# Patient Record
Sex: Female | Born: 1940 | Race: White | Hispanic: No | Smoking: Former smoker
Health system: Southern US, Community
[De-identification: ages and names within clinical notes are randomized; demographics above are authoritative.]

## PROBLEM LIST (undated history)

## (undated) DIAGNOSIS — M329 Systemic lupus erythematosus, unspecified: Secondary | ICD-10-CM

## (undated) DIAGNOSIS — J441 Chronic obstructive pulmonary disease with (acute) exacerbation: Secondary | ICD-10-CM

## (undated) DIAGNOSIS — E7800 Pure hypercholesterolemia, unspecified: Secondary | ICD-10-CM

## (undated) DIAGNOSIS — Z720 Tobacco use: Secondary | ICD-10-CM

## (undated) DIAGNOSIS — U071 COVID-19: Secondary | ICD-10-CM

## (undated) DIAGNOSIS — I10 Essential (primary) hypertension: Secondary | ICD-10-CM

## (undated) DIAGNOSIS — IMO0002 Reserved for concepts with insufficient information to code with codable children: Secondary | ICD-10-CM

## (undated) DIAGNOSIS — K5792 Diverticulitis of intestine, part unspecified, without perforation or abscess without bleeding: Secondary | ICD-10-CM

## (undated) DIAGNOSIS — M810 Age-related osteoporosis without current pathological fracture: Secondary | ICD-10-CM

## (undated) HISTORY — PX: OTHER SURGICAL HISTORY: SHX169

## (undated) HISTORY — DX: Chronic obstructive pulmonary disease with (acute) exacerbation: J44.1

## (undated) HISTORY — DX: Reserved for concepts with insufficient information to code with codable children: IMO0002

## (undated) HISTORY — DX: Essential (primary) hypertension: I10

## (undated) HISTORY — DX: Tobacco use: Z72.0

## (undated) HISTORY — DX: Age-related osteoporosis without current pathological fracture: M81.0

## (undated) HISTORY — DX: Diverticulitis of intestine, part unspecified, without perforation or abscess without bleeding: K57.92

## (undated) HISTORY — PX: CATARACT EXTRACTION: SUR2

## (undated) HISTORY — DX: Systemic lupus erythematosus, unspecified: M32.9

## (undated) HISTORY — DX: Pure hypercholesterolemia, unspecified: E78.00

---
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
## 1969-12-27 HISTORY — PX: ABDOMINAL HYSTERECTOMY: SHX81

## 1995-08-30 HISTORY — PX: CHOLECYSTECTOMY: SHX55

## 2001-07-27 ENCOUNTER — Emergency Department (HOSPITAL_COMMUNITY): Admission: EM | Admit: 2001-07-27 | Discharge: 2001-07-27 | Payer: Self-pay | Admitting: *Deleted

## 2011-02-08 ENCOUNTER — Ambulatory Visit: Payer: Self-pay | Admitting: Unknown Physician Specialty

## 2011-02-28 ENCOUNTER — Ambulatory Visit: Payer: Self-pay | Admitting: Unknown Physician Specialty

## 2011-03-08 ENCOUNTER — Ambulatory Visit: Payer: Self-pay | Admitting: Unknown Physician Specialty

## 2011-08-30 HISTORY — PX: LAPAROSCOPIC SIGMOID COLECTOMY: SHX5928

## 2012-02-17 ENCOUNTER — Inpatient Hospital Stay: Payer: Self-pay | Admitting: Surgery

## 2012-02-17 LAB — COMPREHENSIVE METABOLIC PANEL
Albumin: 3.3 g/dL — ABNORMAL LOW (ref 3.4–5.0)
Alkaline Phosphatase: 71 U/L (ref 50–136)
Anion Gap: 4 — ABNORMAL LOW (ref 7–16)
BUN: 11 mg/dL (ref 7–18)
Bilirubin,Total: 0.5 mg/dL (ref 0.2–1.0)
Calcium, Total: 8.7 mg/dL (ref 8.5–10.1)
Chloride: 99 mmol/L (ref 98–107)
Co2: 29 mmol/L (ref 21–32)
Creatinine: 0.78 mg/dL (ref 0.60–1.30)
EGFR (Non-African Amer.): >60
Glucose: 89 mg/dL (ref 65–99)
Potassium: 3.3 mmol/L — ABNORMAL LOW (ref 3.5–5.1)
SGPT (ALT): 17 U/L

## 2012-02-17 LAB — CBC
HGB: 13.7 g/dL (ref 12.0–16.0)
MCH: 32.3 pg (ref 26.0–34.0)
MCHC: 33.8 g/dL (ref 32.0–36.0)
Platelet: 343 10*3/uL (ref 150–440)
RBC: 4.24 10*6/uL (ref 3.80–5.20)
RDW: 13.1 % (ref 11.5–14.5)
WBC: 12 10*3/uL — ABNORMAL HIGH (ref 3.6–11.0)

## 2012-02-17 LAB — URINALYSIS, COMPLETE
Blood: NEGATIVE
Glucose,UR: NEGATIVE mg/dL (ref 0–75)
Ketone: NEGATIVE
Nitrite: POSITIVE
Ph: 5 (ref 4.5–8.0)
Specific Gravity: 1.028 (ref 1.003–1.030)
Squamous Epithelial: 4
WBC UR: 3 /HPF (ref 0–5)

## 2012-02-17 LAB — LIPASE, BLOOD: Lipase: 61 U/L — ABNORMAL LOW (ref 73–393)

## 2012-02-18 LAB — CBC WITH DIFFERENTIAL/PLATELET
Basophil #: 0.1 10*3/uL (ref 0.0–0.1)
HCT: 32.3 % — ABNORMAL LOW (ref 35.0–47.0)
HGB: 11.3 g/dL — ABNORMAL LOW (ref 12.0–16.0)
Lymphocyte %: 17.4 %
MCH: 33 pg (ref 26.0–34.0)
MCHC: 35 g/dL (ref 32.0–36.0)
MCV: 94 fL (ref 80–100)
Monocyte %: 9.2 %
Neutrophil %: 71.8 %
RDW: 13 % (ref 11.5–14.5)
WBC: 10.5 10*3/uL (ref 3.6–11.0)

## 2012-02-18 LAB — BASIC METABOLIC PANEL
Anion Gap: 7 (ref 7–16)
Co2: 24 mmol/L (ref 21–32)
Creatinine: 0.6 mg/dL (ref 0.60–1.30)
EGFR (Non-African Amer.): >60
Glucose: 72 mg/dL (ref 65–99)
Sodium: 131 mmol/L — ABNORMAL LOW (ref 136–145)

## 2012-02-18 LAB — PROTIME-INR
INR: 0.9
Prothrombin Time: 12.5 secs (ref 11.5–14.7)

## 2012-02-20 LAB — CBC WITH DIFFERENTIAL/PLATELET
Eosinophil #: 0.2 10*3/uL (ref 0.0–0.7)
HCT: 33.4 % — ABNORMAL LOW (ref 35.0–47.0)
HGB: 11.6 g/dL — ABNORMAL LOW (ref 12.0–16.0)
Lymphocyte #: 1.4 10*3/uL (ref 1.0–3.6)
Lymphocyte %: 17.1 %
MCH: 32.7 pg (ref 26.0–34.0)
Monocyte #: 0.9 x10 3/mm (ref 0.2–0.9)
Monocyte %: 11 %
Neutrophil %: 69.2 %
RBC: 3.55 10*6/uL — ABNORMAL LOW (ref 3.80–5.20)
WBC: 8.4 10*3/uL (ref 3.6–11.0)

## 2012-02-20 LAB — BASIC METABOLIC PANEL
Calcium, Total: 8.4 mg/dL — ABNORMAL LOW (ref 8.5–10.1)
Creatinine: 0.62 mg/dL (ref 0.60–1.30)
EGFR (African American): >60
EGFR (Non-African Amer.): >60
Glucose: 89 mg/dL (ref 65–99)
Potassium: 2.9 mmol/L — ABNORMAL LOW (ref 3.5–5.1)
Sodium: 134 mmol/L — ABNORMAL LOW (ref 136–145)

## 2012-02-22 LAB — BASIC METABOLIC PANEL
Anion Gap: 10 (ref 7–16)
Creatinine: 0.65 mg/dL (ref 0.60–1.30)
EGFR (African American): >60
EGFR (Non-African Amer.): >60
Osmolality: 268 (ref 275–301)
Sodium: 136 mmol/L (ref 136–145)

## 2012-02-22 LAB — CBC WITH DIFFERENTIAL/PLATELET
Basophil #: 0.1 10*3/uL (ref 0.0–0.1)
Basophil %: 0.7 %
Eosinophil #: 0.2 10*3/uL (ref 0.0–0.7)
Eosinophil %: 2.4 %
HGB: 11.4 g/dL — ABNORMAL LOW (ref 12.0–16.0)
Lymphocyte #: 1.4 10*3/uL (ref 1.0–3.6)
MCH: 32.2 pg (ref 26.0–34.0)
MCHC: 34.1 g/dL (ref 32.0–36.0)
MCV: 95 fL (ref 80–100)
Monocyte %: 11.6 %
Neutrophil %: 70.9 %
RBC: 3.52 10*6/uL — ABNORMAL LOW (ref 3.80–5.20)
RDW: 13.3 % (ref 11.5–14.5)

## 2012-03-08 ENCOUNTER — Ambulatory Visit: Payer: Self-pay | Admitting: Surgery

## 2012-04-03 ENCOUNTER — Ambulatory Visit: Payer: Self-pay | Admitting: Surgery

## 2012-04-03 LAB — CBC WITH DIFFERENTIAL/PLATELET
Basophil #: 0.1 10*3/uL (ref 0.0–0.1)
Basophil %: 1 %
Eosinophil %: 0.6 %
HCT: 41.3 % (ref 35.0–47.0)
HGB: 14.3 g/dL (ref 12.0–16.0)
Lymphocyte #: 2 10*3/uL (ref 1.0–3.6)
Lymphocyte %: 28.8 %
MCH: 33 pg (ref 26.0–34.0)
MCHC: 34.7 g/dL (ref 32.0–36.0)
MCV: 95 fL (ref 80–100)
Monocyte %: 11.8 %
Neutrophil #: 4 10*3/uL (ref 1.4–6.5)
Neutrophil %: 57.8 %
RBC: 4.35 10*6/uL (ref 3.80–5.20)
WBC: 7 10*3/uL (ref 3.6–11.0)

## 2012-04-03 LAB — BASIC METABOLIC PANEL
Calcium, Total: 9.7 mg/dL (ref 8.5–10.1)
Chloride: 98 mmol/L (ref 98–107)
Co2: 28 mmol/L (ref 21–32)
Creatinine: 0.59 mg/dL — ABNORMAL LOW (ref 0.60–1.30)
EGFR (Non-African Amer.): >60
Glucose: 81 mg/dL (ref 65–99)
Potassium: 3.3 mmol/L — ABNORMAL LOW (ref 3.5–5.1)
Sodium: 135 mmol/L — ABNORMAL LOW (ref 136–145)

## 2012-04-05 ENCOUNTER — Ambulatory Visit: Payer: Self-pay | Admitting: Surgery

## 2012-04-05 LAB — HEPATIC FUNCTION PANEL A (ARMC)
Albumin: 3.2 g/dL — ABNORMAL LOW (ref 3.4–5.0)
Alkaline Phosphatase: 54 U/L (ref 50–136)
Bilirubin, Direct: 0.1 mg/dL (ref 0.00–0.20)
Bilirubin,Total: 0.4 mg/dL (ref 0.2–1.0)
SGPT (ALT): 23 U/L (ref 12–78)
Total Protein: 8.3 g/dL — ABNORMAL HIGH (ref 6.4–8.2)

## 2012-04-17 ENCOUNTER — Inpatient Hospital Stay: Payer: Self-pay | Admitting: Surgery

## 2012-04-17 LAB — POTASSIUM: Potassium: 2.9 mmol/L — ABNORMAL LOW (ref 3.5–5.1)

## 2012-04-18 LAB — CBC WITH DIFFERENTIAL/PLATELET
Basophil #: 0 10*3/uL (ref 0.0–0.1)
Eosinophil #: 0 10*3/uL (ref 0.0–0.7)
Lymphocyte #: 1.3 10*3/uL (ref 1.0–3.6)
MCH: 32.5 pg (ref 26.0–34.0)
MCHC: 34.5 g/dL (ref 32.0–36.0)
MCV: 94 fL (ref 80–100)
Monocyte #: 1.4 x10 3/mm — ABNORMAL HIGH (ref 0.2–0.9)
Neutrophil #: 16 10*3/uL — ABNORMAL HIGH (ref 1.4–6.5)
Neutrophil %: 85.3 %
Platelet: 250 10*3/uL (ref 150–440)
RDW: 14.5 % (ref 11.5–14.5)
WBC: 18.8 10*3/uL — ABNORMAL HIGH (ref 3.6–11.0)

## 2012-04-18 LAB — BASIC METABOLIC PANEL
Calcium, Total: 7.5 mg/dL — ABNORMAL LOW (ref 8.5–10.1)
Chloride: 104 mmol/L (ref 98–107)
Creatinine: 0.61 mg/dL (ref 0.60–1.30)
EGFR (Non-African Amer.): >60
Glucose: 179 mg/dL — ABNORMAL HIGH (ref 65–99)
Potassium: 4.1 mmol/L (ref 3.5–5.1)
Sodium: 137 mmol/L (ref 136–145)

## 2012-04-19 LAB — CBC WITH DIFFERENTIAL/PLATELET
Basophil #: 0 10*3/uL (ref 0.0–0.1)
Basophil %: 0.1 %
Eosinophil %: 0 %
HGB: 10.2 g/dL — ABNORMAL LOW (ref 12.0–16.0)
Lymphocyte %: 2.9 %
MCH: 33.2 pg (ref 26.0–34.0)
Monocyte #: 0.7 x10 3/mm (ref 0.2–0.9)
Monocyte %: 3.7 %
Neutrophil %: 93.3 %
Platelet: 232 10*3/uL (ref 150–440)
RBC: 3.07 10*6/uL — ABNORMAL LOW (ref 3.80–5.20)
RDW: 14.4 % (ref 11.5–14.5)
WBC: 17.9 10*3/uL — ABNORMAL HIGH (ref 3.6–11.0)

## 2012-04-19 LAB — BASIC METABOLIC PANEL
Calcium, Total: 7.7 mg/dL — ABNORMAL LOW (ref 8.5–10.1)
Chloride: 104 mmol/L (ref 98–107)
Co2: 25 mmol/L (ref 21–32)
Creatinine: 0.65 mg/dL (ref 0.60–1.30)
EGFR (African American): >60
Glucose: 172 mg/dL — ABNORMAL HIGH (ref 65–99)
Potassium: 4.5 mmol/L (ref 3.5–5.1)
Sodium: 136 mmol/L (ref 136–145)

## 2012-04-19 LAB — CK-MB: CK-MB: 0.9 ng/mL (ref 0.5–3.6)

## 2012-04-19 LAB — MAGNESIUM: Magnesium: 1.4 mg/dL — ABNORMAL LOW

## 2012-04-19 LAB — PRO B NATRIURETIC PEPTIDE: B-Type Natriuretic Peptide: 3721 pg/mL — ABNORMAL HIGH (ref 0–125)

## 2012-04-20 LAB — BASIC METABOLIC PANEL
Anion Gap: 6 — ABNORMAL LOW (ref 7–16)
Co2: 25 mmol/L (ref 21–32)
EGFR (African American): >60
Potassium: 4.2 mmol/L (ref 3.5–5.1)
Sodium: 134 mmol/L — ABNORMAL LOW (ref 136–145)

## 2012-04-20 LAB — PATHOLOGY REPORT

## 2012-04-20 LAB — CBC WITH DIFFERENTIAL/PLATELET
Basophil #: 0.1 10*3/uL (ref 0.0–0.1)
Basophil %: 0.4 %
Eosinophil #: 0 10*3/uL (ref 0.0–0.7)
HCT: 28.9 % — ABNORMAL LOW (ref 35.0–47.0)
HGB: 9 g/dL — ABNORMAL LOW (ref 12.0–16.0)
Lymphocyte #: 0.6 10*3/uL — ABNORMAL LOW (ref 1.0–3.6)
MCH: 29.8 pg (ref 26.0–34.0)
MCHC: 31.2 g/dL — ABNORMAL LOW (ref 32.0–36.0)
Monocyte #: 0.5 x10 3/mm (ref 0.2–0.9)
Monocyte %: 3 %
Neutrophil #: 16.3 10*3/uL — ABNORMAL HIGH (ref 1.4–6.5)
Neutrophil %: 92.9 %
WBC: 17.5 10*3/uL — ABNORMAL HIGH (ref 3.6–11.0)

## 2012-04-20 LAB — TROPONIN I: Troponin-I: <0.02 ng/mL

## 2012-04-20 LAB — CK-MB: CK-MB: 0.6 ng/mL (ref 0.5–3.6)

## 2012-04-20 LAB — PRO B NATRIURETIC PEPTIDE: B-Type Natriuretic Peptide: 3870 pg/mL — ABNORMAL HIGH (ref 0–125)

## 2012-04-21 LAB — BASIC METABOLIC PANEL
Anion Gap: 8 (ref 7–16)
BUN: 11 mg/dL (ref 7–18)
Chloride: 99 mmol/L (ref 98–107)
Creatinine: 0.56 mg/dL — ABNORMAL LOW (ref 0.60–1.30)
EGFR (Non-African Amer.): >60

## 2012-04-21 LAB — PRO B NATRIURETIC PEPTIDE: B-Type Natriuretic Peptide: 3439 pg/mL — ABNORMAL HIGH (ref 0–125)

## 2012-04-21 LAB — CBC WITH DIFFERENTIAL/PLATELET
Basophil #: 0 10*3/uL (ref 0.0–0.1)
Basophil %: 0.1 %
Eosinophil #: 0 10*3/uL (ref 0.0–0.7)
Eosinophil %: 0 %
HCT: 28.5 % — ABNORMAL LOW (ref 35.0–47.0)
HGB: 9.7 g/dL — ABNORMAL LOW (ref 12.0–16.0)
Lymphocyte #: 0.6 10*3/uL — ABNORMAL LOW (ref 1.0–3.6)
MCH: 32.1 pg (ref 26.0–34.0)
MCHC: 34 g/dL (ref 32.0–36.0)
MCV: 94 fL (ref 80–100)
Monocyte %: 5.8 %
Neutrophil #: 11 10*3/uL — ABNORMAL HIGH (ref 1.4–6.5)
RBC: 3.02 10*6/uL — ABNORMAL LOW (ref 3.80–5.20)
RDW: 14.2 % (ref 11.5–14.5)

## 2012-04-23 LAB — CBC WITH DIFFERENTIAL/PLATELET
Basophil #: 0 10*3/uL (ref 0.0–0.1)
HCT: 30.2 % — ABNORMAL LOW (ref 35.0–47.0)
Lymphocyte %: 6.8 %
Monocyte #: 0.7 x10 3/mm (ref 0.2–0.9)
Monocyte %: 9.9 %
Neutrophil #: 5.8 10*3/uL (ref 1.4–6.5)
Neutrophil %: 83.2 %
Platelet: 327 10*3/uL (ref 150–440)
RDW: 14.6 % — ABNORMAL HIGH (ref 11.5–14.5)
WBC: 7 10*3/uL (ref 3.6–11.0)

## 2012-04-23 LAB — BASIC METABOLIC PANEL
BUN: 13 mg/dL (ref 7–18)
Calcium, Total: 8.3 mg/dL — ABNORMAL LOW (ref 8.5–10.1)
Chloride: 91 mmol/L — ABNORMAL LOW (ref 98–107)
Co2: 32 mmol/L (ref 21–32)
Creatinine: 0.54 mg/dL — ABNORMAL LOW (ref 0.60–1.30)
EGFR (Non-African Amer.): >60
Glucose: 117 mg/dL — ABNORMAL HIGH (ref 65–99)
Osmolality: 269 (ref 275–301)
Potassium: 2.8 mmol/L — ABNORMAL LOW (ref 3.5–5.1)

## 2012-06-04 LAB — EXPECTORATED SPUTUM ASSESSMENT W GRAM STAIN, RFLX TO RESP C

## 2014-02-11 DIAGNOSIS — M059 Rheumatoid arthritis with rheumatoid factor, unspecified: Secondary | ICD-10-CM | POA: Insufficient documentation

## 2014-02-11 DIAGNOSIS — M8580 Other specified disorders of bone density and structure, unspecified site: Secondary | ICD-10-CM | POA: Insufficient documentation

## 2014-12-16 NOTE — Discharge Summary (Signed)
PATIENT NAME:  Donna Mcneil, Donna Mcneil MR#:  [id number] DATE OF BIRTH:  1940/09/12  DATE OF ADMISSION:  04/17/2012 DATE OF DISCHARGE:  04/24/2012  DISCHARGE DIAGNOSES:  1. Sigmoid diverticulitis.  2. Postoperative respiratory failure, possible pneumonia.  3. Hypertension. 4. Hypercholesterolemia.  5. Osteoporosis.  6. Rheumatoid arthritis.  7. Tobacco abuse.  8. Probable chronic obstructive pulmonary disease.   PROCEDURE PERFORMED: Open sigmoid colectomy.   PAST SURGICAL HISTORY:  1. Cholecystectomy.  2. Appendectomy.  3. Tonsillectomy.  4. Adenoidectomy.  5. Total abdominal hysterectomy.  6. Bilateral salpingo-oophorectomy.   DRUG ALLERGIES: Lyrica, Ambien, codeine, and Restasis.   CONSULTANTS: Cherre Huger, MD - Internal Medicine.  DISCHARGE MEDICATIONS: 1. Lipitor 20 mg p.o. daily.  2. Aspirin 81 mg p.o. daily.  3. Methotrexate 2.5 mg 6 tablets orally weekly on Mondays.  4. Hydrochlorothiazide 50 mg p.o. daily. 5. Kenalog spray. 6. ProctoFoam rectal clean with applicator twice a day as needed.  7. Zantac 150 mg p.o. daily.  8. Reclast IV solution.  9. Fluticasone 50 mcg inhaler nasal spray, two sprays once a day as needed.  10. Potassium chloride 40 mEq p.o. twice a day x6 doses and potassium chloride 99 mg 1 tab p.o. daily.  11. Calcium plus vitamin D 1 tablet p.o. twice a day.  12. Folic acid 0.8 mg p.o. daily.  13. Multivitamins.  14. Citracal tablet orally daily.  15. Fish oil 1,000 mg three tabs p.o. daily.  16. Xanax 0.25 mg p.o. every 8 hours p.r.n. anxiety. 17. Diflucan 100 mg p.o. daily x4 doses.  18. Percocet 1 to 2 tabs p.o. every 4 hours p.r.n. pain.  19. Prednisone 50 mg.  Decrease by 10 mg every 2 days until stopped.  HOSPITAL COURSE: Donna Mcneil was brought in for routine sigmoid colectomy and on postoperative day one she had respiratory issues and therefore internal medicine was consulted. She has a component of chronic obstructive pulmonary disease  and she was treated with broad-spectrum antibiotics and steroids. She slowly improved. Throughout her hospital course her diet went from n.p.o. to liquids to tolerating full diet at discharge. She had her fluids tapered throughout her hospital course and she was transitioned from IV to p.o. pain medications. At the time of discharge, she is still on 3 liters of oxygen and PT had recommended a short-term rehab facility.  DISCHARGE INSTRUCTIONS: Donna Mcneil is to followup with Dr. Burt Knack in 5 to 7 days. She is to undergo PT and OT. She is to call or return if she has increased pain, nausea, vomiting, redness, or drainage from the incision.  She is to continue to undergo steroid taper from Prednisone 50 mg to decrease by 10 mg every two days until stopped.  ____________________________ Glena Norfolk Cletus Paris, MD cal:slb D: 04/24/2012 08:50:00 ET T: 04/24/2012 11:28:09 ET JOB#: [id number]  cc: Harrell Gave A. Dionne Rossa, MD, <Dictator> Floyde Parkins MD ELECTRONICALLY SIGNED 04/25/2012 8:22

## 2014-12-16 NOTE — Op Note (Signed)
PATIENT NAME:  Donna Mcneil, Donna Mcneil MR#:  [id number] DATE OF BIRTH:  04/13/1941  DATE OF PROCEDURE:  04/17/2012  PREOPERATIVE DIAGNOSIS: Diverticulitis.   POSTOPERATIVE DIAGNOSIS: Diverticulitis.  PROCEDURE: Low anterior resection with rectosigmoid resection and low anterior anastomosis.   SURGEON: Courtni Balash E. Burt Knack, MD   ASSISTANT: Adelene Idler, PAS   ANESTHESIA: General with endotracheal tube.   INDICATIONS: This is a patient with a history of perforated diverticulitis with abscess who has resolved her abscess with IV antibiotics as well as drainage. A follow-up CT scan shows near complete resolution. She is here for elective colon resection to prevent further abscesses. Preoperatively, we discussed the rationale for this operation, the options of observation, risks of bleeding, infection, recurrent diverticulitis, abscess, anastomotic leak, ostomy. This was all reviewed for her and her family in the preop holding area. They understood and agreed to proceed.   FINDINGS: Extensive scarified hardened rectosigmoid junction at the sacral promontory with signs of prior abscess and scar. One piece of free floating corn kernel was identified in this area. The ureters were identified early and kept in view at all times.   DESCRIPTION OF PROCEDURE: The patient was induced to general anesthesia, given IV antibiotics, VTE prophylaxis was in place. She was prepped and draped in a sterile fashion. A two-fingered rectal exam was performed, and once she was prepped and draped in a sterile fashion a midline incision was utilized to open and explore the abdominal cavity. Very few adhesions were noted in the small bowel or anterior abdominal wall. These were taken down sharply. The left colon was identified. The avascular line of Toldt was taken down with electrocautery. The ureter on the left side was identified and protected. On the right side, it was identified as well. Further dissection demonstrated a very  hard indurated and scarified area at the sacral promontory somewhat on the left side. This was taken down with sharp and electrocautery dissection as well as with blunt finger dissection to elevate the section of colon that was obviously involved. A small piece of corn kernel from prior abscess cavity was identified and retrieved.   It was determined that the extent of disease extended well down into the pelvis; therefore, a low anterior resection was planned. (The patient had been placed in well-padded stirrup positions preoperatively.)   A TA-60 was fired across the rectum low in the pelvis and then further dissection utilizing electrocautery or clips was performed. Hemostasis was with the use of clips or 2-0 silk ligatures and 2-0 silk stick ties to control a small amount of hemorrhage on the sacrum.   The proximal extent of disease was identified, and clamps were placed, and division of the colon was performed at the sigmoid juncture with the descending colon. The specimen was then sent off for examination.   EEA sizers were utilized to determine the proper size EEA in the proximal colon. A 33 EEA was chosen. An anvil was sewn into the proximal colon utilizing running 3-0 Prolene.   From below, a digital rectal exam was performed again, ensuring that the rectum had been identified and not the vagina. Placement of the EEA stapler was performed, placed to the anvil and fired in a standard fashion and released. The pelvis was then filled with saline, and an Asepto syringe was insufflated transanally with no sign of air leak. The irrigant was aspirated. There was no sign of bleeding.   The anastomosis was reinforced with figure-of-eight 3-0 silk sutures in four quadrants, and again  hemostasis was found to be adequate. At this point, the sponge, lap and needle counts were correct. Therefore, a piece of Seprafilm was placed over the omentum, and the wound was closed with running #1 PDS. Marcaine was  infiltrated in skin and subcutaneous tissues for a total of 30 mL, and then skin staples were placed after irrigating the subcutaneous tissues. Sterile dressing was placed. She tolerated the procedure well. Sponge, lap and needle counts were correct. She was taken to the recovery room in stable condition to be admitted for continued care.   ____________________________ Jerrol Banana. Burt Knack, MD rec:cbb D: 04/17/2012 15:13:37 ET T: 04/17/2012 16:01:01 ET JOB#: [id number] cc: Jerrol Banana. Burt Knack, MD, <Dictator> Florene Glen MD ELECTRONICALLY SIGNED 04/21/2012 2:01

## 2014-12-16 NOTE — Consult Note (Signed)
PATIENT NAME:  Donna Mcneil, Donna Mcneil MR#:  [id number] DATE OF BIRTH:  [birth year]  DATE OF CONSULTATION:  04/18/2012  REFERRING PHYSICIAN:  Dr. Pat Patrick  CONSULTING PHYSICIAN:  Cherre Huger, MD   PRIMARY CARE PHYSICIAN: Laurian Brim, MD   REASON FOR CONSULTATION: Hypoxia, postop respiratory insufficiency.   HISTORY OF PRESENT ILLNESS: The patient is a 74 year old female with past medical history of hyperlipidemia, hypertension, osteoporosis, rheumatoid, extensive history of smoking who underwent elective rectosigmoid resection with low anterior anastomosis for diverticulitis on 04/17/2012. The patient was apparently doing well but had to be placed on 1 liter off oxygen postoperatively. She also got some fluids today because of borderline low urine output. The patient initially had oxygen saturations of 95 to 98% on 2 liters and subsequently developed hypoxia with oxygen saturation of 83% on 1 liter of oxygen. The patient was given respiratory treatments, one dose of Lasix, and placed on high flow oxygen via nasal cannula. Medicine was consulted. The patient reports extensive history of smoking. She has smoked a half pack per day for more than 50 years. She felt that she was doing well. She denied any shortness of breath, pleuritic-type chest pain, or palpitations. She does report that it is hard for her to cough and take deep breaths due to abdominal pain. She reports that she is tolerating a clear liquid diet although she has not passed any gas but she is belching.   ALLERGIES: Ambien, codeine, Lyrica, and Restasis.   CURRENT MEDICATIONS:  1. Heparin 5000 subcutaneously q.8 hours.  2. Zofran 4 mg IV q.6 hours p.r.n. nausea/vomiting.  3. Protonix 40 mg IV daily. 4. Lasix 20 mg IV one time dose. 5. Morphine PCA. 6. DuoNebs q.4 hours while awake.    PAST MEDICAL HISTORY:  1. History of diverticulitis. 2. Hyperlipidemia. 3. Hypertension. 4. Osteoporosis. 5. Rheumatoid arthritis. 6. Smoking.    PAST SURGICAL HISTORY:  1. Hysterectomy.  2. Eye surgery. 3. Cholecystectomy.  4. Appendectomy.  5. Adenoidectomy/tonsillectomy. 6. Bilateral salpingo-oophorectomy.   FAMILY HISTORY: Mother was alcoholic. Father had heart disease.   SOCIAL HISTORY: She has been smoking half pack per day for more than 50 years. Denies any alcohol or drug abuse. She lives alone.   REVIEW OF SYSTEMS: CONSTITUTIONAL: Denies any fever, fatigue, or weakness. EYES: Denies any blurred or double vision. ENT: Denies any tinnitus or ear pain. RESPIRATORY: Denies any chest pain, wheezing. CARDIOVASCULAR: Denies any palpitations, syncope. GI: Reports abdominal pain. Denies any nausea or vomiting. GU: Denies any dysuria or hematuria. Has a Foley catheter in place. INTEGUMENTARY: Denies any acne, rash. MUSCULOSKELETAL: Denies any swelling, gout. Has history of rheumatoid arthritis. NEUROLOGIC: Denies any numbness, weakness. PSYCH: Reports anxiety. Denies depression.   PHYSICAL EXAMINATION:   VITAL SIGNS: Temperature 98.8, heart rate 106, respiratory rate 18, blood pressure 107/70, pulse oximetry 83% on 1 liter. She is 94% on 50% high flow nasal cannula at present.   GENERAL: The patient is a 74 year old Caucasian female laying comfortably in bed. She is not in acute respiratory distress.   HEAD: Atraumatic, normocephalic.   EYES: There is some pallor. No icterus or cyanosis. Pupils equal, round, and reactive to light and accommodation. Extraocular movements intact.    ENT: Dry mucous membranes. No oropharyngeal erythema or thrush.   NECK: Supple. No masses. No JVD. No thyromegaly. No lymphadenopathy.   CHEST WALL: No tenderness to palpation. Not using accessory muscles for respiration. No intercostal muscle retraction.   LUNGS: Bilaterally reduced breath sounds especially at the  bases. No wheezing or rhonchi.   CARDIOVASCULAR: S1, S2 regular. No murmur, rubs, or gallops.   ABDOMEN: Soft, mildly distended. It  is tender to palpation. Very hypoactive bowel sounds.   SKIN: No rashes or lesions.   PERIPHERIES: No pedal edema. 2+ pedal pulses.   MUSCULOSKELETAL: No cyanosis or clubbing.   NEUROLOGIC: Awake, alert, oriented x3. Nonfocal neurological exam. Cranial nerves grossly intact.   PSYCH: Normal mood and affect.   LABORATORY, DIAGNOSTIC, AND RADIOLOGICAL DATA: Chest x-ray showed left lower lobe atelectasis.   ABG showed pH 7.44, pCO2 33, pO2 54, FiO2 40%. White count 18.8, hemoglobin 10.7, hematocrit 31.1, platelet count 250, glucose 179, BUN 8, creatinine 0.61, sodium 137, potassium 4.1, chloride 102, CO2 25, calcium 7.5.   ASSESSMENT AND PLAN: This is a 74 year old female with history of hypertension, osteoporosis, rheumatoid arthritis, and smoking who underwent elective sigmoid resection on 04/17/2012 for diverticulitis. Medicine is consulted for postop respiratory insufficiency and hypoxia. 1. Acute hypoxic respiratory failure possibly due to underlying COPD given extensive history of smoking and atelectasis due to abdominal splinting. The patient is not taking deep breaths due to abdominal pain. She reports that she is trying to cough but that is also painful for her. There is low clinical suspicion for PE because the patient denies any shortness of breath or pleuritic-type chest pain. She is mildly tachycardic but this could be related to anxiety and also nicotine withdrawal since she was taking Xanax at home and is a long-term smoker. She does not appear to be fluid overloaded at present. Will check a BNP and echo. Will give one dose of IV Solu-Medrol. Continue nebulizer treatments. Will add budesonide nebulizers. The patient has been encouraged to use incentive spirometry. Will start her on p.r.n. Xanax. Place her on a nicotine patch. Will consider antibiotics if she becomes febrile or her white count goes up to prevent development of pneumonia.  2. Hyperlipidemia. The patient's statin is  currently on hold since she is only on a clear liquid diet.  3. History of hypertension. The patient is off any medications and it is well controlled at present.  4. Extensive history of smoking. The patient was counseled about cessation for more than three minutes. Will provide her with a nicotine patch.   Reviewed old medical records, discussed with Dr. Pat Patrick, discussed with the patient and her family the plan of care and management.   TIME SPENT: 75 minutes.   ____________________________ Cherre Huger, MD sp:drc D: 04/18/2012 18:55:14 ET T: 04/19/2012 09:36:53 ET JOB#: [id number]  cc: Cherre Huger, MD, <Dictator> Cherre Huger MD ELECTRONICALLY SIGNED 04/21/2012 14:45

## 2014-12-16 NOTE — H&P (Signed)
PATIENT NAME:  Donna Mcneil, Donna Mcneil MR#:  [id number] DATE OF BIRTH:  11-Nov-1940  DATE OF ADMISSION:  04/17/2012  CHIEF COMPLAINT: Diverticulitis.   HISTORY OF PRESENT ILLNESS: This is a patient with a diagnosed history of diverticulitis. She had a perforated diverticula back in June of 2013 with an abscess. That abscess was treated with IV antibiotics and she improved considerably and was sent home on oral antibiotics. She has been seen in the office subsequently and has been treated with oral antibiotics and a repeat CT scan shows near complete resolution of the abscess cavity. She is here for elective sigmoid colon resection.  She has had no recent fevers or chills. Has had some loose stools but overall is doing well and ready for surgery. She continues to smoke, however.   PAST MEDICAL HISTORY:  1. Hypertension.  2. Hypercholesterolemia.  3. Osteoporosis.  4. Rheumatoid arthritis. 5. Tobacco abuse.  6. Denies COPD but likely has some component of it.   PAST SURGICAL HISTORY:  1. Cholecystectomy.  2. Appendectomy.  3. Tonsillectomy.  4. Adenoidectomy.  5. Total abdominal hysterectomy. 6. Bilateral salpingo-oophorectomy.   ALLERGIES: Lyrica, Ambien, codeine, and Restasis.   FAMILY HISTORY: Noncontributory.   SOCIAL HISTORY: The patient smokes tobacco. Does not drink alcohol. Retired from [employer]: 10 system review has been performed and negative and is documented in the office chart and on previous H's as well.   PHYSICAL EXAMINATION:   GENERAL: Healthy, comfortable-appearing female patient.   HEENT: No scleral icterus.   NECK: No palpable neck nodes.   CHEST: Clear to auscultation.   CARDIAC: Regular rate and rhythm.   ABDOMEN: Soft, nondistended, nontender.   EXTREMITIES: Without edema. Calves are nontender.   NEUROLOGIC: Grossly intact.   INTEGUMENTARY: No jaundice.  LABORATORY, DIAGNOSTIC, AND RADIOLOGICAL DATA: Most recent lab reports from  August 8th demonstrate normal liver function tests, albumin 3.2, prealbumin 37, creatinine 0.59, potassium 3.3, white blood cell count 7.0, hemoglobin and hematocrit 14 and 41, and a platelet count of 337.   ASSESSMENT AND PLAN: This is a patient with documented perforated diverticulitis which has resolved on oral antibiotics following prolonged hospitalization with IV antibiotics. She is here for elective colon resection. She has rheumatoid arthritis and has not been able to take her immune modulators due to the risk of infection and for that reason she requires expedited colon resection.    The rationale for offering surgery has been discussed with her, the option of observation have been reviewed, the risks of bleeding, infection, recurrence of diverticula, recurrence of symptoms, possibility of colostomy, and the risks of anastomotic leak necessitating additional surgery or drainage were all reviewed with her. She understood and agreed to proceed.   ____________________________ Jerrol Banana Burt Knack, MD rec:drc D: 04/17/2012 08:48:27 ET T: 04/17/2012 09:35:33 ET JOB#: [id number]  cc: Jerrol Banana. Burt Knack, MD, <Dictator> Florene Glen MD ELECTRONICALLY SIGNED 04/17/2012 15:34

## 2014-12-16 NOTE — Discharge Summary (Signed)
PATIENT NAME:  Donna Mcneil, Donna Mcneil MR#:  [id number] DATE OF BIRTH:  19-Dec-1940  DATE OF ADMISSION:  04/17/2012 DATE OF DISCHARGE:  04/25/2012  ADDENDUM  Donna Mcneil did not get discharged yesterday due to the fact that she was still having some symptomatic difficulties breathing. She appears more comfortable and with less labored breathing this morning, is only on 1 liter of oxygen. She will be discharged to a skilled nursing facility when available. She is to follow up in approximately one week with Dr. Burt Knack if possible at one of our [facility] or [facility].   ____________________________ Glena Norfolk Donna Lacerte, MD cal:cms D: 04/25/2012 08:27:12 ET T: 04/25/2012 08:39:06 ET JOB#: [id number]  cc: Harrell Gave A. Cathyann Kilfoyle, MD, <Dictator> Floyde Parkins MD ELECTRONICALLY SIGNED 04/27/2012 19:56

## 2014-12-21 NOTE — Discharge Summary (Signed)
PATIENT NAME:  Donna Mcneil, Donna Mcneil MR#:  [id number] DATE OF BIRTH:  [birth year]  DATE OF ADMISSION:  02/17/2012 DATE OF DISCHARGE:  02/22/2012  DISCHARGE DIAGNOSES:  1. Hypertension. 2. Hypercholesterolemia. 3. Osteoporosis. 4. Rheumatoid arthritis. 5. Perforated diverticulitis with abscess.   PROCEDURES: None.   HISTORY OF PRESENT ILLNESS/HOSPITAL COURSE: This is a patient with approximately 2 to 3 weeks of abdominal pain which had gradually worsened and some low back pain. I was seeing the patient in the Emergency Room where work-up had shown a pelvic abscess. She was placed in the hospital on IV antibiotics and scheduled for a CT-guided drainage at which time the next day when the drainage was to be performed she was placed in the prone position and a CT scan was performed as a scout for intervention. Dr. Owens Shark had called me and I came down to the radiology and reviewed the films with him while the patient was in the prone position and it was noted that the abscess cavity had decreased in size drastically overnight suggesting transrectal spontaneous necessitation or drainage. Because of that finding the patient was continued in the hospital on IV antibiotics and her pain improved considerably, her white blood cell count normalized. She is currently tolerating a regular diet and wishes to be discharged. She will be sent home on p.o. antibiotics, Cipro and Flagyl, and oral analgesics to follow up in my office next week and consider repeating CT scan. She will require resection at some point but it would be optimal if we could treat this and decrease the amount of inflammation and infection and do a single-stage operation in this patient once she is better. She understands these plans. She also understands to return to my office or to the ED if she has worsening pain or fevers.   ____________________________ Jerrol Banana Burt Knack, MD rec:cms D: 02/22/2012 10:39:33 ET T: 02/23/2012 11:17:53  ET  JOB#: [id number] cc: Jerrol Banana. Burt Knack, MD, <Dictator>  Florene Glen MD ELECTRONICALLY SIGNED 02/23/2012 17:40

## 2014-12-21 NOTE — H&P (Signed)
PATIENT NAME:  Donna Mcneil, Donna Mcneil MR#:  [id number] DATE OF BIRTH:  [birth year]  DATE OF ADMISSION:  02/17/2012  CHIEF COMPLAINT: Lower abdominal pain.   HISTORY OF PRESENT ILLNESS: This is a patient with two weeks of abdominal pain, actually started 13 days ago. She has had no nausea or vomiting. Has had slight fevers but did not take her temperature so she is not sure. She's had an episode like this before, was told that she at first had diverticulitis but then was told she had some kind of enteritis. Her pain seemed to be worsening so she came to the Emergency Room. She has had a colonoscopy but it was 20 years ago.   I was called from the Emergency Room and spoke with Dr. Ulice Brilliant personally who had described the CT findings suggestive of a pelvic abscess consistent with diverticulitis.   PAST MEDICAL HISTORY:  1. Hypertension. 2. Hypercholesterolemia. 3. Osteoporosis. 4. Rheumatoid arthritis. 5. Denies chronic obstructive pulmonary disease or heart disease.   PAST SURGICAL HISTORY:  1. Cholecystectomy.  2. Appendectomy.  3. Tonsillectomy.  4. Adenoidectomy.  5. TAH, BSO.   ALLERGIES: Lyrica, Ambien, codeine, and Restasis.   FAMILY HISTORY: Noncontributory.   SOCIAL HISTORY: The patient smokes tobacco daily. Does not drink much alcohol. Is retired from [employer]. 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  REVIEW OF SYSTEMS: 10 system review has been performed and negative with the exception of that mentioned in the history of present illness.   MEDICATIONS: Multiple, see chart.   PHYSICAL EXAMINATION:   GENERAL: Healthy, comfortable appearing Caucasian female patient.   VITALS: Temperature 99.1, pulse 64, respirations 20, blood pressure 115/59. Pain scale 9.   HEENT: No scleral icterus.   NECK: No palpable neck nodes. The patient smells of smoke.   CARDIAC: Regular rate and rhythm.   CHEST: Clear to auscultation.   ABDOMEN: Slightly distended. Scars are noted  infraumbilical, right lower quadrant, laparoscopy scars. She is tender in both lower quadrants more on the left side. No peritoneal signs. No guarding or rebound or percussion tenderness.   EXTREMITIES: Without edema. Calves are nontender.   NEUROLOGIC: Grossly intact.   INTEGUMENTARY: No jaundice.   LABORATORY, DIAGNOSTIC, AND RADIOLOGICAL DATA: White blood cell count of 12, hemoglobin and hematocrit of 13.7 and 40, and a platelet count of 343, potassium 3.3, sodium 132. Urinalysis is clean with the exception of some positive nitrites.   CT scan is personally reviewed and reviewed with Dr. Register suggesting a pelvic abscess over 5 cm.   ASSESSMENT AND PLAN: This is a patient with a pelvic abscess likely secondary to perforated diverticulitis. She has been on antibiotics being treated for urinary tract infection for two weeks, fluoroquinolones have been utilized. My recommendations are for admission to the hospital, hydration, and IV antibiotics. I discussed and reviewed the films with Dr. Register who was in agreement that a transgluteal approach could be done quite easily in this rather large abscess for CT-guided drainage. He suggested placing this patient on the schedule and being it late in the afternoon will schedule it for in the morning and order coags. I have discussed this with the Radiology Tech and Specials as well and will proceed in the morning with CT-guided drainage. The patient was understanding of this and agreed to proceed. Family was present during this discussion.   ____________________________ Jerrol Banana Burt Knack, MD rec:drc D: 02/17/2012 16:36:11 ET T: 02/17/2012 17:04:03 ET JOB#: [id number]  cc: Jerrol Banana. Burt Knack, MD, <Dictator>  Delfino Lovett  Melchor Amour MD ELECTRONICALLY SIGNED 02/19/2012 9:59

## 2014-12-21 NOTE — H&P (Signed)
Subjective/Chief Complaint lower abd pain    History of Present Illness 2 weeks, on abx for uti no f/c, no nausea nml  BM prior episode of diverticulitis    Past History RA, tob abuse, HTN, hpyrcholest PSH TAH/BSO, appy T&A, GB    Past Medical Health Hypertension, Smoking   Past Med/Surgical Hx:  HTN:   Hypercholesterolemia:   Osteoporosis:   Rheumatoid Arthritis:   Cholecystectomy:   Appendectomy:   Tonsillectomy:   ALLERGIES:  Lyrica: Agitation  Ambien: Itching  Codeine: GI Distress  Restasis: Unknown  Family and Social History:   Family History Non-Contributory    Social History positive  tobacco, negative ETOH, retired, Psychologist, clinical    + Tobacco Current (within 1 year)    Place of Living Home   Review of Systems:   Fever/Chills No    Cough No    Abdominal Pain Yes    Diarrhea No    Constipation No    Nausea/Vomiting No    SOB/DOE No    Chest Pain No    Dysuria No    Tolerating Diet Yes   Physical Exam:   GEN no acute distress    HEENT pink conjunctivae    NECK supple    RESP normal resp effort  clear BS    CARD regular rate    ABD positive tenderness  soft  distended  no peritoneal signs    LYMPH negative neck    EXTR negative edema    SKIN normal to palpation    PSYCH alert, A+O to time, place, person, good insight   Lab Results: Hepatic:  21-Jun-13 11:42    Bilirubin, Total 0.5   Alkaline Phosphatase 71   SGPT (ALT) 17 (12-78 NOTE: NEW REFERENCE RANGE 07/22/2011)   SGOT (AST) 19   Total Protein, Serum 8.2   Albumin, Serum  3.3  Routine Chem:  21-Jun-13 11:42    Glucose, Serum 89   BUN 11   Creatinine (comp) 0.78   Sodium, Serum  132   Potassium, Serum  3.3   Chloride, Serum 99   CO2, Serum 29   Calcium (Total), Serum 8.7   Osmolality (calc) 263   eGFR (African American) >60   eGFR (Non-African American) >60 (eGFR values <19m/min/1.73 m2 may be an indication of chronic kidney disease  (CKD). Calculated eGFR is useful in patients with stable renal function. The eGFR calculation will not be reliable in acutely ill patients when serum creatinine is changing rapidly. It is not useful in  patients on dialysis. The eGFR calculation may not be applicable to patients at the low and high extremes of body sizes, pregnant women, and vegetarians.)   Anion Gap  4   Lipase  61 (Result(s) reported on 17 Feb 2012 at 12:50PM.)  Routine UA:  21-Jun-13 11:42    Color (UA) Red   Clarity (UA) Cloudy   Glucose (UA) Negative   Bilirubin (UA) Negative   Ketones (UA) Negative   Specific Gravity (UA) 1.028   Blood (UA) Negative   pH (UA) 5.0   Protein (UA) Negative   Nitrite (UA) Positive   Leukocyte Esterase (UA) Negative (Result(s) reported on 17 Feb 2012 at 12:21PM.)   RBC (UA) 2 /HPF   WBC (UA) 3 /HPF   Bacteria (UA) TRACE   Epithelial Cells (UA) 4 /HPF   Mucous (UA) PRESENT (Result(s) reported on 17 Feb 2012 at 12:21PM.)  Routine Hem:  21-Jun-13 11:42    WBC (CBC)  12.0  RBC (CBC) 4.24   Hemoglobin (CBC) 13.7   Hematocrit (CBC) 40.4   Platelet Count (CBC) 343 (Result(s) reported on 17 Feb 2012 at 12:43PM.)   MCV 95   MCH 32.3   MCHC 33.8   RDW 13.1   Radiology Results: CT:    21-Jun-13 14:11, CT Abdomen and Pelvis With Contrast   CT Abdomen and Pelvis With Contrast   REASON FOR EXAM:    (1) LLQ pain n/v; (2) LLQ pain n/v  COMMENTS:       PROCEDURE: CT  - CT ABDOMEN / PELVIS  W  - Feb 17 2012  2:11PM     RESULT: History: Pain.    Comparison Study: No recent.    Findings: Standard CT obtained 75 cc of Isovue-300. Liver normal. Prior   cholecystectomy. Common bile duct is distended to 1.9 cm. Debris within   the common bile duct cannot be excluded. There is mild intrahepatic   biliary distention. M.R.C.P. should be considered for further evaluation.   Mild duodenal distention noted. No focal pancreatic lesion identified.   Spleen is normal. Adrenals normal.  Kidneys normal. No hydronephrosis.     Aorta nondistended. Prior appendectomy. There is a 5.5 cm complex fluid   collection in the left lower pelvis a left lateral aspect of the sigmoid   colon. Adjacent bowel wall thickening noted. These changes suggest the   possibility of abscess secondary to a process such as diverticulitis.   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 The possibility of malignancy   cannot be excluded. Phleboliths are present. No bowel obstruction or free   air. Lung bases clear.    IMPRESSION:   1. 5.5 cm fluid collection the left lower pelvis associated with the a   sigmoid colon. Associated sigmoid colonic diverticulosis noted. These   findings are consistent with a diverticular abscess.  2. Prior cholecystectomy. The a common bile duct is prominently   distended. This may be from prior cholecystectomy. Small amount of debris   in the distal common bile duct cannot beexcluded. Evaluation with     M.R.C.P. suggested.    Report phoned to Dr. Ulice Brilliant at time of study.          Verified By: Osa Craver, M.D., MD     Assessment/Admission Diagnosis perf divertic, abscess discussed with Dr Register plan admit IV abx CT drainage in am, discussed with Dr Register will check coags plan rev'd with pt and family   Electronic Signatures: Florene Glen (MD)  (Signed 21-Jun-13 15:34)  Authored: CHIEF COMPLAINT and HISTORY, PAST MEDICAL/SURGIAL HISTORY, ALLERGIES, FAMILY AND SOCIAL HISTORY, REVIEW OF SYSTEMS, PHYSICAL EXAM, LABS, Radiology, ASSESSMENT AND PLAN   Last Updated: 21-Jun-13 15:34 by Florene Glen (MD)

## 2015-07-27 LAB — HM DEXA SCAN

## 2015-09-29 ENCOUNTER — Telehealth: Payer: Self-pay | Admitting: Family Medicine

## 2015-09-29 NOTE — Telephone Encounter (Signed)
Please advise.   Patient is not in [employer]

## 2015-09-29 NOTE — Telephone Encounter (Signed)
I don't remember the name, but, if she is a past patient, may schedule appointment.

## 2015-09-29 NOTE — Telephone Encounter (Signed)
Pt stated she was a pt of [facility] several years ago and wanted to re-establish. Pt was seeing Dr. Hardin Negus and that practice is now closed. Pt has Medicare and would like to come in next week if possible. Can we re-establish? Please advise. Thanks TNP

## 2015-09-30 NOTE — Telephone Encounter (Signed)
Pt coming in on Monday 2/6

## 2015-10-05 ENCOUNTER — Encounter: Payer: Self-pay | Admitting: Family Medicine

## 2015-10-05 ENCOUNTER — Other Ambulatory Visit: Payer: Self-pay

## 2015-10-05 ENCOUNTER — Ambulatory Visit (INDEPENDENT_AMBULATORY_CARE_PROVIDER_SITE_OTHER): Payer: Medicare Other | Admitting: Family Medicine

## 2015-10-05 VITALS — BP 174/102 | HR 84 | Temp 97.9°F | Resp 16 | Ht 63.5 in | Wt 156.0 lb

## 2015-10-05 DIAGNOSIS — K219 Gastro-esophageal reflux disease without esophagitis: Secondary | ICD-10-CM | POA: Diagnosis not present

## 2015-10-05 DIAGNOSIS — K279 Peptic ulcer, site unspecified, unspecified as acute or chronic, without hemorrhage or perforation: Secondary | ICD-10-CM | POA: Insufficient documentation

## 2015-10-05 DIAGNOSIS — M059 Rheumatoid arthritis with rheumatoid factor, unspecified: Secondary | ICD-10-CM

## 2015-10-05 DIAGNOSIS — Z7189 Other specified counseling: Secondary | ICD-10-CM | POA: Diagnosis not present

## 2015-10-05 DIAGNOSIS — M779 Enthesopathy, unspecified: Secondary | ICD-10-CM | POA: Insufficient documentation

## 2015-10-05 DIAGNOSIS — M810 Age-related osteoporosis without current pathological fracture: Secondary | ICD-10-CM | POA: Diagnosis not present

## 2015-10-05 DIAGNOSIS — H269 Unspecified cataract: Secondary | ICD-10-CM | POA: Insufficient documentation

## 2015-10-05 DIAGNOSIS — M1990 Unspecified osteoarthritis, unspecified site: Secondary | ICD-10-CM

## 2015-10-05 DIAGNOSIS — Z7689 Persons encountering health services in other specified circumstances: Secondary | ICD-10-CM

## 2015-10-05 DIAGNOSIS — I10 Essential (primary) hypertension: Secondary | ICD-10-CM

## 2015-10-05 DIAGNOSIS — E785 Hyperlipidemia, unspecified: Secondary | ICD-10-CM | POA: Diagnosis not present

## 2015-10-05 NOTE — Progress Notes (Signed)
Patient ID: ROBYNNE ROAT, female   DOB: February 18, 1941, 75 y.o.   MRN: [id number]       Patient: Donna Mcneil, Female    DOB: 1941/04/22, 75 y.o.   MRN: [id number] Visit Date: 10/05/2015  Today's Provider: Vernie Murders, PA   Chief Complaint  Patient presents with  . Establish Care   Subjective:   Donna Mcneil is a 75 y.o. female who presents today to be established in this practice.  She feels fairly well. She reports exercising sometimes walking. She reports she is sleeping well (average 7.5 hours with Alprazolam) .  -----------------------------------------------------------------   Review of Systems  Constitutional: Positive for appetite change.  HENT: Positive for congestion, drooling, nosebleeds, postnasal drip, sinus pressure, sneezing and voice change.   Eyes: Positive for itching.  Respiratory: Positive for cough, shortness of breath and wheezing.   Cardiovascular: Positive for leg swelling.  Gastrointestinal: Positive for abdominal distention.  Endocrine: Positive for cold intolerance, heat intolerance and polyphagia.  Genitourinary: Negative.   Musculoskeletal: Positive for myalgias, back pain, joint swelling, arthralgias, neck pain and neck stiffness.  Skin: Positive for rash.  Allergic/Immunologic: Positive for environmental allergies.  Neurological: Positive for dizziness, facial asymmetry, weakness, numbness and headaches.  Psychiatric/Behavioral: Positive for sleep disturbance and dysphoric mood. The patient is nervous/anxious.     Social History      She  reports that she has been smoking Cigarettes.  She has been smoking about 0.50 packs per day for the past 60 years. She has never used smokeless tobacco. She reports that she does not drink alcohol or use illicit drugs.       Social History   Social History  . Marital Status: Legally Separated    Spouse Name: N/A  . Number of Children: N/A  . Years of Education: N/A   Social History  Main Topics  . Smoking status: Current Every Day Smoker -- 0.50 packs/day    Types: Cigarettes  . Smokeless tobacco: Never Used  . Alcohol Use: No  . Drug Use: No  . Sexual Activity: Not Asked   Other Topics Concern  . None   Social History Narrative  . None    Patient Active Problem List   Diagnosis Date Noted  . Osteoarthritis 10/05/2015  . Hyperlipidemia 10/05/2015  . Hypertension 10/05/2015  . Osteoporosis 10/05/2015  . Acid reflux 10/05/2015  . Peptic ulcer 10/05/2015  . Cataract 10/05/2015  . Tendinitis 10/05/2015  . Osteopenia 02/11/2014  . 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 Tonsillectomy  1945  . Appendectomy  1957  . Abdominal hysterectomy  12/1969  . Cholecystectomy  1997  . Cataract extraction    . Spit seed removal    . Laparoscopic sigmoid colectomy  2013    secondary to diverticulitis    Family History        Family Status  Relation Status Death Age  . Mother Deceased   . Father Deceased   . Brother Deceased   . Son Alive   . Maternal Grandmother Deceased   . Maternal Grandfather Deceased   . Paternal Grandmother Deceased   . Paternal Grandfather Deceased   . Son Deceased         Her family history includes Alcohol abuse in her mother; Cancer in her son; Gout in her father; Heart disease in her father; Hypertension in her father; OCD in her son; Pneumonia in her paternal grandfather; Prostatitis in  her paternal grandfather; Ulcers in her father.    Allergies  Allergen Reactions  . Codeine Other (See Comments)    GI upset  . Cyclosporine Other (See Comments)    Burning eyes  . Other     Other reaction(s): OTHER  . Pregabalin Other (See Comments)    Personality changes-becomes angry  . Zolpidem     Other reaction(s): ITCHING    Previous Medications   ACETAMINOPHEN (TYLENOL) 500 MG TABLET    Take 1,000 mg by mouth every 6 (six) hours as needed.   ALBUTEROL  (PROVENTIL) (2.5 MG/3ML) 0.083% NEBULIZER SOLUTION       ALPRAZOLAM (XANAX) 0.5 MG TABLET       ASPIRIN 81 MG TABLET    Take 81 mg by mouth daily.   ATORVASTATIN (LIPITOR) 20 MG TABLET       CALCIUM CARBONATE-VITAMIN D (CALCIUM 600+D) 600-200 MG-UNIT TABS    Take by mouth.   DIPHENHYDRAMINE HCL (BENADRYL ALLERGY PO)    Take by mouth as needed.   ENBREL SURECLICK 50 MG/ML INJECTION       FOLIC ACID (FOLVITE) 673 MCG TABLET    Take 400 mcg by mouth daily.   GABAPENTIN (NEURONTIN) 100 MG CAPSULE       HYDROCHLOROTHIAZIDE (HYDRODIURIL) 50 MG TABLET       METHOTREXATE (RHEUMATREX) 2.5 MG TABLET       MULTIPLE VITAMINS-MINERALS (MULTIVITAMIN ADULT PO)    Take by mouth.   OMEGA 3 1000 MG CAPS    Take 3 capsules by mouth daily.   POTASSIUM 99 MG TABS    Take by mouth daily.   PROAIR HFA 108 (90 BASE) MCG/ACT INHALER       RANITIDINE (ZANTAC) 150 MG TABLET    Take 150 mg by mouth 2 (two) times daily.   VITAMIN D, CHOLECALCIFEROL, 1000 UNITS TABS    Take by mouth.    Patient Care Team: [name], PA as PCP - General (Family Medicine)     Objective:   Vitals: BP 174/102 mmHg  Pulse 84  Temp(Src) 97.9 F (36.6 C) (Oral)  Resp 16  Ht 5' 3.5" (1.613 m)  Wt 156 lb (70.761 kg)  BMI 27.20 kg/m2   Physical Exam  Constitutional: She is oriented to person, place, and time. She appears well-developed and well-nourished.  HENT:  Head: Normocephalic.  Right Ear: External ear normal.  Left Ear: External ear normal.  Nose: Nose normal.  Eyes: Conjunctivae and EOM are normal.  History of bilateral cataract surgery.  Neck: Neck supple.  Cardiovascular: Normal rate, regular rhythm and normal heart sounds.   Pulmonary/Chest: Effort normal and breath sounds normal.  Abdominal: Soft. Bowel sounds are normal.  Musculoskeletal: She exhibits tenderness.  Stiffness and aching in joints in hands, shoulders and feet. Crepitus in left knee to test ROM.  Lymphadenopathy:    She has no cervical  adenopathy.  Neurological: She is alert and oriented to person, place, and time. She has normal reflexes.  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    Depression Screen PHQ 2/9 Scores 10/05/2015  PHQ - 2 Score 4  PHQ- 9 Score 12    Assessment & Plan:   1. Encounter to establish care Previous patient of Dr. Hardin Negus who has retired. Other providers include vascular (Dr. Lucky Cowboy - last visit 04-14-15), eye (Dr. Eugene Gavia - last visit 07-28-14), ENT (Dr. Tami Ribas - last visit 03-04-15) and rheumatologist (Dr. Viona Gilmore.  [facility] - last visit 07-27-15). Given anticipatory quidance.  2. Osteoarthritis, unspecified osteoarthritis type, unspecified site Followed by rheumatologist (Dr. Lindon Romp).  3. Hyperlipidemia Tolerating Atorvastatin and trying to follow low fat diet. Will recheck labs and follow up pending reports. - COMPLETE METABOLIC PANEL WITH GFR; Future - Lipid panel; Future - TSH; Future  4. Essential hypertension Tolerating HCTZ 50 mg qd without muscle cramps. BP high today (states she is anxious in a new practice and has had a large component of "White Coat Syndrome" affecting BP). Limit sodium and caffeinated beverages. Recheck routine labs and follow pending reports. - CBC with Differential/Platelet; Future  5. Gastroesophageal reflux disease, esophagitis presence not specified Denies hematemesis, melena or significant GI upset with use of Ranitidine 150 mg BID.   6. Osteoporosis Followed by Dr. Lindon Romp (rheumatologist) and off bisphosphonates. Continues calcium and vitamin-D supplements.  7. 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 Diagnosed by Dr. Lindon Romp in 2002 with last follow up visit 07-27-15 (usually follow up q 3 months). Presently treated with Methotrexate 2.5 mg six tablets Mondays, Gabapentin 100 mg TID and  Embril one injection each Wednesdays. - Sedimentation rate; Future   Immunization History  Administered Date(s) Administered  . Influenza-Unspecified 07/17/2015  . Pneumococcal Polysaccharide-23 07/17/2015     Discussed health benefits of physical activity, and encouraged her to engage in regular exercise appropriate for her age and condition.    --------------------------------------------------------------------

## 2015-10-11 ENCOUNTER — Encounter: Payer: Self-pay | Admitting: Family Medicine

## 2015-10-16 DIAGNOSIS — I6523 Occlusion and stenosis of bilateral carotid arteries: Secondary | ICD-10-CM | POA: Diagnosis not present

## 2015-10-20 ENCOUNTER — Other Ambulatory Visit: Payer: Self-pay

## 2015-10-20 DIAGNOSIS — M059 Rheumatoid arthritis with rheumatoid factor, unspecified: Secondary | ICD-10-CM

## 2015-10-20 DIAGNOSIS — I10 Essential (primary) hypertension: Secondary | ICD-10-CM | POA: Diagnosis not present

## 2015-10-20 DIAGNOSIS — E785 Hyperlipidemia, unspecified: Secondary | ICD-10-CM

## 2015-10-21 LAB — COMPREHENSIVE METABOLIC PANEL
A/G RATIO: 1.9 (ref 1.1–2.5)
ALK PHOS: 54 IU/L (ref 39–117)
ALT: 23 IU/L (ref 0–32)
AST: 22 IU/L (ref 0–40)
Albumin: 4.9 g/dL — ABNORMAL HIGH (ref 3.5–4.8)
BILIRUBIN TOTAL: 0.3 mg/dL (ref 0.0–1.2)
BUN/Creatinine Ratio: 17 (ref 11–26)
BUN: 13 mg/dL (ref 8–27)
CHLORIDE: 95 mmol/L — AB (ref 96–106)
CO2: 20 mmol/L (ref 18–29)
Calcium: 10.3 mg/dL (ref 8.7–10.3)
Creatinine, Ser: 0.75 mg/dL (ref 0.57–1.00)
GFR calc Af Amer: 90 mL/min/{1.73_m2} (ref >59)
GFR, EST NON AFRICAN AMERICAN: 78 mL/min/{1.73_m2} (ref >59)
GLOBULIN, TOTAL: 2.6 g/dL (ref 1.5–4.5)
Glucose: 97 mg/dL (ref 65–99)
POTASSIUM: 4.1 mmol/L (ref 3.5–5.2)
SODIUM: 139 mmol/L (ref 134–144)
Total Protein: 7.5 g/dL (ref 6.0–8.5)

## 2015-10-21 LAB — CBC WITH DIFFERENTIAL/PLATELET
BASOS: 1 %
Basophils Absolute: 0.1 10*3/uL (ref 0.0–0.2)
EOS (ABSOLUTE): 0.2 10*3/uL (ref 0.0–0.4)
EOS: 2 %
HEMATOCRIT: 45.3 % (ref 34.0–46.6)
Hemoglobin: 16 g/dL — ABNORMAL HIGH (ref 11.1–15.9)
Immature Grans (Abs): 0 10*3/uL (ref 0.0–0.1)
Immature Granulocytes: 0 %
LYMPHS ABS: 2 10*3/uL (ref 0.7–3.1)
Lymphs: 25 %
MCH: 34.4 pg — AB (ref 26.6–33.0)
MCHC: 35.3 g/dL (ref 31.5–35.7)
MCV: 97 fL (ref 79–97)
MONOS ABS: 1 10*3/uL — AB (ref 0.1–0.9)
Monocytes: 13 %
NEUTROS ABS: 4.7 10*3/uL (ref 1.4–7.0)
Neutrophils: 59 %
Platelets: 213 10*3/uL (ref 150–379)
RBC: 4.65 x10E6/uL (ref 3.77–5.28)
RDW: 14.4 % (ref 12.3–15.4)
WBC: 7.9 10*3/uL (ref 3.4–10.8)

## 2015-10-21 LAB — SEDIMENTATION RATE: Sed Rate: 25 mm/hr (ref 0–40)

## 2015-10-21 LAB — LIPID PANEL
CHOL/HDL RATIO: 3.9 ratio (ref 0.0–4.4)
CHOLESTEROL TOTAL: 174 mg/dL (ref 100–199)
HDL: 45 mg/dL (ref >39)
LDL Calculated: 80 mg/dL (ref 0–99)
TRIGLYCERIDES: 244 mg/dL — AB (ref 0–149)
VLDL Cholesterol Cal: 49 mg/dL — ABNORMAL HIGH (ref 5–40)

## 2015-10-21 LAB — TSH: TSH: 1.11 u[IU]/mL (ref 0.450–4.500)

## 2015-10-22 ENCOUNTER — Telehealth: Payer: Self-pay

## 2015-10-22 NOTE — Telephone Encounter (Signed)
Patient advised as directed below. Patient verbalized understanding and agrees with plan of care. Patient states she will call to schedule a follow up appointment.

## 2015-10-22 NOTE — Telephone Encounter (Signed)
-----   Message from [name], [place] sent at 10/22/2015  1:18 PM EST ----- Blood tests in good shape except triglycerides high. Need to continue Atrovastatin (Lipitor) 20 mg qd and follow low fat diet with walking for exercise. Recheck levels in 3-4 months.

## 2015-11-10 ENCOUNTER — Other Ambulatory Visit: Payer: Self-pay

## 2015-11-10 NOTE — Telephone Encounter (Signed)
Need patient to come in for recheck and bring these medications with her (she did not have frequency of dosing in the chart).

## 2015-11-10 NOTE — Telephone Encounter (Signed)
Patient advised as directed below. Patient states she will call back to schedule a appointment once she arranges transportation.

## 2015-11-10 NOTE — Telephone Encounter (Signed)
Refill request received from [facility] requesting ProAir 90 mcg and Alprazolam 0.'5mg'$ 

## 2015-11-11 ENCOUNTER — Telehealth: Payer: Self-pay | Admitting: Family Medicine

## 2015-11-13 ENCOUNTER — Encounter: Payer: Self-pay | Admitting: Family Medicine

## 2015-11-13 ENCOUNTER — Ambulatory Visit (INDEPENDENT_AMBULATORY_CARE_PROVIDER_SITE_OTHER): Payer: Medicare Other | Admitting: Family Medicine

## 2015-11-13 VITALS — BP 150/88 | HR 86 | Temp 97.9°F | Resp 14 | Wt 155.0 lb

## 2015-11-13 DIAGNOSIS — J449 Chronic obstructive pulmonary disease, unspecified: Secondary | ICD-10-CM | POA: Diagnosis not present

## 2015-11-13 DIAGNOSIS — F409 Phobic anxiety disorder, unspecified: Secondary | ICD-10-CM | POA: Insufficient documentation

## 2015-11-13 DIAGNOSIS — J42 Unspecified chronic bronchitis: Secondary | ICD-10-CM | POA: Insufficient documentation

## 2015-11-13 MED ORDER — ALPRAZOLAM 0.5 MG PO TABS: 0.5000 mg | ORAL_TABLET | Freq: Two times a day (BID) | ORAL | Status: DC | PRN

## 2015-11-13 MED ORDER — PROAIR HFA 108 (90 BASE) MCG/ACT IN AERS: 2.0000 | INHALATION_SPRAY | Freq: Four times a day (QID) | RESPIRATORY_TRACT | Status: DC | PRN

## 2015-11-13 NOTE — Progress Notes (Signed)
Patient ID: Donna Mcneil, female   DOB: 1941-03-21, 75 y.o.   MRN: [id number]   Patient: Donna Mcneil Female    DOB: Apr 18, 1941   75 y.o.   MRN: [id number] Visit Date: 11/13/2015  Today's Provider: Vernie Murders, PA   Chief Complaint  Patient presents with  . Medication Refill   Subjective:    HPI Patient is requesting medication refills be sent to [facility]. Patient is requesting Albuterol inhaler and Alprazolam 0.5 mg. Has had significant anxiety/panic since sone died 03-03-2015. "He was the primary caregiver at home (because of RA)."  Uses ProAir-HFA  2-3 times a day for wheezing with rare nebulizer with Albuterol is uncontrolled. Xanax help control anxiety and allow her to sleep better at night. No suicidal ideation.   Previous Medications   ACETAMINOPHEN (TYLENOL) 500 MG TABLET    Take 1,000 mg by mouth every 6 (six) hours as needed.   ALBUTEROL (PROVENTIL) (2.5 MG/3ML) 0.083% NEBULIZER SOLUTION    2.5 mg every 4 (four) hours as needed.    ALPRAZOLAM (XANAX) 0.5 MG TABLET    0.5 mg 2 (two) times daily as needed.    ASPIRIN 81 MG TABLET    Take 81 mg by mouth daily.   ATORVASTATIN (LIPITOR) 20 MG TABLET    Take 20 mg by mouth daily.    CALCIUM CARBONATE-VITAMIN D (CALCIUM 600+D) 600-200 MG-UNIT TABS    Take 1 tablet by mouth 2 (two) times daily.    DIPHENHYDRAMINE HCL (BENADRYL ALLERGY PO)    Take by mouth as needed.   ENBREL SURECLICK 50 MG/ML INJECTION    50 mg once a week.    FOLIC ACID (FOLVITE) 628 MCG TABLET    Take 400 mcg by mouth daily.   GABAPENTIN (NEURONTIN) 100 MG CAPSULE    Take 100 mg by mouth 3 (three) times daily.    HYDROCHLOROTHIAZIDE (HYDRODIURIL) 50 MG TABLET    Take 50 mg by mouth daily.    METHOTREXATE (RHEUMATREX) 2.5 MG TABLET    Take 15 mg by mouth once a week.    MULTIPLE VITAMINS-MINERALS (MULTIVITAMIN ADULT PO)    Take by mouth daily.    OMEGA 3 1000 MG CAPS    Take 3 capsules by mouth daily.   POTASSIUM 99 MG TABS    Take by  mouth daily.   PROAIR HFA 108 (90 BASE) MCG/ACT INHALER    Inhale 2 puffs into the lungs every 6 (six) hours as needed.    RANITIDINE (ZANTAC) 150 MG TABLET    Take 150 mg by mouth 2 (two) times daily.   VITAMIN D, CHOLECALCIFEROL, 1000 UNITS TABS    Take by mouth daily.    Allergies  Allergen Reactions  . Codeine Other (See Comments)    GI upset  . Cyclosporine Other (See Comments)    Burning eyes  . Other     Other reaction(s): OTHER  . Pregabalin Other (See Comments)    Personality changes-becomes angry  . Zolpidem     Other reaction(s): ITCHING    Review of Systems  Constitutional: Negative.   Eyes: Negative.   Respiratory: Negative.   Cardiovascular: Negative.   Gastrointestinal: Negative.   Endocrine: Negative.   Genitourinary: Negative.   Musculoskeletal: Positive for arthralgias.       Rheumatoid arthritis.  Skin: Negative.   Allergic/Immunologic: Negative.   Neurological: Negative.   Hematological: Negative.   Psychiatric/Behavioral: The patient is nervous/anxious.     Social History  Substance  Use Topics  . Smoking status: Current Every Day Smoker -- 0.50 packs/day for 62 years    Types: Cigarettes  . Smokeless tobacco: Never Used  . Alcohol Use: No   Objective:   BP 150/88 mmHg  Pulse 86  Temp(Src) 97.9 F (36.6 C) (Oral)  Resp 14  Wt 155 lb (70.308 kg)  Physical Exam  Constitutional: She is oriented to person, place, and time. She appears well-developed and well-nourished. No distress.  HENT:  Head: Normocephalic and atraumatic.  Right Ear: Hearing normal.  Left Ear: Hearing normal.  Nose: Nose normal.  Eyes: Conjunctivae and lids are normal. Right eye exhibits no discharge. Left eye exhibits no discharge. No scleral icterus.  Neck: Normal range of motion. Neck supple.  Pulmonary/Chest: Effort normal. No respiratory distress.  Distant breath sounds without wheeze at the present.  Musculoskeletal: She exhibits tenderness.  Joints of hands and  feet. Some occasional swelling of ankles. Weak grip.  Neurological: She is alert and oriented to person, place, and time.  Skin: Skin is intact. No lesion and no rash noted.  Psychiatric: She has a normal mood and affect. Her speech is normal and behavior is normal. Thought content normal.      Assessment & Plan:     1. Phobic anxiety disorder Very anxious and sad with fear of "something about to happen" since her son died in 2014/11/24 (he was her primary caregiver). Alprazolam controls symptoms and allows her to sleep well at night without drowsiness. Will refill and plan follow up in 3 months. - ALPRAZolam (XANAX) 0.5 MG tablet; Take 1 tablet (0.5 mg total) by mouth 2 (two) times daily as needed.  Dispense: 60 tablet; Refill: 3  2. COPD (chronic obstructive pulmonary disease) with chronic bronchitis (HCC) Has some sputum production each morning but not purulent. Uses ProAir-HFA to help with wheeze and dyspnea. Occasionally has to use the Albuterol by the nebulizer at home. Recommend she stop all smoking (but doubt she will completely quit). Recheck in 3-4 months. - PROAIR HFA 108 (90 Base) MCG/ACT inhaler; Inhale 2 puffs into the lungs every 6 (six) hours as needed.  Dispense: 8.5 g; Refill: 6

## 2015-12-04 ENCOUNTER — Encounter: Payer: Self-pay | Admitting: Family Medicine

## 2016-01-22 ENCOUNTER — Ambulatory Visit: Payer: Medicare Other | Admitting: Family Medicine

## 2016-01-22 ENCOUNTER — Encounter: Payer: Self-pay | Admitting: Family Medicine

## 2016-01-22 ENCOUNTER — Ambulatory Visit (INDEPENDENT_AMBULATORY_CARE_PROVIDER_SITE_OTHER): Payer: Medicare Other | Admitting: Family Medicine

## 2016-01-22 VITALS — BP 138/82 | HR 96 | Temp 98.3°F | Resp 20 | Ht 64.0 in

## 2016-01-22 DIAGNOSIS — F409 Phobic anxiety disorder, unspecified: Secondary | ICD-10-CM

## 2016-01-22 DIAGNOSIS — M059 Rheumatoid arthritis with rheumatoid factor, unspecified: Secondary | ICD-10-CM

## 2016-01-22 DIAGNOSIS — J449 Chronic obstructive pulmonary disease, unspecified: Secondary | ICD-10-CM | POA: Diagnosis not present

## 2016-01-22 DIAGNOSIS — I10 Essential (primary) hypertension: Secondary | ICD-10-CM | POA: Diagnosis not present

## 2016-01-22 DIAGNOSIS — E785 Hyperlipidemia, unspecified: Secondary | ICD-10-CM

## 2016-01-22 DIAGNOSIS — R42 Dizziness and giddiness: Secondary | ICD-10-CM

## 2016-01-22 DIAGNOSIS — J4489 Other specified chronic obstructive pulmonary disease: Secondary | ICD-10-CM

## 2016-01-22 NOTE — Progress Notes (Signed)
Patient ID: Donna Mcneil, female   DOB: 11/12/1940, 75 y.o.   MRN: [id number]       Patient: Donna Mcneil Female    DOB: [birth year]   75 y.o.   MRN: [id number] Visit Date: 01/22/2016  Today's Provider: Vernie Murders, PA   Chief Complaint  Patient presents with  . COPD    3 month F/U.   Marland Kitchen Anxiety  . Dizziness    X 1 week.    Subjective:    HPI Patient comes in today for a follow up. She reports that Xanax is helping with her anxiety and her sleep at night. Some crying spells and feeling more depressed the past 4-6 weeks. Son's birth date was "hard" (he died at age 90 five to six years ago). She also reports that her COPD is controlled with inhalers. She does mention that she had to use her rescue inhaler once this morning. Has a history of allergies to oak and ragweed pollen.  Patient also reports that she has had dizziness off and on X 1 week. She reports that she feels like the room is spinning. She is currently having symptoms today. Patient denies any nausea. Does have a slight headache. She also feels slightly off balance. She has not been taking anything OTC for symptoms.    No past medical history on file. Patient Active Problem List   Diagnosis Date Noted  . 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 COPD (chronic obstructive pulmonary disease) with chronic bronchitis ([place]) 11/13/2015  . Osteoarthritis 10/05/2015  . Hyperlipidemia 10/05/2015  . Hypertension 10/05/2015  . Osteoporosis 10/05/2015  . Acid reflux 10/05/2015  . Peptic ulcer 10/05/2015  . Cataract 10/05/2015  . Tendinitis 10/05/2015  . Osteopenia 02/11/2014  . Seropositive rheumatoid arthritis ([place]) 02/11/2014   Past Surgical History  Procedure Laterality Date  . Tonsillectomy  1945  . Appendectomy  1957  . Abdominal hysterectomy  12/1969  . Cholecystectomy  1997  . Cataract extraction    . Spit seed removal    . Laparoscopic sigmoid colectomy  2013    secondary to diverticulitis   Family  History  Problem Relation Age of Onset  . Alcohol abuse Mother   . Heart disease Father   . Hypertension Father   . Gout Father   . Ulcers Father   . OCD Son   . Prostatitis Paternal Grandfather   . Pneumonia Paternal Grandfather   . Cancer Son    Allergies  Allergen Reactions  . Codeine Other (See Comments)    GI upset  . Cyclosporine Other (See Comments)    Burning eyes  . Other     Other reaction(s): OTHER  . Pregabalin Other (See Comments)    Personality changes-becomes angry  . Zolpidem     Other reaction(s): ITCHING   Previous Medications   ACETAMINOPHEN (TYLENOL) 500 MG TABLET    Take 1,000 mg by mouth every 6 (six) hours as needed.   ALBUTEROL (PROVENTIL) (2.5 MG/3ML) 0.083% NEBULIZER SOLUTION    2.5 mg every 4 (four) hours as needed.    ALPRAZOLAM (XANAX) 0.5 MG TABLET    Take 1 tablet (0.5 mg total) by mouth 2 (two) times daily as needed.   ASPIRIN 81 MG TABLET    Take 81 mg by mouth daily.   ATORVASTATIN (LIPITOR) 20 MG TABLET    Take 20 mg by mouth daily.    CALCIUM CARBONATE-VITAMIN D (CALCIUM 600+D) 600-200 MG-UNIT TABS    Take 1 tablet by  mouth 2 (two) times daily.    DIPHENHYDRAMINE HCL (BENADRYL ALLERGY PO)    Take by mouth as needed.   ENBREL SURECLICK 50 MG/ML INJECTION    50 mg once a week.    FOLIC ACID (FOLVITE) 268 MCG TABLET    Take 400 mcg by mouth daily.   GABAPENTIN (NEURONTIN) 100 MG CAPSULE    Take 100 mg by mouth 3 (three) times daily.    HYDROCHLOROTHIAZIDE (HYDRODIURIL) 50 MG TABLET    Take 50 mg by mouth daily.    METHOTREXATE (RHEUMATREX) 2.5 MG TABLET    Take 15 mg by mouth once a week.    MULTIPLE VITAMINS-MINERALS (MULTIVITAMIN ADULT PO)    Take by mouth daily.    OMEGA 3 1000 MG CAPS    Take 3 capsules by mouth daily.   POTASSIUM 99 MG TABS    Take by mouth daily.   PROAIR HFA 108 (90 BASE) MCG/ACT INHALER    Inhale 2 puffs into the lungs every 6 (six) hours as needed.   RANITIDINE (ZANTAC) 150 MG TABLET    Take 150 mg by mouth 2 (two)  times daily.   VITAMIN D, CHOLECALCIFEROL, 1000 UNITS TABS    Take by mouth daily.     Review of Systems  Constitutional: Positive for activity change. Negative for fever, chills, diaphoresis, appetite change, fatigue and unexpected weight change.  Respiratory: Positive for cough, shortness of breath and wheezing.   Cardiovascular: Negative.   Neurological: Positive for dizziness.  Psychiatric/Behavioral: Negative.     Social History  Substance Use Topics  . Smoking status: Current Every Day Smoker -- 0.50 packs/day for 62 years    Types: Cigarettes  . Smokeless tobacco: Never Used  . Alcohol Use: No   Objective:   BP 138/82 mmHg  Pulse 96  Temp(Src) 98.3 F (36.8 C)  Resp 20  Ht '5\' 4"'$  (1.626 m)  Wt   SpO2 96% BP Readings from Last 3 Encounters:  01/22/16 138/82  11/13/15 150/88  10/05/15 174/102   Physical Exam  Constitutional: She is oriented to person, place, and time. She appears well-developed and well-nourished. No distress.  HENT:  Head: Normocephalic and atraumatic.  Right Ear: Hearing and external ear normal.  Left Ear: Hearing and external ear normal.  Nose: Nose normal.  Mouth/Throat: Oropharynx is clear and moist.  Eyes: Conjunctivae and lids are normal. Right eye exhibits no discharge. Left eye exhibits no discharge. No scleral icterus.  Neck: Neck supple.  Cardiovascular: Normal rate, regular rhythm and normal heart sounds.   Pulmonary/Chest: Effort normal. No respiratory distress.  Distant breath sounds. No rales or rhonchi today.  Abdominal: Soft. Bowel sounds are normal.  Musculoskeletal: She exhibits tenderness.  Hands, knees and feet due to RA. Followed by Dr. Jefm Mcneil.  Lymphadenopathy:    She has no cervical adenopathy.  Neurological: She is alert and oriented to person, place, and time.  Skin: Skin is intact. No lesion and no rash noted.  Psychiatric: She has a normal mood and affect. Her speech is normal and behavior is normal. Thought  content normal.      Assessment & Plan:     1. Dizziness, nonspecific Intermittent sensation over the past week of the "room spinning". No ataxia at the present. Slight difficulty with walking heel-to-toe. May use Meclizine prn (OTC). Will check labs and may try Epley maneuver to abate suspected BPPV. Will check labs. If no better in 5-7 days, may need referral to ENT or neurologist. -  CBC with Differential/Platelet - Comprehensive metabolic panel  2. Hyperlipidemia Tolerating Lipitor 20 mg qd without side effects. Continue low fat diet and recheck labs. - Lipid panel  3. COPD (chronic obstructive pulmonary disease) with chronic bronchitis (HCC) No acute symptoms today. Pulse oximetry 96% today. Uses  Proair-HFA prn and occasionally Albuterol by nebulizer at home. Continues to smoke approximately 1/2 ppd. Recommend she stop all smoking and recheck as needed.  4. Essential hypertension Good control with use of HCTZ 50 mg qd. Will check labs for sign of hypokalemia.  - CBC with Differential/Platelet - Comprehensive metabolic panel  5. Seropositive rheumatoid arthritis ([place]) Presently on Methotrexate 15 mg once a week and Enbrel injection once a week. Followed by Dr. Jefm Mcneil (rheumatologist).   6. Phobic anxiety disorder Stable with occasional flares she associates with the anniversary of the death of her son or his birthday. Xanax helping with sleep disturbance and controlling significant anxiety reactions. Continue prn usage and follow up in 3 months.       Vernie Murders, PA  [facility]

## 2016-01-23 LAB — CBC WITH DIFFERENTIAL/PLATELET
BASOS ABS: 0.1 10*3/uL (ref 0.0–0.2)
Basos: 1 %
EOS (ABSOLUTE): 0.1 10*3/uL (ref 0.0–0.4)
EOS: 2 %
HEMATOCRIT: 46 % (ref 34.0–46.6)
HEMOGLOBIN: 16 g/dL — AB (ref 11.1–15.9)
IMMATURE GRANS (ABS): 0 10*3/uL (ref 0.0–0.1)
IMMATURE GRANULOCYTES: 0 %
LYMPHS: 28 %
Lymphocytes Absolute: 2.1 10*3/uL (ref 0.7–3.1)
MCH: 33.7 pg — AB (ref 26.6–33.0)
MCHC: 34.8 g/dL (ref 31.5–35.7)
MCV: 97 fL (ref 79–97)
MONOCYTES: 10 %
Monocytes Absolute: 0.7 10*3/uL (ref 0.1–0.9)
Neutrophils Absolute: 4.5 10*3/uL (ref 1.4–7.0)
Neutrophils: 59 %
Platelets: 244 10*3/uL (ref 150–379)
RBC: 4.75 x10E6/uL (ref 3.77–5.28)
RDW: 14.5 % (ref 12.3–15.4)
WBC: 7.5 10*3/uL (ref 3.4–10.8)

## 2016-01-23 LAB — COMPREHENSIVE METABOLIC PANEL
A/G RATIO: 1.5 (ref 1.2–2.2)
ALK PHOS: 53 IU/L (ref 39–117)
ALT: 24 IU/L (ref 0–32)
AST: 23 IU/L (ref 0–40)
Albumin: 4.8 g/dL (ref 3.5–4.8)
BUN / CREAT RATIO: 17 (ref 12–28)
BUN: 12 mg/dL (ref 8–27)
Bilirubin Total: 0.6 mg/dL (ref 0.0–1.2)
CO2: 25 mmol/L (ref 18–29)
Calcium: 10.2 mg/dL (ref 8.7–10.3)
Chloride: 93 mmol/L — ABNORMAL LOW (ref 96–106)
Creatinine, Ser: 0.69 mg/dL (ref 0.57–1.00)
GFR calc Af Amer: 98 mL/min/{1.73_m2} (ref >59)
GFR calc non Af Amer: 85 mL/min/{1.73_m2} (ref >59)
GLOBULIN, TOTAL: 3.1 g/dL (ref 1.5–4.5)
Glucose: 97 mg/dL (ref 65–99)
POTASSIUM: 4.2 mmol/L (ref 3.5–5.2)
SODIUM: 138 mmol/L (ref 134–144)
Total Protein: 7.9 g/dL (ref 6.0–8.5)

## 2016-01-23 LAB — LIPID PANEL
CHOL/HDL RATIO: 3.2 ratio (ref 0.0–4.4)
CHOLESTEROL TOTAL: 155 mg/dL (ref 100–199)
HDL: 49 mg/dL (ref >39)
LDL Calculated: 63 mg/dL (ref 0–99)
TRIGLYCERIDES: 213 mg/dL — AB (ref 0–149)
VLDL Cholesterol Cal: 43 mg/dL — ABNORMAL HIGH (ref 5–40)

## 2016-01-26 DIAGNOSIS — H8110 Benign paroxysmal vertigo, unspecified ear: Secondary | ICD-10-CM | POA: Diagnosis not present

## 2016-01-26 DIAGNOSIS — M0579 Rheumatoid arthritis with rheumatoid factor of multiple sites without organ or systems involvement: Secondary | ICD-10-CM | POA: Diagnosis not present

## 2016-01-26 DIAGNOSIS — J42 Unspecified chronic bronchitis: Secondary | ICD-10-CM | POA: Diagnosis not present

## 2016-02-22 ENCOUNTER — Other Ambulatory Visit: Payer: Self-pay | Admitting: Family Medicine

## 2016-02-22 MED ORDER — ATORVASTATIN CALCIUM 20 MG PO TABS: 20.0000 mg | ORAL_TABLET | Freq: Every day | ORAL | Status: DC

## 2016-02-25 ENCOUNTER — Other Ambulatory Visit: Payer: Self-pay | Admitting: Family Medicine

## 2016-02-25 MED ORDER — HYDROCHLOROTHIAZIDE 50 MG PO TABS: 50.0000 mg | ORAL_TABLET | Freq: Every day | ORAL | Status: DC

## 2016-04-12 DIAGNOSIS — E785 Hyperlipidemia, unspecified: Secondary | ICD-10-CM | POA: Diagnosis not present

## 2016-04-12 DIAGNOSIS — I6523 Occlusion and stenosis of bilateral carotid arteries: Secondary | ICD-10-CM | POA: Diagnosis not present

## 2016-04-22 ENCOUNTER — Ambulatory Visit: Payer: Self-pay | Admitting: Family Medicine

## 2016-04-26 ENCOUNTER — Encounter: Payer: Self-pay | Admitting: Family Medicine

## 2016-04-26 ENCOUNTER — Ambulatory Visit (INDEPENDENT_AMBULATORY_CARE_PROVIDER_SITE_OTHER): Payer: Medicare Other | Admitting: Family Medicine

## 2016-04-26 VITALS — BP 152/76 | HR 79 | Temp 98.4°F | Wt 159.8 lb

## 2016-04-26 DIAGNOSIS — J449 Chronic obstructive pulmonary disease, unspecified: Secondary | ICD-10-CM | POA: Diagnosis not present

## 2016-04-26 DIAGNOSIS — D7589 Other specified diseases of blood and blood-forming organs: Secondary | ICD-10-CM | POA: Diagnosis not present

## 2016-04-26 DIAGNOSIS — G629 Polyneuropathy, unspecified: Secondary | ICD-10-CM | POA: Diagnosis not present

## 2016-04-26 DIAGNOSIS — E785 Hyperlipidemia, unspecified: Secondary | ICD-10-CM

## 2016-04-26 DIAGNOSIS — M059 Rheumatoid arthritis with rheumatoid factor, unspecified: Secondary | ICD-10-CM

## 2016-04-26 DIAGNOSIS — I10 Essential (primary) hypertension: Secondary | ICD-10-CM

## 2016-04-26 NOTE — Progress Notes (Signed)
Patient: Donna Mcneil Female    DOB: 07-Feb-1941   74 y.o.   MRN: [id number] Visit Date: 04/26/2016  Today's Provider: Vernie Murders, PA   Chief Complaint  Patient presents with  . Hyperlipidemia  . Hypertension  . COPD  . Follow-up   Subjective:    HPI   Hypertension, follow-up:  BP Readings from Last 3 Encounters:  01/22/16 138/82  11/13/15 (!) 150/88  10/05/15 (!) 174/102    She was last seen for hypertension 3 months ago.  BP at that visit was 138/82. Management since that visit includes continue medications.She reports good compliance with treatment. She is not having side effects.  She is exercising lightly. She is adherent to low salt diet.   Outside blood pressures are being checked. She is experiencing none.  Patient denies none.   Cardiovascular risk factors include advanced age (older than 73 for men, 17 for women), dyslipidemia, hypertension and sedentary lifestyle.  Use of agents associated with hypertension: none.   ------------------------------------------------------------------------    Lipid/Cholesterol, Follow-up:   Last seen for this 3 months ago.  Management since that visit includes continue medications.  Last Lipid Panel:    Component Value Date/Time   CHOL 155 01/22/2016 1045   TRIG 213 (H) 01/22/2016 1045   HDL 49 01/22/2016 1045   CHOLHDL 3.2 01/22/2016 1045   LDLCALC 63 01/22/2016 1045    She reports good compliance with treatment. She is not having side effects.   Wt Readings from Last 3 Encounters:  11/13/15 155 lb (70.3 kg)  10/05/15 156 lb (70.8 kg)    ------------------------------------------------------------------------ COPD:   3 month follow up. Stable. Some worsening with rainy weather. Causes more shortness of breath. Use of Albuterol by nebulizer QID at home helps a great deal but feel a little shaky afterward.  Past Medical History:  Diagnosis Date  . Diverticulitis   . Hypercholesteremia   .  Hypertension   . Osteoporosis   . Rheumatoid arteritis   . Tobacco use    Past Surgical History:  Procedure Laterality Date  . ABDOMINAL HYSTERECTOMY  12/1969  . APPENDECTOMY  1957  . CATARACT EXTRACTION    . CHOLECYSTECTOMY  1997  . LAPAROSCOPIC SIGMOID COLECTOMY  2013   secondary to diverticulitis  . spit seed removal    . TONSILLECTOMY  1945   Family History  Problem Relation Age of Onset  . Alcohol abuse Mother   . Heart disease Father   . Hypertension Father   . Gout Father   . Ulcers Father   . OCD Son   . Prostatitis Paternal Grandfather   . Pneumonia Paternal Grandfather   . Cancer Son    Allergies  Allergen Reactions  . Codeine Other (See Comments)    GI upset  . Cyclosporine Other (See Comments)    Burning eyes  . Other     Other reaction(s): OTHER  . Pregabalin Other (See Comments)    Personality changes-becomes angry  . Zolpidem     Other reaction(s): ITCHING   Previous Medications   ACETAMINOPHEN (TYLENOL) 500 MG TABLET    Take 1,000 mg by mouth every 6 (six) hours as needed.   ALBUTEROL (PROVENTIL) (2.5 MG/3ML) 0.083% NEBULIZER SOLUTION    2.5 mg every 4 (four) hours as needed.    ALPRAZOLAM (XANAX) 0.5 MG TABLET    Take 1 tablet (0.5 mg total) by mouth 2 (two) times daily as needed.   ASPIRIN 81 MG TABLET    Take  81 mg by mouth daily.   ATORVASTATIN (LIPITOR) 20 MG TABLET    Take 1 tablet (20 mg total) by mouth daily.   CALCIUM CARBONATE-VITAMIN D (CALCIUM 600+D) 600-200 MG-UNIT TABS    Take 1 tablet by mouth 2 (two) times daily.    DIPHENHYDRAMINE HCL (BENADRYL ALLERGY PO)    Take by mouth as needed.   ENBREL SURECLICK 50 MG/ML INJECTION    50 mg once a week.    FOLIC ACID (FOLVITE) 347 MCG TABLET    Take 400 mcg by mouth daily.   GABAPENTIN (NEURONTIN) 100 MG CAPSULE    Take 100 mg by mouth 3 (three) times daily.    HYDROCHLOROTHIAZIDE (HYDRODIURIL) 50 MG TABLET    Take 1 tablet (50 mg total) by mouth daily.   METHOTREXATE (RHEUMATREX) 2.5 MG  TABLET    Take 15 mg by mouth once a week.    MULTIPLE VITAMINS-MINERALS (MULTIVITAMIN ADULT PO)    Take by mouth daily.    OMEGA 3 1000 MG CAPS    Take 3 capsules by mouth daily.   POTASSIUM 99 MG TABS    Take by mouth daily.   PROAIR HFA 108 (90 BASE) MCG/ACT INHALER    Inhale 2 puffs into the lungs every 6 (six) hours as needed.   RANITIDINE (ZANTAC) 150 MG TABLET    Take 150 mg by mouth 2 (two) times daily.   VITAMIN D, CHOLECALCIFEROL, 1000 UNITS TABS    Take by mouth daily.     Review of Systems  Constitutional: Negative.   Respiratory: Positive for cough.   Cardiovascular: Negative.   Genitourinary: Negative for frequency and urgency.       Occasional urge incontinence and uses pad at night for leakage. No need for pads during the day.    Social History  Substance Use Topics  . Smoking status: Current Every Day Smoker    Packs/day: 0.50    Years: 62.00    Types: Cigarettes  . Smokeless tobacco: Never Used  . Alcohol use No   Objective:   BP (!) 152/76 (BP Location: Right Arm, Patient Position: Sitting, Cuff Size: Normal)   Pulse 79   Temp 98.4 F (36.9 C) (Oral)   Wt 159 lb 12.8 oz (72.5 kg)   BMI 27.43 kg/m   Physical Exam  Constitutional: She is oriented to person, place, and time. She appears well-developed and well-nourished. No distress.  HENT:  Head: Normocephalic and atraumatic.  Right Ear: Hearing normal.  Left Ear: Hearing normal.  Nose: Nose normal.  Eyes: Conjunctivae and lids are normal. Right eye exhibits no discharge. Left eye exhibits no discharge. No scleral icterus.  Neck: Neck supple. No thyromegaly present.  Cardiovascular: Normal rate and regular rhythm.   Pulmonary/Chest: Effort normal. No respiratory distress.  Abdominal: Soft. Bowel sounds are normal.  Musculoskeletal:  Lateral deviation of toes. No ulnar deviation of fingers. Intermittent pains in hands and feet. Fair ROM today.  Neurological: She is alert and oriented to person, place,  and time.  Numbness balls of both feet.   Skin: Skin is intact. No lesion and no rash noted.  Psychiatric: She has a normal mood and affect. Her speech is normal and behavior is normal. Thought content normal.      Assessment & Plan:     1. Essential hypertension Slight increase in systolic pressure today. Still on HCTZ daily but admits to extra bologna and salted crackers. Recommend follow up labs, restrict sodium intake, stop smoking and  continue present medications. Recheck in 3 months. - CBC with Differential/Platelet - Comprehensive metabolic panel  2. COPD (chronic obstructive pulmonary disease) with chronic bronchitis ([place]) Still has dyspnea with humid weather. Albuterol by nebulizer at home QID continues to improve breathing. Will continue present regimen.  3. Hyperlipidemia Tolerating Atorvastatin without side effects. Trying to watch fats in diet but unable to adequately exercise due to RA and COPD. Recheck labs today. - Comprehensive metabolic panel - Lipid panel  4. Seropositive rheumatoid arthritis ([place]) Still on Enbrel and Methotrexate regularly. Follow up appointment with rheumatologist planned in November 2017. Gets pneumonia vaccination every 5 years and flu shot annually in November at her pharmacy. Recheck liver and kidney function with CBC.  - CBC with Differential/Platelet - Comprehensive metabolic panel

## 2016-04-27 LAB — COMPREHENSIVE METABOLIC PANEL
A/G RATIO: 1.5 (ref 1.2–2.2)
ALT: 25 IU/L (ref 0–32)
AST: 30 IU/L (ref 0–40)
Albumin: 4.5 g/dL (ref 3.5–4.8)
Alkaline Phosphatase: 54 IU/L (ref 39–117)
BUN/Creatinine Ratio: 16 (ref 12–28)
BUN: 12 mg/dL (ref 8–27)
Bilirubin Total: 0.4 mg/dL (ref 0.0–1.2)
CALCIUM: 9.2 mg/dL (ref 8.7–10.3)
CO2: 24 mmol/L (ref 18–29)
CREATININE: 0.74 mg/dL (ref 0.57–1.00)
Chloride: 98 mmol/L (ref 96–106)
GFR, EST AFRICAN AMERICAN: 92 mL/min/{1.73_m2} (ref >59)
GFR, EST NON AFRICAN AMERICAN: 80 mL/min/{1.73_m2} (ref >59)
GLUCOSE: 92 mg/dL (ref 65–99)
Globulin, Total: 3 g/dL (ref 1.5–4.5)
Potassium: 4.1 mmol/L (ref 3.5–5.2)
Sodium: 141 mmol/L (ref 134–144)
TOTAL PROTEIN: 7.5 g/dL (ref 6.0–8.5)

## 2016-04-27 LAB — CBC WITH DIFFERENTIAL/PLATELET
BASOS: 1 %
Basophils Absolute: 0.1 10*3/uL (ref 0.0–0.2)
EOS (ABSOLUTE): 0.2 10*3/uL (ref 0.0–0.4)
Eos: 2 %
Hematocrit: 44.9 % (ref 34.0–46.6)
Hemoglobin: 15.5 g/dL (ref 11.1–15.9)
IMMATURE GRANS (ABS): 0 10*3/uL (ref 0.0–0.1)
IMMATURE GRANULOCYTES: 0 %
LYMPHS: 22 %
Lymphocytes Absolute: 1.7 10*3/uL (ref 0.7–3.1)
MCH: 34.4 pg — ABNORMAL HIGH (ref 26.6–33.0)
MCHC: 34.5 g/dL (ref 31.5–35.7)
MCV: 100 fL — AB (ref 79–97)
Monocytes Absolute: 0.8 10*3/uL (ref 0.1–0.9)
Monocytes: 10 %
NEUTROS PCT: 65 %
Neutrophils Absolute: 5.1 10*3/uL (ref 1.4–7.0)
PLATELETS: 230 10*3/uL (ref 150–379)
RBC: 4.51 x10E6/uL (ref 3.77–5.28)
RDW: 14.8 % (ref 12.3–15.4)
WBC: 7.9 10*3/uL (ref 3.4–10.8)

## 2016-04-27 LAB — LIPID PANEL
CHOL/HDL RATIO: 3.3 ratio (ref 0.0–4.4)
Cholesterol, Total: 155 mg/dL (ref 100–199)
HDL: 47 mg/dL (ref >39)
LDL CALC: 63 mg/dL (ref 0–99)
TRIGLYCERIDES: 223 mg/dL — AB (ref 0–149)
VLDL CHOLESTEROL CAL: 45 mg/dL — AB (ref 5–40)

## 2016-04-28 ENCOUNTER — Telehealth: Payer: Self-pay

## 2016-04-28 NOTE — Telephone Encounter (Signed)
-----   Message from [name], [place] sent at 04/28/2016  8:17 AM EDT ----- Blood tests essentially unchanged triglycerides. Watch fats in diet. MCV and MCH elevated. Ask lab to the check B12 and folate level for macrocytosis.

## 2016-04-28 NOTE — Telephone Encounter (Signed)
Neche to add labs, waiting for confirmation.

## 2016-05-05 LAB — SPECIMEN STATUS REPORT

## 2016-05-05 LAB — B12 AND FOLATE PANEL: VITAMIN B 12: 1573 pg/mL — AB (ref 211–946)

## 2016-05-07 ENCOUNTER — Telehealth: Payer: Self-pay

## 2016-05-07 NOTE — Telephone Encounter (Signed)
Patient advised as directed below. Patient reports she is scheduled to come see [name].  Thanks,  -Adalie Mand

## 2016-05-07 NOTE — Telephone Encounter (Signed)
-----   Message from [name], [place] sent at 05/06/2016  5:51 PM EDT ----- B12 blood level above normal range. No B12 deficiency. Won't need extra vitamins. Continue present regimen and recheck progress in 3-4 months.

## 2016-06-24 ENCOUNTER — Encounter: Payer: Self-pay | Admitting: Family Medicine

## 2016-06-24 DIAGNOSIS — G629 Polyneuropathy, unspecified: Secondary | ICD-10-CM | POA: Insufficient documentation

## 2016-07-02 DIAGNOSIS — Z23 Encounter for immunization: Secondary | ICD-10-CM | POA: Diagnosis not present

## 2016-07-18 ENCOUNTER — Other Ambulatory Visit: Payer: Self-pay | Admitting: Family Medicine

## 2016-07-18 ENCOUNTER — Telehealth: Payer: Self-pay

## 2016-07-18 DIAGNOSIS — F409 Phobic anxiety disorder, unspecified: Secondary | ICD-10-CM

## 2016-07-18 MED ORDER — ALPRAZOLAM 0.5 MG PO TABS: 0.5000 mg | ORAL_TABLET | Freq: Two times a day (BID) | ORAL | 3 refills | Status: DC | PRN

## 2016-07-18 NOTE — Telephone Encounter (Signed)
Refill request received from [facility] [phone]) requesting ALPRAZolam Donna Mcneil) 0.5 MG tablet

## 2016-07-19 NOTE — Progress Notes (Signed)
Prescription for Alprazolam has been called into [place]. KW

## 2016-07-25 DIAGNOSIS — J42 Unspecified chronic bronchitis: Secondary | ICD-10-CM | POA: Diagnosis not present

## 2016-07-25 DIAGNOSIS — R270 Ataxia, unspecified: Secondary | ICD-10-CM | POA: Diagnosis not present

## 2016-07-25 DIAGNOSIS — M0579 Rheumatoid arthritis with rheumatoid factor of multiple sites without organ or systems involvement: Secondary | ICD-10-CM | POA: Diagnosis not present

## 2016-07-25 DIAGNOSIS — G629 Polyneuropathy, unspecified: Secondary | ICD-10-CM | POA: Diagnosis not present

## 2016-07-28 ENCOUNTER — Ambulatory Visit (INDEPENDENT_AMBULATORY_CARE_PROVIDER_SITE_OTHER): Payer: Medicare Other | Admitting: Family Medicine

## 2016-07-28 ENCOUNTER — Encounter: Payer: Self-pay | Admitting: Family Medicine

## 2016-07-28 VITALS — BP 122/78 | HR 86 | Temp 98.2°F | Resp 16 | Wt 157.2 lb

## 2016-07-28 DIAGNOSIS — G629 Polyneuropathy, unspecified: Secondary | ICD-10-CM

## 2016-07-28 DIAGNOSIS — E782 Mixed hyperlipidemia: Secondary | ICD-10-CM | POA: Diagnosis not present

## 2016-07-28 DIAGNOSIS — M059 Rheumatoid arthritis with rheumatoid factor, unspecified: Secondary | ICD-10-CM

## 2016-07-28 DIAGNOSIS — J449 Chronic obstructive pulmonary disease, unspecified: Secondary | ICD-10-CM

## 2016-07-28 DIAGNOSIS — I10 Essential (primary) hypertension: Secondary | ICD-10-CM

## 2016-07-28 NOTE — Progress Notes (Signed)
Patient: Donna Mcneil Female    DOB: 1940-12-10   75 y.o.   MRN: [id number] Visit Date: 07/28/2016  Today's Provider: Vernie Murders, PA   Chief Complaint  Patient presents with  . Hyperlipidemia  . Hypertension  . COPD  . Follow-up   Subjective:    HPI   Hypertension, follow-up:  BP Readings from Last 3 Encounters:  07/28/16 122/78  04/26/16 (!) 152/76  01/22/16 138/82    She was last seen for hypertension 3 months ago.  BP at that visit was 152/76. Management since that visit includes recommended to restrict sodium intake, stop smoking, and continue medications.  .She reports fair compliance with treatment. She is not having side effects.  She is not exercising. She is adherent to low salt diet.   She is experiencing none.  Patient denies chest pain, irregular heart beat and palpitations.   Cardiovascular risk factors include advanced age (older than 68 for men, 51 for women), dyslipidemia and smoking/ tobacco exposure.  Use of agents associated with hypertension: none.   ------------------------------------------------------------------------    Lipid/Cholesterol, Follow-up:   Last seen for this 3 months ago.  Management since that visit includes recommended to restrict sodium intake, stop smoking, and continue medications.    Last Lipid Panel:    Component Value Date/Time   CHOL 155 04/26/2016 1058   TRIG 223 (H) 04/26/2016 1058   HDL 47 04/26/2016 1058   CHOLHDL 3.3 04/26/2016 1058   LDLCALC 63 04/26/2016 1058    She reports fair compliance with treatment. She is not having side effects.   Wt Readings from Last 3 Encounters:  07/28/16 157 lb 3.2 oz (71.3 kg)  04/26/16 159 lb 12.8 oz (72.5 kg)  11/13/15 155 lb (70.3 kg)    ------------------------------------------------------------------------ COPD Follow Up:   3 month follow up. recommended to restrict sodium intake, stop smoking, and continue medications. Some increase in sputum  with sinus drainage this week. Using Albuterol by nebulizer at home 1-2 times a day on average. Some time MDI of Albuterol helpful. Still smoking 5 cigarettes a day.  Past Medical History:  Diagnosis Date  . Diverticulitis   . Hypercholesteremia   . Hypertension   . Osteoporosis   . Rheumatoid arteritis   . Tobacco use    Past Surgical History:  Procedure Laterality Date  . ABDOMINAL HYSTERECTOMY  12/1969  . APPENDECTOMY  1957  . CATARACT EXTRACTION    . CHOLECYSTECTOMY  1997  . LAPAROSCOPIC SIGMOID COLECTOMY  2013   secondary to diverticulitis  . spit seed removal    . TONSILLECTOMY  1945   Family History  Problem Relation Age of Onset  . Alcohol abuse Mother   . Heart disease Father   . Hypertension Father   . Gout Father   . Ulcers Father   . OCD Son   . Prostatitis Paternal Grandfather   . Pneumonia Paternal Grandfather   . Cancer Son    Allergies  Allergen Reactions  . Codeine Other (See Comments)    GI upset  . Cyclosporine Other (See Comments)    Burning eyes  . Other     Other reaction(s): OTHER  . Pregabalin Other (See Comments)    Personality changes-becomes angry  . Zolpidem     Other reaction(s): ITCHING    Previous Medications   ACETAMINOPHEN (TYLENOL) 500 MG TABLET    Take 1,000 mg by mouth every 6 (six) hours as needed.   ALBUTEROL (PROVENTIL) (2.5 MG/3ML) 0.083% NEBULIZER SOLUTION  2.5 mg every 4 (four) hours as needed.    ALPRAZOLAM (XANAX) 0.5 MG TABLET    Take 1 tablet (0.5 mg total) by mouth 2 (two) times daily as needed.   ASPIRIN 81 MG TABLET    Take 81 mg by mouth daily.   ATORVASTATIN (LIPITOR) 20 MG TABLET    Take 1 tablet (20 mg total) by mouth daily.   CALCIUM CARBONATE-VITAMIN D (CALCIUM 600+D) 600-200 MG-UNIT TABS    Take 1 tablet by mouth 2 (two) times daily.    DIPHENHYDRAMINE HCL (BENADRYL ALLERGY PO)    Take by mouth as needed.   ENBREL SURECLICK 50 MG/ML INJECTION    50 mg once a week.    FOLIC ACID (FOLVITE) 664 MCG TABLET     Take 400 mcg by mouth daily.   GABAPENTIN (NEURONTIN) 100 MG CAPSULE    Take 100 mg by mouth 3 (three) times daily.    HYDROCHLOROTHIAZIDE (HYDRODIURIL) 50 MG TABLET    Take 1 tablet (50 mg total) by mouth daily.   METHOTREXATE (RHEUMATREX) 2.5 MG TABLET    Take 15 mg by mouth once a week.    MULTIPLE VITAMINS-MINERALS (MULTIVITAMIN ADULT PO)    Take by mouth daily.    OMEGA 3 1000 MG CAPS    Take 3 capsules by mouth daily.   POTASSIUM 99 MG TABS    Take by mouth daily.   PROAIR HFA 108 (90 BASE) MCG/ACT INHALER    Inhale 2 puffs into the lungs every 6 (six) hours as needed.   RANITIDINE (ZANTAC) 150 MG TABLET    Take 150 mg by mouth 2 (two) times daily.   VITAMIN D, CHOLECALCIFEROL, 1000 UNITS TABS    Take by mouth daily.     Review of Systems  Constitutional: Negative.   Respiratory: Negative.   Cardiovascular: Negative.     Social History  Substance Use Topics  . Smoking status: Current Every Day Smoker    Packs/day: 0.50    Years: 62.00    Types: Cigarettes  . Smokeless tobacco: Never Used  . Alcohol use No   Objective:   BP 122/78 (BP Location: Right Arm, Patient Position: Sitting, Cuff Size: Normal)   Pulse 86   Temp 98.2 F (36.8 C) (Oral)   Resp 16   Wt 157 lb 3.2 oz (71.3 kg)   BMI 26.98 kg/m   Physical Exam  Constitutional: She is oriented to person, place, and time. She appears well-developed and well-nourished. No distress.  HENT:  Head: Normocephalic and atraumatic.  Right Ear: Hearing normal.  Left Ear: Hearing normal.  Nose: Nose normal.  Eyes: Conjunctivae and lids are normal. Right eye exhibits no discharge. Left eye exhibits no discharge. No scleral icterus.  Neck: Neck supple.  Cardiovascular: Normal rate and regular rhythm.   Pulmonary/Chest: Effort normal and breath sounds normal. No respiratory distress.  Slightly distant breath sounds without rales or rhonchi today.  Abdominal: Soft. Bowel sounds are normal.  Musculoskeletal:  History of  seropositive RA - has pains in feet, hands, wrists, shoulders and hips. No joint enlargements or deviation/deformity. Very stiff with intermittent swelling. Weaker grip strength. Crepitus in left knee.  Neurological: She is alert and oriented to person, place, and time.  Numbness and burning in toes and feet bilaterally.   Skin: Skin is intact. No lesion and no rash noted.  Psychiatric: She has a normal mood and affect. Her speech is normal and behavior is normal. Thought content normal.  Assessment & Plan:     1. Essential hypertension Well controlled and tolerating HCTZ without muscle cramps or dizziness. Continue present dosage and recheck labs to assess electrolytes and renal function. Recheck pending reports. - CBC with Differential/Platelet - Comprehensive metabolic panel - TSH  2. Mixed hyperlipidemia Tolerating Lipitor 20 mg qd without side effects. Trying to follow low fat diet. Should walk some for exercise. Continue Omega-3 Fish Oil and recheck CMP, TSH and Lipid panel. Follow up pending reports. - Comprehensive metabolic panel - Lipid panel - TSH  3. COPD (chronic obstructive pulmonary disease) with chronic bronchitis (HCC) Feels stable and using Albuterol by nebulizer at home prn. Still smoking around 5 cigarettes a day. Must stop all smoking. Schedule spirometry at next OV in 3-4 months. Recheck routine labs. - CBC with Differential/Platelet  4. Seropositive rheumatoid arthritis (HCC) Treating with Enbrel injection once a week (for 11 years) with Methotrexate 2.5 mg 6 tablets once a week (for 14 years) and followed routinely by rheumatologist (Dr. Jefm Bryant).  5. Peripheral polyneuropathy ([place]) States this was a side effect of Methotrexate for RA. Dr. Jefm Bryant is treating it with Gabapentin 100 mg TID.

## 2016-07-29 LAB — CBC WITH DIFFERENTIAL/PLATELET
Basophils Absolute: 0.1 10*3/uL (ref 0.0–0.2)
Basos: 1 %
EOS (ABSOLUTE): 0.2 10*3/uL (ref 0.0–0.4)
EOS: 3 %
HEMATOCRIT: 46.1 % (ref 34.0–46.6)
Hemoglobin: 16.6 g/dL — ABNORMAL HIGH (ref 11.1–15.9)
Immature Grans (Abs): 0 10*3/uL (ref 0.0–0.1)
Immature Granulocytes: 0 %
LYMPHS ABS: 2.3 10*3/uL (ref 0.7–3.1)
Lymphs: 35 %
MCH: 34.2 pg — ABNORMAL HIGH (ref 26.6–33.0)
MCHC: 36 g/dL — AB (ref 31.5–35.7)
MCV: 95 fL (ref 79–97)
MONOS ABS: 0.6 10*3/uL (ref 0.1–0.9)
Monocytes: 9 %
Neutrophils Absolute: 3.5 10*3/uL (ref 1.4–7.0)
Neutrophils: 52 %
Platelets: 226 10*3/uL (ref 150–379)
RBC: 4.86 x10E6/uL (ref 3.77–5.28)
RDW: 13.7 % (ref 12.3–15.4)
WBC: 6.7 10*3/uL (ref 3.4–10.8)

## 2016-07-29 LAB — COMPREHENSIVE METABOLIC PANEL
A/G RATIO: 1.5 (ref 1.2–2.2)
ALK PHOS: 56 IU/L (ref 39–117)
ALT: 27 IU/L (ref 0–32)
AST: 24 IU/L (ref 0–40)
Albumin: 4.7 g/dL (ref 3.5–4.8)
BUN/Creatinine Ratio: 16 (ref 12–28)
BUN: 12 mg/dL (ref 8–27)
Bilirubin Total: 0.6 mg/dL (ref 0.0–1.2)
CO2: 25 mmol/L (ref 18–29)
Calcium: 9.9 mg/dL (ref 8.7–10.3)
Chloride: 95 mmol/L — ABNORMAL LOW (ref 96–106)
Creatinine, Ser: 0.74 mg/dL (ref 0.57–1.00)
GFR calc Af Amer: 91 mL/min/{1.73_m2} (ref >59)
GFR calc non Af Amer: 79 mL/min/{1.73_m2} (ref >59)
GLOBULIN, TOTAL: 3.1 g/dL (ref 1.5–4.5)
Glucose: 99 mg/dL (ref 65–99)
POTASSIUM: 3.9 mmol/L (ref 3.5–5.2)
SODIUM: 139 mmol/L (ref 134–144)
Total Protein: 7.8 g/dL (ref 6.0–8.5)

## 2016-07-29 LAB — LIPID PANEL
CHOLESTEROL TOTAL: 169 mg/dL (ref 100–199)
Chol/HDL Ratio: 3.5 ratio units (ref 0.0–4.4)
HDL: 48 mg/dL (ref >39)
LDL Calculated: 73 mg/dL (ref 0–99)
TRIGLYCERIDES: 240 mg/dL — AB (ref 0–149)
VLDL Cholesterol Cal: 48 mg/dL — ABNORMAL HIGH (ref 5–40)

## 2016-07-29 LAB — TSH: TSH: 0.809 u[IU]/mL (ref 0.450–4.500)

## 2016-08-02 DIAGNOSIS — H04123 Dry eye syndrome of bilateral lacrimal glands: Secondary | ICD-10-CM | POA: Diagnosis not present

## 2016-08-02 DIAGNOSIS — H43313 Vitreous membranes and strands, bilateral: Secondary | ICD-10-CM | POA: Diagnosis not present

## 2016-08-02 DIAGNOSIS — H2101 Hyphema, right eye: Secondary | ICD-10-CM | POA: Diagnosis not present

## 2016-09-19 ENCOUNTER — Other Ambulatory Visit: Payer: Self-pay | Admitting: Family Medicine

## 2016-09-19 DIAGNOSIS — J449 Chronic obstructive pulmonary disease, unspecified: Secondary | ICD-10-CM

## 2016-09-26 ENCOUNTER — Other Ambulatory Visit: Payer: Self-pay | Admitting: Family Medicine

## 2016-09-26 ENCOUNTER — Telehealth: Payer: Self-pay

## 2016-09-26 DIAGNOSIS — J449 Chronic obstructive pulmonary disease, unspecified: Secondary | ICD-10-CM

## 2016-09-26 MED ORDER — ALBUTEROL SULFATE HFA 108 (90 BASE) MCG/ACT IN AERS: 2.0000 | INHALATION_SPRAY | Freq: Four times a day (QID) | RESPIRATORY_TRACT | 4 refills | Status: DC | PRN

## 2016-09-26 NOTE — Telephone Encounter (Signed)
Pt called stating that she recently changed her pharmacy to [facility]. Pt states her ProAir is not on their formulary. Please advise. Renaldo Fiddler, CMA

## 2016-09-26 NOTE — Telephone Encounter (Signed)
Have her check with [facility] to see if Proventil or Ventolin is on their formulary.

## 2016-09-26 NOTE — Telephone Encounter (Signed)
Pt advised to check with pharmacy. Renaldo Fiddler, CMA

## 2016-10-27 ENCOUNTER — Ambulatory Visit (INDEPENDENT_AMBULATORY_CARE_PROVIDER_SITE_OTHER): Payer: Medicare Other | Admitting: Family Medicine

## 2016-10-27 ENCOUNTER — Encounter: Payer: Self-pay | Admitting: Family Medicine

## 2016-10-27 VITALS — BP 148/78 | HR 96 | Temp 97.8°F | Resp 20 | Wt 158.0 lb

## 2016-10-27 DIAGNOSIS — F409 Phobic anxiety disorder, unspecified: Secondary | ICD-10-CM

## 2016-10-27 DIAGNOSIS — M059 Rheumatoid arthritis with rheumatoid factor, unspecified: Secondary | ICD-10-CM

## 2016-10-27 DIAGNOSIS — G629 Polyneuropathy, unspecified: Secondary | ICD-10-CM

## 2016-10-27 DIAGNOSIS — I10 Essential (primary) hypertension: Secondary | ICD-10-CM

## 2016-10-27 DIAGNOSIS — E782 Mixed hyperlipidemia: Secondary | ICD-10-CM

## 2016-10-27 DIAGNOSIS — J449 Chronic obstructive pulmonary disease, unspecified: Secondary | ICD-10-CM

## 2016-10-27 NOTE — Progress Notes (Signed)
Patient: Donna Mcneil Female    DOB: December 18, 1940   76 y.o.   MRN: [id number] Visit Date: 10/27/2016  Today's Provider: Vernie Murders, PA   Chief Complaint  Patient presents with  . Hypertension  . Hyperlipidemia  . COPD  . Follow-up   Subjective:    HPI  Hypertension, follow-up:  BP Readings from Last 3 Encounters:  10/27/16 (!) 148/78  07/28/16 122/78  04/26/16 (!) 152/76    She was last seen for hypertension 3 months ago.  BP at that visit was 122/78. Management since that visit includes continue medication.She reports good compliance with treatment. She is not having side effects.  She is not exercising. She is adherent to low salt diet.   Outside blood pressures are being checked. She is experiencing none.  Patient denies chest pain, chest pressure/discomfort, irregular heart beat and palpitations.   Cardiovascular risk factors include advanced age (older than 22 for men, 45 for women), dyslipidemia and smoking/ tobacco exposure.  Use of agents associated with hypertension: none.   ------------------------------------------------------------------------    Lipid/Cholesterol, Follow-up:   Last seen for this 3 months ago.  Management since that visit includes encouraged low fat diet and continue medication.  Last Lipid Panel:    Component Value Date/Time   CHOL 169 07/28/2016 1045   TRIG 240 (H) 07/28/2016 1045   HDL 48 07/28/2016 1045   CHOLHDL 3.5 07/28/2016 1045   LDLCALC 73 07/28/2016 1045    She reports good compliance with treatment. She is not having side effects.   Wt Readings from Last 3 Encounters:  10/27/16 158 lb (71.7 kg)  07/28/16 157 lb 3.2 oz (71.3 kg)  04/26/16 159 lb 12.8 oz (72.5 kg)    ------------------------------------------------------------------------ COPD: 3 month follow up. Symptoms stable using nebulizer at home PRN. Encouraged patient to stop all smoking. Patient reports poor compliance with recommendation.    Past Medical History:  Diagnosis Date  . Diverticulitis   . Hypercholesteremia   . Hypertension   . Osteoporosis   . Rheumatoid arteritis   . Tobacco use    Patient Active Problem List   Diagnosis Date Noted  . Peripheral neuropathy ([place]) 06/24/2016  . 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 COPD (chronic obstructive pulmonary disease) with chronic bronchitis ([place]) 11/13/2015  . Osteoarthritis 10/05/2015  . Hyperlipidemia 10/05/2015  . Hypertension 10/05/2015  . Osteoporosis 10/05/2015  . Acid reflux 10/05/2015  . Peptic ulcer 10/05/2015  . Cataract 10/05/2015  . Tendinitis 10/05/2015  . Osteopenia 02/11/2014  . Seropositive rheumatoid arthritis ([place]) 02/11/2014   Past Surgical History:  Procedure Laterality Date  . ABDOMINAL HYSTERECTOMY  12/1969  . APPENDECTOMY  1957  . CATARACT EXTRACTION    . CHOLECYSTECTOMY  1997  . LAPAROSCOPIC SIGMOID COLECTOMY  2013   secondary to diverticulitis  . spit seed removal    . TONSILLECTOMY  1945   Family History  Problem Relation Age of Onset  . Alcohol abuse Mother   . Heart disease Father   . Hypertension Father   . Gout Father   . Ulcers Father   . OCD Son   . Prostatitis Paternal Grandfather   . Pneumonia Paternal Grandfather   . Cancer Son    Allergies  Allergen Reactions  . Codeine Other (See Comments)    GI upset  . Cyclosporine Other (See Comments)    Burning eyes  . Other     Other reaction(s): OTHER  . Pregabalin Other (See Comments)    Personality changes-becomes  angry  . Zolpidem     Other reaction(s): ITCHING     Previous Medications   ACETAMINOPHEN (TYLENOL) 500 MG TABLET    Take 1,000 mg by mouth every 6 (six) hours as needed.   ALBUTEROL (PROVENTIL) (2.5 MG/3ML) 0.083% NEBULIZER SOLUTION    2.5 mg every 4 (four) hours as needed.    ALBUTEROL (VENTOLIN HFA) 108 (90 BASE) MCG/ACT INHALER    Inhale 2 puffs into the lungs every 6 (six) hours as needed for wheezing or shortness of breath.   ALPRAZOLAM  (XANAX) 0.5 MG TABLET    Take 1 tablet (0.5 mg total) by mouth 2 (two) times daily as needed.   ASPIRIN 81 MG TABLET    Take 81 mg by mouth daily.   ATORVASTATIN (LIPITOR) 20 MG TABLET    TAKE 1 TABLET BY MOUTH DAILY   CALCIUM CARBONATE-VITAMIN D (CALCIUM 600+D) 600-200 MG-UNIT TABS    Take 1 tablet by mouth 2 (two) times daily.    DIPHENHYDRAMINE HCL (BENADRYL ALLERGY PO)    Take by mouth as needed.   ENBREL SURECLICK 50 MG/ML INJECTION    50 mg once a week.    FOLIC ACID (FOLVITE) 272 MCG TABLET    Take 400 mcg by mouth daily.   GABAPENTIN (NEURONTIN) 100 MG CAPSULE    Take 100 mg by mouth 3 (three) times daily.    HYDROCHLOROTHIAZIDE (HYDRODIURIL) 50 MG TABLET    TAKE 1 TABLET BY MOUTH DAILY   METHOTREXATE (RHEUMATREX) 2.5 MG TABLET    Take 15 mg by mouth once a week.    MULTIPLE VITAMINS-MINERALS (MULTIVITAMIN ADULT PO)    Take by mouth daily.    OMEGA 3 1000 MG CAPS    Take 3 capsules by mouth daily.   POTASSIUM 99 MG TABS    Take by mouth daily.   RANITIDINE (ZANTAC) 150 MG TABLET    Take 150 mg by mouth 2 (two) times daily.   VITAMIN D, CHOLECALCIFEROL, 1000 UNITS TABS    Take by mouth daily.     Review of Systems  Constitutional: Negative.   HENT: Positive for congestion.   Respiratory: Negative.   Cardiovascular: Negative.   Musculoskeletal: Negative.     Social History  Substance Use Topics  . Smoking status: Current Every Day Smoker    Packs/day: 0.50    Years: 62.00    Types: Cigarettes  . Smokeless tobacco: Never Used  . Alcohol use No   Objective:   BP (!) 148/78 (BP Location: Right Arm, Patient Position: Sitting, Cuff Size: Normal)   Pulse 96   Temp 97.8 F (36.6 C) (Oral)   Resp 20   Wt 158 lb (71.7 kg)   SpO2 95%   BMI 27.12 kg/m   Physical Exam  Constitutional: She is oriented to person, place, and time. She appears well-developed and well-nourished.  HENT:  Head: Normocephalic.  Right Ear: External ear normal.  Left Ear: External ear normal.    Nose: Nose normal.  Mouth/Throat: Oropharynx is clear and moist.  Eyes: Conjunctivae and EOM are normal.  Neck: Neck supple.  Cardiovascular: Normal rate and regular rhythm.   Pulmonary/Chest:  Distant breath sounds without wheeze or rales today.  Abdominal: Soft.  Musculoskeletal:  History of seropositive RA - has pains in feet, hands, wrists, shoulders and hips. No joint enlargements or deviation/deformity. Very stiff with intermittent swelling. Weaker grip strength. Crepitus in left knee.   Lymphadenopathy:    She has no cervical adenopathy.  Neurological: She is alert and oriented to person, place, and time.  Numbness and burning balls of feet to tips of toes bilaterally from side effect of methotrexate.  Psychiatric: Her behavior is normal. Her mood appears anxious.      Assessment & Plan:     1. COPD (chronic obstructive pulmonary disease) with chronic bronchitis (HCC) Feels breathing and dyspnea stable. Tolerating Ventolin-HFA prn and rarely uses Albuterol by nebulizer 2.5 mg q 4 hours prn. Pulse oximetry 95% today and still smoking. Spirometry shows very severe obstruction with low vital capacity. Still smoking 1/2 ppd and declines assistance with smoking cessation programs. May need referral to a pulmonologist if dyspnea worsens. - Spirometry with graph  2. Essential hypertension Stable control of BP with use of HCTZ 50 mg qd. Restricts sodium intake and trying to maintain weight. Check follow up labs and follow up pending reports. Admits to some "White Coat Syndrome". - CBC with Differential/Platelet - Comprehensive metabolic panel - TSH  3. Peripheral polyneuropathy ([place]) States this has been present in feet as a side effect from Methotrexate use and rheumatologist (Dr. Jefm Bryant) has had her on Gabapentin 100 mg TID to help with control. Recheck labs and follow up with him as planned. - CBC with Differential/Platelet - Comprehensive metabolic panel - TSH  4.  Seropositive rheumatoid arthritis (HCC) Followed by Dr. Jefm Bryant (rheumatologist) and taking Methotrexate 15 mg a week, Enbrel 50 mg a week and Tylenol for multiple joint pains. Continue regular follow up with Dr. Lindon Romp and recheck CBC with CMP. - CBC with Differential/Platelet - Comprehensive metabolic panel  5. Phobic anxiety disorder Unchanged and feels the panic is well controlled with intermittent use of the Alprazolam 0.5 mg BID prn only.  6. Mixed hyperlipidemia Tolerating Atorvastatin 20 mg qd without additional aches and pains. Recheck labs. Continue low fat diet. - Comprehensive metabolic panel - Lipid panel - TSH

## 2016-10-28 ENCOUNTER — Telehealth: Payer: Self-pay

## 2016-10-28 LAB — CBC WITH DIFFERENTIAL/PLATELET
BASOS: 1 %
Basophils Absolute: 0.1 10*3/uL (ref 0.0–0.2)
EOS (ABSOLUTE): 0.2 10*3/uL (ref 0.0–0.4)
Eos: 3 %
HEMOGLOBIN: 16.2 g/dL — AB (ref 11.1–15.9)
Hematocrit: 44.6 % (ref 34.0–46.6)
IMMATURE GRANS (ABS): 0 10*3/uL (ref 0.0–0.1)
IMMATURE GRANULOCYTES: 0 %
LYMPHS: 30 %
Lymphocytes Absolute: 2.1 10*3/uL (ref 0.7–3.1)
MCH: 34.2 pg — AB (ref 26.6–33.0)
MCHC: 36.3 g/dL — ABNORMAL HIGH (ref 31.5–35.7)
MCV: 94 fL (ref 79–97)
Monocytes Absolute: 0.9 10*3/uL (ref 0.1–0.9)
Monocytes: 12 %
NEUTROS ABS: 3.9 10*3/uL (ref 1.4–7.0)
NEUTROS PCT: 54 %
PLATELETS: 227 10*3/uL (ref 150–379)
RBC: 4.74 x10E6/uL (ref 3.77–5.28)
RDW: 13.8 % (ref 12.3–15.4)
WBC: 7.2 10*3/uL (ref 3.4–10.8)

## 2016-10-28 LAB — COMPREHENSIVE METABOLIC PANEL
A/G RATIO: 1.4 (ref 1.2–2.2)
ALBUMIN: 4.6 g/dL (ref 3.5–4.8)
ALT: 25 IU/L (ref 0–32)
AST: 26 IU/L (ref 0–40)
Alkaline Phosphatase: 52 IU/L (ref 39–117)
BILIRUBIN TOTAL: 0.5 mg/dL (ref 0.0–1.2)
BUN / CREAT RATIO: 20 (ref 12–28)
BUN: 14 mg/dL (ref 8–27)
CALCIUM: 10.1 mg/dL (ref 8.7–10.3)
CHLORIDE: 98 mmol/L (ref 96–106)
CO2: 21 mmol/L (ref 18–29)
Creatinine, Ser: 0.71 mg/dL (ref 0.57–1.00)
GFR, EST AFRICAN AMERICAN: 96 mL/min/{1.73_m2} (ref >59)
GFR, EST NON AFRICAN AMERICAN: 83 mL/min/{1.73_m2} (ref >59)
Globulin, Total: 3.2 g/dL (ref 1.5–4.5)
Glucose: 97 mg/dL (ref 65–99)
POTASSIUM: 4.5 mmol/L (ref 3.5–5.2)
Sodium: 140 mmol/L (ref 134–144)
TOTAL PROTEIN: 7.8 g/dL (ref 6.0–8.5)

## 2016-10-28 LAB — LIPID PANEL
CHOL/HDL RATIO: 3.4 ratio (ref 0.0–4.4)
Cholesterol, Total: 160 mg/dL (ref 100–199)
HDL: 47 mg/dL (ref >39)
LDL Calculated: 71 mg/dL (ref 0–99)
Triglycerides: 209 mg/dL — ABNORMAL HIGH (ref 0–149)
VLDL CHOLESTEROL CAL: 42 mg/dL — AB (ref 5–40)

## 2016-10-28 LAB — TSH: TSH: 0.838 u[IU]/mL (ref 0.450–4.500)

## 2016-10-28 NOTE — Telephone Encounter (Signed)
Patient advised.

## 2016-10-28 NOTE — Telephone Encounter (Signed)
-----   Message from [place], [place] sent at 10/28/2016 12:45 PM EST ----- All blood tests essentially normal except triglycerides still high (better than last check 3 months ago). Continue present medications and add Krill Oil [phone] mg qd. Recheck levels after physical in June.

## 2016-11-21 ENCOUNTER — Other Ambulatory Visit: Payer: Self-pay | Admitting: Family Medicine

## 2016-11-21 DIAGNOSIS — F409 Phobic anxiety disorder, unspecified: Secondary | ICD-10-CM

## 2016-11-21 NOTE — Telephone Encounter (Signed)
RX called in at [facility]  

## 2016-11-21 NOTE — Telephone Encounter (Signed)
Phone in refill to her pharmacy as authorized in medication list.

## 2016-12-07 ENCOUNTER — Telehealth: Payer: Self-pay | Admitting: Family Medicine

## 2016-12-07 NOTE — Telephone Encounter (Signed)
Called Pt to schedule AWV with [facility] move CPE w/Dennis to day that Donna Mcneil works- [employer]

## 2016-12-21 ENCOUNTER — Other Ambulatory Visit: Payer: Self-pay | Admitting: Family Medicine

## 2016-12-21 DIAGNOSIS — F409 Phobic anxiety disorder, unspecified: Secondary | ICD-10-CM

## 2016-12-22 NOTE — Telephone Encounter (Signed)
RX called in at [facility]  

## 2016-12-23 ENCOUNTER — Telehealth: Payer: Self-pay | Admitting: Family Medicine

## 2016-12-23 NOTE — Telephone Encounter (Signed)
Ask pharmacy if the Albuterol solution in another brand is covered or do we need to switch to Duoneb 1 vial (2m) QID prn wheeze.

## 2016-12-23 NOTE — Telephone Encounter (Signed)
Pt states she called pharmacy to get this filled and pt's insurance dose not pay for this medication anymore pt would like to know what other medication she can take similar to this that her insurance will cover. Pt states she has enough to get her thought Saturday. Pt use's [employer].  Thanks CC

## 2016-12-23 NOTE — Telephone Encounter (Signed)
Please call to see what is needed for PA and let patient know if this medication is covered by part B her pharmacy is not contracted to handle it. Should use the Proventil-HFA inhaler she has at home instead of the nebulizer.

## 2016-12-23 NOTE — Telephone Encounter (Signed)
Please review, its in regards to below medication-aa (albuterol (PROVENTIL) (2.5 MG/3ML) 0.083% nebulizer solution )

## 2016-12-23 NOTE — Telephone Encounter (Signed)
Please work on this, not able to start today. I was on triage. Thanks-aa

## 2016-12-23 NOTE — Telephone Encounter (Signed)
Spoke with pharmacist and the issue is for any of the nebulizers for her, when they run it with insurance message comes up saying needs PA part B vs part D medication determination. Need to call [phone] and answer some questions and then they would determine which part it is with medicare. He said if it ends up been Part B medicare medication this pharmacy is not contracted with that part and that would be a problem for the patient due to transportation issues-aa

## 2016-12-26 ENCOUNTER — Other Ambulatory Visit: Payer: Self-pay | Admitting: Family Medicine

## 2016-12-26 NOTE — Telephone Encounter (Signed)
Will send prescription to the [facility].

## 2016-12-26 NOTE — Telephone Encounter (Signed)
PA was submitted and denied on 12/23/2016. Patient has Medicare Part B.

## 2016-12-26 NOTE — Telephone Encounter (Signed)
Patient advised. She states it will be hard to go to a different pharmacy due to lack of transportation. She states the medication will cost $14 per month out of pocket, so she will pay cash for RX. She has a follow up appointment scheduled for June 4th and states she will discuss medication with Simona Huh at that visit.   FYI: Patient wanted to let Simona Huh know she is now having to use a walker when going out of the home.

## 2017-01-30 ENCOUNTER — Ambulatory Visit: Payer: Medicare Other | Admitting: Family Medicine

## 2017-01-30 ENCOUNTER — Encounter: Payer: Self-pay | Admitting: Family Medicine

## 2017-01-30 ENCOUNTER — Ambulatory Visit (INDEPENDENT_AMBULATORY_CARE_PROVIDER_SITE_OTHER): Payer: Medicare Other | Admitting: Family Medicine

## 2017-01-30 VITALS — BP 140/90 | HR 85 | Temp 98.1°F | Resp 22 | Ht 62.5 in | Wt 156.0 lb

## 2017-01-30 DIAGNOSIS — M059 Rheumatoid arthritis with rheumatoid factor, unspecified: Secondary | ICD-10-CM | POA: Diagnosis not present

## 2017-01-30 DIAGNOSIS — G629 Polyneuropathy, unspecified: Secondary | ICD-10-CM | POA: Diagnosis not present

## 2017-01-30 DIAGNOSIS — Z0000 Encounter for general adult medical examination without abnormal findings: Secondary | ICD-10-CM | POA: Diagnosis not present

## 2017-01-30 DIAGNOSIS — E782 Mixed hyperlipidemia: Secondary | ICD-10-CM | POA: Diagnosis not present

## 2017-01-30 DIAGNOSIS — F409 Phobic anxiety disorder, unspecified: Secondary | ICD-10-CM

## 2017-01-30 DIAGNOSIS — I10 Essential (primary) hypertension: Secondary | ICD-10-CM | POA: Diagnosis not present

## 2017-01-30 DIAGNOSIS — J449 Chronic obstructive pulmonary disease, unspecified: Secondary | ICD-10-CM

## 2017-01-30 NOTE — Progress Notes (Signed)
Patient: Donna Mcneil, Female    DOB: April 30, 1941, 76 y.o.   MRN: [id number] Visit Date: 01/30/2017  Today's Provider: Vernie Murders, PA   Chief Complaint  Patient presents with  . Medicare Wellness   Subjective:    Annual wellness visit Donna Mcneil is a 76 y.o. female. She feels fairly well. She reports exercising none due to COPD, neuropathy. She reports she is sleeping poorly.  Last AWV- unknown; was re-established on 10/15/2015. Last BMD- [facility]- 07/27/2015- osteopenia Pt is s/p hysterectomy due to endometriosis. Pt also had sigmoid colectomy due to diverticulitis; she refuses colonoscopy.  -----------------------------------------------------------   Review of Systems  Constitutional: Positive for fatigue. Negative for activity change, appetite change, chills, diaphoresis, fever and unexpected weight change.  HENT: Positive for congestion, postnasal drip, sinus pain and sneezing. Negative for dental problem, drooling, ear discharge, ear pain, facial swelling, hearing loss, mouth sores, nosebleeds, rhinorrhea, sinus pressure, sore throat, tinnitus, trouble swallowing and voice change.   Eyes: Positive for itching. Negative for photophobia, pain, discharge, redness and visual disturbance.  Respiratory: Positive for cough, shortness of breath and wheezing. Negative for apnea, choking, chest tightness and stridor.   Cardiovascular: Positive for leg swelling. Negative for chest pain and palpitations.  Gastrointestinal: Negative.   Endocrine: Positive for heat intolerance. Negative for cold intolerance, polydipsia, polyphagia and polyuria.  Genitourinary: Negative.   Musculoskeletal: Positive for arthralgias, back pain, joint swelling, neck pain and neck stiffness. Negative for gait problem and myalgias.  Skin: Positive for rash. Negative for color change, pallor and wound.  Allergic/Immunologic: Positive for environmental allergies. Negative for food  allergies and immunocompromised state.  Neurological: Positive for weakness, light-headedness and numbness. Negative for dizziness, tremors, seizures, syncope, facial asymmetry, speech difficulty and headaches.  Hematological: Negative.   Psychiatric/Behavioral: Positive for sleep disturbance. Negative for agitation, behavioral problems, confusion, decreased concentration, dysphoric mood, hallucinations, self-injury and suicidal ideas. The patient is nervous/anxious. The patient is not hyperactive.     Social History   Social History  . Marital status: Divorced    Spouse name: N/A  . Number of children: N/A  . Years of education: N/A   Occupational History  . Not on file.   Social History Main Topics  . Smoking status: Current Every Day Smoker    Packs/day: 0.50    Types: Cigarettes  . Smokeless tobacco: Never Used     Comment: started smoking at age 15  . Alcohol use No  . Drug use: No  . Sexual activity: Not on file   Other Topics Concern  . Not on file   Social History Narrative  . No narrative on file    Past Medical History:  Diagnosis Date  . Diverticulitis   . Hypercholesteremia   . Hypertension   . Osteoporosis   . Tobacco use      Patient Active Problem List   Diagnosis Date Noted  . Peripheral neuropathy 06/24/2016  . 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 COPD (chronic obstructive pulmonary disease) with chronic bronchitis ([place]) 11/13/2015  . Osteoarthritis 10/05/2015  . Hyperlipidemia 10/05/2015  . Hypertension 10/05/2015  . Osteoporosis 10/05/2015  . Acid reflux 10/05/2015  . Peptic ulcer 10/05/2015  . Cataract 10/05/2015  . Tendinitis 10/05/2015  . Osteopenia 02/11/2014  . Seropositive rheumatoid arthritis ([place]) 02/11/2014    Past Surgical History:  Procedure Laterality Date  . ABDOMINAL HYSTERECTOMY  12/1969  . APPENDECTOMY  1957  . CATARACT EXTRACTION    .  CHOLECYSTECTOMY  1997  . LAPAROSCOPIC SIGMOID COLECTOMY  2013   secondary to  diverticulitis  . spit seed removal    . TONSILLECTOMY  1945    Her family history includes Alcohol abuse in her brother and mother; Cancer in her son; Cirrhosis in her mother; Gout in her father; Heart disease in her brother and father; Hyperlipidemia in her father; Hypertension in her father; OCD in her son; Pneumonia in her paternal grandfather; Prostatitis in her paternal grandfather; Ulcers in her father.      Current Outpatient Prescriptions:  .  acetaminophen (TYLENOL) 500 MG tablet, Take 1,000 mg by mouth every 6 (six) hours as needed., Disp: , Rfl:  .  albuterol (VENTOLIN HFA) 108 (90 Base) MCG/ACT inhaler, Inhale 2 puffs into the lungs every 6 (six) hours as needed for wheezing or shortness of breath., Disp: 8 g, Rfl: 4 .  ALPRAZolam (XANAX) 0.5 MG tablet, TAKE 1 TABLET BY MOUTH TWICE A DAY AS NEEDED, Disp: 60 tablet, Rfl: 2 .  aspirin 81 MG tablet, Take 81 mg by mouth daily., Disp: , Rfl:  .  atorvastatin (LIPITOR) 20 MG tablet, TAKE 1 TABLET BY MOUTH DAILY, Disp: 30 tablet, Rfl: 12 .  BIOTIN PO, Take by mouth., Disp: , Rfl:  .  Calcium Carbonate-Vitamin D (CALCIUM 600+D) 600-200 MG-UNIT TABS, Take 1 tablet by mouth 2 (two) times daily. , Disp: , Rfl:  .  Carboxymethylcellul-Glycerin (OPTIVE) 0.5-0.9 % SOLN, Apply to eye., Disp: , Rfl:  .  DiphenhydrAMINE HCl (BENADRYL ALLERGY PO), Take by mouth as needed., Disp: , Rfl:  .  docusate sodium (COLACE) 100 MG capsule, Take 100 mg by mouth 2 (two) times daily., Disp: , Rfl:  .  ENBREL SURECLICK 50 MG/ML injection, 50 mg once a week. , Disp: , Rfl:  .  folic acid (FOLVITE) 751 MCG tablet, Take 400 mcg by mouth daily., Disp: , Rfl:  .  gabapentin (NEURONTIN) 100 MG capsule, Take 100 mg by mouth 3 (three) times daily. , Disp: , Rfl:  .  hydrochlorothiazide (HYDRODIURIL) 50 MG tablet, TAKE 1 TABLET BY MOUTH DAILY, Disp: 30 tablet, Rfl: 12 .  KRILL OIL PO, Take by mouth., Disp: , Rfl:  .  methotrexate (RHEUMATREX) 2.5 MG tablet, Take 15 mg  by mouth once a week. , Disp: , Rfl:  .  mometasone (ELOCON) 0.1 % cream, Apply 1 application topically daily., Disp: , Rfl:  .  Multiple Vitamins-Minerals (MULTIVITAMIN ADULT PO), Take by mouth daily. , Disp: , Rfl:  .  Omega 3 1000 MG CAPS, Take 3 capsules by mouth daily., Disp: , Rfl:  .  Potassium 99 MG TABS, Take by mouth daily., Disp: , Rfl:  .  ranitidine (ZANTAC) 150 MG tablet, Take 150 mg by mouth 2 (two) times daily., Disp: , Rfl:  .  Vitamin D, Cholecalciferol, 1000 units TABS, Take by mouth daily. , Disp: , Rfl:  .  albuterol (PROVENTIL) (2.5 MG/3ML) 0.083% nebulizer solution, USE 1 VIAL IN NEBULIZER AS DIRECTED EVERY 4 HOURS AS NEEDED (Patient not taking: Reported on 01/30/2017), Disp: 180 mL, Rfl: 3  Patient Care Team: [name], [name], PA as PCP - General (Family Medicine)     Objective:   Vitals: BP 140/90 (BP Location: Right Arm, Patient Position: Sitting, Cuff Size: Normal)   Pulse 85   Temp 98.1 F (36.7 C) (Oral)   Resp (!) 22   Ht 5' 2.5" (1.588 m)   Wt 156 lb (70.8 kg)   SpO2  94%   BMI 28.08 kg/m   Physical Exam  Constitutional: She is oriented to person, place, and time. She appears well-developed and well-nourished.  HENT:  Head: Normocephalic and atraumatic.  Right Ear: External ear normal.  Left Ear: External ear normal.  Nose: Nose normal.  Mouth/Throat: Oropharynx is clear and moist.  Eyes: Conjunctivae and EOM are normal. Pupils are equal, round, and reactive to light. Right eye exhibits no discharge.  Neck: Normal range of motion. Neck supple. No tracheal deviation present. No thyromegaly present.  Cardiovascular: Normal rate, regular rhythm, normal heart sounds and intact distal pulses.   No murmur heard. Pulmonary/Chest: Effort normal. No respiratory distress. She has no wheezes. She has no rales. She exhibits no tenderness.  Distant breath sounds without rales or rhonchi.  Abdominal: Soft. She exhibits no distension and no mass. There is no  tenderness. There is no rebound and no guarding.  Musculoskeletal: Normal range of motion. She exhibits no edema or tenderness.  Some joint stiffness and seropositive rheumatoid arthritis followed by Dr. Jefm Bryant (rheumatologist). No nodules or swelling today. Pains and stiffness in the mornings involve the hands, wrists, shoulder and knees. Some crepitus felt in the left knee, only.  Lymphadenopathy:    She has no cervical adenopathy.  Neurological: She is alert and oriented to person, place, and time. She has normal reflexes. No cranial nerve deficit. She exhibits normal muscle tone. Coordination normal.  Skin: Skin is warm and dry. No rash noted. No erythema.  Psychiatric: Her behavior is normal. Judgment and thought content normal. Her mood appears anxious.    Activities of Daily Living In your present state of health, do you have any difficulty performing the following activities: 01/30/2017  Hearing? N  Vision? N  Difficulty concentrating or making decisions? Y  Walking or climbing stairs? Y  Dressing or bathing? N  Doing errands, shopping? Y  Some recent data might be hidden    Fall Risk Assessment Fall Risk  01/30/2017 10/27/2016  Falls in the past year? No No     Depression Screen PHQ 2/9 Scores 01/30/2017 10/27/2016 10/05/2015  PHQ - 2 Score 2 2 4   PHQ- 9 Score 6 6 12     Cognitive Testing - 6-CIT  Correct? Score   What year is it? yes 0 0 or 4  What month is it? yes 0 0 or 3  Memorize:    Pia Mau,  41,  [place],      What time is it? (within 1 hour) yes 0 0 or 3  Count backwards from 20 no 2 0, 2, or 4  Name the months of the year yes 0 0, 2, or 4  Repeat name & address above no 4 0, 2, 4, 6, 8, or 10       TOTAL SCORE  6/28   Interpretation:  Normal  Normal (0-7) Abnormal (8-28)    Audit-C Alcohol Use Screening  Question Answer Points  How often do you have alcoholic drink? never 0  On days you do drink alcohol, how many drinks do you typically  consume? N/A 0  How oftey will you drink 6 or more in a total? never 0  Total Score:  0   A score of 3 or more in women, and 4 or more in men indicates increased risk for alcohol abuse, EXCEPT if all of the points are from question 1.    Assessment & Plan:     Annual Wellness Visit  Reviewed patient's Family Medical History Reviewed and updated list of patient's medical providers Assessment of cognitive impairment was done Assessed patient's functional ability Established a written schedule for health screening [place] Completed and Reviewed  Exercise Activities and Dietary recommendations Goals    Unable to exercise much due to COPD and RA.      Immunization History  Administered Date(s) Administered  . Influenza Split 08/15/2014  . Influenza, Seasonal, Injecte, Preservative Fre 07/12/2010, 07/04/2011  . Influenza-Unspecified 07/17/2015, 07/02/2016  . Pneumococcal Polysaccharide-23 07/12/2010, 07/17/2015    Health Maintenance  Topic Date Due  . TETANUS/TDAP  07/11/1959  . PNA vac Low Risk Adult (2 of 2 - PCV13) 07/16/2016  . INFLUENZA VACCINE  03/29/2017  . DEXA SCAN  Completed     Discussed health benefits of physical activity, and encouraged her to engage in regular exercise appropriate for her age and condition.    ------------------------------------------------------------------------------------------------------------ 1. Encounter for Medicare annual wellness exam General health stable. Rheumatoid arthritis followed by Dr. Jefm Bryant, carotid stenosis followed by Dr. Lucky Cowboy each year, partial colectomy due to diverticulitis in 2013 (refuses colonoscopy again) and had an abdominal hysterectomy due to endometriosis in 1971. Refuses tetanus booster up date. Medicare screening accomplished as above.  2. Peripheral polyneuropathy Unchanged numbness and burning in toes and feet bilaterally as a side effect of using Methotrexate. Discomfort  controlled with Gabapentin 100 mg TID prescribed by her rheumatologist. Using a walker for balance at home when fatigued.  3. COPD (chronic obstructive pulmonary disease) with chronic bronchitis ([place]) Fair control of dyspnea and wheezing with Ventolin-HFA 2 puffs QID prn. Has been smoking 1/2 PPD for 31 years. Encouraged to consider nicotine patch to stop smoking but declines. Pulse oximetry 94% today.  4. Essential hypertension Tolerating HCTZ 50 mg qd and takes an OTC Potassium supplement daily. BP stable with fair control. All lab results from 10-27-16 were normal except triglycerides 209 and VLDL 42. BP Readings from Last 3 Encounters:  01/30/17 140/90  10/27/16 (!) 148/78  07/28/16 122/78   5. Seropositive rheumatoid arthritis ([place]) Presently treated with Methotrexate 15 mg weekly and Enbrel 50 mg injection weekly. Feels joint pains are well maintained with this regimen. Followed routinely by rheumatologist (Dr. Lindon Romp).  6. Mixed hyperlipidemia Tolerating Lipitor 20 mg qd without side effects recognized. Difficult to assess muscle or joint pains with RA.  Lab Results  Component Value Date   CHOL 160 10/27/2016   HDL 47 10/27/2016   LDLCALC 71 10/27/2016   TRIG 209 (H) 10/27/2016   CHOLHDL 3.4 10/27/2016     7. Phobic anxiety disorder Episodes of anxiety and near panic intermittently. Well controlled by using the Xanax 0.5 mg one tablet BID prn. Will recheck q 3 months.    Vernie Murders, PA  [facility]

## 2017-02-06 DIAGNOSIS — R270 Ataxia, unspecified: Secondary | ICD-10-CM | POA: Diagnosis not present

## 2017-02-06 DIAGNOSIS — J42 Unspecified chronic bronchitis: Secondary | ICD-10-CM | POA: Diagnosis not present

## 2017-02-06 DIAGNOSIS — M0579 Rheumatoid arthritis with rheumatoid factor of multiple sites without organ or systems involvement: Secondary | ICD-10-CM | POA: Diagnosis not present

## 2017-02-06 DIAGNOSIS — G629 Polyneuropathy, unspecified: Secondary | ICD-10-CM | POA: Diagnosis not present

## 2017-02-20 ENCOUNTER — Other Ambulatory Visit: Payer: Self-pay

## 2017-02-20 ENCOUNTER — Other Ambulatory Visit: Payer: Self-pay | Admitting: Family Medicine

## 2017-02-20 DIAGNOSIS — I10 Essential (primary) hypertension: Secondary | ICD-10-CM

## 2017-02-20 DIAGNOSIS — E782 Mixed hyperlipidemia: Secondary | ICD-10-CM

## 2017-02-20 DIAGNOSIS — F409 Phobic anxiety disorder, unspecified: Secondary | ICD-10-CM

## 2017-02-20 NOTE — Progress Notes (Signed)
Lab slip printed for pick up on 02/28/2017. Okay per Simona Huh to repeat routine labs.

## 2017-02-28 DIAGNOSIS — H2101 Hyphema, right eye: Secondary | ICD-10-CM | POA: Diagnosis not present

## 2017-02-28 DIAGNOSIS — H43313 Vitreous membranes and strands, bilateral: Secondary | ICD-10-CM | POA: Diagnosis not present

## 2017-02-28 DIAGNOSIS — H04123 Dry eye syndrome of bilateral lacrimal glands: Secondary | ICD-10-CM | POA: Diagnosis not present

## 2017-02-28 DIAGNOSIS — E782 Mixed hyperlipidemia: Secondary | ICD-10-CM | POA: Diagnosis not present

## 2017-03-01 LAB — COMPREHENSIVE METABOLIC PANEL
A/G RATIO: 1.7 (ref 1.2–2.2)
ALBUMIN: 4.8 g/dL (ref 3.5–4.8)
ALT: 21 IU/L (ref 0–32)
AST: 23 IU/L (ref 0–40)
Alkaline Phosphatase: 48 IU/L (ref 39–117)
BUN/Creatinine Ratio: 16 (ref 12–28)
BUN: 12 mg/dL (ref 8–27)
Bilirubin Total: 0.5 mg/dL (ref 0.0–1.2)
CALCIUM: 10.1 mg/dL (ref 8.7–10.3)
CO2: 23 mmol/L (ref 20–29)
Chloride: 94 mmol/L — ABNORMAL LOW (ref 96–106)
Creatinine, Ser: 0.77 mg/dL (ref 0.57–1.00)
GFR, EST AFRICAN AMERICAN: 87 mL/min/{1.73_m2} (ref >59)
GFR, EST NON AFRICAN AMERICAN: 75 mL/min/{1.73_m2} (ref >59)
GLOBULIN, TOTAL: 2.8 g/dL (ref 1.5–4.5)
Glucose: 104 mg/dL — ABNORMAL HIGH (ref 65–99)
POTASSIUM: 4 mmol/L (ref 3.5–5.2)
SODIUM: 137 mmol/L (ref 134–144)
TOTAL PROTEIN: 7.6 g/dL (ref 6.0–8.5)

## 2017-03-01 LAB — CBC WITH DIFFERENTIAL
BASOS ABS: 0.1 10*3/uL (ref 0.0–0.2)
Basos: 1 %
EOS (ABSOLUTE): 0.1 10*3/uL (ref 0.0–0.4)
Eos: 2 %
Hematocrit: 45.2 % (ref 34.0–46.6)
Hemoglobin: 16 g/dL — ABNORMAL HIGH (ref 11.1–15.9)
IMMATURE GRANULOCYTES: 0 %
Immature Grans (Abs): 0 10*3/uL (ref 0.0–0.1)
LYMPHS ABS: 2.2 10*3/uL (ref 0.7–3.1)
Lymphs: 30 %
MCH: 34.2 pg — AB (ref 26.6–33.0)
MCHC: 35.4 g/dL (ref 31.5–35.7)
MCV: 97 fL (ref 79–97)
MONOCYTES: 14 %
MONOS ABS: 1 10*3/uL — AB (ref 0.1–0.9)
NEUTROS ABS: 3.9 10*3/uL (ref 1.4–7.0)
NEUTROS PCT: 53 %
RBC: 4.68 x10E6/uL (ref 3.77–5.28)
RDW: 14.6 % (ref 12.3–15.4)
WBC: 7.4 10*3/uL (ref 3.4–10.8)

## 2017-03-01 LAB — LIPID PANEL
CHOL/HDL RATIO: 3.4 ratio (ref 0.0–4.4)
Cholesterol, Total: 155 mg/dL (ref 100–199)
HDL: 46 mg/dL (ref >39)
LDL CALC: 70 mg/dL (ref 0–99)
TRIGLYCERIDES: 194 mg/dL — AB (ref 0–149)
VLDL CHOLESTEROL CAL: 39 mg/dL (ref 5–40)

## 2017-03-01 LAB — TSH: TSH: 1.21 u[IU]/mL (ref 0.450–4.500)

## 2017-03-02 ENCOUNTER — Telehealth: Payer: Self-pay

## 2017-03-02 NOTE — Telephone Encounter (Signed)
-----   Message from [name], [place] sent at 03/02/2017  7:48 AM EDT ----- Blood tests essentially normal with improvement in triglycerides (almost normal). Still have slight elevation of hemoglobin due to smoking. Recheck appointment in 3-4 months.

## 2017-03-02 NOTE — Telephone Encounter (Signed)
Patient advised. Follow up appointment scheduled.

## 2017-03-21 ENCOUNTER — Other Ambulatory Visit: Payer: Self-pay | Admitting: Family Medicine

## 2017-03-21 DIAGNOSIS — F409 Phobic anxiety disorder, unspecified: Secondary | ICD-10-CM

## 2017-03-21 NOTE — Telephone Encounter (Signed)
Chart indicates this was "phoned in" on 02-20-17 with an additional refill. Was this actually done? If not give pharmacy that prescription now.

## 2017-03-21 NOTE — Telephone Encounter (Signed)
LOV 01/30/2017. Last refill 02/20/2017.

## 2017-03-21 NOTE — Telephone Encounter (Signed)
Per Gerald Stabs at [facility], this medication was not called in. Called in as written in chart.

## 2017-04-13 ENCOUNTER — Other Ambulatory Visit (INDEPENDENT_AMBULATORY_CARE_PROVIDER_SITE_OTHER): Payer: Self-pay | Admitting: Vascular Surgery

## 2017-04-13 DIAGNOSIS — I779 Disorder of arteries and arterioles, unspecified: Secondary | ICD-10-CM

## 2017-04-13 DIAGNOSIS — I739 Peripheral vascular disease, unspecified: Principal | ICD-10-CM

## 2017-04-14 ENCOUNTER — Ambulatory Visit (INDEPENDENT_AMBULATORY_CARE_PROVIDER_SITE_OTHER): Payer: Medicare Other | Admitting: Vascular Surgery

## 2017-04-14 ENCOUNTER — Encounter (INDEPENDENT_AMBULATORY_CARE_PROVIDER_SITE_OTHER): Payer: Self-pay | Admitting: Vascular Surgery

## 2017-04-14 ENCOUNTER — Ambulatory Visit (INDEPENDENT_AMBULATORY_CARE_PROVIDER_SITE_OTHER): Payer: Medicare Other

## 2017-04-14 VITALS — BP 136/77 | HR 90 | Resp 16 | Ht 62.0 in | Wt 150.8 lb

## 2017-04-14 DIAGNOSIS — F17210 Nicotine dependence, cigarettes, uncomplicated: Secondary | ICD-10-CM

## 2017-04-14 DIAGNOSIS — I739 Peripheral vascular disease, unspecified: Principal | ICD-10-CM

## 2017-04-14 DIAGNOSIS — I779 Disorder of arteries and arterioles, unspecified: Secondary | ICD-10-CM | POA: Diagnosis not present

## 2017-04-14 DIAGNOSIS — I6523 Occlusion and stenosis of bilateral carotid arteries: Secondary | ICD-10-CM

## 2017-04-14 DIAGNOSIS — Z720 Tobacco use: Secondary | ICD-10-CM

## 2017-04-14 DIAGNOSIS — E782 Mixed hyperlipidemia: Secondary | ICD-10-CM | POA: Diagnosis not present

## 2017-04-14 DIAGNOSIS — I6529 Occlusion and stenosis of unspecified carotid artery: Secondary | ICD-10-CM | POA: Insufficient documentation

## 2017-04-14 DIAGNOSIS — I10 Essential (primary) hypertension: Secondary | ICD-10-CM | POA: Diagnosis not present

## 2017-04-14 NOTE — Assessment & Plan Note (Signed)
Her carotid duplex today shows increase in the velocities of her right carotid endarterectomy site that would now follow in the moderate range potentially as high as in the 60-79% range although the lower end of that range. Her left carotid artery stenosis remains mild in the 1-39% range. Although this is not severe enough to warrant any intervention, I do believe we should shorten her follow-up interval. We discussed smoking was one of the reasons that she has recurrent stenosis, and she understands this, but she is not likely to stop smoking. I'm going to see her in 6 months with a duplex. She will contact our office if any problems in the interim. Continue aspirin and Lipitor.

## 2017-04-14 NOTE — Assessment & Plan Note (Signed)
lipid control important in reducing the progression of atherosclerotic disease. Continue statin therapy  

## 2017-04-14 NOTE — Patient Instructions (Signed)
Carotid Artery Disease The carotid arteries are arteries on both sides of the neck. They carry blood to the brain. Carotid artery disease is when the arteries get smaller (narrow) or get blocked. If these arteries get smaller or get blocked, you are more likely to have a stroke or warning stroke (transient ischemic attack). Follow these instructions at home:  Take medicines as told by your doctor. Make sure you understand all your medicine instructions. Do not stop your medicines without talking to your doctor first.  Follow your doctor's diet instructions. It is important to eat a healthy diet that includes plenty of: ? Fresh fruits. ? Vegetables. ? Lean meats.  Avoid: ? High-fat foods. ? High-sodium foods. ? Foods that are fried, overly processed, or have poor nutritional value.  Stay a healthy weight.  Stay active. Get at least 30 minutes of activity every day.  Do not smoke.  Limit alcohol use to: ? No more than 2 drinks a day for men. ? No more than 1 drink a day for women who are not pregnant.  Do not use illegal drugs.  Keep all doctor visits as told. Get help right away if:  You have sudden weakness or loss of feeling (numbness) on one side of the body, such as the face, arm, or leg.  You have sudden confusion.  You have trouble speaking (aphasia) or understanding.  You have sudden trouble seeing out of one or both eyes.  You have sudden trouble walking.  You have dizziness or feel like you might pass out (faint).  You have a loss of balance or your movements are not steady (uncoordinated).  You have a sudden, severe headache with no known cause.  You have trouble swallowing (dysphagia). Call your local emergency services (911 in [place] Do notdrive yourself to the clinic or hospital. This information is not intended to replace advice given to you by your health care provider. Make sure you discuss any questions you have with your health care  provider. Document Released: 08/01/2012 Document Revised: 01/21/2016 Document Reviewed: 02/13/2013 Elsevier Interactive Patient Education  2018 [employer]vier Inc.  

## 2017-04-14 NOTE — Assessment & Plan Note (Signed)
blood pressure control important in reducing the progression of atherosclerotic disease. On appropriate oral medications.  

## 2017-04-14 NOTE — Assessment & Plan Note (Signed)
We discussed for several minutes the causative factor of tobacco on the vascular system. The patient really has no interest in stopping smoking. She says she has tried all the pills, patches, gum and has tried to stop cold [place] in the past without success. She then went about 3 months after her colon surgery without smoking but quickly returned to smoking and says that she really has nothing else to look forward to.

## 2017-04-14 NOTE — Progress Notes (Signed)
MRN : [id number]  Donna Mcneil is a 76 y.o. (06-22-1941) female who presents with chief complaint of  Chief Complaint  Patient presents with  . Follow-up    1 yr carotid  .  History of Present Illness: Patient returns in follow-up of her carotid artery disease. She feels well and has no new complaints today. She is undergone surgery for her diverticulitis with a very prolonged recovery several months ago but is back at her baseline. She denies any focal neurologic symptoms. Specifically, the patient denies amaurosis fugax, speech or swallowing difficulties, or arm or leg weakness or numbness. Her carotid duplex today shows increase in the velocities of her right carotid endarterectomy site that would now follow in the moderate range potentially as high as in the 60-79% range although the lower end of that range. Her left carotid artery stenosis remains mild in the 1-39% range.  Current Outpatient Prescriptions  Medication Sig Dispense Refill  . acetaminophen (TYLENOL) 500 MG tablet Take 1,000 mg by mouth every 6 (six) hours as needed.    Marland Kitchen albuterol (PROVENTIL) (2.5 MG/3ML) 0.083% nebulizer solution USE 1 VIAL IN NEBULIZER AS DIRECTED EVERY 4 HOURS AS NEEDED 180 mL 3  . albuterol (VENTOLIN HFA) 108 (90 Base) MCG/ACT inhaler Inhale 2 puffs into the lungs every 6 (six) hours as needed for wheezing or shortness of breath. 8 g 4  . ALPRAZolam (XANAX) 0.5 MG tablet TAKE 1 TABLET BY MOUTH TWICE A DAY AS NEEDED 60 tablet 1  . aspirin 81 MG tablet Take 81 mg by mouth daily.    Marland Kitchen atorvastatin (LIPITOR) 20 MG tablet TAKE 1 TABLET BY MOUTH DAILY 30 tablet 12  . BIOTIN PO Take by mouth.    . Calcium Carbonate-Vitamin D (CALCIUM 600+D) 600-200 MG-UNIT TABS Take 1 tablet by mouth 2 (two) times daily.     . Carboxymethylcellul-Glycerin (OPTIVE) 0.5-0.9 % SOLN Apply to eye.    . DiphenhydrAMINE HCl (BENADRYL ALLERGY PO) Take by mouth as needed.    . docusate sodium (COLACE) 100 MG capsule Take 100  mg by mouth 2 (two) times daily.    Scarlette Shorts SURECLICK 50 MG/ML injection 50 mg once a week.     . folic acid (FOLVITE) 818 MCG tablet Take 400 mcg by mouth daily.    Marland Kitchen gabapentin (NEURONTIN) 100 MG capsule Take 100 mg by mouth 3 (three) times daily.     . hydrochlorothiazide (HYDRODIURIL) 50 MG tablet TAKE 1 TABLET BY MOUTH DAILY 30 tablet 12  . KRILL OIL PO Take by mouth.    . methotrexate (RHEUMATREX) 2.5 MG tablet Take 15 mg by mouth once a week.     . Multiple Vitamins-Minerals (MULTIVITAMIN ADULT PO) Take by mouth daily.     . Omega 3 1000 MG CAPS Take 3 capsules by mouth daily.    . Potassium 99 MG TABS Take by mouth daily.    . ranitidine (ZANTAC) 150 MG tablet Take 150 mg by mouth 2 (two) times daily.    . Vitamin D, Cholecalciferol, 1000 units TABS Take by mouth daily.      No current facility-administered medications for this visit.     Past Medical History:  Diagnosis Date  . Diverticulitis   . Hypercholesteremia   . Hypertension   . Osteoporosis   . Tobacco use     Past Surgical History:  Procedure Laterality Date  . ABDOMINAL HYSTERECTOMY  12/1969  . APPENDECTOMY  1957  . CATARACT EXTRACTION    .  CHOLECYSTECTOMY  1997  . LAPAROSCOPIC SIGMOID COLECTOMY  2013   secondary to diverticulitis  . spit seed removal    . TONSILLECTOMY  1945    Social History Social History  Substance Use Topics  . Smoking status: Current Every Day Smoker    Packs/day: 0.50    Types: Cigarettes  . Smokeless tobacco: Never Used     Comment: started smoking at age 13  . Alcohol use No    Family History Family History  Problem Relation Age of Onset  . Alcohol abuse Mother   . Cirrhosis Mother   . Heart disease Father   . Hypertension Father   . Gout Father   . Ulcers Father   . Hyperlipidemia Father   . Alcohol abuse Brother   . Heart disease Brother        MI at age 57  . OCD Son   . Prostatitis Paternal Grandfather   . Pneumonia Paternal Grandfather   . Cancer Son      Allergies  Allergen Reactions  . Codeine Other (See Comments)    GI upset  . Cyclosporine Other (See Comments)    Burning eyes  . Other     Other reaction(s): OTHER  . Pregabalin Other (See Comments)    Personality changes-becomes angry  . Zolpidem     Other reaction(s): ITCHING     REVIEW OF SYSTEMS (Negative unless checked)  Constitutional: [] Weight loss  [] Fever  [] Chills Cardiac: [] Chest pain   [] Chest pressure   [] Palpitations   [] Shortness of breath when laying flat   [] Shortness of breath at rest   [x] Shortness of breath with exertion. Vascular:  [] Pain in legs with walking   [] Pain in legs at rest   [] Pain in legs when laying flat   [] Claudication   [] Pain in feet when walking  [] Pain in feet at rest  [] Pain in feet when laying flat   [] History of DVT   [] Phlebitis   [] Swelling in legs   [] Varicose veins   [] Non-healing ulcers Pulmonary:   [] Uses home oxygen   [] Productive cough   [] Hemoptysis   [] Wheeze  [x] COPD   [] Asthma Neurologic:  [] Dizziness  [] Blackouts   [] Seizures   [] History of stroke   [] History of TIA  [] Aphasia   [] Temporary blindness   [] Dysphagia   [] Weakness or numbness in arms   [] Weakness or numbness in legs Musculoskeletal:  [x] Arthritis   [] Joint swelling   [] Joint pain   [] Low back pain Hematologic:  [] Easy bruising  [] Easy bleeding   [] Hypercoagulable state   [] Anemic  [] Hepatitis Gastrointestinal:  [] Blood in stool   [] Vomiting blood  [] Gastroesophageal reflux/heartburn   [] Difficulty swallowing. Genitourinary:  [] Chronic kidney disease   [] Difficult urination  [] Frequent urination  [] Burning with urination   [] Blood in urine Skin:  [] Rashes   [] Ulcers   [] Wounds Psychological:  [x] History of anxiety   []  History of major depression.  Physical Examination  Vitals:   04/14/17 1459  BP: 136/77  Pulse: 90  Resp: 16  Weight: 150 lb 12.8 oz (68.4 kg)  Height: 5\' 2"  (1.575 m)   Body mass index is 27.58 kg/m. Gen:  WD/WN, NAD Head: [place]/AT, No  temporalis wasting. Ear/Nose/Throat: Hearing grossly intact, nares w/o erythema or drainage, trachea midline Eyes: Conjunctiva clear. Sclera non-icteric Neck: Supple.  No JVD. Right carotid bruit present Pulmonary:  Good air movement, equal and clear to auscultation bilaterally.  Cardiac: RRR, normal S1, S2, no Murmurs, rubs or gallops. Vascular:  Vessel Right Left  Radial Palpable Palpable                                    Musculoskeletal: M/S 5/5 throughout.  No deformity or atrophy.  Neurologic: CN 2-12 intact. Sensation grossly intact in extremities.  Symmetrical.  Speech is fluent. Motor exam as listed above. Psychiatric: Judgment intact, Mood & affect appropriate for pt's clinical situation. Dermatologic: No rashes or ulcers noted.  No cellulitis or open wounds.      CBC Lab Results  Component Value Date   WBC 7.4 02/28/2017   HGB 16.0 (H) 02/28/2017   HCT 45.2 02/28/2017   MCV 97 02/28/2017   PLT 227 10/27/2016    BMET    Component Value Date/Time   NA 137 02/28/2017 0953   NA 134 (L) 04/23/2012 0640   K 4.0 02/28/2017 0953   K 2.8 (L) 04/23/2012 0640   CL 94 (L) 02/28/2017 0953   CL 91 (L) 04/23/2012 0640   CO2 23 02/28/2017 0953   CO2 32 04/23/2012 0640   GLUCOSE 104 (H) 02/28/2017 0953   GLUCOSE 117 (H) 04/23/2012 0640   BUN 12 02/28/2017 0953   BUN 13 04/23/2012 0640   CREATININE 0.77 02/28/2017 0953   CREATININE 0.54 (L) 04/23/2012 0640   CALCIUM 10.1 02/28/2017 0953   CALCIUM 8.3 (L) 04/23/2012 0640   GFRNONAA 75 02/28/2017 0953   GFRNONAA >60 04/23/2012 0640   GFRAA 87 02/28/2017 0953   GFRAA >60 04/23/2012 0640   CrCl cannot be calculated (Patient's most recent lab result is older than the maximum 21 days allowed.).  COAG Lab Results  Component Value Date   INR 0.9 02/18/2012    Radiology No results found.    Assessment/Plan Hypertension blood pressure control important in reducing the progression of atherosclerotic disease.  On appropriate oral medications.   Tobacco use We discussed for several minutes the causative factor of tobacco on the vascular system. The patient really has no interest in stopping smoking. She says she has tried all the pills, patches, gum and has tried to stop cold [place] in the past without success. She then went about 3 months after her colon surgery without smoking but quickly returned to smoking and says that she really has nothing else to look forward to.  Hyperlipidemia lipid control important in reducing the progression of atherosclerotic disease. Continue statin therapy   Carotid stenosis Her carotid duplex today shows increase in the velocities of her right carotid endarterectomy site that would now follow in the moderate range potentially as high as in the 60-79% range although the lower end of that range. Her left carotid artery stenosis remains mild in the 1-39% range. Although this is not severe enough to warrant any intervention, I do believe we should shorten her follow-up interval. We discussed smoking was one of the reasons that she has recurrent stenosis, and she understands this, but she is not likely to stop smoking. I'm going to see her in 6 months with a duplex. She will contact our office if any problems in the interim. Continue aspirin and Lipitor.    Leotis Pain, MD  04/14/2017 4:24 PM    This note was created with Dragon medical transcription system.  Any errors from dictation are purely unintentional

## 2017-05-19 ENCOUNTER — Other Ambulatory Visit: Payer: Self-pay | Admitting: Family Medicine

## 2017-05-19 DIAGNOSIS — F409 Phobic anxiety disorder, unspecified: Secondary | ICD-10-CM

## 2017-05-19 NOTE — Telephone Encounter (Signed)
RX called in at [facility]

## 2017-05-19 NOTE — Telephone Encounter (Signed)
Phone in refill for the Alprazolam with 1 refill and remind patient to keep appointment on 07-04-17.

## 2017-06-19 ENCOUNTER — Other Ambulatory Visit: Payer: Self-pay | Admitting: Family Medicine

## 2017-06-19 DIAGNOSIS — F409 Phobic anxiety disorder, unspecified: Secondary | ICD-10-CM

## 2017-06-19 MED ORDER — ALBUTEROL SULFATE (2.5 MG/3ML) 0.083% IN NEBU: INHALATION_SOLUTION | RESPIRATORY_TRACT | 3 refills | Status: DC

## 2017-06-19 NOTE — Telephone Encounter (Signed)
Check with pharmacy to see if they received the additional refill on the 05-19-17 refill prescription. It should be available.

## 2017-06-20 NOTE — Telephone Encounter (Signed)
[place] per pharmacist they filled last refill yesterday and request is for future refills. The request is automatic when patient receives last refill. They will send another request when refill is due.

## 2017-07-04 ENCOUNTER — Ambulatory Visit (INDEPENDENT_AMBULATORY_CARE_PROVIDER_SITE_OTHER): Payer: Medicare Other | Admitting: Family Medicine

## 2017-07-04 ENCOUNTER — Encounter: Payer: Self-pay | Admitting: Family Medicine

## 2017-07-04 VITALS — BP 132/82 | HR 95 | Temp 97.8°F | Resp 18 | Wt 154.2 lb

## 2017-07-04 DIAGNOSIS — J449 Chronic obstructive pulmonary disease, unspecified: Secondary | ICD-10-CM | POA: Diagnosis not present

## 2017-07-04 DIAGNOSIS — F409 Phobic anxiety disorder, unspecified: Secondary | ICD-10-CM

## 2017-07-04 DIAGNOSIS — I10 Essential (primary) hypertension: Secondary | ICD-10-CM

## 2017-07-04 DIAGNOSIS — M059 Rheumatoid arthritis with rheumatoid factor, unspecified: Secondary | ICD-10-CM

## 2017-07-04 DIAGNOSIS — E782 Mixed hyperlipidemia: Secondary | ICD-10-CM

## 2017-07-04 DIAGNOSIS — J4489 Other specified chronic obstructive pulmonary disease: Secondary | ICD-10-CM

## 2017-07-04 DIAGNOSIS — Z23 Encounter for immunization: Secondary | ICD-10-CM

## 2017-07-04 DIAGNOSIS — L409 Psoriasis, unspecified: Secondary | ICD-10-CM | POA: Diagnosis not present

## 2017-07-04 LAB — COMPLETE METABOLIC PANEL WITH GFR
AG RATIO: 1.4 (calc) (ref 1.0–2.5)
ALKALINE PHOSPHATASE (APISO): 47 U/L (ref 33–130)
ALT: 18 U/L (ref 6–29)
AST: 19 U/L (ref 10–35)
Albumin: 4.6 g/dL (ref 3.6–5.1)
BILIRUBIN TOTAL: 0.7 mg/dL (ref 0.2–1.2)
BUN: 13 mg/dL (ref 7–25)
CHLORIDE: 99 mmol/L (ref 98–110)
CO2: 26 mmol/L (ref 20–32)
Calcium: 10.1 mg/dL (ref 8.6–10.4)
Creat: 0.72 mg/dL (ref 0.60–0.93)
GFR, EST AFRICAN AMERICAN: 94 mL/min/{1.73_m2} (ref >=60)
GFR, Est Non African American: 81 mL/min/{1.73_m2} (ref >=60)
GLOBULIN: 3.3 g/dL (ref 1.9–3.7)
Glucose, Bld: 97 mg/dL (ref 65–99)
Potassium: 3.9 mmol/L (ref 3.5–5.3)
SODIUM: 138 mmol/L (ref 135–146)
TOTAL PROTEIN: 7.9 g/dL (ref 6.1–8.1)

## 2017-07-04 LAB — TSH: TSH: 0.89 mIU/L (ref 0.40–4.50)

## 2017-07-04 LAB — CBC WITH DIFFERENTIAL/PLATELET
BASOS PCT: 1 %
Basophils Absolute: 73 cells/uL (ref 0–200)
EOS PCT: 1.8 %
Eosinophils Absolute: 131 cells/uL (ref 15–500)
HCT: 46.5 % — ABNORMAL HIGH (ref 35.0–45.0)
HEMOGLOBIN: 16.2 g/dL — AB (ref 11.7–15.5)
Lymphs Abs: 1832 cells/uL (ref 850–3900)
MCH: 32.9 pg (ref 27.0–33.0)
MCHC: 34.8 g/dL (ref 32.0–36.0)
MCV: 94.3 fL (ref 80.0–100.0)
MONOS PCT: 12.8 %
MPV: 10.9 fL (ref 7.5–12.5)
NEUTROS ABS: 4329 {cells}/uL (ref 1500–7800)
Neutrophils Relative %: 59.3 %
PLATELETS: 230 10*3/uL (ref 140–400)
RBC: 4.93 10*6/uL (ref 3.80–5.10)
RDW: 13.3 % (ref 11.0–15.0)
TOTAL LYMPHOCYTE: 25.1 %
WBC mixed population: 934 cells/uL (ref 200–950)
WBC: 7.3 10*3/uL (ref 3.8–10.8)

## 2017-07-04 LAB — LIPID PANEL
CHOL/HDL RATIO: 3.2 (calc) (ref <5.0)
CHOLESTEROL: 153 mg/dL (ref <200)
HDL: 48 mg/dL — AB (ref >50)
LDL CHOLESTEROL (CALC): 72 mg/dL
Non-HDL Cholesterol (Calc): 105 mg/dL (calc) (ref <130)
TRIGLYCERIDES: 235 mg/dL — AB (ref <150)

## 2017-07-04 NOTE — Patient Instructions (Signed)
Psoriasis Psoriasis is a long-term (chronic) condition of skin inflammation. It occurs because your immune system causes skin cells to form too quickly. As a result, too many skin cells grow and create raised, red patches (plaques) that look silvery on your skin. Plaques may appear anywhere on your body. They can be any size or shape. Psoriasis can come and go. The condition varies from mild to very severe. It cannot be passed from one person to another (not contagious). What are the causes? The cause of psoriasis is not known, but certain factors can make the condition worse. These include:  Damage or trauma to the skin, such as cuts, scrapes, sunburn, and dryness.  Lack of sunlight.  Certain medicines.  Alcohol.  Tobacco use.  Stress.  Infections caused by bacteria or viruses.  What increases the risk? This condition is more likely to develop in:  People with a family history of psoriasis.  People who are Caucasian.  People who are between the ages of 15-76 and 78-33 years old.  What are the signs or symptoms? There are five different types of psoriasis. You can have more than one type of psoriasis during your life. Types are:  Plaque.  Guttate.  Inverse.  Pustular.  Erythrodermic.  Each type of psoriasis has different symptoms.  Plaque psoriasis symptoms include red, raised plaques with a silvery white coating (scale). These plaques may be itchy. Your nails may be pitted and crumbly or fall off.  Guttate psoriasis symptoms include small red spots that often show up on your trunk, arms, and legs. These spots may develop after you have been sick, especially with strep throat.  Inverse psoriasis symptoms include plaques in your underarm area, under your breasts, or on your genitals, groin, or buttocks.  Pustular psoriasis symptoms include pus-filled bumps that are painful, red, and swollen on the palms of your hands or the soles of your feet. You also may feel  exhausted, feverish, weak, or have no appetite.  Erythrodermic psoriasis symptoms include bright red skin that may look burned. You may have a fast heartbeat and a body temperature that is too high or too low. You may be itchy or in pain.  How is this diagnosed? Your health care provider may suspect psoriasis based on your symptoms and family history. Your health care provider will also do a physical exam. This may include a procedure to remove a tissue sample (biopsy) for testing. You may also be referred to a health care provider who specializes in skin diseases (dermatologist). How is this treated? There is no cure for this condition, but treatment can help manage it. Goals of treatment include:  Helping your skin heal.  Reducing itching and inflammation.  Slowing the growth of new skin cells.  Helping your immune system respond better to your skin.  Treatment varies, depending on the severity of your condition. Treatment may include:  Creams or ointments.  Ultraviolet ray exposure (light therapy). This may include natural sunlight or light therapy in a medical office.  Medicines (systemic therapy). These medicines can help your body better manage skin cell turnover and inflammation. They may be used along with light therapy or ointments. You may also get antibiotic medicines if you have an infection.  Follow these instructions at home: [place] your skin as needed. Only use moisturizers that have been approved by your health care provider.  Apply cool compresses to the affected areas.  Do not scratch your skin. Lifestyle   Do not  use tobacco products. This includes cigarettes, chewing tobacco, and e-cigarettes. If you need help quitting, ask your health care provider.  Drink little or no alcohol.  Try techniques for stress reduction, such as meditation or yoga.  Get exposure to the sun as told by your health care provider. Do not get sunburned.  Consider  joining a psoriasis support group. Medicines  Take or use over-the-counter and prescription medicines only as told by your health care provider.  If you were prescribed an antibiotic, take or use it as told by your health care provider. Do not stop taking the antibiotic even if your condition starts to improve. General instructions  Keep a journal to help track what triggers an outbreak. Try to avoid any triggers.  See a counselor or social worker if feelings of sadness, frustration, and hopelessness about your condition are interfering with your work and relationships.  Keep all follow-up visits as told by your health care provider. This is important. Contact a health care provider if:  Your pain gets worse.  You have increasing redness or warmth in the affected areas.  You have new or worsening pain or stiffness in your joints.  Your nails start to break easily or pull away from the nail bed.  You have a fever.  You feel depressed. This information is not intended to replace advice given to you by your health care provider. Make sure you discuss any questions you have with your health care provider. Document Released: 08/12/2000 Document Revised: 01/21/2016 Document Reviewed: 12/31/2014 Elsevier Interactive Patient Education  2018 [employer].

## 2017-07-04 NOTE — Progress Notes (Signed)
Patient: Donna Mcneil Female    DOB: 09-08-1940   76 y.o.   MRN: [id number] Visit Date: 07/04/2017  Today's Provider: Vernie Murders, PA   Chief Complaint  Patient presents with  . Hypertension  . Hyperlipidemia  . COPD  . Follow-up   Subjective:      Hypertension, follow-up:  BP Readings from Last 3 Encounters:  07/04/17 132/82  04/14/17 136/77  01/30/17 140/90   She was last seen for hypertension 3 months ago.  BP at that visit was 136/77. Management since that visit includes recommended to restrict sodium intake, stop smoking, and continue medications.  She reports fair compliance with treatment. She is not having side effects.  She is not exercising. She is adherent to low salt diet.   She is experiencing none.  Patient denies chest pain, irregular heart beat and palpitations.   Cardiovascular risk factors include advanced age (older than 57 for men, 36 for women), dyslipidemia and smoking/ tobacco exposure.  Use of agents associated with hypertension: none.   ------------------------------------------------------------------------   Lipid/Cholesterol, Follow-up:   Last seen for this 3 months ago. Labs showed elevated Triglycerides and elevated                hemoglobin. Management since that visit includes recommended to restrict sodium intake, stop smoking, and continue medications.   Last Lipid Panel: Labs(Brief)   Lab Results  Component Value Date   CHOL 155 02/28/2017   HDL 46 02/28/2017   LDLCALC 70 02/28/2017   TRIG 194 (H) 02/28/2017   CHOLHDL 3.4 02/28/2017   She reports fair compliance with treatment. She is not having side effects.   Wt Readings from Last 3 Encounters:  07/04/17 154 lb 3.2 oz (69.9 kg)  04/14/17 150 lb 12.8 oz (68.4 kg)  01/30/17 156 lb (70.8 kg)   ------------------------------------------------------------------------ COPD Follow Up:   3 month follow up. recommended to restrict sodium intake,  stop smoking, and continue medications. Some increase in sputum with sinus drainage this week. Using Albuterol by nebulizer at home 1-2 times a day on average. Some time MDI of Albuterol helpful. Still smoking 5 cigarettes a day.  Past Medical History:  Diagnosis Date  . Diverticulitis   . Hypercholesteremia   . Hypertension   . Osteoporosis   . Tobacco use    Past Surgical History:  Procedure Laterality Date  . ABDOMINAL HYSTERECTOMY  12/1969  . APPENDECTOMY  1957  . CATARACT EXTRACTION    . CHOLECYSTECTOMY  1997  . LAPAROSCOPIC SIGMOID COLECTOMY  2013   secondary to diverticulitis  . spit seed removal    . TONSILLECTOMY  1945   Family History  Problem Relation Age of Onset  . Alcohol abuse Mother   . Cirrhosis Mother   . Heart disease Father   . Hypertension Father   . Gout Father   . Ulcers Father   . Hyperlipidemia Father   . Alcohol abuse Brother   . Heart disease Brother        MI at age 90  . OCD Son   . Prostatitis Paternal Grandfather   . Pneumonia Paternal Grandfather   . Cancer Son    Allergies  Allergen Reactions  . Codeine Other (See Comments)    GI upset  . Cyclosporine Other (See Comments)    Burning eyes  . Other     Other reaction(s): OTHER  . Pregabalin Other (See Comments)    Personality changes-becomes angry  .  Zolpidem     Other reaction(s): ITCHING    Current Outpatient Medications:  .  acetaminophen (TYLENOL) 500 MG tablet, Take 1,000 mg by mouth every 6 (six) hours as needed., Disp: , Rfl:  .  albuterol (PROVENTIL) (2.5 MG/3ML) 0.083% nebulizer solution, USE 1 VIAL IN NEBULIZER AS DIRECTED EVERY 4 HOURS AS NEEDED, Disp: 180 mL, Rfl: 3 .  albuterol (VENTOLIN HFA) 108 (90 Base) MCG/ACT inhaler, Inhale 2 puffs into the lungs every 6 (six) hours as needed for wheezing or shortness of breath., Disp: 8 g, Rfl: 4 .  ALPRAZolam (XANAX) 0.5 MG tablet, TAKE 1 TABLET BY MOUTH TWICE A DAY AS NEEDED, Disp: 60 tablet, Rfl: 1 .  aspirin 81 MG tablet,  Take 81 mg by mouth daily., Disp: , Rfl:  .  atorvastatin (LIPITOR) 20 MG tablet, TAKE 1 TABLET BY MOUTH DAILY, Disp: 30 tablet, Rfl: 12 .  BIOTIN PO, Take by mouth., Disp: , Rfl:  .  Calcium Carbonate-Vitamin D (CALCIUM 600+D) 600-200 MG-UNIT TABS, Take 1 tablet by mouth 2 (two) times daily. , Disp: , Rfl:  .  Carboxymethylcellul-Glycerin (OPTIVE) 0.5-0.9 % SOLN, Apply to eye., Disp: , Rfl:  .  DiphenhydrAMINE HCl (BENADRYL ALLERGY PO), Take by mouth as needed., Disp: , Rfl:  .  docusate sodium (COLACE) 100 MG capsule, Take 100 mg by mouth 2 (two) times daily., Disp: , Rfl:  .  ENBREL SURECLICK 50 MG/ML injection, 50 mg once a week. , Disp: , Rfl:  .  folic acid (FOLVITE) 258 MCG tablet, Take 400 mcg by mouth daily., Disp: , Rfl:  .  gabapentin (NEURONTIN) 100 MG capsule, Take 100 mg by mouth 3 (three) times daily. , Disp: , Rfl:  .  hydrochlorothiazide (HYDRODIURIL) 50 MG tablet, TAKE 1 TABLET BY MOUTH DAILY, Disp: 30 tablet, Rfl: 12 .  KRILL OIL PO, Take by mouth., Disp: , Rfl:  .  methotrexate (RHEUMATREX) 2.5 MG tablet, Take 15 mg by mouth once a week. , Disp: , Rfl:  .  Multiple Vitamins-Minerals (MULTIVITAMIN ADULT PO), Take by mouth daily. , Disp: , Rfl:  .  Omega 3 1000 MG CAPS, Take 3 capsules by mouth daily., Disp: , Rfl:  .  Potassium 99 MG TABS, Take by mouth daily., Disp: , Rfl:  .  ranitidine (ZANTAC) 150 MG tablet, Take 150 mg by mouth 2 (two) times daily., Disp: , Rfl:  .  Vitamin D, Cholecalciferol, 1000 units TABS, Take by mouth daily. , Disp: , Rfl:   Review of Systems  Constitutional: Negative.   Respiratory: Negative.   Cardiovascular: Negative.   Skin: Positive for rash.   Social History   Tobacco Use  . Smoking status: Current Every Day Smoker    Packs/day: 0.50    Types: Cigarettes  . Smokeless tobacco: Never Used  . Tobacco comment: started smoking at age 22  Substance Use Topics  . Alcohol use: No    Alcohol/week: 0.0 oz   Objective:   BP 132/82 (BP  Location: Right Arm, Patient Position: Sitting, Cuff Size: Normal)   Pulse 95   Temp 97.8 F (36.6 C) (Oral)   Resp 18   Wt 154 lb 3.2 oz (69.9 kg)   SpO2 96%   BMI 28.20 kg/m    Physical Exam  Constitutional: She is oriented to person, place, and time. She appears well-developed.  HENT:  Head: Normocephalic.  Right Ear: External ear normal.  Left Ear: External ear normal.  Nose: Nose normal.  Mouth/Throat: Oropharynx  is clear and moist.  Eyes: Conjunctivae are normal.  Neck: Neck supple.  Well healed scar from endarterectomy of the right carotid.  Cardiovascular: Normal rate and regular rhythm.  Pulmonary/Chest: Effort normal and breath sounds normal. She has no wheezes. She has no rales.  Abdominal: Soft. Bowel sounds are normal.  Musculoskeletal:  Crepitus in left knee behind patella. Fair ROM. History of RA seropositive.  Lymphadenopathy:    She has no cervical adenopathy.  Neurological: She is alert and oriented to person, place, and time.  Skin: Rash noted.  Thick silvery nummular rash on upper back and both elbows. Each lesion and an erythematous border.      Assessment & Plan:     1. Essential hypertension Well controlled on the HCTZ 50 mg qd and restriction of sodium in diet. Will recheck labs. Still taking Potassium 99 mg (OTC) qd with this diuretic. Denies chest pains or palpitations.  - CBC with Differential/Platelet - Lipid panel - TSH - COMPLETE METABOLIC PANEL WITH GFR  2. Mixed hyperlipidemia Tolerating Omega-3 and Atorvastatin daily. Trying to restrict fats in diet. Will recheck lipids, TSH and CMP. Follow up pending reports. - Lipid panel - TSH - COMPLETE METABOLIC PANEL WITH GFR  3. COPD (chronic obstructive pulmonary disease) with chronic bronchitis (HCC) Continues to control wheezing and dyspnea with Albuterol by nebulizer or MDI (when away from home). Has sputum production each morning. Pulse oximetry 96% today. Activities limited by arthritis  and COPD. Counseled regarding need to stop all smoking (still smoke 5 cigarettes daily). - CBC with Differential/Platelet  4. Seropositive rheumatoid arthritis (HCC) Still on Methotrexate and Embrel prescribed by Dr. Jefm Bryant (rheumatologist). Will check CBC and CMP. Next follow up visit with Dr. Jefm Bryant will be in a month. - CBC with Differential/Platelet - COMPLETE METABOLIC PANEL WITH GFR  5. Phobic anxiety disorder Stable and controlled with use of Xanax prn. No significant flare of anxiety or panic recently.  6. Psoriasis Many nummular lesions on upper back and faint lesion on both elbows. Recommend she show this to her rheumatologist, also. May need dermatology referral. - CBC with Differential/Platelet  7. Need for influenza vaccination - Flu vaccine HIGH DOSE PF  8. Need for vaccination against Streptococcus pneumoniae using pneumococcal conjugate vaccine 13 - Pneumococcal conjugate vaccine 13-valent IM       Vernie Murders, PA  [facility]

## 2017-07-17 ENCOUNTER — Other Ambulatory Visit: Payer: Self-pay | Admitting: Family Medicine

## 2017-07-17 DIAGNOSIS — F409 Phobic anxiety disorder, unspecified: Secondary | ICD-10-CM

## 2017-07-17 NOTE — Telephone Encounter (Signed)
RX called in at [place]

## 2017-07-17 NOTE — Telephone Encounter (Signed)
Please phone in refill as listed in meds.

## 2017-08-15 DIAGNOSIS — L409 Psoriasis, unspecified: Secondary | ICD-10-CM | POA: Diagnosis not present

## 2017-08-15 DIAGNOSIS — G250 Essential tremor: Secondary | ICD-10-CM | POA: Diagnosis not present

## 2017-08-15 DIAGNOSIS — R270 Ataxia, unspecified: Secondary | ICD-10-CM | POA: Diagnosis not present

## 2017-08-15 DIAGNOSIS — M0579 Rheumatoid arthritis with rheumatoid factor of multiple sites without organ or systems involvement: Secondary | ICD-10-CM | POA: Diagnosis not present

## 2017-08-16 ENCOUNTER — Other Ambulatory Visit: Payer: Self-pay | Admitting: Family Medicine

## 2017-09-12 DIAGNOSIS — H04123 Dry eye syndrome of bilateral lacrimal glands: Secondary | ICD-10-CM | POA: Diagnosis not present

## 2017-09-14 ENCOUNTER — Other Ambulatory Visit: Payer: Self-pay | Admitting: Family Medicine

## 2017-09-14 DIAGNOSIS — F409 Phobic anxiety disorder, unspecified: Secondary | ICD-10-CM

## 2017-09-14 NOTE — Telephone Encounter (Signed)
Phone in refill, please.

## 2017-09-14 NOTE — Telephone Encounter (Signed)
RX called in at [place]

## 2017-09-19 DIAGNOSIS — L930 Discoid lupus erythematosus: Secondary | ICD-10-CM | POA: Diagnosis not present

## 2017-09-19 DIAGNOSIS — R21 Rash and other nonspecific skin eruption: Secondary | ICD-10-CM | POA: Diagnosis not present

## 2017-09-19 DIAGNOSIS — L299 Pruritus, unspecified: Secondary | ICD-10-CM | POA: Diagnosis not present

## 2017-09-27 DIAGNOSIS — R21 Rash and other nonspecific skin eruption: Secondary | ICD-10-CM | POA: Diagnosis not present

## 2017-09-27 DIAGNOSIS — L930 Discoid lupus erythematosus: Secondary | ICD-10-CM | POA: Diagnosis not present

## 2017-10-04 DIAGNOSIS — L932 Other local lupus erythematosus: Secondary | ICD-10-CM | POA: Diagnosis not present

## 2017-10-04 DIAGNOSIS — J439 Emphysema, unspecified: Secondary | ICD-10-CM | POA: Diagnosis not present

## 2017-10-04 DIAGNOSIS — J42 Unspecified chronic bronchitis: Secondary | ICD-10-CM | POA: Diagnosis not present

## 2017-10-04 DIAGNOSIS — M0579 Rheumatoid arthritis with rheumatoid factor of multiple sites without organ or systems involvement: Secondary | ICD-10-CM | POA: Diagnosis not present

## 2017-10-10 ENCOUNTER — Ambulatory Visit (INDEPENDENT_AMBULATORY_CARE_PROVIDER_SITE_OTHER): Payer: Medicare Other | Admitting: Family Medicine

## 2017-10-10 ENCOUNTER — Encounter: Payer: Self-pay | Admitting: Family Medicine

## 2017-10-10 VITALS — BP 142/80 | HR 83 | Temp 97.9°F | Resp 16 | Wt 150.8 lb

## 2017-10-10 DIAGNOSIS — F409 Phobic anxiety disorder, unspecified: Secondary | ICD-10-CM

## 2017-10-10 DIAGNOSIS — M059 Rheumatoid arthritis with rheumatoid factor, unspecified: Secondary | ICD-10-CM | POA: Diagnosis not present

## 2017-10-10 DIAGNOSIS — M329 Systemic lupus erythematosus, unspecified: Secondary | ICD-10-CM | POA: Diagnosis not present

## 2017-10-10 DIAGNOSIS — I10 Essential (primary) hypertension: Secondary | ICD-10-CM

## 2017-10-10 DIAGNOSIS — E782 Mixed hyperlipidemia: Secondary | ICD-10-CM | POA: Diagnosis not present

## 2017-10-10 DIAGNOSIS — G629 Polyneuropathy, unspecified: Secondary | ICD-10-CM | POA: Diagnosis not present

## 2017-10-10 NOTE — Progress Notes (Signed)
Patient: Donna Mcneil Female    DOB: [birth year]   77 y.o.   MRN: [id number] Visit Date: 10/10/2017  Today's Provider: Vernie Murders, PA   Chief Complaint  Patient presents with  . Follow-up    3 months  . Hypertension  . Hyperlipidemia  . Lupus    Pt was sent to her rheumotlogist beacuse she was thought to have psorasis on back, She was then sent to derm and was dx with Lupus.   Subjective:    HPI  Hypertension Fair control of BP. Was 152/70 when checked at rheumatology appointment on 10-04-17. Continues to take HCTZ 50 mg qd without chest pain, dyspnea, edema or muscle cramps. Still smoking about 1ppd - started smoking at age 58.  Hyperlipidemia Lab Results  Component Value Date   CHOL 153 07/04/2017   HDL 48 (L) 07/04/2017   LDLCALC 70 02/28/2017   TRIG 235 (H) 07/04/2017   CHOLHDL 3.2 07/04/2017   Still taking the Atorvastatin 20 mg qd and trying to follow a low fat diet. Rheumatoid arthritis and Lupus limits exercise ability.  Lupus Some rash on her back that was originally felt to be psoriasis by PCP and rheumatologist. Final biopsy report by dermatologist (Dr. Laurence Ferrari) confirmed Lupus. Rheumatologist stopped the Embrel and the dermatologist gave her beclomethasone cream until final staging has been determined by other tests   Past Medical History:  Diagnosis Date  . Diverticulitis   . Hypercholesteremia   . Hypertension   . Lupus   . Osteoporosis   . Tobacco use    Past Surgical History:  Procedure Laterality Date  . ABDOMINAL HYSTERECTOMY  12/1969  . APPENDECTOMY  1957  . CATARACT EXTRACTION    . CHOLECYSTECTOMY  1997  . LAPAROSCOPIC SIGMOID COLECTOMY  2013   secondary to diverticulitis  . spit seed removal    . TONSILLECTOMY  1945   Family History  Problem Relation Age of Onset  . Alcohol abuse Mother   . Cirrhosis Mother   . Heart disease Father   . Hypertension Father   . Gout Father   . Ulcers Father   . Hyperlipidemia Father   .  Alcohol abuse Brother   . Heart disease Brother        MI at age 35  . OCD Son   . Prostatitis Paternal Grandfather   . Pneumonia Paternal Grandfather   . Cancer Son    Allergies  Allergen Reactions  . Codeine Other (See Comments)    GI upset  . Cyclosporine Other (See Comments)    Burning eyes  . Other     Other reaction(s): OTHER  . Pregabalin Other (See Comments)    Personality changes-becomes angry  . Zolpidem     Other reaction(s): ITCHING    Current Outpatient Medications:  .  acetaminophen (TYLENOL) 500 MG tablet, Take 1,000 mg by mouth every 6 (six) hours as needed., Disp: , Rfl:  .  albuterol (PROVENTIL) (2.5 MG/3ML) 0.083% nebulizer solution, USE 1 VIAL IN NEBULIZER AS DIRECTED EVERY 4 HOURS AS NEEDED, Disp: 180 mL, Rfl: 3 .  ALPRAZolam (XANAX) 0.5 MG tablet, TAKE 1 TABLET BY MOUTH TWICE DAILY AS NEEDED., Disp: 60 tablet, Rfl: 0 .  aspirin 81 MG tablet, Take 81 mg by mouth daily., Disp: , Rfl:  .  atorvastatin (LIPITOR) 20 MG tablet, TAKE 1 TABLET BY MOUTH DAILY, Disp: 30 tablet, Rfl: 12 .  betamethasone dipropionate (DIPROLENE) 0.05 % cream, Apply 9.38 application topically  2 (two) times daily., Disp: , Rfl:  .  BIOTIN PO, Take by mouth., Disp: , Rfl:  .  Calcium Carbonate-Vitamin D (CALCIUM 600+D) 600-200 MG-UNIT TABS, Take 1 tablet by mouth 2 (two) times daily. , Disp: , Rfl:  .  clobetasol ointment (TEMOVATE) 0.05 %, Apply 5.88 application topically 2 (two) times daily., Disp: , Rfl:  .  DiphenhydrAMINE HCl (BENADRYL ALLERGY PO), Take by mouth as needed., Disp: , Rfl:  .  docusate sodium (COLACE) 100 MG capsule, Take 100 mg by mouth 2 (two) times daily., Disp: , Rfl:  .  folic acid (FOLVITE) 502 MCG tablet, Take 400 mcg by mouth daily., Disp: , Rfl:  .  gabapentin (NEURONTIN) 100 MG capsule, Take 100 mg by mouth 3 (three) times daily. , Disp: , Rfl:  .  hydrochlorothiazide (HYDRODIURIL) 50 MG tablet, TAKE 1 TABLET BY MOUTH DAILY, Disp: 30 tablet, Rfl: 12 .  KRILL  OIL PO, Take by mouth., Disp: , Rfl:  .  methotrexate (RHEUMATREX) 2.5 MG tablet, Take 20 mg by mouth once a week. , Disp: , Rfl:  .  Multiple Vitamins-Minerals (MULTIVITAMIN ADULT PO), Take by mouth daily. , Disp: , Rfl:  .  Omega 3 1000 MG CAPS, Take 3 capsules by mouth daily., Disp: , Rfl:  .  Potassium 99 MG TABS, Take by mouth daily., Disp: , Rfl:  .  PROAIR HFA 108 (90 Base) MCG/ACT inhaler, INHALE 2 PUFFS EVERY 6 HOURS AS NEEDED, Disp: 8.5 g, Rfl: 3 .  ranitidine (ZANTAC) 150 MG tablet, Take 150 mg by mouth 2 (two) times daily., Disp: , Rfl:  .  Vitamin D, Cholecalciferol, 1000 units TABS, Take by mouth daily. , Disp: , Rfl:  .  Carboxymethylcellul-Glycerin (OPTIVE) 0.5-0.9 % SOLN, Apply to eye., Disp: , Rfl:   Review of Systems  Musculoskeletal: Positive for arthralgias.    Social History   Tobacco Use  . Smoking status: Current Every Day Smoker    Packs/day: 0.50    Types: Cigarettes  . Smokeless tobacco: Never Used  . Tobacco comment: started smoking at age 61  Substance Use Topics  . Alcohol use: No    Alcohol/week: 0.0 oz   Objective:   BP (!) 142/80   Pulse 83   Temp 97.9 F (36.6 C) (Oral)   Resp 16   Wt 150 lb 12.8 oz (68.4 kg)   SpO2 96%   BMI 27.58 kg/m  Vitals:   10/10/17 0907  BP: (!) 142/80  Pulse: 83  Resp: 16  Temp: 97.9 F (36.6 C)  TempSrc: Oral  SpO2: 96%  Weight: 150 lb 12.8 oz (68.4 kg)   Physical Exam  Constitutional: She is oriented to person, place, and time. She appears well-developed and well-nourished. No distress.  HENT:  Head: Normocephalic and atraumatic.  Right Ear: Hearing and external ear normal.  Left Ear: Hearing and external ear normal.  Nose: Nose normal.  Mouth/Throat: Oropharynx is clear and moist.  Eyes: Conjunctivae and lids are normal. Right eye exhibits no discharge. Left eye exhibits no discharge. No scleral icterus.  Neck: Neck supple.  Cardiovascular: Normal rate and regular rhythm.  Pulmonary/Chest: Effort  normal. No respiratory distress.  Distant breath sounds without wheezing or rhonchi present.  Abdominal: Soft. Bowel sounds are normal.  Musculoskeletal: She exhibits no deformity.  Fair ROM. History of RA seropositive.  Lymphadenopathy:    She has no cervical adenopathy.  Neurological: She is alert and oriented to person, place, and time.  Skin:  Skin is intact. Rash noted. No lesion noted.  Less prominent and fading erythematous rash on back with history of lupus.  Psychiatric: She has a normal mood and affect. Her speech is normal and behavior is normal. Thought content normal.      Assessment & Plan:     1. Essential hypertension Stable on HCTZ 50 mg qd. No chest pains, palpitations or significant edema. Limit sodium intake and needs to quit smoking (counseled each visit but doubt she will stop). Recheck labs and follow up pending reports. - CBC with Differential/Platelet - Comprehensive metabolic panel  2. Mixed hyperlipidemia Continues to tolerate Krill Oil and Atorvastatin 20 mg qd. Recheck labs and follow up pending reports. - Comprehensive metabolic panel - Lipid panel - TSH  3. Peripheral polyneuropathy Numbness in both feet for many years. Feels the Gabapentin 100 mg TID helps to control pains in feet. Has to use a walker because she has no sensation in large parts of both feet. Continue follow up with rheumatologist.  4. Seropositive rheumatoid arthritis ([place]) Regularly followed by Dr. Jefm Bryant (rheumatologist) and he requests labs through this office every 3 months. No joint deformities, swelling or redness today. Pain and stiffness makes it necessary for her to use a walker for ambulation outside of the home. Presently on Methotrexate. Will get follow up labs as requested. - CBC with Differential/Platelet - Comprehensive metabolic panel - Sedimentation rate  5. Phobic anxiety disorder Feels controlled by Alprazolam 0.5 mg BID. No significant panic recently. Sleeps  better with the last dose of Alprazolam at bedtime each day. No depression or suicidal ideation. Recheck in 3 months.  6. Systemic lupus erythematosus, unspecified SLE type, unspecified organ involvement status ([place]) Dermatitis biopsy confirmed Lupus by Dr. Laurence Ferrari (dermatologist). Rheumatologist (Dr. Jefm Bryant) stopped her Enbrel and increased methotrexate 2.5 mg to 8 tablets daily. Further tests pending to determine stage and treatment regimen. Recheck labs to assess kidney function, blood cell counts and sedrate. - CBC with Differential/Platelet - Comprehensive metabolic panel - Sedimentation rate       Vernie Murders, PA  [facility]

## 2017-10-11 LAB — CBC WITH DIFFERENTIAL/PLATELET
BASOS: 1 %
Basophils Absolute: 0.1 10*3/uL (ref 0.0–0.2)
EOS (ABSOLUTE): 0.1 10*3/uL (ref 0.0–0.4)
EOS: 2 %
HEMOGLOBIN: 15.9 g/dL (ref 11.1–15.9)
Hematocrit: 45 % (ref 34.0–46.6)
Immature Grans (Abs): 0 10*3/uL (ref 0.0–0.1)
Immature Granulocytes: 1 %
LYMPHS ABS: 1.6 10*3/uL (ref 0.7–3.1)
Lymphs: 20 %
MCH: 34.6 pg — AB (ref 26.6–33.0)
MCHC: 35.3 g/dL (ref 31.5–35.7)
MCV: 98 fL — AB (ref 79–97)
MONOS ABS: 1 10*3/uL — AB (ref 0.1–0.9)
Monocytes: 12 %
NEUTROS ABS: 5.1 10*3/uL (ref 1.4–7.0)
Neutrophils: 64 %
Platelets: 225 10*3/uL (ref 150–379)
RBC: 4.6 x10E6/uL (ref 3.77–5.28)
RDW: 14.4 % (ref 12.3–15.4)
WBC: 7.9 10*3/uL (ref 3.4–10.8)

## 2017-10-11 LAB — COMPREHENSIVE METABOLIC PANEL
A/G RATIO: 1.5 (ref 1.2–2.2)
ALBUMIN: 4.6 g/dL (ref 3.5–4.8)
ALK PHOS: 55 IU/L (ref 39–117)
ALT: 22 IU/L (ref 0–32)
AST: 20 IU/L (ref 0–40)
BUN / CREAT RATIO: 17 (ref 12–28)
BUN: 12 mg/dL (ref 8–27)
Bilirubin Total: 0.5 mg/dL (ref 0.0–1.2)
CO2: 21 mmol/L (ref 20–29)
Calcium: 9.6 mg/dL (ref 8.7–10.3)
Chloride: 98 mmol/L (ref 96–106)
Creatinine, Ser: 0.69 mg/dL (ref 0.57–1.00)
GFR calc Af Amer: 97 mL/min/{1.73_m2} (ref >59)
GFR calc non Af Amer: 84 mL/min/{1.73_m2} (ref >59)
GLOBULIN, TOTAL: 3.1 g/dL (ref 1.5–4.5)
Glucose: 92 mg/dL (ref 65–99)
POTASSIUM: 3.9 mmol/L (ref 3.5–5.2)
SODIUM: 137 mmol/L (ref 134–144)
Total Protein: 7.7 g/dL (ref 6.0–8.5)

## 2017-10-11 LAB — LIPID PANEL
CHOL/HDL RATIO: 2.8 ratio (ref 0.0–4.4)
CHOLESTEROL TOTAL: 143 mg/dL (ref 100–199)
HDL: 51 mg/dL (ref >39)
LDL Calculated: 59 mg/dL (ref 0–99)
TRIGLYCERIDES: 164 mg/dL — AB (ref 0–149)
VLDL Cholesterol Cal: 33 mg/dL (ref 5–40)

## 2017-10-11 LAB — TSH: TSH: 0.691 u[IU]/mL (ref 0.450–4.500)

## 2017-10-11 LAB — SEDIMENTATION RATE: SED RATE: 71 mm/h — AB (ref 0–40)

## 2017-10-12 ENCOUNTER — Telehealth: Payer: Self-pay

## 2017-10-12 NOTE — Telephone Encounter (Signed)
-----   Message from [name], [place] sent at 10/12/2017 10:19 AM EST ----- Blood tests essentially normal except triglycerides a little above the goal of <150. Sedrate elevated due to rheumatoid arthritis/lupus. Be sure this report is faxed to Dr. Lindon Romp (rheumatologist). Recheck in 3 months.

## 2017-10-12 NOTE — Telephone Encounter (Signed)
Patient advised. 3 month follow up scheduled. Lab results faxed to Dr. Jefm Bryant.

## 2017-10-13 ENCOUNTER — Encounter (INDEPENDENT_AMBULATORY_CARE_PROVIDER_SITE_OTHER): Payer: Self-pay

## 2017-10-13 ENCOUNTER — Ambulatory Visit (INDEPENDENT_AMBULATORY_CARE_PROVIDER_SITE_OTHER): Payer: Medicare Other

## 2017-10-13 ENCOUNTER — Ambulatory Visit (INDEPENDENT_AMBULATORY_CARE_PROVIDER_SITE_OTHER): Payer: Medicare Other | Admitting: Vascular Surgery

## 2017-10-17 ENCOUNTER — Telehealth: Payer: Self-pay

## 2017-10-17 NOTE — Telephone Encounter (Signed)
Patient called office because she was referred by out office to vascular surgeon, patient states when she went to appointment on Friday she was told she had to make a co-payment because insurance would not cover? Patient states she called office back and spoke with Judson Roch. C about issue and has not heard back on who she will be seeing that covered under her insurance. KW

## 2017-10-18 NOTE — Telephone Encounter (Signed)
I spoke to Dr Bunnie Domino office and was told that pt's Medicaid is only for mental health.Pt states she has been a pt of Dr Lucky Cowboy for 7 years and has not had to pay a copay.I advised pt to call their office and ask for their office manager.If office manager is unable to help with situation she may need to call Medicaid office to see what type of Medicaid she has

## 2017-10-19 DIAGNOSIS — L930 Discoid lupus erythematosus: Secondary | ICD-10-CM | POA: Diagnosis not present

## 2017-10-20 ENCOUNTER — Other Ambulatory Visit: Payer: Self-pay | Admitting: Family Medicine

## 2017-10-20 DIAGNOSIS — F409 Phobic anxiety disorder, unspecified: Secondary | ICD-10-CM

## 2017-10-24 DIAGNOSIS — L930 Discoid lupus erythematosus: Secondary | ICD-10-CM | POA: Diagnosis not present

## 2017-11-14 DIAGNOSIS — Z79899 Other long term (current) drug therapy: Secondary | ICD-10-CM | POA: Diagnosis not present

## 2017-11-16 DIAGNOSIS — D225 Melanocytic nevi of trunk: Secondary | ICD-10-CM | POA: Diagnosis not present

## 2017-11-16 DIAGNOSIS — D485 Neoplasm of uncertain behavior of skin: Secondary | ICD-10-CM | POA: Diagnosis not present

## 2017-11-16 DIAGNOSIS — L930 Discoid lupus erythematosus: Secondary | ICD-10-CM | POA: Diagnosis not present

## 2017-12-01 ENCOUNTER — Encounter (INDEPENDENT_AMBULATORY_CARE_PROVIDER_SITE_OTHER): Payer: Self-pay | Admitting: Vascular Surgery

## 2017-12-01 ENCOUNTER — Ambulatory Visit (INDEPENDENT_AMBULATORY_CARE_PROVIDER_SITE_OTHER): Payer: Medicare Other | Admitting: Vascular Surgery

## 2017-12-01 ENCOUNTER — Ambulatory Visit (INDEPENDENT_AMBULATORY_CARE_PROVIDER_SITE_OTHER): Payer: Medicare Other

## 2017-12-01 VITALS — BP 170/81 | HR 74 | Resp 17 | Ht 62.0 in | Wt 149.0 lb

## 2017-12-01 DIAGNOSIS — E782 Mixed hyperlipidemia: Secondary | ICD-10-CM

## 2017-12-01 DIAGNOSIS — I6523 Occlusion and stenosis of bilateral carotid arteries: Secondary | ICD-10-CM

## 2017-12-01 DIAGNOSIS — I10 Essential (primary) hypertension: Secondary | ICD-10-CM

## 2017-12-01 NOTE — Assessment & Plan Note (Signed)
Her carotid duplex shows velocities in the 40-59% range on the right and 1-39% range on the left.  This is much more consistent with her studies prior to her last visit where it looks like a right carotid velocities had jumped into the 60-79% range. Continue medical management.  No role for intervention at this level.  Recheck in 1 year.

## 2017-12-01 NOTE — Progress Notes (Signed)
MRN : [id number]  Donna Mcneil is a 77 y.o. (1941-06-30) female who presents with chief complaint of  Chief Complaint  Patient presents with  . Follow-up    6 month Carotid f/u  .  History of Present Illness: Patient returns today in follow-up of her carotid disease.  She had to be taken off of her typical rheumatoid arthritis medication and is now on Plaquenil.  That is not working as well for her. She has not had focal neurologic symptoms. Specifically, the patient denies amaurosis fugax, speech or swallowing difficulties, or arm or leg weakness or numbness.  Her carotid duplex shows velocities in the 40-59% range on the right and 1-39% range on the left.  This is much more consistent with her studies prior to her last visit where it looks like a right carotid velocities had jumped into the 60-79% range.  Current Outpatient Prescriptions  Medication Sig Dispense Refill  . acetaminophen (TYLENOL) 500 MG tablet Take 1,000 mg by mouth every 6 (six) hours as needed.    Marland Kitchen albuterol (PROVENTIL) (2.5 MG/3ML) 0.083% nebulizer solution USE 1 VIAL IN NEBULIZER AS DIRECTED EVERY 4 HOURS AS NEEDED 180 mL 3  . albuterol (VENTOLIN HFA) 108 (90 Base) MCG/ACT inhaler Inhale 2 puffs into the lungs every 6 (six) hours as needed for wheezing or shortness of breath. 8 g 4  . ALPRAZolam (XANAX) 0.5 MG tablet TAKE 1 TABLET BY MOUTH TWICE A DAY AS NEEDED 60 tablet 1  . aspirin 81 MG tablet Take 81 mg by mouth daily.    Marland Kitchen atorvastatin (LIPITOR) 20 MG tablet TAKE 1 TABLET BY MOUTH DAILY 30 tablet 12  . BIOTIN PO Take by mouth.    . Calcium Carbonate-Vitamin D (CALCIUM 600+D) 600-200 MG-UNIT TABS Take 1 tablet by mouth 2 (two) times daily.     . Carboxymethylcellul-Glycerin (OPTIVE) 0.5-0.9 % SOLN Apply to eye.    . DiphenhydrAMINE HCl (BENADRYL ALLERGY PO) Take by mouth as needed.    . docusate sodium (COLACE) 100 MG capsule Take 100 mg by mouth 2 (two) times daily.    Scarlette Shorts SURECLICK  50 MG/ML injection 50 mg once a week.     . folic acid (FOLVITE) 932 MCG tablet Take 400 mcg by mouth daily.    Marland Kitchen gabapentin (NEURONTIN) 100 MG capsule Take 100 mg by mouth 3 (three) times daily.     . hydrochlorothiazide (HYDRODIURIL) 50 MG tablet TAKE 1 TABLET BY MOUTH DAILY 30 tablet 12  . KRILL OIL PO Take by mouth.    . methotrexate (RHEUMATREX) 2.5 MG tablet Take 15 mg by mouth once a week.     . Multiple Vitamins-Minerals (MULTIVITAMIN ADULT PO) Take by mouth daily.     . Omega 3 1000 MG CAPS Take 3 capsules by mouth daily.    . Potassium 99 MG TABS Take by mouth daily.    . ranitidine (ZANTAC) 150 MG tablet Take 150 mg by mouth 2 (two) times daily.    . Vitamin D, Cholecalciferol, 1000 units TABS Take by mouth daily.      No current facility-administered medications for this visit.         Past Medical History:  Diagnosis Date  . Diverticulitis   . Hypercholesteremia   . Hypertension   . Osteoporosis   . Tobacco use          Past Surgical History:  Procedure Laterality Date  . ABDOMINAL HYSTERECTOMY  12/1969  . APPENDECTOMY  1957  . CATARACT EXTRACTION    . CHOLECYSTECTOMY  1997  . LAPAROSCOPIC SIGMOID COLECTOMY  2013   secondary to diverticulitis  . spit seed removal    . TONSILLECTOMY  1945    Social History Social History  Substance Use Topics  . Smoking status: Current Every Day Smoker    Packs/day: 0.50    Types: Cigarettes  . Smokeless tobacco: Never Used     Comment: started smoking at age 5  . Alcohol use No    Family History Family History  Problem Relation Age of Onset  . Alcohol abuse Mother   . Cirrhosis Mother   . Heart disease Father   . Hypertension Father   . Gout Father   . Ulcers Father   . Hyperlipidemia Father   . Alcohol abuse Brother   . Heart disease Brother        MI at age 4  . OCD Son   . Prostatitis Paternal Grandfather   . Pneumonia Paternal Grandfather    . Cancer Son          Allergies  Allergen Reactions  . Codeine Other (See Comments)    GI upset  . Cyclosporine Other (See Comments)    Burning eyes  . Other     Other reaction(s): OTHER  . Pregabalin Other (See Comments)    Personality changes-becomes angry  . Zolpidem     Other reaction(s): ITCHING     REVIEW OF SYSTEMS (Negative unless checked)  Constitutional: [] Weight loss  [] Fever  [] Chills Cardiac: [] Chest pain   [] Chest pressure   [] Palpitations   [] Shortness of breath when laying flat   [] Shortness of breath at rest   [x] Shortness of breath with exertion. Vascular:  [] Pain in legs with walking   [] Pain in legs at rest   [] Pain in legs when laying flat   [] Claudication   [] Pain in feet when walking  [] Pain in feet at rest  [] Pain in feet when laying flat   [] History of DVT   [] Phlebitis   [] Swelling in legs   [] Varicose veins   [] Non-healing ulcers Pulmonary:   [] Uses home oxygen   [] Productive cough   [] Hemoptysis   [] Wheeze  [x] COPD   [] Asthma Neurologic:  [] Dizziness  [] Blackouts   [] Seizures   [] History of stroke   [] History of TIA  [] Aphasia   [] Temporary blindness   [] Dysphagia   [] Weakness or numbness in arms   [] Weakness or numbness in legs Musculoskeletal:  [x] Arthritis   [] Joint swelling   [] Joint pain   [] Low back pain Hematologic:  [] Easy bruising  [] Easy bleeding   [] Hypercoagulable state   [] Anemic  [] Hepatitis Gastrointestinal:  [] Blood in stool   [] Vomiting blood  [] Gastroesophageal reflux/heartburn   [] Difficulty swallowing. Genitourinary:  [] Chronic kidney disease   [] Difficult urination  [] Frequent urination  [] Burning with urination   [] Blood in urine Skin:  [] Rashes   [] Ulcers   [] Wounds Psychological:  [x] History of anxiety   []  History of major depression.     Physical Examination  Vitals:   12/01/17 1549  BP: (!) 170/81  Pulse: 74  Resp: 17  Weight: 67.6 kg (149 lb)  Height: 5\' 2"  (1.575 m)   Body mass index is 27.25  kg/m. Gen:  WD/WN, NAD Head: [place]/AT, No temporalis wasting. Ear/Nose/Throat: Hearing grossly intact, nares w/o erythema or drainage, trachea midline Eyes: Conjunctiva clear. Sclera non-icteric Neck: Supple.  Soft right bruit  Pulmonary:  Good air movement, equal and clear to auscultation  bilaterally.  Cardiac: RRR, No JVD Vascular:  Vessel Right Left  Radial Palpable Palpable                                    Musculoskeletal: M/S 5/5 throughout.  Walking with a walker Neurologic: CN 2-12 intact. Sensation grossly intact in extremities.  Symmetrical.  Speech is fluent. Motor exam as listed above. Psychiatric: Judgment intact, Mood & affect appropriate for pt's clinical situation. Dermatologic: No rashes or ulcers noted.  No cellulitis or open wounds.      CBC Lab Results  Component Value Date   WBC 7.9 10/10/2017   HGB 15.9 10/10/2017   HCT 45.0 10/10/2017   MCV 98 (H) 10/10/2017   PLT 225 10/10/2017    BMET    Component Value Date/Time   NA 137 10/10/2017 1110   NA 134 (L) 04/23/2012 0640   K 3.9 10/10/2017 1110   K 2.8 (L) 04/23/2012 0640   CL 98 10/10/2017 1110   CL 91 (L) 04/23/2012 0640   CO2 21 10/10/2017 1110   CO2 32 04/23/2012 0640   GLUCOSE 92 10/10/2017 1110   GLUCOSE 97 07/04/2017 1045   GLUCOSE 117 (H) 04/23/2012 0640   BUN 12 10/10/2017 1110   BUN 13 04/23/2012 0640   CREATININE 0.69 10/10/2017 1110   CREATININE 0.72 07/04/2017 1045   CALCIUM 9.6 10/10/2017 1110   CALCIUM 8.3 (L) 04/23/2012 0640   GFRNONAA 84 10/10/2017 1110   GFRNONAA 81 07/04/2017 1045   GFRAA 97 10/10/2017 1110   GFRAA 94 07/04/2017 1045   CrCl cannot be calculated (Patient's most recent lab result is older than the maximum 21 days allowed.).  COAG Lab Results  Component Value Date   INR 0.9 02/18/2012    Radiology No results found.    Assessment/Plan Hypertension blood pressure control important in reducing the progression of atherosclerotic disease. On  appropriate oral medications.    Hyperlipidemia lipid control important in reducing the progression of atherosclerotic disease. Continue statin therapy  Carotid stenosis Her carotid duplex shows velocities in the 40-59% range on the right and 1-39% range on the left.  This is much more consistent with her studies prior to her last visit where it looks like a right carotid velocities had jumped into the 60-79% range. Continue medical management.  No role for intervention at this level.  Recheck in 1 year.    Leotis Pain, MD  12/01/2017 5:08 PM    This note was created with Dragon medical transcription system.  Any errors from dictation are purely unintentional

## 2017-12-12 DIAGNOSIS — J42 Unspecified chronic bronchitis: Secondary | ICD-10-CM | POA: Diagnosis not present

## 2017-12-12 DIAGNOSIS — M0579 Rheumatoid arthritis with rheumatoid factor of multiple sites without organ or systems involvement: Secondary | ICD-10-CM | POA: Diagnosis not present

## 2017-12-12 DIAGNOSIS — L932 Other local lupus erythematosus: Secondary | ICD-10-CM | POA: Insufficient documentation

## 2017-12-18 ENCOUNTER — Other Ambulatory Visit: Payer: Self-pay | Admitting: Family Medicine

## 2017-12-18 DIAGNOSIS — F409 Phobic anxiety disorder, unspecified: Secondary | ICD-10-CM

## 2018-01-09 ENCOUNTER — Encounter: Payer: Self-pay | Admitting: Family Medicine

## 2018-01-09 ENCOUNTER — Ambulatory Visit (INDEPENDENT_AMBULATORY_CARE_PROVIDER_SITE_OTHER): Payer: Medicare Other | Admitting: Family Medicine

## 2018-01-09 VITALS — BP 142/78 | HR 94 | Temp 98.0°F | Wt 149.2 lb

## 2018-01-09 DIAGNOSIS — E782 Mixed hyperlipidemia: Secondary | ICD-10-CM | POA: Diagnosis not present

## 2018-01-09 DIAGNOSIS — L932 Other local lupus erythematosus: Secondary | ICD-10-CM | POA: Diagnosis not present

## 2018-01-09 DIAGNOSIS — Z720 Tobacco use: Secondary | ICD-10-CM | POA: Diagnosis not present

## 2018-01-09 DIAGNOSIS — M059 Rheumatoid arthritis with rheumatoid factor, unspecified: Secondary | ICD-10-CM | POA: Diagnosis not present

## 2018-01-09 DIAGNOSIS — I6523 Occlusion and stenosis of bilateral carotid arteries: Secondary | ICD-10-CM | POA: Diagnosis not present

## 2018-01-09 DIAGNOSIS — G629 Polyneuropathy, unspecified: Secondary | ICD-10-CM | POA: Diagnosis not present

## 2018-01-09 DIAGNOSIS — I10 Essential (primary) hypertension: Secondary | ICD-10-CM

## 2018-01-09 DIAGNOSIS — F409 Phobic anxiety disorder, unspecified: Secondary | ICD-10-CM

## 2018-01-09 NOTE — Progress Notes (Signed)
Patient: Donna Mcneil Female    DOB: 21-Dec-1940   77 y.o.   MRN: [id number] Visit Date: 01/09/2018  Today's Provider: Vernie Murders, PA   Chief Complaint  Patient presents with  . Hypertension  . Hyperlipidemia  . Follow-up   Subjective:    HPI  Hypertension, follow-up: BP Readings from Last 3 Encounters:  01/09/18 (!) 142/78  12/01/17 (!) 170/81  10/10/17 (!) 142/80   Shewas last seen for hypertension 15monthsago.  BP at that visit was142/80. Management since that visit includesrecommended to restrict sodium intake, stop smoking, and continue medications. Shereports faircompliance with treatment. Sheis nothaving side effects.  Sheis notexercising. Sheisadherent to low salt diet.  Sheis experiencing none.  Patient denieschest pain, irregular heart beat and palpitations.  Cardiovascular risk factors includeadvanced age (older than 60 for men, 75 for women), dyslipidemia and smoking/ tobacco exposure.  Use of agents associated with hypertension:none.   ------------------------------------------------------------------------   Lipid/Cholesterol, Follow-up:   Last seen for this47monthsago. Management since that visit includesrecommended to restrict sodium intake, stop smoking, and continue medications.  Last Lipid Panel: Labs(Brief)   RecentLabs   Lab Results  Component Value Date   CHOL 143 10/10/2017   HDL 51 10/10/2017   LDLCALC 59 10/10/2017   TRIG 164 (H) 10/10/2017   CHOLHDL 2.8 10/10/2017    Shereports faircompliance with treatment. Sheis nothaving side effects.   Wt Readings from Last 3 Encounters:  01/09/18 149 lb 3.2 oz (67.7 kg)  12/01/17 149 lb (67.6 kg)  10/10/17 150 lb 12.8 oz (68.4 kg)   ------------------------------------------------------------------------ Past Medical History:  Diagnosis Date  . Diverticulitis   . Hypercholesteremia   . Hypertension   . Lupus ([place])   .  Osteoporosis   . Tobacco use    Past Surgical History:  Procedure Laterality Date  . ABDOMINAL HYSTERECTOMY  12/1969  . APPENDECTOMY  1957  . CATARACT EXTRACTION    . CHOLECYSTECTOMY  1997  . LAPAROSCOPIC SIGMOID COLECTOMY  2013   secondary to diverticulitis  . spit seed removal    . TONSILLECTOMY  1945   Family History  Problem Relation Age of Onset  . Alcohol abuse Mother   . Cirrhosis Mother   . Heart disease Father   . Hypertension Father   . Gout Father   . Ulcers Father   . Hyperlipidemia Father   . Alcohol abuse Brother   . Heart disease Brother        MI at age 32  . OCD Son   . Prostatitis Paternal Grandfather   . Pneumonia Paternal Grandfather   . Cancer Son    Allergies  Allergen Reactions  . Codeine Other (See Comments)    GI upset  . Cyclosporine Other (See Comments)    Burning eyes  . Other     Other reaction(s): OTHER  . Pregabalin Other (See Comments)    Personality changes-becomes angry  . Zolpidem     Other reaction(s): ITCHING    Current Outpatient Medications:  .  acetaminophen (TYLENOL) 500 MG tablet, Take 1,000 mg by mouth every 6 (six) hours as needed., Disp: , Rfl:  .  albuterol (PROVENTIL) (2.5 MG/3ML) 0.083% nebulizer solution, USE 1 VIAL IN NEBULIZER AS DIRECTED EVERY 4 HOURS AS NEEDED, Disp: 180 mL, Rfl: 3 .  ALPRAZolam (XANAX) 0.5 MG tablet, TAKE ONE TABLET BY MOUTH TWICE DAILY AS NEEDED, Disp: 60 tablet, Rfl: 1 .  aspirin 81 MG tablet, Take 81 mg by mouth daily., Disp: ,  Rfl:  .  atorvastatin (LIPITOR) 20 MG tablet, TAKE 1 TABLET BY MOUTH ONCE A DAY, Disp: 30 tablet, Rfl: 12 .  betamethasone dipropionate (DIPROLENE) 0.05 % cream, Apply 0.98 application topically 2 (two) times daily., Disp: , Rfl:  .  BIOTIN PO, Take by mouth., Disp: , Rfl:  .  Calcium Carbonate-Vitamin D (CALCIUM 600+D) 600-200 MG-UNIT TABS, Take 1 tablet by mouth 2 (two) times daily. , Disp: , Rfl:  .  Carboxymethylcellul-Glycerin (OPTIVE) 0.5-0.9 % SOLN, Apply to  eye., Disp: , Rfl:  .  clobetasol ointment (TEMOVATE) 0.05 %, Apply 1.19 application topically 2 (two) times daily., Disp: , Rfl:  .  DiphenhydrAMINE HCl (BENADRYL ALLERGY PO), Take by mouth as needed., Disp: , Rfl:  .  docusate sodium (COLACE) 100 MG capsule, Take 100 mg by mouth 2 (two) times daily., Disp: , Rfl:  .  folic acid (FOLVITE) 147 MCG tablet, Take 400 mcg by mouth daily., Disp: , Rfl:  .  gabapentin (NEURONTIN) 100 MG capsule, Take 100 mg by mouth 3 (three) times daily. , Disp: , Rfl:  .  hydrochlorothiazide (HYDRODIURIL) 50 MG tablet, TAKE 1 TABLET BY MOUTH ONCE A DAY, Disp: 30 tablet, Rfl: 12 .  hydroxychloroquine (PLAQUENIL) 200 MG tablet, Take by mouth., Disp: , Rfl:  .  KRILL OIL PO, Take by mouth., Disp: , Rfl:  .  methotrexate (RHEUMATREX) 2.5 MG tablet, Take 20 mg by mouth once a week. , Disp: , Rfl:  .  Multiple Vitamins-Minerals (MULTIVITAMIN ADULT PO), Take by mouth daily. , Disp: , Rfl:  .  Omega 3 1000 MG CAPS, Take 3 capsules by mouth daily., Disp: , Rfl:  .  Potassium 99 MG TABS, Take by mouth daily., Disp: , Rfl:  .  PROAIR HFA 108 (90 Base) MCG/ACT inhaler, INHALE 2 PUFFS EVERY 6 HOURS AS NEEDED, Disp: 8.5 g, Rfl: 3 .  ranitidine (ZANTAC) 150 MG tablet, Take 150 mg by mouth 2 (two) times daily., Disp: , Rfl:  .  Vitamin D, Cholecalciferol, 1000 units TABS, Take by mouth daily. , Disp: , Rfl:   Review of Systems  Constitutional: Positive for appetite change and fatigue.  Respiratory: Negative.   Cardiovascular: Negative.   Skin: Positive for rash.   Social History   Tobacco Use  . Smoking status: Current Every Day Smoker    Packs/day: 0.50    Types: Cigarettes  . Smokeless tobacco: Never Used  . Tobacco comment: started smoking at age 79  Substance Use Topics  . Alcohol use: No    Alcohol/week: 0.0 oz   Objective:   BP (!) 142/78 (BP Location: Right Arm, Patient Position: Sitting, Cuff Size: Normal)   Pulse 94   Temp 98 F (36.7 C) (Oral)   Wt 149  lb 3.2 oz (67.7 kg)   SpO2 99%   BMI 27.29 kg/m   Physical Exam  Constitutional: She is oriented to person, place, and time. She appears well-developed and well-nourished.  HENT:  Head: Normocephalic and atraumatic.  Right Ear: External ear normal.  Left Ear: External ear normal.  Nose: Nose normal.  Mouth/Throat: Oropharynx is clear and moist.  Eyes: Pupils are equal, round, and reactive to light. Conjunctivae and EOM are normal. Right eye exhibits no discharge.  Neck: Normal range of motion. Neck supple. No tracheal deviation present. No thyromegaly present.  Cardiovascular: Normal rate, regular rhythm, normal heart sounds and intact distal pulses.  No murmur heard. No bruits today but has history of bilateral carotid stenosis.  Pulmonary/Chest: Effort normal and breath sounds normal. No respiratory distress. She has no wheezes. She has no rales. She exhibits no tenderness.  Abdominal: Soft. Bowel sounds are normal. She exhibits no distension and no mass. There is no tenderness. There is no rebound and no guarding.  Musculoskeletal: She exhibits no edema or tenderness.  Intermittent pain in joints with swelling of wrists and hands. Crepitus in left knee. Numbness in plantar surface of both feet and toes to the base of toes dorsally. Good peripheral pulses.   Lymphadenopathy:    She has no cervical adenopathy.  Neurological: She is alert and oriented to person, place, and time. She has normal reflexes. She displays normal reflexes. No cranial nerve deficit. She exhibits normal muscle tone. Coordination abnormal.  Skin: Skin is warm and dry. Rash noted. No erythema.  Psychiatric: She has a normal mood and affect. Her behavior is normal. Judgment and thought content normal.      Assessment & Plan:     1. Essential hypertension Fair control of BP on the HCTZ 50 mg qd. Denies chest pains or palpitations. Recheck CBC, TSH and CMP. Follow up pending reports. - CBC with  Differential/Platelet - Comprehensive metabolic panel - TSH  2. Cutaneous lupus erythematosus Redness on extensor surfaces of elbows and intermittent joint pains. Followed by Dr. Laurence Ferrari (dermatologist) and Dr. Jefm Bryant (rheumatologist). Recheck routine labs and continue follow up as planned. - CBC with Differential/Platelet - Comprehensive metabolic panel - TSH  3. Seropositive rheumatoid arthritis (HCC) Pain and stiffness in multiple joints. No significant joint deformities, swelling or redness today. Takes Methotrexate and Plaquenil as prescribed by Dr. Jefm Bryant (rheumatologist). Will get some follow up labs and continue walker for ambulation outside of the home. Last follow up with Dr. Jefm Bryant was on 12-12-17. - CBC with Differential/Platelet  4. Mixed hyperlipidemia Tolerating Atorvastatin 20 mg qd and Krill Oil daily. Continue low fat diet and recheck labs. Follow up pending report. - Comprehensive metabolic panel - Lipid panel - TSH  5. Phobic anxiety disorder No significant panic attacks with use of Alprazolam 0.5 mg BID. Sleeps well with the last dose at bedtime each day. No depression or suicidal ideation.  6. Tobacco use Continues to smoke 1/2 ppd for the past 63 years. Doubt she will ever completely stop. Uses ProAir-HFA prn wheezing and dyspnea of COPD.  7. Peripheral polyneuropathy Stable and unchanged numbness in the plantar surface of both feet and dorsum of toes. No sign of corns or calluses. No sign of infection or ulcerations. Feels the Gabapentin 100 mg TID controls pains and uses a walker for balance issues. Continues follow up with rheumatologist.  8. Bilateral carotid artery stenosis No audible bruits today. Followed by Dr. Lucky Cowboy (vascular) on 12-01-17 with duplex velocities in the 40-59% range on the right and 1-39% range on the left. He plans no intervention at this time and will continue medical management with recheck in 1 year.       Vernie Murders, PA    [facility]

## 2018-01-10 LAB — CBC WITH DIFFERENTIAL/PLATELET
BASOS ABS: 0.1 10*3/uL (ref 0.0–0.2)
BASOS: 1 %
EOS (ABSOLUTE): 0.2 10*3/uL (ref 0.0–0.4)
Eos: 2 %
HEMATOCRIT: 43.3 % (ref 34.0–46.6)
HEMOGLOBIN: 15.1 g/dL (ref 11.1–15.9)
IMMATURE GRANS (ABS): 0 10*3/uL (ref 0.0–0.1)
Immature Granulocytes: 0 %
LYMPHS ABS: 1.4 10*3/uL (ref 0.7–3.1)
Lymphs: 19 %
MCH: 33.1 pg — AB (ref 26.6–33.0)
MCHC: 34.9 g/dL (ref 31.5–35.7)
MCV: 95 fL (ref 79–97)
MONOCYTES: 11 %
Monocytes Absolute: 0.9 10*3/uL (ref 0.1–0.9)
NEUTROS ABS: 5.1 10*3/uL (ref 1.4–7.0)
Neutrophils: 67 %
Platelets: 262 10*3/uL (ref 150–379)
RBC: 4.56 x10E6/uL (ref 3.77–5.28)
RDW: 13.8 % (ref 12.3–15.4)
WBC: 7.6 10*3/uL (ref 3.4–10.8)

## 2018-01-10 LAB — COMPREHENSIVE METABOLIC PANEL
A/G RATIO: 1.6 (ref 1.2–2.2)
ALBUMIN: 4.5 g/dL (ref 3.5–4.8)
ALK PHOS: 57 IU/L (ref 39–117)
ALT: 21 IU/L (ref 0–32)
AST: 21 IU/L (ref 0–40)
BILIRUBIN TOTAL: 0.4 mg/dL (ref 0.0–1.2)
BUN / CREAT RATIO: 18 (ref 12–28)
BUN: 12 mg/dL (ref 8–27)
CHLORIDE: 95 mmol/L — AB (ref 96–106)
CO2: 22 mmol/L (ref 20–29)
Calcium: 10.3 mg/dL (ref 8.7–10.3)
Creatinine, Ser: 0.65 mg/dL (ref 0.57–1.00)
GFR calc non Af Amer: 86 mL/min/{1.73_m2} (ref >59)
GFR, EST AFRICAN AMERICAN: 99 mL/min/{1.73_m2} (ref >59)
GLOBULIN, TOTAL: 2.8 g/dL (ref 1.5–4.5)
GLUCOSE: 93 mg/dL (ref 65–99)
POTASSIUM: 4.1 mmol/L (ref 3.5–5.2)
SODIUM: 135 mmol/L (ref 134–144)
TOTAL PROTEIN: 7.3 g/dL (ref 6.0–8.5)

## 2018-01-10 LAB — LIPID PANEL
CHOLESTEROL TOTAL: 144 mg/dL (ref 100–199)
Chol/HDL Ratio: 3.3 ratio (ref 0.0–4.4)
HDL: 43 mg/dL (ref >39)
LDL Calculated: 53 mg/dL (ref 0–99)
Triglycerides: 239 mg/dL — ABNORMAL HIGH (ref 0–149)
VLDL CHOLESTEROL CAL: 48 mg/dL — AB (ref 5–40)

## 2018-01-10 LAB — TSH: TSH: 0.857 u[IU]/mL (ref 0.450–4.500)

## 2018-01-11 ENCOUNTER — Telehealth: Payer: Self-pay

## 2018-01-11 MED ORDER — ATORVASTATIN CALCIUM 40 MG PO TABS: 40.0000 mg | ORAL_TABLET | Freq: Every day | ORAL | 0 refills | Status: DC

## 2018-01-11 NOTE — Telephone Encounter (Signed)
-----   Message from [name], [place] sent at 01/11/2018  9:18 AM EDT ----- All blood tests essentially normal except triglycerides worse. Continue low fat diet and increase Lipitor to 40 mg qd. Recheck levels in 3 months.

## 2018-01-11 NOTE — Telephone Encounter (Signed)
Patient advised. Follow up appointment already scheduled. RX for Atorvastatin 40 mg sent to [facility]. Patient states she has a new RX for 20 mg she just received so she will take 2 tablets of the 20 mg until she completes that RX.

## 2018-02-13 ENCOUNTER — Ambulatory Visit: Payer: Self-pay

## 2018-02-13 ENCOUNTER — Encounter: Payer: Self-pay | Admitting: Family Medicine

## 2018-02-19 ENCOUNTER — Other Ambulatory Visit: Payer: Self-pay | Admitting: Family Medicine

## 2018-02-19 DIAGNOSIS — F409 Phobic anxiety disorder, unspecified: Secondary | ICD-10-CM

## 2018-02-21 DIAGNOSIS — L930 Discoid lupus erythematosus: Secondary | ICD-10-CM | POA: Diagnosis not present

## 2018-02-21 DIAGNOSIS — R21 Rash and other nonspecific skin eruption: Secondary | ICD-10-CM | POA: Diagnosis not present

## 2018-02-27 DIAGNOSIS — Z79899 Other long term (current) drug therapy: Secondary | ICD-10-CM | POA: Diagnosis not present

## 2018-02-27 DIAGNOSIS — M0579 Rheumatoid arthritis with rheumatoid factor of multiple sites without organ or systems involvement: Secondary | ICD-10-CM | POA: Diagnosis not present

## 2018-02-27 DIAGNOSIS — J42 Unspecified chronic bronchitis: Secondary | ICD-10-CM | POA: Diagnosis not present

## 2018-02-27 DIAGNOSIS — L932 Other local lupus erythematosus: Secondary | ICD-10-CM | POA: Diagnosis not present

## 2018-03-06 DIAGNOSIS — H43313 Vitreous membranes and strands, bilateral: Secondary | ICD-10-CM | POA: Diagnosis not present

## 2018-03-06 DIAGNOSIS — H2101 Hyphema, right eye: Secondary | ICD-10-CM | POA: Diagnosis not present

## 2018-03-06 DIAGNOSIS — Z79899 Other long term (current) drug therapy: Secondary | ICD-10-CM | POA: Diagnosis not present

## 2018-03-12 ENCOUNTER — Other Ambulatory Visit: Payer: Self-pay | Admitting: Family Medicine

## 2018-04-04 DIAGNOSIS — Z79899 Other long term (current) drug therapy: Secondary | ICD-10-CM | POA: Diagnosis not present

## 2018-04-17 ENCOUNTER — Ambulatory Visit: Payer: Self-pay

## 2018-04-17 ENCOUNTER — Encounter: Payer: Self-pay | Admitting: Family Medicine

## 2018-04-17 ENCOUNTER — Ambulatory Visit (INDEPENDENT_AMBULATORY_CARE_PROVIDER_SITE_OTHER): Payer: Medicare Other

## 2018-04-17 VITALS — BP 134/78 | HR 96 | Temp 98.8°F | Ht 62.0 in | Wt 150.6 lb

## 2018-04-17 DIAGNOSIS — Z0000 Encounter for general adult medical examination without abnormal findings: Secondary | ICD-10-CM | POA: Diagnosis not present

## 2018-04-17 NOTE — Patient Instructions (Addendum)
Donna Mcneil , Thank you for taking time to come for your Medicare Wellness Visit. I appreciate your ongoing commitment to your health goals. Please review the following plan we discussed and let me know if I can assist you in the future.   Screening recommendations/referrals: Colonoscopy: N/A Mammogram: N/A Bone Density: Up to date Recommended yearly ophthalmology/optometry visit for glaucoma screening and checkup Recommended yearly dental visit for hygiene and checkup  Vaccinations: Influenza vaccine: N/A Pneumococcal vaccine: Up to date Tdap vaccine: Pt declines today.  Shingles vaccine: Pt declines today.     Advanced directives: Already on file.  Conditions/risks identified: Smoking cessation; Recommend increasing water intake to 4-6 glasses of water a day.   Next appointment: 05/14/18 @ 10:40 AM with Vernie Murders. Pt declined scheduling the AWV for 2020.    Preventive Care 88 Years and Older, Female Preventive care refers to lifestyle choices and visits with your health care provider that can promote health and wellness. What does preventive care include?  A yearly physical exam. This is also called an annual well check.  Dental exams once or twice a year.  Routine eye exams. Ask your health care provider how often you should have your eyes checked.  Personal lifestyle choices, including:  Daily care of your teeth and gums.  Regular physical activity.  Eating a healthy diet.  Avoiding tobacco and drug use.  Limiting alcohol use.  Practicing safe sex.  Taking low-dose aspirin every day.  Taking vitamin and mineral supplements as recommended by your health care provider. What happens during an annual well check? The services and screenings done by your health care provider during your annual well check will depend on your age, overall health, lifestyle risk factors, and family history of disease. Counseling  Your health care provider may ask you questions  about your:  Alcohol use.  Tobacco use.  Drug use.  Emotional well-being.  Home and relationship well-being.  Sexual activity.  Eating habits.  History of falls.  Memory and ability to understand (cognition).  Work and work Statistician.  Reproductive health. Screening  You may have the following tests or measurements:  Height, weight, and BMI.  Blood pressure.  Lipid and cholesterol levels. These may be checked every 5 years, or more frequently if you are over 69 years old.  Skin check.  Lung cancer screening. You may have this screening every year starting at age 58 if you have a 30-pack-year history of smoking and currently smoke or have quit within the past 15 years.  Fecal occult blood test (FOBT) of the stool. You may have this test every year starting at age 52.  Flexible sigmoidoscopy or colonoscopy. You may have a sigmoidoscopy every 5 years or a colonoscopy every 10 years starting at age 43.  Hepatitis C blood test.  Hepatitis B blood test.  Sexually transmitted disease (STD) testing.  Diabetes screening. This is done by checking your blood sugar (glucose) after you have not eaten for a while (fasting). You may have this done every 1-3 years.  Bone density scan. This is done to screen for osteoporosis. You may have this done starting at age 52.  Mammogram. This may be done every 1-2 years. Talk to your health care provider about how often you should have regular mammograms. Talk with your health care provider about your test results, treatment options, and if necessary, the need for more tests. Vaccines  Your health care provider may recommend certain vaccines, such as:  Influenza vaccine. This  is recommended every year.  Tetanus, diphtheria, and acellular pertussis (Tdap, Td) vaccine. You may need a Td booster every 10 years.  Zoster vaccine. You may need this after age 43.  Pneumococcal 13-valent conjugate (PCV13) vaccine. One dose is  recommended after age 69.  Pneumococcal polysaccharide (PPSV23) vaccine. One dose is recommended after age 58. Talk to your health care provider about which screenings and vaccines you need and how often you need them. This information is not intended to replace advice given to you by your health care provider. Make sure you discuss any questions you have with your health care provider. Document Released: 09/11/2015 Document Revised: 05/04/2016 Document Reviewed: 06/16/2015 Elsevier Interactive Patient Education  2017 [place] Prevention in the Home Falls can cause injuries. They can happen to people of all ages. There are many things you can do to make your home safe and to help prevent falls. What can I do on the outside of my home?  Regularly fix the edges of walkways and driveways and fix any cracks.  Remove anything that might make you trip as you walk through a door, such as a raised step or threshold.  Trim any bushes or trees on the path to your home.  Use bright outdoor lighting.  Clear any walking paths of anything that might make someone trip, such as rocks or tools.  Regularly check to see if handrails are loose or broken. Make sure that both sides of any steps have handrails.  Any raised decks and porches should have guardrails on the edges.  Have any leaves, snow, or ice cleared regularly.  Use sand or salt on walking paths during winter.  Clean up any spills in your garage right away. This includes oil or grease spills. What can I do in the bathroom?  Use night lights.  Install grab bars by the toilet and in the tub and shower. Do not use towel bars as grab bars.  Use non-skid mats or decals in the tub or shower.  If you need to sit down in the shower, use a plastic, non-slip stool.  Keep the floor dry. Clean up any water that spills on the floor as soon as it happens.  Remove soap buildup in the tub or shower regularly.  Attach bath mats  securely with double-sided non-slip rug tape.  Do not have throw rugs and other things on the floor that can make you trip. What can I do in the bedroom?  Use night lights.  Make sure that you have a light by your bed that is easy to reach.  Do not use any sheets or blankets that are too big for your bed. They should not hang down onto the floor.  Have a firm chair that has side arms. You can use this for support while you get dressed.  Do not have throw rugs and other things on the floor that can make you trip. What can I do in the kitchen?  Clean up any spills right away.  Avoid walking on wet floors.  Keep items that you use a lot in easy-to-reach places.  If you need to reach something above you, use a strong step stool that has a grab bar.  Keep electrical cords out of the way.  Do not use floor polish or wax that makes floors slippery. If you must use wax, use non-skid floor wax.  Do not have throw rugs and other things on the floor that can make you  trip. What can I do with my stairs?  Do not leave any items on the stairs.  Make sure that there are handrails on both sides of the stairs and use them. Fix handrails that are broken or loose. Make sure that handrails are as long as the stairways.  Check any carpeting to make sure that it is firmly attached to the stairs. Fix any carpet that is loose or worn.  Avoid having throw rugs at the top or bottom of the stairs. If you do have throw rugs, attach them to the floor with carpet tape.  Make sure that you have a light switch at the top of the stairs and the bottom of the stairs. If you do not have them, ask someone to add them for you. What else can I do to help prevent falls?  Wear shoes that:  Do not have high heels.  Have rubber bottoms.  Are comfortable and fit you well.  Are closed at the toe. Do not wear sandals.  If you use a stepladder:  Make sure that it is fully opened. Do not climb a closed  stepladder.  Make sure that both sides of the stepladder are locked into place.  Ask someone to hold it for you, if possible.  Clearly mark and make sure that you can see:  Any grab bars or handrails.  First and last steps.  Where the edge of each step is.  Use tools that help you move around (mobility aids) if they are needed. These include:  Canes.  Walkers.  Scooters.  Crutches.  Turn on the lights when you go into a dark area. Replace any light bulbs as soon as they burn out.  Set up your furniture so you have a clear path. Avoid moving your furniture around.  If any of your floors are uneven, fix them.  If there are any pets around you, be aware of where they are.  Review your medicines with your doctor. Some medicines can make you feel dizzy. This can increase your chance of falling. Ask your doctor what other things that you can do to help prevent falls. This information is not intended to replace advice given to you by your health care provider. Make sure you discuss any questions you have with your health care provider. Document Released: 06/11/2009 Document Revised: 01/21/2016 Document Reviewed: 09/19/2014 Elsevier Interactive Patient Education  2017 [employer].

## 2018-04-17 NOTE — Progress Notes (Addendum)
Subjective:   Donna Mcneil is a 77 y.o. female who presents for Medicare Annual (Subsequent) preventive examination.  Review of Systems:  N/A  Cardiac Risk Factors include: advanced age (>70men, >8 women);dyslipidemia;hypertension;smoking/ tobacco exposure     Objective:     Vitals: BP 134/78 (BP Location: Right Arm)   Pulse 96   Temp 98.8 F (37.1 C) (Oral)   Ht 5\' 2"  (1.575 m)   Wt 150 lb 9.6 oz (68.3 kg)   BMI 27.55 kg/m   Body mass index is 27.55 kg/m.  Advanced Directives 04/17/2018 01/30/2017 10/27/2016  Does Patient Have a Medical Advance Directive? Yes Yes Yes  Type of Paramedic of [place];Living will [place];Living will [place];Living will  Copy of [place] in Chart? Yes - -    Tobacco Social History   Tobacco Use  Smoking Status Current Every Day Smoker  . Packs/day: 0.50  . Types: Cigarettes  Smokeless Tobacco Never Used  Tobacco Comment   started smoking at age 37     Ready to quit: No Counseling given: No Comment: started smoking at age 84   Clinical Intake:  Pre-visit preparation completed: Yes  Pain : (Has pain all over due to RA.) Pain Score: (Has "pain all over")     Nutritional Status: BMI 25 -29 Overweight Nutritional Risks: None Diabetes: No  How often do you need to have someone help you when you read instructions, pamphlets, or other written materials from your doctor or pharmacy?: 1 - Never  Interpreter Needed?: No  Information entered by :: [facility], LPN  Past Medical History:  Diagnosis Date  . Diverticulitis   . Hypercholesteremia   . Hypertension   . Lupus ([place])   . Osteoporosis   . Tobacco use    Past Surgical History:  Procedure Laterality Date  . ABDOMINAL HYSTERECTOMY  12/1969  . APPENDECTOMY  1957  . CATARACT EXTRACTION    . CHOLECYSTECTOMY  1997  . LAPAROSCOPIC SIGMOID COLECTOMY  2013   secondary to  diverticulitis  . spit seed removal    . TONSILLECTOMY  1945   Family History  Problem Relation Age of Onset  . Alcohol abuse Mother   . Cirrhosis Mother   . Heart disease Father   . Hypertension Father   . Gout Father   . Ulcers Father   . Hyperlipidemia Father   . Alcohol abuse Brother   . Heart disease Brother        MI at age 33  . OCD Son   . Prostatitis Paternal Grandfather   . Pneumonia Paternal Grandfather   . Cancer Son    Social History   Socioeconomic History  . Marital status: Divorced    Spouse name: Not on file  . Number of children: 2  . Years of education: Not on file  . Highest education level: High school graduate  Occupational History  . Occupation: retired  Scientific laboratory technician  . Financial resource strain: Not hard at all  . Food insecurity:    Worry: Never true    Inability: Never true  . Transportation needs:    Medical: No    Non-medical: No  Tobacco Use  . Smoking status: Current Every Day Smoker    Packs/day: 0.50    Types: Cigarettes  . Smokeless tobacco: Never Used  . Tobacco comment: started smoking at age 81  Substance and Sexual Activity  . Alcohol use: No  Alcohol/week: 0.0 standard drinks  . Drug use: No  . Sexual activity: Not Currently  Lifestyle  . Physical activity:    Days per week: Not on file    Minutes per session: Not on file  . Stress: To some extent  Relationships  . Social connections:    Talks on phone: Not on file    Gets together: Not on file    Attends religious service: Not on file    Active member of club or organization: Not on file    Attends meetings of clubs or organizations: Not on file    Relationship status: Not on file  Other Topics Concern  . Not on file  Social History Narrative  . Not on file    Outpatient Encounter Medications as of 04/17/2018  Medication Sig  . acetaminophen (TYLENOL) 500 MG tablet Take 1,000 mg by mouth every 6 (six) hours as needed.  Marland Kitchen albuterol (PROVENTIL) (2.5 MG/3ML)  0.083% nebulizer solution USE 1 VIAL IN NEBULIZER AS DIRECTED EVERY 4 HOURS AS NEEDED  . ALPRAZolam (XANAX) 0.5 MG tablet TAKE 1 TABLET BY MOUTH TWICE A DAY AS NEEDED  . aspirin 81 MG tablet Take 81 mg by mouth daily.  Marland Kitchen atorvastatin (LIPITOR) 40 MG tablet Take 1 tablet (40 mg total) by mouth daily.  . betamethasone dipropionate (DIPROLENE) 0.05 % cream Apply 5.17 application topically 2 (two) times daily.   Marland Kitchen BIOTIN PO Take 5,000 mcg by mouth daily.   . Calcium Carbonate-Vitamin D (CALCIUM 600+D) 600-200 MG-UNIT TABS Take 1 tablet by mouth 2 (two) times daily.   . carboxymethylcellulose (REFRESH TEARS) 0.5 % SOLN 1 drop 3 (three) times daily as needed.  . cetirizine (ZYRTEC) 10 MG tablet Take 10 mg by mouth daily.  . clobetasol (TEMOVATE) 0.05 % external solution Apply 1 application topically as needed.  . clobetasol ointment (TEMOVATE) 0.05 % Apply 6.16 application topically 2 (two) times daily.  . Coenzyme Q10 (COQ10) 100 MG CAPS Take 100 mg by mouth daily.  . DiphenhydrAMINE HCl (BENADRYL ALLERGY PO) Take by mouth as needed.  . docusate sodium (COLACE) 100 MG capsule Take 100 mg by mouth 2 (two) times daily.  . folic acid (FOLVITE) 073 MCG tablet Take 1,000 mcg by mouth daily.   Marland Kitchen gabapentin (NEURONTIN) 100 MG capsule Take 100 mg by mouth 3 (three) times daily.   . hydrochlorothiazide (HYDRODIURIL) 50 MG tablet TAKE 1 TABLET BY MOUTH ONCE A DAY  . hydroxychloroquine (PLAQUENIL) 200 MG tablet Take 200 mg by mouth daily.   Marland Kitchen KRILL OIL PO Take 500 mg by mouth daily.   . methotrexate (RHEUMATREX) 2.5 MG tablet Take 20 mg by mouth once a week.   . Multiple Vitamins-Minerals (MULTIVITAMIN ADULT PO) Take by mouth daily.   . Omega 3 1000 MG CAPS Take 3 capsules by mouth daily.  . Potassium 99 MG TABS Take by mouth daily.  Marland Kitchen PROAIR HFA 108 (90 Base) MCG/ACT inhaler INHALE 2 PUFFS EVERY 6 HOURS AS NEEDED  . ranitidine (ZANTAC) 150 MG tablet Take 150 mg by mouth 2 (two) times daily.  . Vitamin D,  Cholecalciferol, 1000 units TABS Take by mouth daily.   . Carboxymethylcellul-Glycerin (OPTIVE) 0.5-0.9 % SOLN Apply to eye.   No facility-administered encounter medications on file as of 04/17/2018.     Activities of Daily Living In your present state of health, do you have any difficulty performing the following activities: 04/17/2018 10/10/2017  Hearing? N N  Vision? N Y  Difficulty  concentrating or making decisions? Y N  Walking or climbing stairs? N Y  Dressing or bathing? N N  Doing errands, shopping? Y -  Comment Does not drive. -  Preparing Food and eating ? N -  Using the Toilet? N -  In the past six months, have you accidently leaked urine? Y -  Comment Only with pressure, wears protection daily.  -  Do you have problems with loss of bowel control? N -  Managing your Medications? N -  Managing your Finances? N -  Housekeeping or managing your Housekeeping? Y -  Comment Has assistance with cleaning. -  Some recent data might be hidden    Patient Care Team: [name], [name], PA as PCP - General (Family Medicine) Emmaline Kluver., MD (Rheumatology) Alfonso Patten, MD as Consulting Physician (Dermatology) Agapito Games (Optometry) Lucky Cowboy Erskine Squibb, MD as Referring Physician (Vascular Surgery)    Assessment:   This is a routine wellness examination for [facility].  Exercise Activities and Dietary recommendations Current Exercise Habits: The patient does not participate in regular exercise at present, Exercise limited by: respiratory conditions(s)  Goals    . DIET - INCREASE WATER INTAKE     Recommend increasing water intake to 4-6 glasses of water a day.        Fall Risk Fall Risk  04/17/2018 10/10/2017 01/30/2017 10/27/2016  Falls in the past year? No No No No   Is the patient's home free of loose throw rugs in walkways, pet beds, electrical cords, etc?   yes      Grab bars in the bathroom? yes      Handrails on the stairs?   no      Adequate lighting?    yes  Timed Get Up and Go performed: N/A  Depression Screen PHQ 2/9 Scores 04/17/2018 10/10/2017 01/30/2017 10/27/2016  PHQ - 2 Score 3 1 2 2   PHQ- 9 Score 8 - 6 6     Cognitive Function: Pt declined screening today.         Immunization History  Administered Date(s) Administered  . Influenza Split 08/15/2014  . Influenza, High Dose Seasonal PF 07/04/2017  . Influenza, Seasonal, Injecte, Preservative Fre 07/12/2010, 07/04/2011  . Influenza-Unspecified 07/17/2015, 07/02/2016  . Pneumococcal Conjugate-13 07/04/2017  . Pneumococcal Polysaccharide-23 07/12/2010, 07/17/2015    Qualifies for Shingles Vaccine? Due for Shingles vaccine. Declined my offer to administer today. Education has been provided regarding the importance of this vaccine. Pt has been advised to call her insurance company to determine her out of pocket expense. Advised she may also receive this vaccine at her local pharmacy or Health Dept. Verbalized acceptance and understanding.  Screening Tests Health Maintenance  Topic Date Due  . INFLUENZA VACCINE  03/29/2018  . TETANUS/TDAP  07/04/2018 (Originally 07/11/1960)  . DEXA SCAN  Completed  . PNA vac Low Risk Adult  Completed    Cancer Screenings: Lung: Low Dose CT Chest recommended if Age 75-80 years, 30 pack-year currently smoking OR have quit w/in 15years. Patient does qualify, however declines due to having a recent chest x-ray with Dr Jefm Bryant.  Breast:  Up to date on Mammogram? N/A Up to date of Bone Density/Dexa? Yes Colorectal: N/A  Additional Screenings:  Hepatitis C Screening: N/A     Plan:  I have personally reviewed and addressed the Medicare Annual Wellness questionnaire and have noted the following in the patient's chart:  A. Medical and social history B. Use of alcohol, tobacco or illicit drugs  C. Current medications and supplements D. Functional ability and status E.  Nutritional status F.  Physical activity G. Advance directives H. List of  other physicians I.  Hospitalizations, surgeries, and ER visits in previous 12 months J.  [place] such as hearing and vision if needed, cognitive and depression L. Referrals and appointments - none  In addition, I have reviewed and discussed with patient certain preventive protocols, quality metrics, and best practice recommendations. A written personalized care plan for preventive services as well as general preventive health recommendations were provided to patient.  See attached scanned questionnaire for additional information.   Signed,  Fabio Neighbors, LPN Nurse Health Advisor   Nurse Recommendations: Pt declined the tetanus vaccine today.   Reviewed documentation and recommendations in Nurse Health Advisor's note. Agree with plan.

## 2018-04-24 ENCOUNTER — Other Ambulatory Visit: Payer: Self-pay | Admitting: Family Medicine

## 2018-04-24 DIAGNOSIS — F409 Phobic anxiety disorder, unspecified: Secondary | ICD-10-CM

## 2018-05-14 ENCOUNTER — Encounter: Payer: Self-pay | Admitting: Family Medicine

## 2018-05-14 ENCOUNTER — Ambulatory Visit (INDEPENDENT_AMBULATORY_CARE_PROVIDER_SITE_OTHER): Payer: Medicare Other | Admitting: Family Medicine

## 2018-05-14 ENCOUNTER — Other Ambulatory Visit: Payer: Self-pay | Admitting: Family Medicine

## 2018-05-14 VITALS — BP 138/72 | HR 86 | Temp 98.2°F | Resp 20 | Ht 62.0 in | Wt 149.8 lb

## 2018-05-14 DIAGNOSIS — J418 Mixed simple and mucopurulent chronic bronchitis: Secondary | ICD-10-CM | POA: Diagnosis not present

## 2018-05-14 DIAGNOSIS — E782 Mixed hyperlipidemia: Secondary | ICD-10-CM

## 2018-05-14 DIAGNOSIS — Z23 Encounter for immunization: Secondary | ICD-10-CM

## 2018-05-14 DIAGNOSIS — M0579 Rheumatoid arthritis with rheumatoid factor of multiple sites without organ or systems involvement: Secondary | ICD-10-CM | POA: Diagnosis not present

## 2018-05-14 DIAGNOSIS — Z720 Tobacco use: Secondary | ICD-10-CM | POA: Diagnosis not present

## 2018-05-14 DIAGNOSIS — I6523 Occlusion and stenosis of bilateral carotid arteries: Secondary | ICD-10-CM

## 2018-05-14 DIAGNOSIS — F409 Phobic anxiety disorder, unspecified: Secondary | ICD-10-CM

## 2018-05-14 NOTE — Progress Notes (Signed)
Patient: Donna Mcneil, Female    DOB: [birth year], 77 y.o.   MRN: [id number] Visit Date: 05/14/2018  Today's Provider: Vernie Murders, PA   Chief Complaint  Patient presents with  . Medicare Wellness   Subjective:     Complete Physical Donna Mcneil is a 77 y.o. female. She feels well. She reports exercising none. She reports she is sleeping fairly well.  -----------------------------------------------------------  Review of Systems  Constitutional: Negative.   HENT: Negative.   Eyes: Negative.   Respiratory: Negative.   Cardiovascular: Negative.   Gastrointestinal: Negative.   Endocrine: Negative.   Genitourinary: Negative.   Musculoskeletal: Negative.   Skin: Negative.   Allergic/Immunologic: Negative.   Neurological: Negative.   Hematological: Negative.   Psychiatric/Behavioral: Negative.    Social History   Socioeconomic History  . Marital status: Divorced    Spouse name: Not on file  . Number of children: 2  . Years of education: Not on file  . Highest education level: High school graduate  Occupational History  . Occupation: retired  Scientific laboratory technician  . Financial resource strain: Not hard at all  . Food insecurity:    Worry: Never true    Inability: Never true  . Transportation needs:    Medical: No    Non-medical: No  Tobacco Use  . Smoking status: Current Every Day Smoker    Packs/day: 0.50    Types: Cigarettes  . Smokeless tobacco: Never Used  . Tobacco comment: started smoking at age 44  Substance and Sexual Activity  . Alcohol use: No    Alcohol/week: 0.0 standard drinks  . Drug use: No  . Sexual activity: Not Currently  Lifestyle  . Physical activity:    Days per week: Not on file    Minutes per session: Not on file  . Stress: To some extent  Relationships  . Social connections:    Talks on phone: Not on file    Gets together: Not on file    Attends religious service: Not on file    Active member of club or  organization: Not on file    Attends meetings of clubs or organizations: Not on file    Relationship status: Not on file  . Intimate partner violence:    Fear of current or ex partner: Not on file    Emotionally abused: Not on file    Physically abused: Not on file    Forced sexual activity: Not on file  Other Topics Concern  . Not on file  Social History Narrative  . Not on file   Past Medical History:  Diagnosis Date  . Diverticulitis   . Hypercholesteremia   . Hypertension   . Lupus ([place])   . Osteoporosis   . Tobacco use     Patient Active Problem List   Diagnosis Date Noted  . 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 Rheumatoid arthritis involving multiple sites with positive rheumatoid factor ([place]) 12/12/2017  . Tobacco use 04/14/2017  . Carotid stenosis 04/14/2017  . Peripheral neuropathy 06/24/2016  . 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 Chronic bronchitis ([place]) 11/13/2015  . Osteoarthritis 10/05/2015  . Hyperlipidemia 10/05/2015  . Hypertension 10/05/2015  . Osteoporosis 10/05/2015  . Acid reflux 10/05/2015  . Peptic ulcer 10/05/2015  . Cataract 10/05/2015  . Tendinitis 10/05/2015  . Osteopenia 02/11/2014  . 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 ABDOMINAL HYSTERECTOMY  12/1969  . APPENDECTOMY  1957  .  CATARACT EXTRACTION    . CHOLECYSTECTOMY  1997  . LAPAROSCOPIC SIGMOID COLECTOMY  2013   secondary to diverticulitis  . spit seed removal    . TONSILLECTOMY  1945   Her family history includes Alcohol abuse in her brother and mother; Cancer in her son; Cirrhosis in her mother; Gout in her father; Heart disease in her brother and father; Hyperlipidemia in her father; Hypertension in her father; OCD in her son; Pneumonia in her paternal grandfather; Prostatitis in her paternal grandfather; Ulcers in her father.      Current Outpatient Medications:  .  acetaminophen (TYLENOL) 500 MG  tablet, Take 1,000 mg by mouth every 6 (six) hours as needed., Disp: , Rfl:  .  albuterol (PROVENTIL) (2.5 MG/3ML) 0.083% nebulizer solution, USE 1 VIAL IN NEBULIZER AS DIRECTED EVERY 4 HOURS AS NEEDED, Disp: 180 mL, Rfl: 3 .  ALPRAZolam (XANAX) 0.5 MG tablet, TAKE 1 TABLET BY MOUTH TWICE A DAY AS NEEDED, Disp: 60 tablet, Rfl: 0 .  aspirin 81 MG tablet, Take 81 mg by mouth daily., Disp: , Rfl:  .  atorvastatin (LIPITOR) 40 MG tablet, Take 1 tablet (40 mg total) by mouth daily., Disp: 90 tablet, Rfl: 0 .  betamethasone dipropionate (DIPROLENE) 0.05 % cream, Apply 1.61 application topically 2 (two) times daily. , Disp: , Rfl:  .  BIOTIN PO, Take 5,000 mcg by mouth daily. , Disp: , Rfl:  .  Calcium Carbonate-Vitamin D (CALCIUM 600+D) 600-200 MG-UNIT TABS, Take 1 tablet by mouth 2 (two) times daily. , Disp: , Rfl:  .  Carboxymethylcellul-Glycerin (OPTIVE) 0.5-0.9 % SOLN, Apply to eye., Disp: , Rfl:  .  carboxymethylcellulose (REFRESH TEARS) 0.5 % SOLN, 1 drop 3 (three) times daily as needed., Disp: , Rfl:  .  cetirizine (ZYRTEC) 10 MG tablet, Take 10 mg by mouth daily., Disp: , Rfl:  .  clobetasol (TEMOVATE) 0.05 % external solution, Apply 1 application topically as needed., Disp: , Rfl:  .  clobetasol ointment (TEMOVATE) 0.05 %, Apply 0.96 application topically 2 (two) times daily., Disp: , Rfl:  .  Coenzyme Q10 (COQ10) 100 MG CAPS, Take 100 mg by mouth daily., Disp: , Rfl:  .  docusate sodium (COLACE) 100 MG capsule, Take 100 mg by mouth 2 (two) times daily., Disp: , Rfl:  .  folic acid (FOLVITE) 045 MCG tablet, Take 1,000 mcg by mouth daily. , Disp: , Rfl:  .  gabapentin (NEURONTIN) 100 MG capsule, Take 100 mg by mouth 3 (three) times daily. , Disp: , Rfl:  .  hydrochlorothiazide (HYDRODIURIL) 50 MG tablet, TAKE 1 TABLET BY MOUTH ONCE A DAY, Disp: 30 tablet, Rfl: 12 .  hydroxychloroquine (PLAQUENIL) 200 MG tablet, Take 200 mg by mouth daily. , Disp: , Rfl:  .  KRILL OIL PO, Take 500 mg by mouth  daily. , Disp: , Rfl:  .  methotrexate (RHEUMATREX) 2.5 MG tablet, Take 20 mg by mouth once a week. , Disp: , Rfl:  .  Multiple Vitamins-Minerals (MULTIVITAMIN ADULT PO), Take by mouth daily. , Disp: , Rfl:  .  Omega 3 1000 MG CAPS, Take 3 capsules by mouth daily., Disp: , Rfl:  .  Potassium 99 MG TABS, Take by mouth daily., Disp: , Rfl:  .  PROAIR HFA 108 (90 Base) MCG/ACT inhaler, INHALE 2 PUFFS EVERY 6 HOURS AS NEEDED, Disp: 8.5 g, Rfl: 3 .  ranitidine (ZANTAC) 150 MG tablet, Take 150 mg by mouth 2 (two) times daily., Disp: , Rfl:  .  Vitamin D, Cholecalciferol, 1000 units TABS, Take by mouth daily. , Disp: , Rfl:   Patient Care Team: Beola Vasallo, [name], PA as PCP - General (Family Medicine) Emmaline Kluver., MD (Rheumatology) Laurence Ferrari, [place], MD as Consulting Physician (Dermatology) Agapito Games [facility]) Lucky Cowboy, Erskine Squibb, MD as Referring Physician (Vascular Surgery)     Objective:   Vitals: BP 138/72 (BP Location: Left Arm, Patient Position: Sitting, Cuff Size: Normal)   Pulse 86   Temp 98.2 F (36.8 C) (Oral)   Resp 20   Ht 5\' 2"  (1.575 m)   Wt 149 lb 12.8 oz (67.9 kg)   SpO2 95%   BMI 27.40 kg/m   Wt Readings from Last 3 Encounters:  05/14/18 149 lb 12.8 oz (67.9 kg)  04/17/18 150 lb 9.6 oz (68.3 kg)  01/09/18 149 lb 3.2 oz (67.7 kg)   Physical Exam  Constitutional: She is oriented to person, place, and time. She appears well-developed and well-nourished. No distress.  HENT:  Head: Normocephalic and atraumatic.  Right Ear: Hearing normal.  Left Ear: Hearing normal.  Nose: Nose normal.  Eyes: Conjunctivae and lids are normal. Right eye exhibits no discharge. Left eye exhibits no discharge. No scleral icterus.  Pulmonary/Chest: Effort normal. No respiratory distress.  Musculoskeletal:  Intermittent pain in joints with swelling of wrists and hands. Crepitus in left knee. Numbness in plantar surface of both feet and toes to the base of toes dorsally. Good  peripheral pulses.   Neurological: She is alert and oriented to person, place, and time.  Skin: Skin is intact. No lesion and no rash noted.  Psychiatric: She has a normal mood and affect. Her speech is normal and behavior is normal. Thought content normal.    Activities of Daily Living In your present state of health, do you have any difficulty performing the following activities: 04/17/2018 10/10/2017  Hearing? N N  Vision? N Y  Difficulty concentrating or making decisions? Y N  Walking or climbing stairs? N Y  Dressing or bathing? N N  Doing errands, shopping? Y -  Comment Does not drive. -  Preparing Food and eating ? N -  Using the Toilet? N -  In the past six months, have you accidently leaked urine? Y -  Comment Only with pressure, wears protection daily.  -  Do you have problems with loss of bowel control? N -  Managing your Medications? N -  Managing your Finances? N -  Housekeeping or managing your Housekeeping? Y -  Comment Has assistance with cleaning. -  Some recent data might be hidden    Fall Risk Assessment Fall Risk  04/17/2018 10/10/2017 01/30/2017 10/27/2016  Falls in the past year? No No No No     Depression Screen PHQ 2/9 Scores 04/17/2018 10/10/2017 01/30/2017 10/27/2016  PHQ - 2 Score 3 1 2 2   PHQ- 9 Score 8 - 6 6    Cognitive Testing - 6-CIT  Correct? Score   What year is it? yes 0 0 or 4  What month is it? yes 0 0 or 3  Memorize:    Pia Mau,  41,  [place],      What time is it? (within 1 hour) yes 0 0 or 3  Count backwards from 20 yes 0 0, 2, or 4  Name the months of the year yes 0 0, 2, or 4  Repeat name & address above yes 0 0, 2, 4, 6, 8, or 10  TOTAL SCORE  0/28   Interpretation:  Normal  Normal (0-7) Abnormal (8-28)       Assessment & Plan:    Annual Physical Reviewed patient's Family Medical History Reviewed and updated list of patient's medical providers Assessment of cognitive impairment was done Assessed patient's  functional ability Established a written schedule for health screening [place] Completed and Reviewed  Exercise Activities and Dietary recommendations Goals    . DIET - INCREASE WATER INTAKE     Recommend increasing water intake to 4-6 glasses of water a day.        Immunization History  Administered Date(s) Administered  . Influenza Split 08/15/2014  . Influenza, High Dose Seasonal PF 07/04/2017  . Influenza, Seasonal, Injecte, Preservative Fre 07/12/2010, 07/04/2011  . Influenza-Unspecified 07/17/2015, 07/02/2016  . Pneumococcal Conjugate-13 07/04/2017  . Pneumococcal Polysaccharide-23 07/12/2010, 07/17/2015    Health Maintenance  Topic Date Due  . INFLUENZA VACCINE  03/29/2018  . TETANUS/TDAP  07/04/2018 (Originally 07/11/1960)  . DEXA SCAN  Completed  . PNA vac Low Risk Adult  Completed     Discussed health benefits of physical activity, and encouraged her to engage in regular exercise appropriate for her age and condition.    ------------------------------------------------------------------------------------------------------------ 1. Rheumatoid arthritis involving multiple sites with positive rheumatoid factor (HCC)  Followed by Dr. Jefm Bryant (rheumatologist) and on the Plaquenil 200 mg qd and Methotrexate 20 mg once a week. Has some peripheral neuropathy in feet and takes Gabapentin 100 mg TID with control of discomfort. Continue follow up with rheumatologist and recheck CBC with CMP. - CBC with Differential/Platelet - Comprehensive metabolic panel  2. Mixed hyperlipidemia Continues to tolerate the Atorvastatin 20 mg qd and trying to work on a low fat diet. Arthritis and chronic bronchitis limits abilities to exercise. Recheck labs and follow up pending reports. - Comprehensive metabolic panel - Lipid panel - TSH  3. Phobic anxiety disorder Anxiety worse at night and has difficulty falling asleep unless she takes the Alprazolam 0.5 mg at  bedtime. Sometimes will take an extra dose for daytime phobic symptoms. Recheck CBC, TSH and CMP. Follow up in 3 months. Need to consider change to Trazodone but she refuses. - CBC with Differential/Platelet - Comprehensive metabolic panel - TSH  4. Tobacco use Encouraged to stop all smoking. Still gets 1 ppd.   5. Mixed simple and mucopurulent chronic bronchitis (HCC) Feels dyspnea and chronic cough with sputum production in the morning is stable and controlled with using ProAir-HFA prn rescue from wheeze. Still smoking up to 1ppd. Recheck routine labs and follow up appointment in 3 months. Pulse oximetry 95% today on room air. - CBC with Differential/Platelet  6. Bilateral carotid artery stenosis Stable without bruit heard today. Followed by Dr. Lucky Cowboy (vascular) on 12-01-17 with carotid duplex showing velocities in the 40-59% range on the right and 1-39% range on the left. He recommended continuing medical management and follow up with him in a year. Patient denies dizziness or faint sensations.  7. Need for influenza vaccination - Flu vaccine HIGH DOSE PF    Vernie Murders, PA  [facility]

## 2018-05-15 ENCOUNTER — Telehealth: Payer: Self-pay

## 2018-05-15 LAB — CBC WITH DIFFERENTIAL/PLATELET
BASOS: 1 %
Basophils Absolute: 0.1 10*3/uL (ref 0.0–0.2)
EOS (ABSOLUTE): 0.1 10*3/uL (ref 0.0–0.4)
Eos: 2 %
HEMATOCRIT: 41 % (ref 34.0–46.6)
Hemoglobin: 15.1 g/dL (ref 11.1–15.9)
IMMATURE GRANULOCYTES: 0 %
Immature Grans (Abs): 0 10*3/uL (ref 0.0–0.1)
Lymphocytes Absolute: 1.9 10*3/uL (ref 0.7–3.1)
Lymphs: 27 %
MCH: 33.3 pg — AB (ref 26.6–33.0)
MCHC: 36.8 g/dL — AB (ref 31.5–35.7)
MCV: 91 fL (ref 79–97)
MONOS ABS: 0.8 10*3/uL (ref 0.1–0.9)
Monocytes: 11 %
NEUTROS PCT: 59 %
Neutrophils Absolute: 4.3 10*3/uL (ref 1.4–7.0)
Platelets: 224 10*3/uL (ref 150–450)
RBC: 4.53 x10E6/uL (ref 3.77–5.28)
RDW: 13.8 % (ref 12.3–15.4)
WBC: 7.2 10*3/uL (ref 3.4–10.8)

## 2018-05-15 LAB — COMPREHENSIVE METABOLIC PANEL
ALK PHOS: 57 IU/L (ref 39–117)
ALT: 24 IU/L (ref 0–32)
AST: 23 IU/L (ref 0–40)
Albumin/Globulin Ratio: 1.5 (ref 1.2–2.2)
Albumin: 4.5 g/dL (ref 3.5–4.8)
BILIRUBIN TOTAL: 0.5 mg/dL (ref 0.0–1.2)
BUN/Creatinine Ratio: 11 — ABNORMAL LOW (ref 12–28)
BUN: 8 mg/dL (ref 8–27)
CHLORIDE: 96 mmol/L (ref 96–106)
CO2: 23 mmol/L (ref 20–29)
CREATININE: 0.76 mg/dL (ref 0.57–1.00)
Calcium: 9.9 mg/dL (ref 8.7–10.3)
GFR calc Af Amer: 88 mL/min/{1.73_m2} (ref >59)
GFR calc non Af Amer: 76 mL/min/{1.73_m2} (ref >59)
GLOBULIN, TOTAL: 3 g/dL (ref 1.5–4.5)
Glucose: 89 mg/dL (ref 65–99)
Potassium: 3.9 mmol/L (ref 3.5–5.2)
SODIUM: 136 mmol/L (ref 134–144)
Total Protein: 7.5 g/dL (ref 6.0–8.5)

## 2018-05-15 LAB — LIPID PANEL
CHOL/HDL RATIO: 2.2 ratio (ref 0.0–4.4)
CHOLESTEROL TOTAL: 124 mg/dL (ref 100–199)
HDL: 57 mg/dL (ref >39)
LDL Calculated: 35 mg/dL (ref 0–99)
TRIGLYCERIDES: 158 mg/dL — AB (ref 0–149)
VLDL Cholesterol Cal: 32 mg/dL (ref 5–40)

## 2018-05-15 LAB — TSH: TSH: 0.721 u[IU]/mL (ref 0.450–4.500)

## 2018-05-15 NOTE — Telephone Encounter (Signed)
Patient advised as directed below.  Thanks,  -Khristen Cheyney 

## 2018-05-15 NOTE — Telephone Encounter (Signed)
-----   Message from [name], [place] sent at 05/15/2018  8:07 AM EDT ----- All blood tests essentially normal with only mild variations. Continue present medications and recheck in 3-4 months.

## 2018-05-24 ENCOUNTER — Other Ambulatory Visit: Payer: Self-pay | Admitting: Family Medicine

## 2018-05-24 DIAGNOSIS — F409 Phobic anxiety disorder, unspecified: Secondary | ICD-10-CM

## 2018-07-23 ENCOUNTER — Other Ambulatory Visit: Payer: Self-pay | Admitting: Family Medicine

## 2018-07-23 DIAGNOSIS — F409 Phobic anxiety disorder, unspecified: Secondary | ICD-10-CM

## 2018-07-31 DIAGNOSIS — H1033 Unspecified acute conjunctivitis, bilateral: Secondary | ICD-10-CM | POA: Diagnosis not present

## 2018-08-02 ENCOUNTER — Telehealth: Payer: Self-pay

## 2018-08-02 NOTE — Telephone Encounter (Signed)
Just had labs done on 05-27-18 and not due for 4 weeks. She has an appointment with Dr. Jefm Bryant on 08-28-18. Would recommend seeing him to be sure he does not have any specific lab tests he would like ordered.

## 2018-08-02 NOTE — Telephone Encounter (Signed)
Patient called office requesting screening lab orders. Patient would like a call back when available for pick up. KW

## 2018-08-02 NOTE — Telephone Encounter (Signed)
Please advise 

## 2018-08-03 NOTE — Telephone Encounter (Addendum)
Patient was advised. Patient stated her breathing has gotten a little worse since last ov. Advised patient to go ahead and schedule an appt now to dis COPD with provider. Patient stated she wants to wait till she sees Dr. Jefm Bryant before she schedules ov with pcp. Advised pt to cb for appt if symptoms worsen.

## 2018-08-03 NOTE — Telephone Encounter (Signed)
Agree with plan 

## 2018-08-20 ENCOUNTER — Other Ambulatory Visit: Payer: Self-pay | Admitting: Family Medicine

## 2018-08-20 DIAGNOSIS — F409 Phobic anxiety disorder, unspecified: Secondary | ICD-10-CM

## 2018-08-28 DIAGNOSIS — J42 Unspecified chronic bronchitis: Secondary | ICD-10-CM | POA: Diagnosis not present

## 2018-08-28 DIAGNOSIS — M0579 Rheumatoid arthritis with rheumatoid factor of multiple sites without organ or systems involvement: Secondary | ICD-10-CM | POA: Diagnosis not present

## 2018-08-28 DIAGNOSIS — L932 Other local lupus erythematosus: Secondary | ICD-10-CM | POA: Diagnosis not present

## 2018-08-30 ENCOUNTER — Telehealth: Payer: Self-pay

## 2018-08-30 ENCOUNTER — Other Ambulatory Visit: Payer: Self-pay | Admitting: Family Medicine

## 2018-08-30 DIAGNOSIS — J449 Chronic obstructive pulmonary disease, unspecified: Secondary | ICD-10-CM

## 2018-08-30 DIAGNOSIS — F409 Phobic anxiety disorder, unspecified: Secondary | ICD-10-CM

## 2018-08-30 DIAGNOSIS — E782 Mixed hyperlipidemia: Secondary | ICD-10-CM

## 2018-08-30 DIAGNOSIS — M0579 Rheumatoid arthritis with rheumatoid factor of multiple sites without organ or systems involvement: Secondary | ICD-10-CM

## 2018-08-30 DIAGNOSIS — J4489 Other specified chronic obstructive pulmonary disease: Secondary | ICD-10-CM

## 2018-08-30 NOTE — Telephone Encounter (Signed)
Patient reports that she is having trouble with her breathing when she is laying down and walks from the living room to the bathroom.Patient reports that she has been feeling this way since the end of November. She went to her Rheumatologist and he prescribed her Zpak and Prednisone and she will finished this January the 5th for her Bronchitis and to help her symptoms since she has COPD. Patient reports that she breaths good when she is sitting on her recliner. Patient reports that she is able to come in Thursday January 9 at 9:20 because she is also requesting lab work to be done. Please advise.  Thanks,  -Dashea Mcmullan.

## 2018-08-30 NOTE — Telephone Encounter (Signed)
Patient advised as directed below. Patient states she would still like to see the provider. Patient scheduled for Thursday January 9 at 9:20 am. Released labs and placed them up front for when patient comes in to get them.

## 2018-08-30 NOTE — Telephone Encounter (Signed)
Glad to see her anytime.

## 2018-08-30 NOTE — Telephone Encounter (Signed)
Finish all the Z-pak and Prednisone (the Z-pak will still be in her blood stream for 5 days after she finishes the tablets). Future order for labs placed in chart and can be released whenever she comes by the office to take the requisition to the lab.

## 2018-08-31 ENCOUNTER — Telehealth: Payer: Self-pay | Admitting: Family Medicine

## 2018-08-31 NOTE — Telephone Encounter (Signed)
Recommend holding getting any blood tests until seen on 03-29-81 so we don't duplicate anything she would have to pay for out of pocket.

## 2018-08-31 NOTE — Telephone Encounter (Signed)
Please review and advise. KW 

## 2018-08-31 NOTE — Telephone Encounter (Signed)
[name]. Pt states that Donna Mcneil lets her combine the ones he give and Dr. Jefm Bryant does.  Please advise.  Thanks, [employer]

## 2018-09-01 ENCOUNTER — Other Ambulatory Visit: Payer: Self-pay

## 2018-09-01 ENCOUNTER — Inpatient Hospital Stay (HOSPITAL_COMMUNITY)
Admission: EM | Admit: 2018-09-01 | Discharge: 2018-09-06 | DRG: 180 | Disposition: A | Discharge status: Home Health | Payer: Medicare Other | Attending: Internal Medicine | Admitting: Internal Medicine

## 2018-09-01 ENCOUNTER — Encounter (HOSPITAL_COMMUNITY): Payer: Self-pay | Admitting: Emergency Medicine

## 2018-09-01 ENCOUNTER — Inpatient Hospital Stay (HOSPITAL_COMMUNITY): Payer: Medicare Other

## 2018-09-01 ENCOUNTER — Emergency Department (HOSPITAL_COMMUNITY): Payer: Medicare Other

## 2018-09-01 DIAGNOSIS — Z79899 Other long term (current) drug therapy: Secondary | ICD-10-CM

## 2018-09-01 DIAGNOSIS — J181 Lobar pneumonia, unspecified organism: Secondary | ICD-10-CM

## 2018-09-01 DIAGNOSIS — M810 Age-related osteoporosis without current pathological fracture: Secondary | ICD-10-CM | POA: Diagnosis not present

## 2018-09-01 DIAGNOSIS — J90 Pleural effusion, not elsewhere classified: Secondary | ICD-10-CM

## 2018-09-01 DIAGNOSIS — R911 Solitary pulmonary nodule: Secondary | ICD-10-CM

## 2018-09-01 DIAGNOSIS — I493 Ventricular premature depolarization: Secondary | ICD-10-CM | POA: Diagnosis present

## 2018-09-01 DIAGNOSIS — R846 Abnormal cytological findings in specimens from respiratory organs and thorax: Secondary | ICD-10-CM | POA: Diagnosis not present

## 2018-09-01 DIAGNOSIS — C3492 Malignant neoplasm of unspecified part of left bronchus or lung: Secondary | ICD-10-CM | POA: Diagnosis not present

## 2018-09-01 DIAGNOSIS — E871 Hypo-osmolality and hyponatremia: Secondary | ICD-10-CM | POA: Diagnosis not present

## 2018-09-01 DIAGNOSIS — M0579 Rheumatoid arthritis with rheumatoid factor of multiple sites without organ or systems involvement: Secondary | ICD-10-CM | POA: Diagnosis not present

## 2018-09-01 DIAGNOSIS — Z90710 Acquired absence of both cervix and uterus: Secondary | ICD-10-CM | POA: Diagnosis not present

## 2018-09-01 DIAGNOSIS — Z8249 Family history of ischemic heart disease and other diseases of the circulatory system: Secondary | ICD-10-CM | POA: Diagnosis not present

## 2018-09-01 DIAGNOSIS — J440 Chronic obstructive pulmonary disease with acute lower respiratory infection: Secondary | ICD-10-CM | POA: Diagnosis not present

## 2018-09-01 DIAGNOSIS — Z9981 Dependence on supplemental oxygen: Secondary | ICD-10-CM | POA: Diagnosis not present

## 2018-09-01 DIAGNOSIS — J9601 Acute respiratory failure with hypoxia: Secondary | ICD-10-CM | POA: Diagnosis not present

## 2018-09-01 DIAGNOSIS — I10 Essential (primary) hypertension: Secondary | ICD-10-CM | POA: Diagnosis not present

## 2018-09-01 DIAGNOSIS — J449 Chronic obstructive pulmonary disease, unspecified: Secondary | ICD-10-CM | POA: Diagnosis present

## 2018-09-01 DIAGNOSIS — M059 Rheumatoid arthritis with rheumatoid factor, unspecified: Secondary | ICD-10-CM | POA: Diagnosis not present

## 2018-09-01 DIAGNOSIS — J910 Malignant pleural effusion: Secondary | ICD-10-CM | POA: Diagnosis not present

## 2018-09-01 DIAGNOSIS — R600 Localized edema: Secondary | ICD-10-CM | POA: Diagnosis not present

## 2018-09-01 DIAGNOSIS — I491 Atrial premature depolarization: Secondary | ICD-10-CM | POA: Diagnosis not present

## 2018-09-01 DIAGNOSIS — Z66 Do not resuscitate: Secondary | ICD-10-CM | POA: Diagnosis not present

## 2018-09-01 DIAGNOSIS — J189 Pneumonia, unspecified organism: Secondary | ICD-10-CM | POA: Diagnosis not present

## 2018-09-01 DIAGNOSIS — C771 Secondary and unspecified malignant neoplasm of intrathoracic lymph nodes: Secondary | ICD-10-CM | POA: Diagnosis not present

## 2018-09-01 DIAGNOSIS — R000 Tachycardia, unspecified: Secondary | ICD-10-CM | POA: Diagnosis not present

## 2018-09-01 DIAGNOSIS — J939 Pneumothorax, unspecified: Secondary | ICD-10-CM

## 2018-09-01 DIAGNOSIS — Z9049 Acquired absence of other specified parts of digestive tract: Secondary | ICD-10-CM | POA: Diagnosis not present

## 2018-09-01 DIAGNOSIS — E785 Hyperlipidemia, unspecified: Secondary | ICD-10-CM | POA: Diagnosis present

## 2018-09-01 DIAGNOSIS — Z885 Allergy status to narcotic agent status: Secondary | ICD-10-CM

## 2018-09-01 DIAGNOSIS — E782 Mixed hyperlipidemia: Secondary | ICD-10-CM

## 2018-09-01 DIAGNOSIS — Z8349 Family history of other endocrine, nutritional and metabolic diseases: Secondary | ICD-10-CM

## 2018-09-01 DIAGNOSIS — F17210 Nicotine dependence, cigarettes, uncomplicated: Secondary | ICD-10-CM | POA: Diagnosis present

## 2018-09-01 DIAGNOSIS — J9611 Chronic respiratory failure with hypoxia: Secondary | ICD-10-CM | POA: Diagnosis present

## 2018-09-01 DIAGNOSIS — Z720 Tobacco use: Secondary | ICD-10-CM | POA: Diagnosis not present

## 2018-09-01 DIAGNOSIS — R918 Other nonspecific abnormal finding of lung field: Secondary | ICD-10-CM | POA: Diagnosis present

## 2018-09-01 DIAGNOSIS — R5381 Other malaise: Secondary | ICD-10-CM | POA: Diagnosis not present

## 2018-09-01 DIAGNOSIS — J441 Chronic obstructive pulmonary disease with (acute) exacerbation: Secondary | ICD-10-CM

## 2018-09-01 DIAGNOSIS — L930 Discoid lupus erythematosus: Secondary | ICD-10-CM | POA: Diagnosis present

## 2018-09-01 DIAGNOSIS — Z7982 Long term (current) use of aspirin: Secondary | ICD-10-CM

## 2018-09-01 DIAGNOSIS — I2510 Atherosclerotic heart disease of native coronary artery without angina pectoris: Secondary | ICD-10-CM | POA: Diagnosis not present

## 2018-09-01 DIAGNOSIS — Z888 Allergy status to other drugs, medicaments and biological substances status: Secondary | ICD-10-CM

## 2018-09-01 DIAGNOSIS — I361 Nonrheumatic tricuspid (valve) insufficiency: Secondary | ICD-10-CM | POA: Diagnosis not present

## 2018-09-01 DIAGNOSIS — R0602 Shortness of breath: Secondary | ICD-10-CM | POA: Diagnosis not present

## 2018-09-01 DIAGNOSIS — R062 Wheezing: Secondary | ICD-10-CM | POA: Diagnosis not present

## 2018-09-01 DIAGNOSIS — C3412 Malignant neoplasm of upper lobe, left bronchus or lung: Secondary | ICD-10-CM | POA: Diagnosis not present

## 2018-09-01 DIAGNOSIS — R0902 Hypoxemia: Secondary | ICD-10-CM | POA: Diagnosis not present

## 2018-09-01 DIAGNOSIS — Z4682 Encounter for fitting and adjustment of non-vascular catheter: Secondary | ICD-10-CM | POA: Diagnosis not present

## 2018-09-01 HISTORY — DX: Chronic obstructive pulmonary disease with (acute) exacerbation: J44.1

## 2018-09-01 LAB — CBC WITH DIFFERENTIAL/PLATELET
Abs Immature Granulocytes: 0.03 10*3/uL (ref 0.00–0.07)
Basophils Absolute: 0 10*3/uL (ref 0.0–0.1)
Basophils Relative: 0 %
Eosinophils Absolute: 0 10*3/uL (ref 0.0–0.5)
Eosinophils Relative: 0 %
HCT: 39.9 % (ref 36.0–46.0)
HEMOGLOBIN: 13.8 g/dL (ref 12.0–15.0)
Immature Granulocytes: 0 %
Lymphocytes Relative: 9 %
Lymphs Abs: 0.7 10*3/uL (ref 0.7–4.0)
MCH: 31.7 pg (ref 26.0–34.0)
MCHC: 34.6 g/dL (ref 30.0–36.0)
MCV: 91.7 fL (ref 80.0–100.0)
Monocytes Absolute: 0.4 10*3/uL (ref 0.1–1.0)
Monocytes Relative: 5 %
Neutro Abs: 6.9 10*3/uL (ref 1.7–7.7)
Neutrophils Relative %: 86 %
Platelets: 282 10*3/uL (ref 150–400)
RBC: 4.35 MIL/uL (ref 3.87–5.11)
RDW: 13.6 % (ref 11.5–15.5)
WBC: 8 10*3/uL (ref 4.0–10.5)
nRBC: 0 % (ref 0.0–0.2)

## 2018-09-01 LAB — BRAIN NATRIURETIC PEPTIDE: B Natriuretic Peptide: 266 pg/mL — ABNORMAL HIGH (ref 0.0–100.0)

## 2018-09-01 LAB — I-STAT VENOUS BLOOD GAS, ED
Acid-Base Excess: 2 mmol/L (ref 0.0–2.0)
Bicarbonate: 26 mmol/L (ref 20.0–28.0)
O2 Saturation: 89 %
Patient temperature: 98.7
TCO2: 27 mmol/L (ref 22–32)
pCO2, Ven: 37.4 mmHg — ABNORMAL LOW (ref 44.0–60.0)
pH, Ven: 7.451 — ABNORMAL HIGH (ref 7.250–7.430)
pO2, Ven: 54 mmHg — ABNORMAL HIGH (ref 32.0–45.0)

## 2018-09-01 LAB — BASIC METABOLIC PANEL
Anion gap: 12 (ref 5–15)
BUN: 14 mg/dL (ref 8–23)
CO2: 24 mmol/L (ref 22–32)
Calcium: 9.4 mg/dL (ref 8.9–10.3)
Chloride: 94 mmol/L — ABNORMAL LOW (ref 98–111)
Creatinine, Ser: 0.75 mg/dL (ref 0.44–1.00)
GFR calc Af Amer: >60 mL/min (ref >60)
GFR calc non Af Amer: >60 mL/min (ref >60)
Glucose, Bld: 158 mg/dL — ABNORMAL HIGH (ref 70–99)
Potassium: 3.5 mmol/L (ref 3.5–5.1)
Sodium: 130 mmol/L — ABNORMAL LOW (ref 135–145)

## 2018-09-01 LAB — TROPONIN I: Troponin I: <0.03 ng/mL (ref <0.03)

## 2018-09-01 LAB — I-STAT TROPONIN, ED: TROPONIN I, POC: 0.02 ng/mL (ref 0.00–0.08)

## 2018-09-01 LAB — OSMOLALITY: OSMOLALITY: 278 mosm/kg (ref 275–295)

## 2018-09-01 LAB — D-DIMER, QUANTITATIVE: D-Dimer, Quant: >20 ug/mL-FEU — ABNORMAL HIGH (ref 0.00–0.50)

## 2018-09-01 MED ORDER — IOPAMIDOL (ISOVUE-370) INJECTION 76%
100.0000 mL | Freq: Once | INTRAVENOUS | Status: AC | PRN
  Administered 2018-09-01: 100 mL via INTRAVENOUS

## 2018-09-01 MED ORDER — SODIUM CHLORIDE 0.9 % IV SOLN
1.0000 g | Freq: Three times a day (TID) | INTRAVENOUS | Status: DC
  Administered 2018-09-02 – 2018-09-04 (×8): 1 g via INTRAVENOUS
  Filled 2018-09-01 (×9): qty 1

## 2018-09-01 MED ORDER — GABAPENTIN 100 MG PO CAPS
100.0000 mg | ORAL_CAPSULE | Freq: Three times a day (TID) | ORAL | Status: DC
  Administered 2018-09-02 – 2018-09-06 (×14): 100 mg via ORAL
  Filled 2018-09-01 (×14): qty 1

## 2018-09-01 MED ORDER — LABETALOL HCL 5 MG/ML IV SOLN: 10.0000 mg | Freq: Once | INTRAVENOUS | Status: DC

## 2018-09-01 MED ORDER — ONDANSETRON HCL 4 MG/2ML IJ SOLN
4.0000 mg | Freq: Four times a day (QID) | INTRAMUSCULAR | Status: DC | PRN
  Administered 2018-09-03: 4 mg via INTRAVENOUS
  Filled 2018-09-01: qty 2

## 2018-09-01 MED ORDER — ALPRAZOLAM 0.5 MG PO TABS
0.7500 mg | ORAL_TABLET | Freq: Every day | ORAL | Status: DC
  Administered 2018-09-02 – 2018-09-05 (×5): 0.75 mg via ORAL
  Filled 2018-09-01 (×5): qty 1

## 2018-09-01 MED ORDER — IPRATROPIUM-ALBUTEROL 0.5-2.5 (3) MG/3ML IN SOLN
3.0000 mL | Freq: Four times a day (QID) | RESPIRATORY_TRACT | Status: DC
  Administered 2018-09-02 (×2): 3 mL via RESPIRATORY_TRACT
  Filled 2018-09-01 (×2): qty 3

## 2018-09-01 MED ORDER — LEVOFLOXACIN 750 MG PO TABS
750.0000 mg | ORAL_TABLET | Freq: Once | ORAL | Status: AC
  Administered 2018-09-01: 750 mg via ORAL
  Filled 2018-09-01: qty 1

## 2018-09-01 MED ORDER — IPRATROPIUM BROMIDE 0.02 % IN SOLN
0.5000 mg | Freq: Once | RESPIRATORY_TRACT | Status: AC
  Administered 2018-09-01: 0.5 mg via RESPIRATORY_TRACT
  Filled 2018-09-01: qty 2.5

## 2018-09-01 MED ORDER — IOPAMIDOL (ISOVUE-370) INJECTION 76%
INTRAVENOUS | Status: AC
  Filled 2018-09-01: qty 100

## 2018-09-01 MED ORDER — PREDNISONE 20 MG PO TABS
40.0000 mg | ORAL_TABLET | Freq: Every day | ORAL | Status: DC
  Administered 2018-09-02: 40 mg via ORAL
  Filled 2018-09-01 (×2): qty 2

## 2018-09-01 MED ORDER — LEVALBUTEROL HCL 0.63 MG/3ML IN NEBU
0.6300 mg | INHALATION_SOLUTION | Freq: Four times a day (QID) | RESPIRATORY_TRACT | Status: DC | PRN
  Administered 2018-09-03 – 2018-09-06 (×3): 0.63 mg via RESPIRATORY_TRACT
  Filled 2018-09-01 (×3): qty 3

## 2018-09-01 MED ORDER — ATORVASTATIN CALCIUM 40 MG PO TABS
40.0000 mg | ORAL_TABLET | Freq: Every evening | ORAL | Status: DC
  Administered 2018-09-02 – 2018-09-05 (×4): 40 mg via ORAL
  Filled 2018-09-01 (×4): qty 1

## 2018-09-01 MED ORDER — ACETAMINOPHEN 650 MG RE SUPP: 650.0000 mg | Freq: Four times a day (QID) | RECTAL | Status: DC | PRN

## 2018-09-01 MED ORDER — LABETALOL HCL 5 MG/ML IV SOLN
5.0000 mg | Freq: Once | INTRAVENOUS | Status: AC
  Administered 2018-09-01: 5 mg via INTRAVENOUS
  Filled 2018-09-01: qty 4

## 2018-09-01 MED ORDER — SODIUM CHLORIDE 0.9 % IV SOLN
INTRAVENOUS | Status: AC
  Administered 2018-09-01: via INTRAVENOUS

## 2018-09-01 MED ORDER — ONDANSETRON HCL 4 MG PO TABS: 4.0000 mg | ORAL_TABLET | Freq: Four times a day (QID) | ORAL | Status: DC | PRN

## 2018-09-01 MED ORDER — ACETAMINOPHEN 325 MG PO TABS
650.0000 mg | ORAL_TABLET | Freq: Four times a day (QID) | ORAL | Status: DC | PRN
  Administered 2018-09-02: 650 mg via ORAL
  Filled 2018-09-01: qty 2

## 2018-09-01 MED ORDER — LORATADINE 10 MG PO TABS
10.0000 mg | ORAL_TABLET | Freq: Every day | ORAL | Status: DC
  Administered 2018-09-02 – 2018-09-06 (×5): 10 mg via ORAL
  Filled 2018-09-01 (×5): qty 1

## 2018-09-01 MED ORDER — HYDROCODONE-ACETAMINOPHEN 5-325 MG PO TABS
1.0000 | ORAL_TABLET | ORAL | Status: DC | PRN
  Administered 2018-09-02 – 2018-09-06 (×19): 2 via ORAL
  Administered 2018-09-06: 1 via ORAL
  Filled 2018-09-01: qty 1
  Filled 2018-09-01 (×19): qty 2

## 2018-09-01 MED ORDER — FAMOTIDINE 20 MG PO TABS
10.0000 mg | ORAL_TABLET | Freq: Every day | ORAL | Status: DC
  Administered 2018-09-02 – 2018-09-06 (×5): 10 mg via ORAL
  Filled 2018-09-01 (×5): qty 1

## 2018-09-01 MED ORDER — ALBUTEROL (5 MG/ML) CONTINUOUS INHALATION SOLN
10.0000 mg/h | INHALATION_SOLUTION | Freq: Once | RESPIRATORY_TRACT | Status: AC
  Administered 2018-09-01: 10 mg/h via RESPIRATORY_TRACT

## 2018-09-01 MED ORDER — ALBUTEROL (5 MG/ML) CONTINUOUS INHALATION SOLN
5.0000 mg/h | INHALATION_SOLUTION | Freq: Once | RESPIRATORY_TRACT | Status: DC
  Filled 2018-09-01 (×2): qty 20

## 2018-09-01 MED ORDER — METHYLPREDNISOLONE SODIUM SUCC 125 MG IJ SOLR
60.0000 mg | INTRAMUSCULAR | Status: AC
  Administered 2018-09-02: 60 mg via INTRAVENOUS
  Filled 2018-09-01: qty 2

## 2018-09-01 NOTE — Progress Notes (Signed)
Patient admitted from ED alert and oriented. Not in distress. Vitals stable. Bp slightly elevated.Oriented to room and surroundings. Telemetry called and verified. Skin assessment done times two nurses. Call bell within reach.

## 2018-09-01 NOTE — ED Notes (Signed)
Respiratory notified of need for continuous neb

## 2018-09-01 NOTE — H&P (Addendum)
Donna Mcneil:[id number] DOB: 04-Sep-1940 DOA: 09/01/2018     PCP: [name], PA [facility] Outpatient Specialists:    Rheum Dr Jefm Bryant Vascular surgery Lucky Cowboy, Erskine Squibb, MD Patient arrived to ER on 09/01/18 at 1743  Patient coming from: home Lives alone,       Chief Complaint:  Chief Complaint  Patient presents with  . COPD  . URI    HPI: Donna Mcneil is a 78 y.o. 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  Reports cold-like symptoms for the past 1 week. Have been short of breath since Thanksgiving  EMS was called patient was noted to have new PVCs on EKG.  EMS administered 125 mg of Solu-Medrol 2 mg of magnesium 5 mg albuterol and DuoNeb On arrival BP 180/90 heart rate 110  Unable to sleep lying flat has been having shortness of breath cough productive of yellow sputum no associated fevers or chills No associated chest pain or palpitations  Patient has been seeing her rheumatologist 5 days ago and reported above-mentioned symptoms she was given a Z-Pak and 6-day prednisone taper but did not seem to improved  She is trying to quit smoking interested in quitting  Lost 5 lb over past few weeks 40 years ago used to live in a house with Asbestos   Regarding pertinent Chronic problems:  History of COPD continues to smoke not currently on oxygen at baseline hyperlipidemia on Lipitor Hx of RA on plaquenil Off Enbrel because of subacute cutaneous lupus Recently rheumatology  increased methotrexate from 6-8  While in ER: Requiring continuous nebs while in the emergency department Dose of Levaquin p.o. Albuterol nebulizer and Atrovent nebulizer  The following Work up has been ordered so far:  Orders Placed This Encounter  Procedures    . Blood culture (routine x 2)  . DG Chest Port 1 View  . Basic metabolic panel  . CBC with Differential  . Brain natriuretic peptide  . Cardiac monitoring  . Consult to hospitalist  . Pulse oximetry (single)  . I-stat troponin, ED  . EKG 12-Lead  . ED EKG  . Insert peripheral IV     Following Medications were ordered in ER: Medications  levofloxacin (LEVAQUIN) tablet 750 mg (has no administration in time range)  albuterol (PROVENTIL,VENTOLIN) solution continuous neb (10 mg/hr Nebulization Given 09/01/18 1822)  ipratropium (ATROVENT) nebulizer solution 0.5 mg (0.5 mg Nebulization Given 09/01/18 1824)    Significant initial  Findings: Abnormal Labs Reviewed  BASIC METABOLIC PANEL - Abnormal; Notable for the following components:      Result Value   Sodium 130 (*)    Chloride 94 (*)    Glucose, Bld 158 (*)    All other components within normal limits  BRAIN NATRIURETIC PEPTIDE - Abnormal; Notable for the following components:   B Natriuretic Peptide 266.0 (*)    All other components within normal limits     Lactic Acid, Venous No results found for: LATICACIDVEN  Na 130 K 3.5 BNP 266  Cr    stable,   Lab Results  Component Value Date   CREATININE 0.75 09/01/2018   CREATININE 0.76 05/14/2018   CREATININE 0.65 01/09/2018     Trop 0.02 WBC  8.0  HG/HCT stable,     Component Value Date/Time   HGB 13.8 09/01/2018  1816   HGB 15.1 05/14/2018 1225   HCT 39.9 09/01/2018 1816   HCT 41.0 05/14/2018 1225     Troponin (Point of Care Test) Recent Labs    09/01/18 1825  TROPIPOC 0.02     BNP (last 3 results) Recent Labs    09/01/18 1818  BNP 266.0*    ProBNP (last 3 results) No results for input(s): PROBNP in the last 8760 hours.     UA  not ordered    CXR - Moderate left pleural effusion associated with left lung base atelectasis or pneumonia.     ECG:  Personally reviewed by me showing: HR : 98 Rhythm:  NSR,     no evidence of ischemic changes  frequent PVCs multifocal QTC 482     ED Triage Vitals  Enc Vitals Group     BP --      Pulse --      Resp --      Temp --      Temp src --      SpO2 09/01/18 1826 98 %     Weight 09/01/18 1754 145 lb (65.8 kg)     Height 09/01/18 1754 5\' 2"  (1.575 m)     Head Circumference --      Peak Flow --      Pain Score 09/01/18 1754 0     Pain Loc --      Pain Edu? --      Excl. in [place]? --   TMAX(24)@       Latest  Height 5\' 2"  (1.575 m), weight 65.8 kg, SpO2 98 %.   Hospitalist was called for admission for  Pneumonia, pleural effusion   Review of Systems:    Pertinent positives include:  chills, fatigue, weight loss shortness of breath at rest.   dyspnea on exertion  productive cough, wheezing. Constitutional:  No weight loss, night sweats, Fevers,  HEENT:  No headaches, Difficulty swallowing,Tooth/dental problems,Sore throat,  No sneezing, itching, ear ache, nasal congestion, post nasal drip,  Cardio-vascular:  No chest pain, Orthopnea, PND, anasarca, dizziness, palpitations.no Bilateral lower extremity swelling  GI:  No heartburn, indigestion, abdominal pain, nausea, vomiting, diarrhea, change in bowel habits, loss of appetite, melena, blood in stool, hematemesis Resp:    No excess mucus, No non-productive cough, No coughing up of blood.No change in color of mucus. No  Skin:  no rash or lesions. No jaundice GU:  no dysuria, change in color of urine, no urgency or frequency. No straining to urinate.  No flank pain.  Musculoskeletal:  No joint pain or no joint swelling. No decreased range of motion. No back pain.  Psych:  No change in mood or affect. No depression or anxiety. No memory loss.  Neuro: no localizing neurological complaints, no tingling, no weakness, no double vision, no gait abnormality, no slurred speech, no confusion  All systems reviewed and apart from [place] all are negative  Past Medical History:   Past Medical History:  Diagnosis Date  .  Diverticulitis   . Hypercholesteremia   . Hypertension   . Lupus ([place])   . Osteoporosis   . Tobacco use       Past Surgical History:  Procedure Laterality Date  . ABDOMINAL HYSTERECTOMY  12/1969  . APPENDECTOMY  1957  . CATARACT EXTRACTION    . CHOLECYSTECTOMY  1997  . LAPAROSCOPIC SIGMOID COLECTOMY  2013   secondary to diverticulitis  . spit seed removal    . TONSILLECTOMY  1945    Social History:  Ambulatory  , walker    reports that she has been smoking cigarettes. She has been smoking about 0.50 packs per day. She has never used smokeless tobacco. She reports that she does not drink alcohol or use drugs.     Family History:   Family History  Problem Relation Age of Onset  . Alcohol abuse Mother   . Cirrhosis Mother   . Heart disease Father   . Hypertension Father   . Gout Father   . Ulcers Father   . Hyperlipidemia Father   . Alcohol abuse Brother   . Heart disease Brother        MI at age 66  . OCD Son   . Prostatitis Paternal Grandfather   . Pneumonia Paternal Grandfather   . Cancer Son     Allergies: Allergies  Allergen Reactions  . Codeine Other (See Comments)    GI upset  . Cyclosporine Other (See Comments)    Burning eyes  . Other     Other reaction(s): OTHER  . Pregabalin Other (See Comments)    Personality changes-becomes angry  . Zolpidem     Other reaction(s): ITCHING     Prior to Admission medications   Medication Sig Start Date End Date Taking? Authorizing Provider  acetaminophen (TYLENOL) 500 MG tablet Take 1,000 mg by mouth every 6 (six) hours as needed.    [provider]  albuterol (PROVENTIL) (2.5 MG/3ML) 0.083% nebulizer solution USE 1 VIAL IN NEBULIZER AS DIRECTED EVERY 4 HOURS AS NEEDED 07/23/18   [name], [name], PA  ALPRAZolam (XANAX) 0.5 MG tablet TAKE 1 TABLET BY MOUTH TWICE A DAY AS NEEDED 08/20/18   [name], [name], PA  aspirin 81 MG tablet Take 81 mg by mouth daily.    [provider]    atorvastatin (LIPITOR) 40 MG tablet TAKE 1 TABLET BY MOUTH ONCE DAILY 05/14/18   [name], [name], PA  betamethasone dipropionate (DIPROLENE) 0.05 % cream Apply 8.52 application topically 2 (two) times daily.     [provider]  BIOTIN PO Take 5,000 mcg by mouth daily.     [provider]  Calcium Carbonate-Vitamin D (CALCIUM 600+D) 600-200 MG-UNIT TABS Take 1 tablet by mouth 2 (two) times daily.     [provider]  Carboxymethylcellul-Glycerin (OPTIVE) 0.5-0.9 % SOLN Apply to eye.    [provider]  carboxymethylcellulose (REFRESH TEARS) 0.5 % SOLN 1 drop 3 (three) times daily as needed.    [provider]  cetirizine (ZYRTEC) 10 MG tablet Take 10 mg by mouth daily.    [provider]  clobetasol (TEMOVATE) 0.05 % external solution Apply 1 application topically as needed.    [provider]  clobetasol ointment (TEMOVATE) 0.05 % Apply 7.78 application topically 2 (two) times daily. 09/27/17   [provider]  Coenzyme Q10 (COQ10) 100 MG CAPS Take 100 mg by mouth daily.    [provider]  docusate sodium (COLACE) 100 MG capsule Take 100 mg by mouth 2 (two) times daily.    [provider]  folic acid (FOLVITE) 242 MCG tablet Take 1,000 mcg by mouth daily.     [provider]  gabapentin (NEURONTIN) 100 MG capsule Take 100 mg by mouth 3 (three) times daily.  09/08/15   [provider]  hydrochlorothiazide (HYDRODIURIL) 50 MG tablet TAKE 1 TABLET BY MOUTH ONCE A DAY 10/20/17   [name], [name], PA  hydroxychloroquine (PLAQUENIL)  200 MG tablet Take 200 mg by mouth daily.  11/07/17 11/07/18  [provider]  KRILL OIL PO Take 500 mg by mouth daily.     [provider]  methotrexate (RHEUMATREX) 2.5 MG tablet Take 20 mg by mouth once a week.  10/01/15   [provider]  Multiple Vitamins-Minerals (MULTIVITAMIN ADULT PO) Take by mouth daily.     [provider]   Omega 3 1000 MG CAPS Take 3 capsules by mouth daily.    [provider]  Potassium 99 MG TABS Take by mouth daily.    [provider]  PROAIR HFA 108 [phone] Base) MCG/ACT inhaler INHALE 2 PUFFS EVERY 6 HOURS AS NEEDED 05/24/18   [name], [name], PA  ranitidine (ZANTAC) 150 MG tablet Take 150 mg by mouth 2 (two) times daily.    [provider]  Vitamin D, Cholecalciferol, 1000 units TABS Take by mouth daily.     [provider]   Physical Exam: Height 5\' 2"  (1.575 m), weight 65.8 kg, SpO2 98 %. 1. General:  in   Acute distress, increased work of breathing   Chronically ill  -appearing 2. Psychological: Alert and   Oriented 3. Head/ENT:   Dry Mucous Membranes                          Head Non traumatic, neck supple                           Poor Dentition 4. SKIN:   decreased Skin turgor,  Skin clean Dry and intact no rash 5. Heart: Regular rate and rhythm no Murmur, no Rub or gallop 6. Lungs crackles diminished on the left   7. Abdomen: Soft,  non-tender, Non distended bowel sounds present 8. Lower extremities: no clubbing, cyanosis, or rIGHT >LEFT edema 9. Neurologically Grossly intact, moving all 4 extremities equally  10. MSK: Normal range of motion   LABS:     Recent Labs  Lab 09/01/18 1816  WBC 8.0  NEUTROABS 6.9  HGB 13.8  HCT 39.9  MCV 91.7  PLT 970   Basic Metabolic Panel: Recent Labs  Lab 09/01/18 1816  NA 130*  K 3.5  CL 94*  CO2 24  GLUCOSE 158*  BUN 14  CREATININE 0.75  CALCIUM 9.4      No results for input(s): AST, ALT, ALKPHOS, BILITOT, PROT, ALBUMIN in the last 168 hours. No results for input(s): LIPASE, AMYLASE in the last 168 hours. No results for input(s): AMMONIA in the last 168 hours.    HbA1C: No results for input(s): HGBA1C in the last 72 hours. CBG: No results for input(s): GLUCAP in the last 168 hours.    Urine analysis:    Component Value Date/Time   COLORURINE Red 02/17/2012 1142    APPEARANCEUR Cloudy 02/17/2012 1142   LABSPEC 1.028 02/17/2012 1142   PHURINE 5.0 02/17/2012 1142   GLUCOSEU Negative 02/17/2012 1142   HGBUR Negative 02/17/2012 1142   BILIRUBINUR Negative 02/17/2012 1142   KETONESUR Negative 02/17/2012 1142   PROTEINUR Negative 02/17/2012 1142   NITRITE Positive 02/17/2012 1142   LEUKOCYTESUR Negative 02/17/2012 1142       Cultures: No results found for: SDES, SPECREQUEST, CULT, REPTSTATUS   Radiological Exams on Admission: Ct Angio Chest Pe W/cm &/or Wo Cm  Result Date: 09/01/2018 CLINICAL DATA:  Shortness of breath. EXAM: CT ANGIOGRAPHY CHEST WITH CONTRAST TECHNIQUE:  Multidetector CT imaging of the chest was performed using the standard protocol during bolus administration of intravenous contrast. Multiplanar CT image reconstructions and MIPs were obtained to evaluate the vascular anatomy. CONTRAST:  52 mL ISOVUE-370 IOPAMIDOL (ISOVUE-370) INJECTION 76% COMPARISON:  Chest radiograph 09/01/2018 FINDINGS: Cardiovascular: There are no filling defects within the pulmonary arteries to suggest pulmonary embolus. Diffuse atherosclerosis of the thoracic aorta without aneurysm or dissection. Conventional branching pattern from the aortic arch. There are coronary artery calcifications. Heart is normal in size. Small pericardial effusion measures up to 10 mm in depth. Mediastinum/Nodes: Prominent left hilar lymph nodes largest measuring up to 13 mm, image 65 series 5. Prominent subcarinal node measures 12 mm short axis. Small high mediastinal nodes are not enlarged by size criteria. No enlarged right hilar lymph nodes. No axillary adenopathy. Esophagus is decompressed. No visualized thyroid nodule. Lungs/Pleura: Moderate size left pleural effusion is partially loculated. Associated compressive atelectasis in the left lower and to a lesser extent upper lobes. Suspect spiculated 19 x 16 mm anterior left upper lobe nodule image 68 series 6, abutting the pleural surface.  Fissural nodularity extends throughout the left interlobar fissure with possible tracking pleural fluid. Dominant fissural nodule measures 9 mm image 35 series 6. Moderate emphysema. There is narrowing of the left upper and lower lobe bronchi with mucous plugging in the lingula. Additional areas of mucous plugging in the right lower lobe. Right middle lobe pleural based nodule measures 4 mm, image 85 series 6. 5 mm right apical nodule, image 19. Additional tiny subpleural nodules in the right lung. Upper Abdomen: Motion artifact through the abdomen. Left adrenal thickening without dominant nodule. Atherosclerotic plaque at the diaphragmatic hiatus causes luminal narrowing of the aorta of greater than 50%. Musculoskeletal: No blastic or destructive lytic lesions. Review of the MIP images confirms the above findings. IMPRESSION: 1. Suspect spiculated 19 x 16 mm anterior left upper lobe nodule, suspicious for primary bronchogenic malignancy. Fissural nodularity extends throughout the left interlobar fissure. Enlarged left hilar lymph node and prominent subcarinal node. Consider multi disciplinary pulmonary referral for further workup. 2. Moderate size partially loculated left pleural effusion with compressive atelectasis in the left lower and to a lesser extent upper lobes. 3. Small nodules in the right lung, including a 5 mm right apical nodule that is slightly irregular, nonspecific. 4. No pulmonary embolus. 5. Moderate emphysema. Areas of mucous plugging involves the left upper lobe and right lower lobes. 6. Advanced aortic atherosclerosis. Calcified plaque at the diaphragmatic hiatus causes luminal narrowing of greater than 50%. Aortic Atherosclerosis (ICD10-I70.0) and Emphysema (ICD10-J43.9). Electronically Signed   By: Keith Rake M.D.   On: 09/01/2018 22:40   Dg Chest Port 1 View  Result Date: 09/01/2018 CLINICAL DATA:  Short of breath. EXAM: PORTABLE CHEST 1 VIEW COMPARISON:  None. FINDINGS: Cardiac  silhouette is partly obscured by left lung base opacity. Is grossly normal in size. No mediastinal or hilar masses. Opacity at the left lung base is consistent with a moderate pleural effusion with associated atelectasis or pneumonia. Lungs show prominent bronchovascular markings and mild lower lung zone interstitial thickening, presumed chronic. Mild scarring noted at the apices. No convincing right pleural effusion.  No pneumothorax. Skeletal structures are grossly intact. IMPRESSION: 1. Moderate left pleural effusion associated with left lung base atelectasis or pneumonia. 2. No other evidence of acute cardiopulmonary disease. Electronically Signed   By: Lajean Manes M.D.   On: 09/01/2018 18:49    Chart has been reviewed   Assessment/Plan  78 y.o. 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 Acute respiratory failure (HCC) with hypoxia - multifactorial currently on 2L sating 96% increased respiratory rate and increased work of breathing.  Attempted to use BiPAP patient could not tolerate No evidence of PE on chest CT Currently a bit more comfortable setting now.  Will need diagnostic thoracentesis followed by therapeutic.  May need CT surgery consult given loculated effusions . COPD exacerbation ([place]) -  Will initiate: Steroid taper  -  Antibiotics broad-spectrum antibiotics given possibility of underlying infection - XopenexPRN, - scheduled duoneb,  -  Breo or Dulera at discharge   -  Mucinex.  Titrate O2 to saturation >90%. Follow patients respiratory status.  Order respiratory panel and influenza PCR - PCCM consulted for e-link monitoring,  -  BiPAP ordered PRN patient could not tolerate  currently mentating well no evidence of symptomatic  hypercarbia VBG 7.451/37.4  . Seropositive rheumatoid arthritis ([place]) -multiple sites with positive rheumatoid factor hold immunosuppressive medications for now . Hyperlipidemia - stable  . Hypertension - stable, cont home emdications . Tobacco use -  - Spoke about importance of quitting spent 5 minutes discussing options for treatment, prior attempts at quitting, and dangers of smoking  -At this point patient is     interested in quitting  - order nicotine patch   - nursing tobacco cessation protocol\    . Hyponatremia - order urine electrolytes, high risk for SIADH given pulmonary abnormalities . Pleural effusion - Pulmonary consult will likely need CT surgery consult in the morning given the loculated effusion.  Consulted IR to see if diagnostic tap could be obtained  Pulmonary nodules will need further investigation given extensive history of tobacco abuse high possibility for malignancy Other plan as per orders.   Leg edema left worse than right elevated d-dimer will check Dopplers bilaterally DVT prophylaxis:  SCD   Code Status: DNR/DNI  as per patient   I had personally discussed CODE STATUS with patient   Family Communication:   Family not  at  Bedside    Disposition Plan:      To home once workup is complete and patient is stable                      Would benefit from PT/OT eval prior to DC  Ordered                    Nutrition    consulted                                      Consults called:    PCCM  Admission status:    inpatient     Expect 2 midnight stay secondary to severity of patient's current illness including   hemodynamic instability despite optimal treatment (tachycardia   hypoxia,  )  Severe lab/radiological abnormalities including:   Underlying pleural effusion versus pneumonia versus spiculated pulmonary nodules and extensive comorbidities including:  RA  COPD      That are currently affecting medical management.   I expect  patient to be  hospitalized for 2 midnights requiring inpatient medical care.  Patient is at high risk for adverse  outcome (such as loss of life or disability) if not treated.  Indication for inpatient stay as follows:  Hemodynamic instability despite maximal medical therapy,        Need for operative/procedural  intervention New or worsening hypoxia  Need for IV antibiotics, IV fluids,      Level of care    SDU tele indefinitely please discontinue once patient no longer qualifies     Miasha Emmons 09/01/2018, 10:28 PM    [facility]  Pager [phone]   after 2 AM please page floor coverage PA If 7AM-7PM, please contact the day team taking care of the patient  [email or web address]  Password [email or web address]

## 2018-09-01 NOTE — ED Notes (Signed)
ED TO INPATIENT HANDOFF REPORT  Name/Age/Gender Donna Mcneil 78 y.o. female  Code Status   Home/SNF/Other Home  Chief Complaint COPD,PVC's  Level of Care/Admitting Diagnosis ED Disposition    ED Disposition Condition [facility] [[id number]]  Level of Care: Progressive [102]  Diagnosis: COPD exacerbation [facility]) [[id number]]  Admitting Physician: Toy Baker [[id number]]  Attending Physician: Toy Baker [[id number]]  Estimated length of stay: 3 - 4 days  Certification:: I certify this patient will need inpatient services for at least 2 midnights  PT Class (Do Not Modify): Inpatient [101]  PT Acc Code (Do Not Modify): Private [1]       Medical History Past Medical History:  Diagnosis Date  . Diverticulitis   . Hypercholesteremia   . Hypertension   . Lupus ([place])   . Osteoporosis   . Tobacco use     Allergies Allergies  Allergen Reactions  . Codeine Other (See Comments)    GI upset  . Cyclosporine Other (See Comments)    Burning eyes  . Other     Other reaction(s): OTHER  . Pregabalin Other (See Comments)    Personality changes-becomes angry  . Zolpidem     Other reaction(s): ITCHING    IV Location/Drains/Wounds Patient Lines/Drains/Airways Status   Active Line/Drains/Airways    Name:   Placement date:   Placement time:   Site:   Days:   Peripheral IV 09/01/18 Left Hand   09/01/18    1806    Hand   less than 1   Peripheral IV 09/01/18 Right Antecubital   09/01/18    2140    Antecubital   less than 1          Labs/Imaging Results for orders placed or performed during the hospital encounter of 09/01/18 (from the past 48 hour(s))  Basic metabolic panel     Status: Abnormal   Collection Time: 09/01/18  6:16 PM  Result Value Ref Range   Sodium 130 (L) 135 - 145 mmol/L   Potassium 3.5 3.5 - 5.1 mmol/L   Chloride 94 (L) 98 - 111 mmol/L   CO2 24 22 - 32 mmol/L   Glucose, Bld 158 (H) 70 - 99 mg/dL    BUN 14 8 - 23 mg/dL   Creatinine, Ser 0.75 0.44 - 1.00 mg/dL   Calcium 9.4 8.9 - 10.3 mg/dL   GFR calc non Af Amer >60 >60 mL/min   GFR calc Af Amer >60 >60 mL/min   Anion gap 12 5 - 15    Comment: Performed at [facility], [place] [address], [place], [place] [address]  CBC with Differential     Status: None   Collection Time: 09/01/18  6:16 PM  Result Value Ref Range   WBC 8.0 4.0 - 10.5 K/uL   RBC 4.35 3.87 - 5.11 MIL/uL   Hemoglobin 13.8 12.0 - 15.0 g/dL   HCT 39.9 36.0 - 46.0 %   MCV 91.7 80.0 - 100.0 fL   MCH 31.7 26.0 - 34.0 pg   MCHC 34.6 30.0 - 36.0 g/dL   RDW 13.6 11.5 - 15.5 %   Platelets 282 150 - 400 K/uL   nRBC 0.0 0.0 - 0.2 %   Neutrophils Relative % 86 %   Neutro Abs 6.9 1.7 - 7.7 K/uL   Lymphocytes Relative 9 %   Lymphs Abs 0.7 0.7 - 4.0 K/uL   Monocytes Relative 5 %   Monocytes Absolute  0.4 0.1 - 1.0 K/uL   Eosinophils Relative 0 %   Eosinophils Absolute 0.0 0.0 - 0.5 K/uL   Basophils Relative 0 %   Basophils Absolute 0.0 0.0 - 0.1 K/uL   Immature Granulocytes 0 %   Abs Immature Granulocytes 0.03 0.00 - 0.07 K/uL    Comment: Performed at [facility], [place] [address], [place], [place] [address]  Brain natriuretic peptide     Status: Abnormal   Collection Time: 09/01/18  6:18 PM  Result Value Ref Range   B Natriuretic Peptide 266.0 (H) 0.0 - 100.0 pg/mL    Comment: Performed at [facility] [address], [place], [place] [address]  I-stat troponin, ED     Status: None   Collection Time: 09/01/18  6:25 PM  Result Value Ref Range   Troponin i, poc 0.02 0.00 - 0.08 ng/mL   Comment 3            Comment: Due to the release kinetics of cTnI, a negative result within the first hours of the onset of symptoms does not rule out myocardial infarction with certainty. If myocardial infarction is still suspected, repeat the test at appropriate intervals.   D-dimer, quantitative (not at [facility])     Status: Abnormal   Collection Time: 09/01/18  8:19  PM  Result Value Ref Range   D-Dimer, Quant >20.00 (H) 0.00 - 0.50 ug/mL-FEU    Comment: (NOTE) At the manufacturer cut-off of 0.50 ug/mL FEU, this assay has been documented to exclude PE with a sensitivity and negative predictive value of 97 to 99%.  At this time, this assay has not been approved by the FDA to exclude DVT/VTE. Results should be correlated with clinical presentation. Performed at [facility], [place] [address], [place], [place] [address]   Troponin I - Now Then Q6H     Status: None   Collection Time: 09/01/18  8:19 PM  Result Value Ref Range   Troponin I <0.03 <0.03 ng/mL    Comment: Performed at [facility] [address] [place], [place] [address]  Osmolality     Status: None   Collection Time: 09/01/18  8:19 PM  Result Value Ref Range   Osmolality 278 275 - 295 mOsm/kg    Comment: Performed at [facility] [address] [place], [place] [address]  I-Stat venous blood gas, ED     Status: Abnormal   Collection Time: 09/01/18  8:46 PM  Result Value Ref Range   pH, Ven 7.451 (H) 7.250 - 7.430   pCO2, Ven 37.4 (L) 44.0 - 60.0 mmHg   pO2, Ven 54.0 (H) 32.0 - 45.0 mmHg   Bicarbonate 26.0 20.0 - 28.0 mmol/L   TCO2 27 22 - 32 mmol/L   O2 Saturation 89.0 %   Acid-Base Excess 2.0 0.0 - 2.0 mmol/L   Patient temperature 98.7 F    Sample type VENOUS    Ct Angio Chest Pe W/cm &/or Wo Cm  Result Date: 09/01/2018 CLINICAL DATA:  Shortness of breath. EXAM: CT ANGIOGRAPHY CHEST WITH CONTRAST TECHNIQUE: Multidetector CT imaging of the chest was performed using the standard protocol during bolus administration of intravenous contrast. Multiplanar CT image reconstructions and MIPs were obtained to evaluate the vascular anatomy. CONTRAST:  52 mL ISOVUE-370 IOPAMIDOL (ISOVUE-370) INJECTION 76% COMPARISON:  Chest radiograph 09/01/2018 FINDINGS: Cardiovascular: There are no filling defects within the pulmonary arteries to suggest pulmonary embolus. Diffuse  atherosclerosis of the thoracic  aorta without aneurysm or dissection. Conventional branching pattern from the aortic arch. There are coronary artery calcifications. Heart is normal in size. Small pericardial effusion measures up to 10 mm in depth. Mediastinum/Nodes: Prominent left hilar lymph nodes largest measuring up to 13 mm, image 65 series 5. Prominent subcarinal node measures 12 mm short axis. Small high mediastinal nodes are not enlarged by size criteria. No enlarged right hilar lymph nodes. No axillary adenopathy. Esophagus is decompressed. No visualized thyroid nodule. Lungs/Pleura: Moderate size left pleural effusion is partially loculated. Associated compressive atelectasis in the left lower and to a lesser extent upper lobes. Suspect spiculated 19 x 16 mm anterior left upper lobe nodule image 68 series 6, abutting the pleural surface. Fissural nodularity extends throughout the left interlobar fissure with possible tracking pleural fluid. Dominant fissural nodule measures 9 mm image 35 series 6. Moderate emphysema. There is narrowing of the left upper and lower lobe bronchi with mucous plugging in the lingula. Additional areas of mucous plugging in the right lower lobe. Right middle lobe pleural based nodule measures 4 mm, image 85 series 6. 5 mm right apical nodule, image 19. Additional tiny subpleural nodules in the right lung. Upper Abdomen: Motion artifact through the abdomen. Left adrenal thickening without dominant nodule. Atherosclerotic plaque at the diaphragmatic hiatus causes luminal narrowing of the aorta of greater than 50%. Musculoskeletal: No blastic or destructive lytic lesions. Review of the MIP images confirms the above findings. IMPRESSION: 1. Suspect spiculated 19 x 16 mm anterior left upper lobe nodule, suspicious for primary bronchogenic malignancy. Fissural nodularity extends throughout the left interlobar fissure. Enlarged left hilar lymph node and prominent subcarinal node.  Consider multi disciplinary pulmonary referral for further workup. 2. Moderate size partially loculated left pleural effusion with compressive atelectasis in the left lower and to a lesser extent upper lobes. 3. Small nodules in the right lung, including a 5 mm right apical nodule that is slightly irregular, nonspecific. 4. No pulmonary embolus. 5. Moderate emphysema. Areas of mucous plugging involves the left upper lobe and right lower lobes. 6. Advanced aortic atherosclerosis. Calcified plaque at the diaphragmatic hiatus causes luminal narrowing of greater than 50%. Aortic Atherosclerosis (ICD10-I70.0) and Emphysema (ICD10-J43.9). Electronically Signed   By: Keith Rake M.D.   On: 09/01/2018 22:40   Dg Chest Port 1 View  Result Date: 09/01/2018 CLINICAL DATA:  Short of breath. EXAM: PORTABLE CHEST 1 VIEW COMPARISON:  None. FINDINGS: Cardiac silhouette is partly obscured by left lung base opacity. Is grossly normal in size. No mediastinal or hilar masses. Opacity at the left lung base is consistent with a moderate pleural effusion with associated atelectasis or pneumonia. Lungs show prominent bronchovascular markings and mild lower lung zone interstitial thickening, presumed chronic. Mild scarring noted at the apices. No convincing right pleural effusion.  No pneumothorax. Skeletal structures are grossly intact. IMPRESSION: 1. Moderate left pleural effusion associated with left lung base atelectasis or pneumonia. 2. No other evidence of acute cardiopulmonary disease. Electronically Signed   By: Lajean Manes M.D.   On: 09/01/2018 18:49    Pending Labs Unresulted Labs (From admission, onward)    Start     Ordered   09/01/18 2021  Creatinine, urine, random  Once,   R     09/01/18 2020   09/01/18 2021  Sodium, urine, random  Once,   R     09/01/18 2020   09/01/18 2021  Osmolality, urine  Once,   R     09/01/18 2020  09/01/18 2020  MRSA PCR Screening  Once,   R    Question:  Patient immune status   Answer:  Immunocompromised   09/01/18 2019   09/01/18 2020  Urinalysis, Routine w reflex microscopic  Once,   R     09/01/18 2020   09/01/18 2019  Respiratory Panel by PCR  (Respiratory virus panel with precautions)  Once,   R     09/01/18 2019   09/01/18 2019  Troponin I - Now Then Q6H  Now then every 6 hours,   R     09/01/18 2019   09/01/18 2016  Blood gas, venous  Once,   R     09/01/18 2015   09/01/18 1910  Blood culture (routine x 2)  BLOOD CULTURE X 2,   STAT     09/01/18 1910   Signed and Held  HIV antibody (Routine Screening)  Tomorrow morning,   R     Signed and Held   Signed and Held  Culture, sputum-assessment  Once,   R    Question:  Patient immune status  Answer:  Immunocompromised   Signed and Held   Signed and Held  Gram stain  Once,   R    Question:  Patient immune status  Answer:  Immunocompromised   Signed and Held   Signed and Held  Strep pneumoniae urinary antigen  Once,   R     Signed and Held   Signed and Held  Influenza panel by PCR (type A & B)  (Influenza PCR Panel)  Once,   R     Signed and Held   Signed and Held  Lactic acid, plasma  STAT Now then every 3 hours,   STAT     Signed and Held   Visual merchandiser and Held  Procalcitonin  ONCE - STAT,   R     Signed and Held   Signed and Held  Protime-INR  ONCE - STAT,   R     Signed and Held   Signed and Held  APTT  ONCE - STAT,   R     Signed and Held   Signed and Held  Magnesium  Tomorrow morning,   R    Comments:  Call MD if <1.5    Signed and Held   Signed and Held  Phosphorus  Tomorrow morning,   R     Signed and Held   Signed and Held  TSH  Once,   R    Comments:  Cancel if already done within 1 month and notify MD    Signed and Held   Signed and Held  Comprehensive metabolic panel  Once,   R    Comments:  Cal MD for K<3.5 or >5.0    Signed and Held   Signed and Held  CBC  Once,   R    Comments:  Call for hg <8.0    Signed and Held          Vitals/Pain Today's Vitals   09/01/18 1754 09/01/18  1826 09/01/18 1945 09/01/18 2000  BP:   (!) 164/85 (!) 154/79  Pulse:   (!) 107 (!) 109  Resp:   (!) 21 19  SpO2:  98% 98% 97%  Weight: 65.8 kg     Height: 5\' 2"  (1.575 m)     PainSc: 0-No pain       Isolation Precautions Droplet precaution  Medications Medications  iopamidol (ISOVUE-370) 76 % injection (has no  administration in time range)  labetalol (NORMODYNE,TRANDATE) injection 5 mg (has no administration in time range)  albuterol (PROVENTIL,VENTOLIN) solution continuous neb (10 mg/hr Nebulization Given 09/01/18 1822)  ipratropium (ATROVENT) nebulizer solution 0.5 mg (0.5 mg Nebulization Given 09/01/18 1824)  levofloxacin (LEVAQUIN) tablet 750 mg (750 mg Oral Given 09/01/18 2006)  iopamidol (ISOVUE-370) 76 % injection 100 mL (100 mLs Intravenous Contrast Given 09/01/18 2202)    Mobility walks

## 2018-09-01 NOTE — ED Provider Notes (Signed)
[facility]ERG[facility] DEPARTMENT Provider Note   CSN: [id number] Arrival date & time: 09/01/18  1743     History   Chief Complaint Chief Complaint  Patient presents with  . COPD  . URI    HPI QUEENIE AUFIERO is a 78 y.o. female.  78 year old female brought in by EMS from home for COPD exacerbation.  Patient states that she has had shortness of breath since Thanksgiving, symptoms worse since Christmas. Also cough, productive- yellow sputum, denies fevers or chills.  Shortness of breath is worse with exertion, she has been unable to sleep lying supine in bed and has been sleeping in a recliner since Thanksgiving.  She denies chest pain or palpitations, denies swelling. Patient saw her rheumatologist (on DMARD) 5 days ago, given z-pack and 6 day prednisone taper, no improvement.  Patient was given Solu-Medrol, magnesium, DuoNeb and albuterol by EMS prior to arrival, states very slight improvement however still struggling to breathe.     Past Medical History:  Diagnosis Date  . Diverticulitis   . Hypercholesteremia   . Hypertension   . Lupus ([place])   . Osteoporosis   . Tobacco use     Patient Active Problem List   Diagnosis Date Noted  . COPD exacerbation ([place]) 09/01/2018  . Acute respiratory failure ([place]) 09/01/2018  . Hyponatremia 09/01/2018  . Pleural effusion 09/01/2018  . 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 Rheumatoid arthritis involving multiple sites with positive rheumatoid factor ([place]) 12/12/2017  . Tobacco use 04/14/2017  . Carotid stenosis 04/14/2017  . Peripheral neuropathy 06/24/2016  . 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 Chronic bronchitis ([place]) 11/13/2015  . Osteoarthritis 10/05/2015  . Hyperlipidemia 10/05/2015  . Hypertension 10/05/2015  . Osteoporosis 10/05/2015  . Acid reflux 10/05/2015  . Peptic ulcer 10/05/2015  . Cataract 10/05/2015  . Tendinitis 10/05/2015  . Osteopenia 02/11/2014  . 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 ABDOMINAL HYSTERECTOMY  12/1969  . APPENDECTOMY  1957  . CATARACT EXTRACTION    . CHOLECYSTECTOMY  1997  . LAPAROSCOPIC SIGMOID COLECTOMY  2013   secondary to diverticulitis  . spit seed removal    . TONSILLECTOMY  1945     OB History   No obstetric history on file.      Home Medications    Prior to Admission medications   Medication Sig Start Date End Date Taking? Authorizing Provider  acetaminophen (TYLENOL) 500 MG tablet Take 1,000 mg by mouth every 6 (six) hours as needed.    [provider]  albuterol (PROVENTIL) (2.5 MG/3ML) 0.083% nebulizer solution USE 1 VIAL IN NEBULIZER AS DIRECTED EVERY 4 HOURS AS NEEDED 07/23/18   [name], [name], PA  ALPRAZolam (XANAX) 0.5 MG tablet TAKE 1 TABLET BY MOUTH TWICE A DAY AS NEEDED 08/20/18   [name], [name], PA  aspirin 81 MG tablet Take 81 mg by mouth daily.    [provider]  atorvastatin (LIPITOR) 40 MG tablet TAKE 1 TABLET BY MOUTH ONCE DAILY 05/14/18   [name], [name], PA  betamethasone dipropionate (DIPROLENE) 0.05 % cream Apply 2.58 application topically 2 (two) times daily.     [provider]  BIOTIN PO Take 5,000 mcg by mouth daily.     [provider]  Calcium Carbonate-Vitamin D (CALCIUM 600+D) 600-200 MG-UNIT TABS Take 1 tablet by mouth 2 (two) times daily.     [provider]  Carboxymethylcellul-Glycerin (OPTIVE) 0.5-0.9 %  SOLN Apply to eye.    [provider]  carboxymethylcellulose (REFRESH TEARS) 0.5 % SOLN 1 drop 3 (three) times daily as needed.    [provider]  cetirizine (ZYRTEC) 10 MG tablet Take 10 mg by mouth daily.    [provider]  clobetasol (TEMOVATE) 0.05 % external solution Apply 1 application topically as needed.    [provider]  clobetasol ointment (TEMOVATE) 0.05 % Apply 8.84 application topically 2 (two) times daily. 09/27/17    [provider]  Coenzyme Q10 (COQ10) 100 MG CAPS Take 100 mg by mouth daily.    [provider]  docusate sodium (COLACE) 100 MG capsule Take 100 mg by mouth 2 (two) times daily.    [provider]  folic acid (FOLVITE) 166 MCG tablet Take 1,000 mcg by mouth daily.     [provider]  gabapentin (NEURONTIN) 100 MG capsule Take 100 mg by mouth 3 (three) times daily.  09/08/15   [provider]  hydrochlorothiazide (HYDRODIURIL) 50 MG tablet TAKE 1 TABLET BY MOUTH ONCE A DAY 10/20/17   [name], [name], PA  hydroxychloroquine (PLAQUENIL) 200 MG tablet Take 200 mg by mouth daily.  11/07/17 11/07/18  [provider]  KRILL OIL PO Take 500 mg by mouth daily.     [provider]  methotrexate (RHEUMATREX) 2.5 MG tablet Take 20 mg by mouth once a week.  10/01/15   [provider]  Multiple Vitamins-Minerals (MULTIVITAMIN ADULT PO) Take by mouth daily.     [provider]  Omega 3 1000 MG CAPS Take 3 capsules by mouth daily.    [provider]  Potassium 99 MG TABS Take by mouth daily.    [provider]  PROAIR HFA 108 [phone] Base) MCG/ACT inhaler INHALE 2 PUFFS EVERY 6 HOURS AS NEEDED 05/24/18   [name], [name], PA  ranitidine (ZANTAC) 150 MG tablet Take 150 mg by mouth 2 (two) times daily.    [provider]  Vitamin D, Cholecalciferol, 1000 units TABS Take by mouth daily.     [provider]    Family History Family History  Problem Relation Age of Onset  . Alcohol abuse Mother   . Cirrhosis Mother   . Heart disease Father   . Hypertension Father   . Gout Father   . Ulcers Father   . Hyperlipidemia Father   . Alcohol abuse Brother   . Heart disease Brother        MI at age 54  . OCD Son   . Prostatitis Paternal Grandfather   . Pneumonia Paternal Grandfather   . Cancer Son     Social History Social History   Tobacco Use  . Smoking status: Current Every Day Smoker     Packs/day: 0.50    Types: Cigarettes  . Smokeless tobacco: Never Used  . Tobacco comment: started smoking at age 49  Substance Use Topics  . Alcohol use: No    Alcohol/week: 0.0 standard drinks  . Drug use: No     Allergies   Codeine; Cyclosporine; Other; Pregabalin; and Zolpidem   Review of Systems Review of Systems  Constitutional: Negative for chills and fever.  HENT: Negative for congestion.   Respiratory: Positive for cough, shortness of breath and wheezing.   Cardiovascular: Negative for chest pain, palpitations and leg swelling.  Gastrointestinal: Negative for abdominal pain, constipation, diarrhea, nausea and vomiting.  Musculoskeletal: Negative for arthralgias and myalgias.  Skin: Negative for rash  and wound.  Allergic/Immunologic: Positive for immunocompromised state.  Neurological: Negative for dizziness and weakness.  Hematological: Negative for adenopathy.  Psychiatric/Behavioral: Negative for confusion.  All other systems reviewed and are negative.    Physical Exam Updated Vital Signs BP (!) 154/79   Pulse (!) 109   Resp 19   Ht 5\' 2"  (1.575 m)   Wt 65.8 kg   SpO2 97%   BMI 26.52 kg/m   Physical Exam Vitals signs and nursing note reviewed.  Constitutional:      General: She is in acute distress.     Appearance: She is well-developed. She is not diaphoretic.     Comments: Tachypneic, able to speak in short sentences  HENT:     Head: Normocephalic and atraumatic.     Nose: Nose normal.     Mouth/Throat:     Mouth: Mucous membranes are moist.  Neck:     Musculoskeletal: Neck supple.  Cardiovascular:     Rate and Rhythm: Normal rate and regular rhythm.     Pulses: Normal pulses.     Heart sounds: Normal heart sounds.  Pulmonary:     Effort: Pulmonary effort is normal.     Breath sounds: Wheezing and rhonchi present.  Abdominal:     Tenderness: There is no abdominal tenderness.  Skin:    General: Skin is warm and dry.     Findings: No  erythema.  Neurological:     Mental Status: She is alert and oriented to person, place, and time.  Psychiatric:        Behavior: Behavior normal.      ED Treatments / Results  Labs (all labs ordered are listed, but only abnormal results are displayed) Labs Reviewed  BASIC METABOLIC PANEL - Abnormal; Notable for the following components:      Result Value   Sodium 130 (*)    Chloride 94 (*)    Glucose, Bld 158 (*)    All other components within normal limits  BRAIN NATRIURETIC PEPTIDE - Abnormal; Notable for the following components:   B Natriuretic Peptide 266.0 (*)    All other components within normal limits  CULTURE, BLOOD (ROUTINE X 2)  CULTURE, BLOOD (ROUTINE X 2)  RESPIRATORY PANEL BY PCR  MRSA PCR SCREENING  CBC WITH DIFFERENTIAL/PLATELET  BLOOD GAS, VENOUS  D-DIMER, QUANTITATIVE (NOT AT [facility])  TROPONIN I  TROPONIN I  TROPONIN I  URINALYSIS, ROUTINE W REFLEX MICROSCOPIC  CREATININE, URINE, RANDOM  SODIUM, URINE, RANDOM  OSMOLALITY, URINE  OSMOLALITY  I-STAT TROPONIN, ED    EKG EKG Interpretation  Date/Time:  Saturday September 01 2018 20:20:03 EST Ventricular Rate:  113 PR Interval:    QRS Duration: 84 QT Interval:  332 QTC Calculation: 456 R Axis:   -19 Text Interpretation:  Sinus tachycardia Multiform ventricular premature complexes Probable left atrial enlargement Borderline left axis deviation Anteroseptal infarct, age indeterminate Nonspecific T abnormalities, lateral leads No significant change since last tracing Confirmed by Wandra Arthurs ([id number]) on 09/01/2018 8:22:25 PM   Radiology Dg Chest Port 1 View  Result Date: 09/01/2018 CLINICAL DATA:  Short of breath. EXAM: PORTABLE CHEST 1 VIEW COMPARISON:  None. FINDINGS: Cardiac silhouette is partly obscured by left lung base opacity. Is grossly normal in size. No mediastinal or hilar masses. Opacity at the left lung base is consistent with a moderate pleural effusion with associated atelectasis or  pneumonia. Lungs show prominent bronchovascular markings and mild lower lung zone interstitial thickening, presumed chronic. Mild scarring noted  at the apices. No convincing right pleural effusion.  No pneumothorax. Skeletal structures are grossly intact. IMPRESSION: 1. Moderate left pleural effusion associated with left lung base atelectasis or pneumonia. 2. No other evidence of acute cardiopulmonary disease. Electronically Signed   By: Lajean Manes M.D.   On: 09/01/2018 18:49    Procedures Procedures (including critical care time)  Medications Ordered in ED Medications  albuterol (PROVENTIL,VENTOLIN) solution continuous neb (10 mg/hr Nebulization Given 09/01/18 1822)  ipratropium (ATROVENT) nebulizer solution 0.5 mg (0.5 mg Nebulization Given 09/01/18 1824)  levofloxacin (LEVAQUIN) tablet 750 mg (750 mg Oral Given 09/01/18 2006)     Initial Impression / Assessment and Plan / ED Course  I have reviewed the triage vital signs and the nursing notes.  Pertinent labs & imaging results that were available during my care of the patient were reviewed by me and considered in my medical decision making (see chart for details).  Clinical Course as of Sep 01 2029  Sat Jan 04, 66110  1714 78 year old female with history of COPD, lupus, on methotrexate presents with complaint of shortness of breath.  Patient states symptoms started at Thanksgiving, have been progressively worse since Christmas, worse with exertion, no relief with home meds, no improvement with steroid dose pack and z-pack (started 5 days ago). Brought in by EMS today, given duoneb/albuterol, 125mg  Solumedrol, 2g Mg PTA. Reports slight improvement with EMS treatment however still significantly short of breath, tachypneic and able to speak in very short sentences/phrases.  Patient was given continuous albuterol neb with an additional dose of Atrovent.  States slight improvement however still very short of breath.  Chest x-ray shows moderate left  pleural effusion. EKG with occasional PVCs, BNP 266, BMP with slight hyponatremia, CBC WNL, trop negative. Blood cultures ordered as well as dose of Levaquin. Patient was seen by Dr. Darl Householder, ER attending, agrees with plan to consult for admission for PNA and COPD exacerbation.    [LM]  2030 Discussed with Dr. Roel Cluck, tried hospitalist, requests CTA for further evaluation of her effusion and to evaluate for PE.  Patient may be unable to lie supine for exam however we will try to try to get the study done.   [LM]    Clinical Course User Index [LM] Tacy Learn, PA-C   Final Clinical Impressions(s) / ED Diagnoses   Final diagnoses:  Community acquired pneumonia of left lower lobe of lung [facility])  COPD exacerbation [facility])    ED Discharge Orders    None       Roque Lias 09/01/18 2031    Drenda Freeze, MD 09/01/18 [phone]

## 2018-09-01 NOTE — ED Notes (Signed)
POA, Elberta Fortis, would like an update on mother when possible, at [phone]

## 2018-09-01 NOTE — ED Triage Notes (Signed)
Pt presents to ED from home via [facility]. Pt is having a COPD exacerbation. EMS also reports new PVC's and the patient has no cardiac history. Pt reports a cold x1 week that has caused her COPD to bother her.  Pt received 125 mg solumedrol, 2 mg Mag, 5 mg albuterol, and duoneb   BP 180/90 HR 110 98% with duoneb

## 2018-09-01 NOTE — Progress Notes (Addendum)
Pt experiencing tachycardia upwards into 170s-180s then going back down. MD aware. Will continue to monitor. Pt is not in distress and is alert and oriented

## 2018-09-01 NOTE — Progress Notes (Addendum)
Pharmacy Antibiotic Note  Donna Mcneil is a 78 y.o. female admitted on 09/01/2018 with pneumonia.  Pharmacy has been consulted for Vancomycin dosing. Hx COPD. WBC WNL. Renal function good.   Plan: Vancomycin 1000 mg IV q24h >>Estimated AUC: 469 Cefepime per MD Trend WBC, temp, renal function  F/U infectious work-up Drug levels as indicated   Height: 5\' 2"  (157.5 cm) Weight: 145 lb (65.8 kg) IBW/kg (Calculated) : 50.1  No data recorded.  Recent Labs  Lab 09/01/18 1816  WBC 8.0  CREATININE 0.75    Estimated Creatinine Clearance: 51.6 mL/min (by C-G formula based on SCr of 0.75 mg/dL).    Allergies  Allergen Reactions  . Codeine Other (See Comments)    GI upset  . Cyclosporine Other (See Comments)    Burning eyes  . Other     Other reaction(s): OTHER  . Pregabalin Other (See Comments)    Personality changes-becomes angry  . Zolpidem     Other reaction(s): Leslee Home 09/01/2018 11:52 PM

## 2018-09-02 ENCOUNTER — Inpatient Hospital Stay (HOSPITAL_COMMUNITY): Payer: Medicare Other

## 2018-09-02 DIAGNOSIS — J9601 Acute respiratory failure with hypoxia: Secondary | ICD-10-CM

## 2018-09-02 DIAGNOSIS — R918 Other nonspecific abnormal finding of lung field: Secondary | ICD-10-CM | POA: Diagnosis present

## 2018-09-02 DIAGNOSIS — J441 Chronic obstructive pulmonary disease with (acute) exacerbation: Secondary | ICD-10-CM

## 2018-09-02 DIAGNOSIS — J90 Pleural effusion, not elsewhere classified: Secondary | ICD-10-CM

## 2018-09-02 LAB — GLUCOSE, PLEURAL OR PERITONEAL FLUID: Glucose, Fluid: 86 mg/dL

## 2018-09-02 LAB — PHOSPHORUS: Phosphorus: 3.8 mg/dL (ref 2.5–4.6)

## 2018-09-02 LAB — COMPREHENSIVE METABOLIC PANEL
ALT: 18 U/L (ref 0–44)
AST: 17 U/L (ref 15–41)
Albumin: 2.9 g/dL — ABNORMAL LOW (ref 3.5–5.0)
Alkaline Phosphatase: 43 U/L (ref 38–126)
Anion gap: 11 (ref 5–15)
BUN: 16 mg/dL (ref 8–23)
CO2: 25 mmol/L (ref 22–32)
Calcium: 8.9 mg/dL (ref 8.9–10.3)
Chloride: 95 mmol/L — ABNORMAL LOW (ref 98–111)
Creatinine, Ser: 0.76 mg/dL (ref 0.44–1.00)
GFR calc Af Amer: >60 mL/min (ref >60)
GFR calc non Af Amer: >60 mL/min (ref >60)
Glucose, Bld: 136 mg/dL — ABNORMAL HIGH (ref 70–99)
Potassium: 3.5 mmol/L (ref 3.5–5.1)
SODIUM: 131 mmol/L — AB (ref 135–145)
Total Bilirubin: 1.2 mg/dL (ref 0.3–1.2)
Total Protein: 6.8 g/dL (ref 6.5–8.1)

## 2018-09-02 LAB — CBC
HCT: 35.4 % — ABNORMAL LOW (ref 36.0–46.0)
Hemoglobin: 12.5 g/dL (ref 12.0–15.0)
MCH: 31.6 pg (ref 26.0–34.0)
MCHC: 35.3 g/dL (ref 30.0–36.0)
MCV: 89.6 fL (ref 80.0–100.0)
PLATELETS: 268 10*3/uL (ref 150–400)
RBC: 3.95 MIL/uL (ref 3.87–5.11)
RDW: 13.4 % (ref 11.5–15.5)
WBC: 10 10*3/uL (ref 4.0–10.5)
nRBC: 0 % (ref 0.0–0.2)

## 2018-09-02 LAB — BODY FLUID CELL COUNT WITH DIFFERENTIAL
EOS FL: 1 %
Lymphs, Fluid: 67 %
MONOCYTE-MACROPHAGE-SEROUS FLUID: 9 % — AB (ref 50–90)
Neutrophil Count, Fluid: 23 % (ref 0–25)
Total Nucleated Cell Count, Fluid: 145 cu mm (ref 0–1000)

## 2018-09-02 LAB — GRAM STAIN

## 2018-09-02 LAB — RESPIRATORY PANEL BY PCR
Adenovirus: NOT DETECTED
BORDETELLA PERTUSSIS-RVPCR: NOT DETECTED
Chlamydophila pneumoniae: NOT DETECTED
Coronavirus 229E: NOT DETECTED
Coronavirus HKU1: NOT DETECTED
Coronavirus NL63: NOT DETECTED
Coronavirus OC43: NOT DETECTED
Influenza A: NOT DETECTED
Influenza B: NOT DETECTED
METAPNEUMOVIRUS-RVPPCR: NOT DETECTED
Mycoplasma pneumoniae: NOT DETECTED
Parainfluenza Virus 1: NOT DETECTED
Parainfluenza Virus 2: NOT DETECTED
Parainfluenza Virus 3: NOT DETECTED
Parainfluenza Virus 4: NOT DETECTED
RESPIRATORY SYNCYTIAL VIRUS-RVPPCR: NOT DETECTED
Rhinovirus / Enterovirus: NOT DETECTED

## 2018-09-02 LAB — MAGNESIUM: MAGNESIUM: 2.1 mg/dL (ref 1.7–2.4)

## 2018-09-02 LAB — LACTIC ACID, PLASMA
Lactic Acid, Venous: 2.9 mmol/L (ref 0.5–1.9)
Lactic Acid, Venous: 1.1 mmol/L (ref 0.5–1.9)

## 2018-09-02 LAB — URINALYSIS, ROUTINE W REFLEX MICROSCOPIC
Bacteria, UA: NONE SEEN
Bilirubin Urine: NEGATIVE
Glucose, UA: NEGATIVE mg/dL
Hgb urine dipstick: NEGATIVE
Ketones, ur: 5 mg/dL — AB
Leukocytes, UA: NEGATIVE
Nitrite: NEGATIVE
Protein, ur: 30 mg/dL — AB
pH: 6 (ref 5.0–8.0)

## 2018-09-02 LAB — LACTATE DEHYDROGENASE, PLEURAL OR PERITONEAL FLUID: LD, Fluid: 729 U/L — ABNORMAL HIGH (ref 3–23)

## 2018-09-02 LAB — APTT: aPTT: 33 seconds (ref 24–36)

## 2018-09-02 LAB — PROTEIN, PLEURAL OR PERITONEAL FLUID: Total protein, fluid: 5.6 g/dL

## 2018-09-02 LAB — PROCALCITONIN

## 2018-09-02 LAB — TROPONIN I: Troponin I: <0.03 ng/mL (ref <0.03)

## 2018-09-02 LAB — OSMOLALITY, URINE: OSMOLALITY UR: 706 mosm/kg (ref 300–900)

## 2018-09-02 LAB — ALBUMIN, PLEURAL OR PERITONEAL FLUID: Albumin, Fluid: 2.8 g/dL

## 2018-09-02 LAB — STREP PNEUMONIAE URINARY ANTIGEN: Strep Pneumo Urinary Antigen: NEGATIVE

## 2018-09-02 LAB — PROTIME-INR
INR: 0.98
Prothrombin Time: 12.9 seconds (ref 11.4–15.2)

## 2018-09-02 LAB — TSH: TSH: 0.225 u[IU]/mL — ABNORMAL LOW (ref 0.350–4.500)

## 2018-09-02 LAB — LACTATE DEHYDROGENASE: LDH: 225 U/L — ABNORMAL HIGH (ref 98–192)

## 2018-09-02 LAB — ALBUMIN: ALBUMIN: 3 g/dL — AB (ref 3.5–5.0)

## 2018-09-02 LAB — PROTEIN, TOTAL: Total Protein: 7.2 g/dL (ref 6.5–8.1)

## 2018-09-02 LAB — T4, FREE: FREE T4: 1.21 ng/dL (ref 0.82–1.77)

## 2018-09-02 LAB — CREATININE, URINE, RANDOM: Creatinine, Urine: 147.45 mg/dL

## 2018-09-02 LAB — MRSA PCR SCREENING: MRSA by PCR: NEGATIVE

## 2018-09-02 LAB — SODIUM, URINE, RANDOM: Sodium, Ur: 24 mmol/L

## 2018-09-02 MED ORDER — SODIUM CHLORIDE 0.9 % IV BOLUS
500.0000 mL | Freq: Once | INTRAVENOUS | Status: AC
  Administered 2018-09-02: 500 mL via INTRAVENOUS

## 2018-09-02 MED ORDER — IPRATROPIUM-ALBUTEROL 0.5-2.5 (3) MG/3ML IN SOLN
3.0000 mL | Freq: Four times a day (QID) | RESPIRATORY_TRACT | Status: DC
  Administered 2018-09-02 (×2): 3 mL via RESPIRATORY_TRACT
  Filled 2018-09-02 (×2): qty 3

## 2018-09-02 MED ORDER — LIDOCAINE HCL 1 % IJ SOLN
INTRAMUSCULAR | Status: AC
  Administered 2018-09-02: 15:00:00
  Filled 2018-09-02: qty 20

## 2018-09-02 MED ORDER — VANCOMYCIN HCL IN DEXTROSE 1-5 GM/200ML-% IV SOLN
1000.0000 mg | INTRAVENOUS | Status: DC
  Administered 2018-09-02 – 2018-09-03 (×3): 1000 mg via INTRAVENOUS
  Filled 2018-09-02 (×3): qty 200

## 2018-09-02 MED ORDER — FENTANYL CITRATE (PF) 100 MCG/2ML IJ SOLN
INTRAMUSCULAR | Status: DC | PRN
  Administered 2018-09-02: 25 ug via INTRAVENOUS
  Administered 2018-09-02: 50 ug via INTRAVENOUS
  Administered 2018-09-02: 25 ug via INTRAVENOUS

## 2018-09-02 MED ORDER — MIDAZOLAM HCL 2 MG/2ML IJ SOLN
INTRAMUSCULAR | Status: AC | PRN
  Administered 2018-09-02 (×4): 1 mg via INTRAVENOUS

## 2018-09-02 MED ORDER — FENTANYL CITRATE (PF) 100 MCG/2ML IJ SOLN
INTRAMUSCULAR | Status: AC
  Filled 2018-09-02: qty 2

## 2018-09-02 MED ORDER — MIDAZOLAM HCL 2 MG/2ML IJ SOLN
INTRAMUSCULAR | Status: AC
  Filled 2018-09-02: qty 4

## 2018-09-02 NOTE — Progress Notes (Signed)
CRITICAL VALUE ALERT  Critical Value:  Lactic acid  Date & Time Notied:  09/02/18 0110  Provider Notified: Kennon Holter  Orders Received/Actions taken: Orders Pending

## 2018-09-02 NOTE — Progress Notes (Signed)
Dr. Lake Bells at bedside for thoracentesis. Consents signed.

## 2018-09-02 NOTE — Evaluation (Addendum)
Occupational Therapy Evaluation Patient Details Name: Donna Mcneil MRN: [id number] DOB: Sep 18, 1940 Today's Date: 09/02/2018    History of Present Illness Pt is a 78 y.o. F with significant PMH of COPD, RA on methotrexate, lupus who presents with dyspnea and cough mucus production. Unable to drain fluid with thoracentesis attempt. CT shows moderate volume, loculated left pleural effusion. Plan to proceed with CT guided pigtail pleural drainage catheter placement to drain fluid.    Clinical Impression   PTA, pt was living alone and was performing BADLs and ex-husband and grandson assisting with IADLs. Pt currently performing grooming while seated at EOB with set up and supervision. Pt requesting to not perform transfers since she recently worked with PT and was going to procedure later today. Pt presenting with significant fatigue and SpO2 dropped to 92% on 1.5L with conversation (HR 109). Pt would benefit from further acute OT to facilitate safe dc. Pending pt progress, recommend dc to home with HHOT for further OT to optimize safety, independence with ADLs, and return to PLOF. However, if pt does not progress, she may need ST-rehab at SNF to optimize independent and safety prior to returning home.      Follow Up Recommendations  Home health OT;Supervision - Intermittent;SNF    Equipment Recommendations  None recommended by OT    Recommendations for Other Services PT consult     Precautions / Restrictions Precautions Precautions: Fall Precaution Comments: watch O2, HR Restrictions Weight Bearing Restrictions: No      Mobility Bed Mobility               General bed mobility comments: Sitting on EOB upon arrival  Transfers Overall transfer level: Needs assistance Equipment used: Rolling walker (2 wheeled) Transfers: Sit to/from Stand Sit to Stand: Supervision         General transfer comment: Declined to stand at this time due to fatigue. Reports she recently  stood with PT without assistance.     Balance Overall balance assessment: Needs assistance Sitting-balance support: Feet supported Sitting balance-Leahy Scale: Good     Standing balance support: Bilateral upper extremity supported Standing balance-Leahy Scale: Poor                             ADL either performed or assessed with clinical judgement   ADL Overall ADL's : Needs assistance/impaired     Grooming: Supervision/safety;Set up;Oral care;Wash/dry face;Sitting                                 General ADL Comments: Pt performing grooming while seated at EOB with set up and supervision. Sitting at EOB, pt was fatigued and SOB. SpO2 92% on 1.5 LO2 during conversation and then would elevate to 99% when pt stopped talking. Pt requesting to not stand at this time since she had recently worked with PT and was comfortable.      Vision Baseline Vision/History: Wears glasses Wears Glasses: At all times Patient Visual Report: No change from baseline       Perception     Praxis      Pertinent Vitals/Pain Pain Assessment: No/denies pain     Hand Dominance     Extremity/Trunk Assessment Upper Extremity Assessment Upper Extremity Assessment: Overall WFL for tasks assessed   Lower Extremity Assessment Lower Extremity Assessment: Overall WFL for tasks assessed       Communication Communication Communication:  No difficulties   Cognition Arousal/Alertness: Awake/alert Behavior During Therapy: WFL for tasks assessed/performed Overall Cognitive Status: Within Functional Limits for tasks assessed                                     General Comments  SpO2 92%-99% on 1.5L O2. Providing education on purse lip breathing    Exercises     Shoulder Instructions      Home Living Family/patient expects to be discharged to:: Private residence Living Arrangements: Alone Available Help at Discharge: Neighbor;Family;Available  PRN/intermittently(grandson, ex husband) Type of Home: Apartment Home Access: Ramped entrance     Home Layout: One level     Bathroom Shower/Tub: Tub/shower unit         Home Equipment: Walker - 4 wheels          Prior Functioning/Environment Level of Independence: Independent with assistive device(s)        Comments: Uses Rollator for mobility, sponge bathes, ex husband drives her once every 2 weeks to grocery store and she mainly cooks microwaveable meals        OT Problem List: Decreased strength;Decreased range of motion;Decreased activity tolerance;Impaired balance (sitting and/or standing);Decreased knowledge of use of DME or AE;Decreased knowledge of precautions;Cardiopulmonary status limiting activity      OT Treatment/Interventions: Self-care/ADL training;Therapeutic exercise;Energy conservation;DME and/or AE instruction;Therapeutic activities;Patient/family education    OT Goals(Current goals can be found in the care plan section) Acute Rehab OT Goals Patient Stated Goal: "Be able to return home" OT Goal Formulation: With patient Time For Goal Achievement: 09/16/18 Potential to Achieve Goals: Good ADL Goals Pt Will Perform Grooming: with modified independence;standing Pt Will Perform Lower Body Dressing: with modified independence;sit to/from stand Pt Will Transfer to Toilet: with modified independence;ambulating;bedside commode Pt Will Perform Toileting - Clothing Manipulation and hygiene: with modified independence;sit to/from stand  OT Frequency: Min 2X/week   Barriers to D/C:            Co-evaluation              AM-PAC OT "6 Clicks" Daily Activity     Outcome Measure Help from another person eating meals?: None Help from another person taking care of personal grooming?: None Help from another person toileting, which includes using toliet, bedpan, or urinal?: A Little Help from another person bathing (including washing, rinsing, drying)?: A  Little Help from another person to put on and taking off regular upper body clothing?: A Little Help from another person to put on and taking off regular lower body clothing?: A Little 6 Click Score: 20   End of Session Equipment Utilized During Treatment: Oxygen Nurse Communication: Other (comment)(SpO2)  Activity Tolerance: Patient limited by fatigue Patient left: in bed;with call bell/phone within reach  OT Visit Diagnosis: Unsteadiness on feet (R26.81);Other abnormalities of gait and mobility (R26.89);Muscle weakness (generalized) (M62.81)                Time: 6659-9357 OT Time Calculation (min): 17 min Charges:  OT General Charges $OT Visit: 1 Visit OT Evaluation $OT Eval Moderate Complexity: [place], OTR/L Acute Rehab Pager: [phone] Office: [place] 09/02/2018, 12:15 PM

## 2018-09-02 NOTE — Consult Note (Signed)
NAME:  Donna Mcneil, MRN:  [id number], DOB:  [birth year], LOS: 1 ADMISSION DATE:  09/01/2018, CONSULTATION DATE:  09/02/2017 REFERRING MD:  Roel Cluck, CHIEF COMPLAINT:  Dyspnea   Brief History   78 y/o female with COPD and RA on methotrexate admitted on 1/4 with dyspnea, cough mucus production.    History of present illness   This is a 78 year old female with a past medical history significant for COPD, rheumatoid arthritis on methotrexate and lupus who presented to [facility] on September 01, 2018 in the setting of cough, shortness of breath, and failure of symptoms to resolve after being treated with azithromycin and prednisone.  She has smoked cigarettes for many years and continues to smoke.  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  She has been treated with methotrexate, Enbrel in the past (not taking currently), and Plaquenil.  She has reported increasing shortness of breath ever since November 2019 and has been seen by her physician for similar symptoms.  She was most recently treated with azithromycin and prednisone but had no improvement in symptoms so she came to the emergency department.  In the emergency department she was noted to have a pleural effusion on the left which was loculated.  She tells me that she has had increasing shortness of breath ever since November.  She has coughed up clear mucus for years but in the last several weeks it has been yellow to green in color.  She denies fevers or chills.  She has lost quite a bit of weight over the last several months.  She thinks may be 30 pounds or so.  She denies chest pain.  However, she does have significant pain from her rheumatoid arthritis in multiple joints and she says this is been steadily worsening ever since Enbrel was held over a year ago in the setting of her lupus.  Past Medical History  COPD Cigarette smoking, currently smoking Rheumatoid  arthritis History of lupus Osteoporosis Hypertension Diverticulitis Hypercholesterolemia  Significant Hospital Events     Consults:  Pulmonology  Procedures:    Significant Diagnostic Tests:  September 01, 2018 CT chest images independently reviewed: There is centrilobular emphysema bilaterally, mild to moderate in severity, the right lung is essentially clear but in the left there is a moderate sized partially loculated pleural effusion compressing the left lower lobe and partially the left upper lobe, there is a nodule adjacent to the pleural fluid in the left upper lobe of undetermined significance, there is also mediastinal and hilar adenopathy most significant on the left  Micro Data:  1/5 Pleural fluid bacterial culture> 1/5 pleural fluid fungus culture>  1/5 pleural fluid afb culture>  Antimicrobials:     Interim history/subjective:  As above  Objective   Blood pressure (!) 165/92, pulse 99, temperature 98.4 F (36.9 C), temperature source Oral, resp. rate 19, height 5\' 2"  (1.575 m), weight 66 kg, SpO2 94 %.    FiO2 (%):  [28 %] 28 %   Intake/Output Summary (Last 24 hours) at 09/02/2018 0721 Last data filed at 09/02/2018 0300 Gross per 24 hour  Intake 1300 ml  Output -  Net 1300 ml   Filed Weights   09/01/18 1754 09/02/18 0534  Weight: 65.8 kg 66 kg    Examination:  Gen: chronically ill appearing, in bed HENT: NCAT, OP clear, neck supple without masses Eyes: PERRL, EOMi Lymph: no cervical lymphadenopathy PULM: diminished on left, some wheezing upper lobes  bilaterally CV: RRR, no mgr, no JVD GI: BS+, soft, nontender, no hsm Derm: no rash or skin breakdown MSK: normal bulk and tone Neuro: A&Ox4, CN II-XII intact, strength 5/5 in all 4 extremities Psyche: normal mood and affect   Resolved Hospital Problem list     Assessment & Plan:  Loculated pleural effusion with nodularity in the left lung and mediastinal adenopathy: The differential diagnosis is  broad and I think the likelihood of bronchogenic malignancy, rheumatoid arthritis associated nodules and effusion, or infection are all equal considering her clinical context Plan: Thoracentesis now: We will send routine labs, cytology, culture At this point hold off on drainage or thoracic surgery consultation until pleural fluid analysis is available  COPD with acute exacerbation: Continue DuoNeb scheduled, continue prednisone as ordered 40 mg daily  Cigarette smoker: Counseled to quit  Best practice:   Per primary team  Labs   CBC: Recent Labs  Lab 09/01/18 1816 09/02/18 0315  WBC 8.0 10.0  NEUTROABS 6.9  --   HGB 13.8 12.5  HCT 39.9 35.4*  MCV 91.7 89.6  PLT 282 962    Basic Metabolic Panel: Recent Labs  Lab 09/01/18 1816 09/02/18 0315  NA 130* 131*  K 3.5 3.5  CL 94* 95*  CO2 24 25  GLUCOSE 158* 136*  BUN 14 16  CREATININE 0.75 0.76  CALCIUM 9.4 8.9  MG  --  2.1  PHOS  --  3.8   GFR: Estimated Creatinine Clearance: 51.7 mL/min (by C-G formula based on SCr of 0.76 mg/dL). Recent Labs  Lab 09/01/18 1816 09/02/18 0031 09/02/18 0315  PROCALCITON  --  <0.10  --   WBC 8.0  --  10.0  LATICACIDVEN  --  2.9* 1.1    Liver Function Tests: Recent Labs  Lab 09/02/18 0315  AST 17  ALT 18  ALKPHOS 43  BILITOT 1.2  PROT 6.8  ALBUMIN 2.9*   No results for input(s): LIPASE, AMYLASE in the last 168 hours. No results for input(s): AMMONIA in the last 168 hours.  ABG    Component Value Date/Time   HCO3 26.0 09/01/2018 2046   TCO2 27 09/01/2018 2046   O2SAT 89.0 09/01/2018 2046     Coagulation Profile: Recent Labs  Lab 09/02/18 0031  INR 0.98    Cardiac Enzymes: Recent Labs  Lab 09/01/18 2019 09/02/18 0315  TROPONINI <0.03 <0.03    HbA1C: No results found for: HGBA1C  CBG: No results for input(s): GLUCAP in the last 168 hours.  Review of Systems:   Gen: Denies fever, chills, + weight change, + fatigue, night sweats HEENT: Denies  blurred vision, double vision, hearing loss, tinnitus, sinus congestion, rhinorrhea, sore throat, neck stiffness, dysphagia PULM: per HPI CV: Denies chest pain, edema, orthopnea, paroxysmal nocturnal dyspnea, palpitations GI: Denies abdominal pain, nausea, vomiting, diarrhea, hematochezia, melena, constipation, change in bowel habits GU: Denies dysuria, hematuria, polyuria, oliguria, urethral discharge Endocrine: Denies hot or cold intolerance, polyuria, polyphagia or appetite change Derm: Denies rash, dry skin, scaling or peeling skin change Heme: Denies easy bruising, bleeding, bleeding gums Neuro: Denies headache, numbness, weakness, slurred speech, loss of memory or consciousness   Past Medical History  She,  has a past medical history of Diverticulitis, Hypercholesteremia, Hypertension, Lupus ([place]), Osteoporosis, and Tobacco use.   Surgical History    Past Surgical History:  Procedure Laterality Date  . ABDOMINAL HYSTERECTOMY  12/1969  . APPENDECTOMY  1957  . CATARACT EXTRACTION    . CHOLECYSTECTOMY  1997  .  LAPAROSCOPIC SIGMOID COLECTOMY  2013   secondary to diverticulitis  . spit seed removal    . TONSILLECTOMY  1945     Social History   reports that she has been smoking cigarettes. She has been smoking about 0.50 packs per day. She has never used smokeless tobacco. She reports that she does not drink alcohol or use drugs.   Family History   Her family history includes Alcohol abuse in her brother and mother; Cancer in her son; Cirrhosis in her mother; Gout in her father; Heart disease in her brother and father; Hyperlipidemia in her father; Hypertension in her father; OCD in her son; Pneumonia in her paternal grandfather; Prostatitis in her paternal grandfather; Ulcers in her father.   Allergies Allergies  Allergen Reactions  . Codeine Other (See Comments)    GI upset  . Cyclosporine Other (See Comments)    Burning eyes  . Other     Other reaction(s): OTHER  .  Pregabalin Other (See Comments)    Personality changes-becomes angry  . Zolpidem     Other reaction(s): ITCHING     Home Medications  Prior to Admission medications   Medication Sig Start Date End Date Taking? Authorizing Provider  acetaminophen (TYLENOL) 500 MG tablet Take 1,000 mg by mouth every 6 (six) hours as needed for mild pain.    Yes [provider]  albuterol (PROVENTIL) (2.5 MG/3ML) 0.083% nebulizer solution USE 1 VIAL IN NEBULIZER AS DIRECTED EVERY 4 HOURS AS NEEDED Patient taking differently: Take 2.5 mg by nebulization every 4 (four) hours as needed for wheezing or shortness of breath.  07/23/18  Yes [name], [name], PA  ALPRAZolam (XANAX) 0.5 MG tablet TAKE 1 TABLET BY MOUTH TWICE A DAY AS NEEDED Patient taking differently: Take 0.75 mg by mouth at bedtime.  08/20/18  Yes [name], [name], PA  aspirin 81 MG tablet Take 81 mg by mouth daily.   Yes [provider]  atorvastatin (LIPITOR) 40 MG tablet TAKE 1 TABLET BY MOUTH ONCE DAILY Patient taking differently: Take 40 mg by mouth every evening.  05/14/18  Yes [name], [name], PA  betamethasone dipropionate (DIPROLENE) 0.05 % cream Apply 7.51 application topically 2 (two) times daily as needed (skin care).    Yes [provider]  BIOTIN PO Take 5,000 mcg by mouth daily.    Yes [provider]  Calcium Carbonate-Vitamin D (CALCIUM 600+D) 600-200 MG-UNIT TABS Take 1 tablet by mouth 2 (two) times daily.    Yes [provider]  Carboxymethylcellul-Glycerin (OPTIVE) 0.5-0.9 % SOLN Place 1 drop into both eyes 3 (three) times daily as needed for dry eyes.    Yes [provider]  carboxymethylcellulose (REFRESH TEARS) 0.5 % SOLN Place 1 drop into both eyes 3 (three) times daily as needed (dry eyes).    Yes [provider]  cetirizine (ZYRTEC) 10 MG tablet Take 10 mg by mouth daily.   Yes [provider]  clobetasol (TEMOVATE) 0.05 % external solution Apply 1  application topically daily as needed (skin care).    Yes [provider]  Coenzyme Q10 (COQ10) 100 MG CAPS Take 100 mg by mouth daily.   Yes [provider]  docusate sodium (COLACE) 100 MG capsule Take 100 mg by mouth 2 (two) times daily.   Yes [provider]  folic acid (FOLVITE) 025 MCG tablet Take 1,000 mcg by mouth daily.    Yes [provider]  gabapentin (NEURONTIN) 100 MG capsule Take 100  mg by mouth 3 (three) times daily.  09/08/15  Yes [provider]  hydrochlorothiazide (HYDRODIURIL) 50 MG tablet TAKE 1 TABLET BY MOUTH ONCE A DAY Patient taking differently: Take 50 mg by mouth daily.  10/20/17  Yes [name], [name], PA  hydroxychloroquine (PLAQUENIL) 200 MG tablet Take 200 mg by mouth daily.  11/07/17 11/07/18 Yes [provider]  KRILL OIL PO Take 500 mg by mouth daily.    Yes [provider]  methotrexate (RHEUMATREX) 2.5 MG tablet Take 20 mg by mouth every Monday.  10/01/15  Yes [provider]  Multiple Vitamins-Minerals (MULTIVITAMIN ADULT PO) Take 1 tablet by mouth daily.    Yes [provider]  Omega 3 1000 MG CAPS Take 3 capsules by mouth daily.   Yes [provider]  Potassium 99 MG TABS Take 1 tablet by mouth daily.    Yes [provider]  PROAIR HFA 108 (90 Base) MCG/ACT inhaler INHALE 2 PUFFS EVERY 6 HOURS AS NEEDED Patient taking differently: Inhale 2 puffs into the lungs every 6 (six) hours as needed for wheezing or shortness of breath.  05/24/18  Yes [name], [name], PA  ranitidine (ZANTAC) 150 MG tablet Take 150 mg by mouth daily as needed for heartburn.    Yes [provider]  Vitamin D, Cholecalciferol, 1000 units TABS Take 1 tablet by mouth at bedtime.    Yes [provider]     Roselie Awkward, MD [facility] Pager: [phone] Cell: [phone] If no response, call [phone]

## 2018-09-02 NOTE — Progress Notes (Signed)
Patient back from having chest tube placed. Dressing dry and intact. Chest tube connected to wall suction. Bloody drainage noted in canister. Will continue to monitor.

## 2018-09-02 NOTE — Procedures (Signed)
Thoracentesis Procedure Note  Pre-operative Diagnosis: loculated pleural effusion  Post-operative Diagnosis: same  Indications: loculated pleural effusion, smoker  Procedure Details  Consent: Informed consent was obtained. Risks of the procedure were discussed including: infection, bleeding, pain, pneumothorax.  Under sterile conditions the patient was positioned. Betadine solution and sterile drapes were utilized.  1% buffered lidocaine was used to anesthetize the 10th rib space. The needle was inserted 2.5-3cm into the fluid but no fluid could be obtained, only aobut 0.5cc of dark blood was aspirated.  At this point the procedure was terminated.  A dressing was applied to the wound and wound care instructions were provided.   Findings No fluid was obtained  Complications:  None; patient tolerated the procedure well.          Condition: stable  Plan A follow up chest x-ray was ordered. Bed Rest for 0 hours. Tylenol 650 mg. for pain. CT guided drainage catheter order placed, discussed with IR team  Attending Attestation: I performed the procedure.  Roselie Awkward, MD [place] [facility] Pager: [phone] Cell: [phone] If no response, call [phone]

## 2018-09-02 NOTE — Progress Notes (Signed)
Patient currently on 2 lpm [place].  No distress noted at this time.  Patient states her breathing is feeling better. In reference to BIPAP patient states " placing any mask on her face will make her breathing worse". Encouraged patient to call for RN/RT if she has trouble breathing.

## 2018-09-02 NOTE — Progress Notes (Signed)
MD paged by RN per clarification of  Continuous BIPAP order.  Originated in ED. Patient not requiring BIPAP at this time. No distress noted. Patient states breathing is much better.

## 2018-09-02 NOTE — Procedures (Signed)
Interventional Radiology Procedure Note  Procedure: CT guided placement of left pleural drain  Complications: None  Estimated Blood Loss: < 10 mL  Findings: Aspiration yielded grossly bloody pleural fluid. 12 Fr pigtail drain placed from lower lateral approach with ascending angle. Good return of bloody fluid. 120 mL sample sent for labs.  Tube connected to [place] at -20 cm H2O.  Venetia Night. Kathlene Cote, M.D Pager:  [phone]

## 2018-09-02 NOTE — Evaluation (Signed)
Physical Therapy Evaluation Patient Details Name: Donna Mcneil MRN: [id number] DOB: 1940/10/06 Today's Date: 09/02/2018   History of Present Illness  Pt is a 78 y.o. F with significant PMH of COPD, RA on methotrexate, lupus who presents with dyspnea and cough mucus production. Unable to drain fluid with thoracentesis attempt. CT shows moderate volume, loculated left pleural effusion. Plan to proceed with CT guided pigtail pleural drainage catheter placement to drain fluid.   Clinical Impression  Pt admitted with above diagnosis. Pt currently with functional limitations due to the deficits listed below (see PT Problem List). Prior to admission, patient living alone, independent with ADL's and ambulates using Rollator. Limited evaluation due to pt displaying dyspnea with exertion and desaturation. Able to stand and ambulate 2 feet with walker and supervision level assist. Desaturation to 87% on 2L O2, DOE 4/4, HR 106-107 bpm. Rebounded to 92% SpO2 with seated rest break. Pt will benefit from skilled PT to increase their independence and safety with mobility to allow discharge to the venue listed below.       Follow Up Recommendations Home health PT;Supervision - Intermittent ([place] aide; may need SNF pending progress)    Equipment Recommendations  None recommended by PT    Recommendations for Other Services       Precautions / Restrictions Precautions Precautions: Fall Precaution Comments: watch O2, HR Restrictions Weight Bearing Restrictions: No      Mobility  Bed Mobility               General bed mobility comments: Sitting on EOB upon arrival  Transfers Overall transfer level: Needs assistance Equipment used: Rolling walker (2 wheeled) Transfers: Sit to/from Stand Sit to Stand: Supervision            Ambulation/Gait Ambulation/Gait assistance: Supervision Gait Distance (Feet): 2 Feet Assistive device: Rolling walker (2 wheeled) Gait Pattern/deviations:  Step-through pattern;Decreased stride length;Trunk flexed Gait velocity: decreased   General Gait Details: Limited ambulation at bedside with walker and supervision assist.   Stairs            Wheelchair Mobility    Modified Rankin (Stroke Patients Only)       Balance Overall balance assessment: Needs assistance Sitting-balance support: Feet supported Sitting balance-Leahy Scale: Good     Standing balance support: Bilateral upper extremity supported Standing balance-Leahy Scale: Poor                               Pertinent Vitals/Pain Pain Assessment: No/denies pain    Home Living Family/patient expects to be discharged to:: Private residence Living Arrangements: Alone Available Help at Discharge: Neighbor;Family;Available PRN/intermittently(grandson, ex husband) Type of Home: Apartment Home Access: Ramped entrance     Home Layout: One level Home Equipment: Walker - 4 wheels      Prior Function Level of Independence: Independent with assistive device(s)         Comments: Uses Rollator for mobility, sponge bathes, ex husband drives her once every 2 weeks to grocery store and she mainly cooks microwaveable meals     Hand Dominance        Extremity/Trunk Assessment   Upper Extremity Assessment Upper Extremity Assessment: Overall WFL for tasks assessed    Lower Extremity Assessment Lower Extremity Assessment: Overall WFL for tasks assessed       Communication   Communication: No difficulties  Cognition Arousal/Alertness: Awake/alert Behavior During Therapy: WFL for tasks assessed/performed Overall Cognitive Status: Within Functional Limits  for tasks assessed                                        General Comments      Exercises     Assessment/Plan    PT Assessment Patient needs continued PT services  PT Problem List Decreased activity tolerance;Decreased balance;Decreased mobility;Cardiopulmonary status  limiting activity       PT Treatment Interventions DME instruction;Gait training;Functional mobility training;Therapeutic activities;Therapeutic exercise;Balance training;Patient/family education    PT Goals (Current goals can be found in the Care Plan section)  Acute Rehab PT Goals Patient Stated Goal: "breathe easier." PT Goal Formulation: With patient Time For Goal Achievement: 09/16/18 Potential to Achieve Goals: Good    Frequency Min 3X/week   Barriers to discharge Decreased caregiver support      Co-evaluation               AM-PAC PT "6 Clicks" Mobility  Outcome Measure Help needed turning from your back to your side while in a flat bed without using bedrails?: None Help needed moving from lying on your back to sitting on the side of a flat bed without using bedrails?: None Help needed moving to and from a bed to a chair (including a wheelchair)?: A Little Help needed standing up from a chair using your arms (e.g., wheelchair or bedside chair)?: None Help needed to walk in hospital room?: A Little Help needed climbing 3-5 steps with a railing? : A Lot 6 Click Score: 20    End of Session Equipment Utilized During Treatment: Gait belt;Oxygen Activity Tolerance: Other (comment)(limited by dyspnea) Patient left: in bed;with call bell/phone within reach Nurse Communication: Mobility status PT Visit Diagnosis: Difficulty in walking, not elsewhere classified (R26.2)    Time: 2641-5830 PT Time Calculation (min) (ACUTE ONLY): 17 min   Charges:   PT Evaluation $PT Eval Moderate Complexity: 1 Mod          Ellamae Sia, [place], DPT Acute Rehabilitation Services Pager [phone] Office [phone]   Willy Eddy 09/02/2018, 10:22 AM

## 2018-09-02 NOTE — Consult Note (Signed)
Chief Complaint: Patient was seen in consultation today for left chest tube placement Chief Complaint  Patient presents with  . COPD  . URI   at the request of Dr Despina Hick   Supervising Physician: Aletta Edouard  Patient Status: [facility] - In-pt  History of Present Illness: Donna Mcneil is a 78 y.o. female   COPD; Rh arthritis; Lupus To ED with cough/SOB Antibiotics and steroid no help ++ smoker Increasing SOB since at least Tgiving  CT yesterday:  IMPRESSION: 1. Suspect spiculated 19 x 16 mm anterior left upper lobe nodule, suspicious for primary bronchogenic malignancy. Fissural nodularity extends throughout the left interlobar fissure. Enlarged left hilar lymph node and prominent subcarinal node. Consider multi disciplinary pulmonary referral for further workup. 2. Moderate size partially loculated left pleural effusion with compressive atelectasis in the left lower and to a lesser extent upper lobes. 3. Small nodules in the right lung, including a 5 mm right apical nodule that is slightly irregular, nonspecific. 4. No pulmonary embolus. 5. Moderate emphysema. Areas of mucous plugging involves the left upper lobe and right lower lobes. 6. Advanced aortic atherosclerosis. Calcified plaque at the diaphragmatic hiatus causes luminal narrowing of greater than 50%.  PCCM attempted bedside thoracentesis--- no fluid Request made for left chest tube placement Dr Kathlene Cote has reviewed imaging and approves procedure for loculated left effusion    Past Medical History:  Diagnosis Date  . Diverticulitis   . Hypercholesteremia   . Hypertension   . Lupus ([place])   . Osteoporosis   . Tobacco use     Past Surgical History:  Procedure Laterality Date  . ABDOMINAL HYSTERECTOMY  12/1969  . APPENDECTOMY  1957  . CATARACT EXTRACTION    . CHOLECYSTECTOMY  1997  . LAPAROSCOPIC SIGMOID COLECTOMY  2013   secondary to diverticulitis  . spit seed removal    .  TONSILLECTOMY  1945    Allergies: Codeine; Cyclosporine; Other; Pregabalin; and Zolpidem  Medications: Prior to Admission medications   Medication Sig Start Date End Date Taking? Authorizing Provider  acetaminophen (TYLENOL) 500 MG tablet Take 1,000 mg by mouth every 6 (six) hours as needed for mild pain.    Yes [provider]  albuterol (PROVENTIL) (2.5 MG/3ML) 0.083% nebulizer solution USE 1 VIAL IN NEBULIZER AS DIRECTED EVERY 4 HOURS AS NEEDED Patient taking differently: Take 2.5 mg by nebulization every 4 (four) hours as needed for wheezing or shortness of breath.  07/23/18  Yes [name], [name], PA  ALPRAZolam (XANAX) 0.5 MG tablet TAKE 1 TABLET BY MOUTH TWICE A DAY AS NEEDED Patient taking differently: Take 0.75 mg by mouth at bedtime.  08/20/18  Yes [name], [name], PA  aspirin 81 MG tablet Take 81 mg by mouth daily.   Yes [provider]  atorvastatin (LIPITOR) 40 MG tablet TAKE 1 TABLET BY MOUTH ONCE DAILY Patient taking differently: Take 40 mg by mouth every evening.  05/14/18  Yes [name], [name], PA  betamethasone dipropionate (DIPROLENE) 0.05 % cream Apply 3.41 application topically 2 (two) times daily as needed (skin care).    Yes [provider]  BIOTIN PO Take 5,000 mcg by mouth daily.    Yes [provider]  Calcium Carbonate-Vitamin D (CALCIUM 600+D) 600-200 MG-UNIT TABS Take 1 tablet by mouth 2 (two) times daily.    Yes [provider]  Carboxymethylcellul-Glycerin (OPTIVE) 0.5-0.9 % SOLN Place 1 drop into both eyes 3 (three) times daily as needed for dry eyes.  Yes [provider]  carboxymethylcellulose (REFRESH TEARS) 0.5 % SOLN Place 1 drop into both eyes 3 (three) times daily as needed (dry eyes).    Yes [provider]  cetirizine (ZYRTEC) 10 MG tablet Take 10 mg by mouth daily.   Yes [provider]  clobetasol (TEMOVATE) 0.05 % external solution Apply 1 application topically daily as  needed (skin care).    Yes [provider]  Coenzyme Q10 (COQ10) 100 MG CAPS Take 100 mg by mouth daily.   Yes [provider]  docusate sodium (COLACE) 100 MG capsule Take 100 mg by mouth 2 (two) times daily.   Yes [provider]  folic acid (FOLVITE) 366 MCG tablet Take 1,000 mcg by mouth daily.    Yes [provider]  gabapentin (NEURONTIN) 100 MG capsule Take 100 mg by mouth 3 (three) times daily.  09/08/15  Yes [provider]  hydrochlorothiazide (HYDRODIURIL) 50 MG tablet TAKE 1 TABLET BY MOUTH ONCE A DAY Patient taking differently: Take 50 mg by mouth daily.  10/20/17  Yes [name], [name], PA  hydroxychloroquine (PLAQUENIL) 200 MG tablet Take 200 mg by mouth daily.  11/07/17 11/07/18 Yes [provider]  KRILL OIL PO Take 500 mg by mouth daily.    Yes [provider]  methotrexate (RHEUMATREX) 2.5 MG tablet Take 20 mg by mouth every Monday.  10/01/15  Yes [provider]  Multiple Vitamins-Minerals (MULTIVITAMIN ADULT PO) Take 1 tablet by mouth daily.    Yes [provider]  Omega 3 1000 MG CAPS Take 3 capsules by mouth daily.   Yes [provider]  Potassium 99 MG TABS Take 1 tablet by mouth daily.    Yes [provider]  PROAIR HFA 108 (90 Base) MCG/ACT inhaler INHALE 2 PUFFS EVERY 6 HOURS AS NEEDED Patient taking differently: Inhale 2 puffs into the lungs every 6 (six) hours as needed for wheezing or shortness of breath.  05/24/18  Yes [name], [name], PA  ranitidine (ZANTAC) 150 MG tablet Take 150 mg by mouth daily as needed for heartburn.    Yes [provider]  Vitamin D, Cholecalciferol, 1000 units TABS Take 1 tablet by mouth at bedtime.    Yes [provider]     Family History  Problem Relation Age of Onset  . Alcohol abuse Mother   . Cirrhosis Mother   . Heart disease Father   . Hypertension Father   . Gout Father   . Ulcers Father   . Hyperlipidemia Father    . Alcohol abuse Brother   . Heart disease Brother        MI at age 56  . OCD Son   . Prostatitis Paternal Grandfather   . Pneumonia Paternal Grandfather   . Cancer Son     Social History   Socioeconomic History  . Marital status: Divorced    Spouse name: Not on file  . Number of children: 2  . Years of education: Not on file  . Highest education level: High school graduate  Occupational History  . Occupation: retired  Scientific laboratory technician  . Financial resource strain: Not hard at all  . Food insecurity:    Worry: Never true    Inability: Never true  . Transportation needs:    Medical: No    Non-medical: No  Tobacco Use  . Smoking status: Current Every Day Smoker    Packs/day: 0.50    Types: Cigarettes  . Smokeless tobacco:  Never Used  . Tobacco comment: started smoking at age 54  Substance and Sexual Activity  . Alcohol use: No    Alcohol/week: 0.0 standard drinks  . Drug use: No  . Sexual activity: Not Currently  Lifestyle  . Physical activity:    Days per week: Not on file    Minutes per session: Not on file  . Stress: To some extent  Relationships  . Social connections:    Talks on phone: Not on file    Gets together: Not on file    Attends religious service: Not on file    Active member of club or organization: Not on file    Attends meetings of clubs or organizations: Not on file    Relationship status: Not on file  Other Topics Concern  . Not on file  Social History Narrative  . Not on file    Review of Systems: A 12 point ROS discussed and pertinent positives are indicated in the HPI above.  All other systems are negative.  Review of Systems  Constitutional: Positive for activity change, appetite change, fatigue and unexpected weight change. Negative for fever.  Respiratory: Positive for cough and shortness of breath.   Cardiovascular: Positive for chest pain.  Gastrointestinal: Negative for abdominal pain.  Neurological: Positive for weakness.    Psychiatric/Behavioral: Negative for behavioral problems and confusion.    Vital Signs: BP 139/89 (BP Location: Left Arm)   Pulse 96   Temp 98.2 F (36.8 C) (Oral)   Resp (!) 23   Ht 5\' 2"  (1.575 m)   Wt 145 lb 8 oz (66 kg) Comment: Scale A  SpO2 95%   BMI 26.61 kg/m   Physical Exam Vitals signs reviewed.  Cardiovascular:     Rate and Rhythm: Normal rate and regular rhythm.  Pulmonary:     Effort: Respiratory distress present.     Breath sounds: Wheezing present.  Abdominal:     General: Bowel sounds are normal.     Palpations: Abdomen is soft.  Musculoskeletal: Normal range of motion.  Skin:    General: Skin is warm and dry.  Neurological:     General: No focal deficit present.     Mental Status: She is alert and oriented to person, place, and time.  Psychiatric:        Mood and Affect: Mood normal.        Behavior: Behavior normal.        Thought Content: Thought content normal.        Judgment: Judgment normal.     Imaging: Ct Angio Chest Pe W/cm &/or Wo Cm  Result Date: 09/01/2018 CLINICAL DATA:  Shortness of breath. EXAM: CT ANGIOGRAPHY CHEST WITH CONTRAST TECHNIQUE: Multidetector CT imaging of the chest was performed using the standard protocol during bolus administration of intravenous contrast. Multiplanar CT image reconstructions and MIPs were obtained to evaluate the vascular anatomy. CONTRAST:  52 mL ISOVUE-370 IOPAMIDOL (ISOVUE-370) INJECTION 76% COMPARISON:  Chest radiograph 09/01/2018 FINDINGS: Cardiovascular: There are no filling defects within the pulmonary arteries to suggest pulmonary embolus. Diffuse atherosclerosis of the thoracic aorta without aneurysm or dissection. Conventional branching pattern from the aortic arch. There are coronary artery calcifications. Heart is normal in size. Small pericardial effusion measures up to 10 mm in depth. Mediastinum/Nodes: Prominent left hilar lymph nodes largest measuring up to 13 mm, image 65 series 5. Prominent  subcarinal node measures 12 mm short axis. Small high mediastinal nodes are not enlarged by size criteria. No  enlarged right hilar lymph nodes. No axillary adenopathy. Esophagus is decompressed. No visualized thyroid nodule. Lungs/Pleura: Moderate size left pleural effusion is partially loculated. Associated compressive atelectasis in the left lower and to a lesser extent upper lobes. Suspect spiculated 19 x 16 mm anterior left upper lobe nodule image 68 series 6, abutting the pleural surface. Fissural nodularity extends throughout the left interlobar fissure with possible tracking pleural fluid. Dominant fissural nodule measures 9 mm image 35 series 6. Moderate emphysema. There is narrowing of the left upper and lower lobe bronchi with mucous plugging in the lingula. Additional areas of mucous plugging in the right lower lobe. Right middle lobe pleural based nodule measures 4 mm, image 85 series 6. 5 mm right apical nodule, image 19. Additional tiny subpleural nodules in the right lung. Upper Abdomen: Motion artifact through the abdomen. Left adrenal thickening without dominant nodule. Atherosclerotic plaque at the diaphragmatic hiatus causes luminal narrowing of the aorta of greater than 50%. Musculoskeletal: No blastic or destructive lytic lesions. Review of the MIP images confirms the above findings. IMPRESSION: 1. Suspect spiculated 19 x 16 mm anterior left upper lobe nodule, suspicious for primary bronchogenic malignancy. Fissural nodularity extends throughout the left interlobar fissure. Enlarged left hilar lymph node and prominent subcarinal node. Consider multi disciplinary pulmonary referral for further workup. 2. Moderate size partially loculated left pleural effusion with compressive atelectasis in the left lower and to a lesser extent upper lobes. 3. Small nodules in the right lung, including a 5 mm right apical nodule that is slightly irregular, nonspecific. 4. No pulmonary embolus. 5. Moderate  emphysema. Areas of mucous plugging involves the left upper lobe and right lower lobes. 6. Advanced aortic atherosclerosis. Calcified plaque at the diaphragmatic hiatus causes luminal narrowing of greater than 50%. Aortic Atherosclerosis (ICD10-I70.0) and Emphysema (ICD10-J43.9). Electronically Signed   By: Keith Rake M.D.   On: 09/01/2018 22:40   Dg Chest Port 1 View  Result Date: 09/01/2018 CLINICAL DATA:  Short of breath. EXAM: PORTABLE CHEST 1 VIEW COMPARISON:  None. FINDINGS: Cardiac silhouette is partly obscured by left lung base opacity. Is grossly normal in size. No mediastinal or hilar masses. Opacity at the left lung base is consistent with a moderate pleural effusion with associated atelectasis or pneumonia. Lungs show prominent bronchovascular markings and mild lower lung zone interstitial thickening, presumed chronic. Mild scarring noted at the apices. No convincing right pleural effusion.  No pneumothorax. Skeletal structures are grossly intact. IMPRESSION: 1. Moderate left pleural effusion associated with left lung base atelectasis or pneumonia. 2. No other evidence of acute cardiopulmonary disease. Electronically Signed   By: Lajean Manes M.D.   On: 09/01/2018 18:49    Labs:  CBC: Recent Labs    01/09/18 1105 05/14/18 1225 09/01/18 1816 09/02/18 0315  WBC 7.6 7.2 8.0 10.0  HGB 15.1 15.1 13.8 12.5  HCT 43.3 41.0 39.9 35.4*  PLT 262 224 282 268    COAGS: Recent Labs    09/02/18 0031  INR 0.98  APTT 33    BMP: Recent Labs    01/09/18 1105 05/14/18 1225 09/01/18 1816 09/02/18 0315  NA 135 136 130* 131*  K 4.1 3.9 3.5 3.5  CL 95* 96 94* 95*  CO2 22 23 24 25   GLUCOSE 93 89 158* 136*  BUN 12 8 14 16   CALCIUM 10.3 9.9 9.4 8.9  CREATININE 0.65 0.76 0.75 0.76  GFRNONAA 86 76 >60 >60  GFRAA 99 88 >60 >60    LIVER FUNCTION TESTS:  Recent Labs    10/10/17 1110 01/09/18 1105 05/14/18 1225 09/02/18 0315  BILITOT 0.5 0.4 0.5 1.2  AST 20 21 23 17   ALT 22  21 24 18   ALKPHOS 55 57 57 43  PROT 7.7 7.3 7.5 6.8  ALBUMIN 4.6 4.5 4.5 2.9*    TUMOR MARKERS: No results for input(s): AFPTM, CEA, CA199, CHROMGRNA in the last 8760 hours.  Assessment and Plan:  Left loculated pleural effusion Unsuccessful bedside thoracentesis per PCCM Now scheduled for left chest tube placement Risks and benefits discussed with the patient including bleeding, infection, damage to adjacent structures, pneumothorax, and sepsis.  All of the patient's questions were answered, patient is agreeable to proceed. Consent signed and in chart.   Thank you for this interesting consult.  I greatly enjoyed meeting Donna Mcneil and look forward to participating in their care.  A copy of this report was sent to the requesting provider on this date.  Electronically Signed: Lavonia Drafts, PA-C 09/02/2018, 9:03 AM   I spent a total of 40 Minutes    in face to face in clinical consultation, greater than 50% of which was counseling/coordinating care for left chest tube placement

## 2018-09-02 NOTE — Plan of Care (Signed)
  Problem: Education: Goal: Knowledge of General Education information will improve Description Including pain rating scale, medication(s)/side effects and non-pharmacologic comfort measures Outcome: Progressing   Problem: Respiratory: Goal: Ability to maintain adequate ventilation will improve Outcome: Progressing   Problem: Clinical Measurements: Goal: Ability to maintain clinical measurements within normal limits will improve Outcome: Progressing   Problem: Activity: Goal: Risk for activity intolerance will decrease Outcome: Progressing   Problem: Pain Managment: Goal: General experience of comfort will improve Outcome: Progressing   Problem: Safety: Goal: Ability to remain free from injury will improve Outcome: Progressing

## 2018-09-02 NOTE — Progress Notes (Addendum)
PROGRESS NOTE    Donna Mcneil  PJK:[id number] DOB: 12/10/1940 DOA: 09/01/2018 PCP: [name], PA   Brief Narrative: Patient is a 78 year old female with past medical COPD, RA on methotrexate and Plaquenil, cutaneous lupus, coronary artery disease, peripheral antibiotic, hyperlipidemia, hypertension, tobacco abuse who presented with complaints of shortness of breath from home.  She reported cold-like symptoms for last 1 week.  Also was having productive cough with yellow sputum.  She felt outpatient antibiotic therapy with azithromycin.  No report of fever or chills at home.  Follows with rheumatology outpatient.Continues to smoke despite diagnosis of COPD.  Reported to have lost around 30 pounds over the last several months.  On presentation she was found to have Left-sided loculated pleural effusion with nodularity in the left lung and metastatic adenopathy.  Pulmonary consulted.  Attempted for bedside left-sided thoracentesis which failed to drain.  IR consulted.  Underwent CT-guided drain placement.  Assessment & Plan:   Active Problems:   Seropositive rheumatoid arthritis (HCC)   Hyperlipidemia   Hypertension   Tobacco use   Rheumatoid arthritis involving multiple sites with positive rheumatoid factor (HCC)   COPD exacerbation (HCC)   Acute respiratory failure (HCC)   Hyponatremia   Pleural effusion   Pulmonary nodule  Acute Respiratory failure with hypoxia: Multifactorial secondary to left-sided pleural effusion, COPD.  Continue to monitor respiratory status.  Currently stable._Saturating fine on supplemental oxygen.  Loculated left pleural effusion: Found to have moderate pleural effusion on the left side with nodularity and mediastinal adenopathy.  Rule out bronchogenic carcinoma.  She has history of rheumatoid arthritis which can also present with similar picture. Underwent CT-guided drain placement.  We will follow-up pleural fluid analysis including cytology,  culture. Continue broad so antibiotics for now. Echocardiogram also has been ordered.  COPD with acute exacerbation: Continue bronchodilators.  She is not on oxygen at home.  Continue supplemental oxygen as needed.  Continue oral prednisone.  Seropositive rheumatoid arthritis: On immunosuppressants at home.  On methotrexate and Plaquenil.  Currently on hold.  Hyperlipidemia: Continue home medication.  Hypertension: Currently blood pressure stable.  Continue home medications.  Tobacco abuse: Continues to smoke.  Counseled for cessation.  Debility/deconditioning:PT following.  Recommending home health         DVT prophylaxis:SCD Code Status: DNR Family Communication: None present at the bedside Disposition Plan: Home after full work-up   Consultants: Pulmonary, IR  Procedures: CT-guided left pleural drain  Antimicrobials: Vancomycin, cefepime  Subjective: Patient seen and examined at bedside this morning.  She was undergoing thoracentesis procedure at the bedside.  Denied any chest pain or shortness of breath.  Objective: Vitals:   09/02/18 1146 09/02/18 1151 09/02/18 1156 09/02/18 1202  BP: (!) 155/88 (!) 152/88 (!) 156/68 (!) 150/79  Pulse: 97 95 97 93  Resp: 16 14 15 16   Temp:      TempSrc:      SpO2: 97% 95% 96% 96%  Weight:      Height:        Intake/Output Summary (Last 24 hours) at 09/02/2018 1242 Last data filed at 09/02/2018 0300 Gross per 24 hour  Intake 1300 ml  Output -  Net 1300 ml   Filed Weights   09/01/18 1754 09/02/18 0534  Weight: 65.8 kg 66 kg    Examination:  General exam: Not in distress, elderly debilitated female HEENT:PERRL,Oral mucosa moist, Ear/Nose normal on gross exam Respiratory system: Decreased air entry on the left side, scattered wheezes Cardiovascular system: S1 &  S2 heard, RRR. No JVD, murmurs, rubs, gallops or clicks. No pedal edema. Gastrointestinal system: Abdomen is nondistended, soft and nontender. No organomegaly or  masses felt. Normal bowel sounds heard. Central nervous system: Alert and oriented. No focal neurological deficits. Extremities: No edema, no clubbing ,no cyanosis, distal peripheral pulses palpable. Skin: No rashes, lesions or ulcers,no icterus ,no pallor MSK: Normal muscle bulk,tone ,power Psychiatry: Judgement and insight appear normal. Mood & affect appropriate.     Data Reviewed: I have personally reviewed following labs and imaging studies  CBC: Recent Labs  Lab 09/01/18 1816 09/02/18 0315  WBC 8.0 10.0  NEUTROABS 6.9  --   HGB 13.8 12.5  HCT 39.9 35.4*  MCV 91.7 89.6  PLT 282 096   Basic Metabolic Panel: Recent Labs  Lab 09/01/18 1816 09/02/18 0315  NA 130* 131*  K 3.5 3.5  CL 94* 95*  CO2 24 25  GLUCOSE 158* 136*  BUN 14 16  CREATININE 0.75 0.76  CALCIUM 9.4 8.9  MG  --  2.1  PHOS  --  3.8   GFR: Estimated Creatinine Clearance: 51.7 mL/min (by C-G formula based on SCr of 0.76 mg/dL). Liver Function Tests: Recent Labs  Lab 09/02/18 0315 09/02/18 1013  AST 17  --   ALT 18  --   ALKPHOS 43  --   BILITOT 1.2  --   PROT 6.8 7.2  ALBUMIN 2.9* 3.0*   No results for input(s): LIPASE, AMYLASE in the last 168 hours. No results for input(s): AMMONIA in the last 168 hours. Coagulation Profile: Recent Labs  Lab 09/02/18 0031  INR 0.98   Cardiac Enzymes: Recent Labs  Lab 09/01/18 2019 09/02/18 0315 09/02/18 1013  TROPONINI <0.03 <0.03 <0.03   BNP (last 3 results) No results for input(s): PROBNP in the last 8760 hours. HbA1C: No results for input(s): HGBA1C in the last 72 hours. CBG: No results for input(s): GLUCAP in the last 168 hours. Lipid Profile: No results for input(s): CHOL, HDL, LDLCALC, TRIG, CHOLHDL, LDLDIRECT in the last 72 hours. Thyroid Function Tests: Recent Labs    09/02/18 0315 09/02/18 1013  TSH 0.225*  --   FREET4  --  1.21   Anemia Panel: No results for input(s): VITAMINB12, FOLATE, FERRITIN, TIBC, IRON, RETICCTPCT in  the last 72 hours. Sepsis Labs: Recent Labs  Lab 09/02/18 0031 09/02/18 0315  PROCALCITON <0.10  --   LATICACIDVEN 2.9* 1.1    Recent Results (from the past 240 hour(s))  Blood culture (routine x 2)     Status: None (Preliminary result)   Collection Time: 09/01/18  8:35 PM  Result Value Ref Range Status   Specimen Description BLOOD RIGHT ANTECUBITAL  Final   Special Requests   Final    BOTTLES DRAWN AEROBIC AND ANAEROBIC Blood Culture adequate volume   Culture NO GROWTH < 12 HOURS  Final   Report Status PENDING  Incomplete  Blood culture (routine x 2)     Status: None (Preliminary result)   Collection Time: 09/01/18  9:27 PM  Result Value Ref Range Status   Specimen Description BLOOD RIGHT HAND  Final   Special Requests   Final    BOTTLES DRAWN AEROBIC AND ANAEROBIC Blood Culture adequate volume   Culture NO GROWTH < 12 HOURS  Final   Report Status PENDING  Incomplete  Respiratory Panel by PCR     Status: None   Collection Time: 09/02/18  1:19 AM  Result Value Ref Range Status   Adenovirus  NOT DETECTED NOT DETECTED Final   Coronavirus 229E NOT DETECTED NOT DETECTED Final   Coronavirus HKU1 NOT DETECTED NOT DETECTED Final   Coronavirus NL63 NOT DETECTED NOT DETECTED Final   Coronavirus OC43 NOT DETECTED NOT DETECTED Final   Metapneumovirus NOT DETECTED NOT DETECTED Final   Rhinovirus / Enterovirus NOT DETECTED NOT DETECTED Final   Influenza A NOT DETECTED NOT DETECTED Final   Influenza B NOT DETECTED NOT DETECTED Final   Parainfluenza Virus 1 NOT DETECTED NOT DETECTED Final   Parainfluenza Virus 2 NOT DETECTED NOT DETECTED Final   Parainfluenza Virus 3 NOT DETECTED NOT DETECTED Final   Parainfluenza Virus 4 NOT DETECTED NOT DETECTED Final   Respiratory Syncytial Virus NOT DETECTED NOT DETECTED Final   Bordetella pertussis NOT DETECTED NOT DETECTED Final   Chlamydophila pneumoniae NOT DETECTED NOT DETECTED Final   Mycoplasma pneumoniae NOT DETECTED NOT DETECTED Final    MRSA PCR Screening     Status: None   Collection Time: 09/02/18  1:19 AM  Result Value Ref Range Status   MRSA by PCR NEGATIVE NEGATIVE Final    Comment:        The GeneXpert MRSA Assay (FDA approved for NASAL specimens only), is one component of a comprehensive MRSA colonization surveillance program. It is not intended to diagnose MRSA infection nor to guide or monitor treatment for MRSA infections. Performed at [facility], [place] [address] [place], [place] [address]          Radiology Studies: Ct Angio Chest Pe W/cm &/or Wo Cm  Result Date: 09/01/2018 CLINICAL DATA:  Shortness of breath. EXAM: CT ANGIOGRAPHY CHEST WITH CONTRAST TECHNIQUE: Multidetector CT imaging of the chest was performed using the standard protocol during bolus administration of intravenous contrast. Multiplanar CT image reconstructions and MIPs were obtained to evaluate the vascular anatomy. CONTRAST:  52 mL ISOVUE-370 IOPAMIDOL (ISOVUE-370) INJECTION 76% COMPARISON:  Chest radiograph 09/01/2018 FINDINGS: Cardiovascular: There are no filling defects within the pulmonary arteries to suggest pulmonary embolus. Diffuse atherosclerosis of the thoracic aorta without aneurysm or dissection. Conventional branching pattern from the aortic arch. There are coronary artery calcifications. Heart is normal in size. Small pericardial effusion measures up to 10 mm in depth. Mediastinum/Nodes: Prominent left hilar lymph nodes largest measuring up to 13 mm, image 65 series 5. Prominent subcarinal node measures 12 mm short axis. Small high mediastinal nodes are not enlarged by size criteria. No enlarged right hilar lymph nodes. No axillary adenopathy. Esophagus is decompressed. No visualized thyroid nodule. Lungs/Pleura: Moderate size left pleural effusion is partially loculated. Associated compressive atelectasis in the left lower and to a lesser extent upper lobes. Suspect spiculated 19 x 16 mm anterior left upper lobe nodule  image 68 series 6, abutting the pleural surface. Fissural nodularity extends throughout the left interlobar fissure with possible tracking pleural fluid. Dominant fissural nodule measures 9 mm image 35 series 6. Moderate emphysema. There is narrowing of the left upper and lower lobe bronchi with mucous plugging in the lingula. Additional areas of mucous plugging in the right lower lobe. Right middle lobe pleural based nodule measures 4 mm, image 85 series 6. 5 mm right apical nodule, image 19. Additional tiny subpleural nodules in the right lung. Upper Abdomen: Motion artifact through the abdomen. Left adrenal thickening without dominant nodule. Atherosclerotic plaque at the diaphragmatic hiatus causes luminal narrowing of the aorta of greater than 50%. Musculoskeletal: No blastic or destructive lytic lesions. Review of the MIP images confirms the above findings.  IMPRESSION: 1. Suspect spiculated 19 x 16 mm anterior left upper lobe nodule, suspicious for primary bronchogenic malignancy. Fissural nodularity extends throughout the left interlobar fissure. Enlarged left hilar lymph node and prominent subcarinal node. Consider multi disciplinary pulmonary referral for further workup. 2. Moderate size partially loculated left pleural effusion with compressive atelectasis in the left lower and to a lesser extent upper lobes. 3. Small nodules in the right lung, including a 5 mm right apical nodule that is slightly irregular, nonspecific. 4. No pulmonary embolus. 5. Moderate emphysema. Areas of mucous plugging involves the left upper lobe and right lower lobes. 6. Advanced aortic atherosclerosis. Calcified plaque at the diaphragmatic hiatus causes luminal narrowing of greater than 50%. Aortic Atherosclerosis (ICD10-I70.0) and Emphysema (ICD10-J43.9). Electronically Signed   By: Keith Rake M.D.   On: 09/01/2018 22:40   Dg Chest Port 1 View  Result Date: 09/01/2018 CLINICAL DATA:  Short of breath. EXAM: PORTABLE  CHEST 1 VIEW COMPARISON:  None. FINDINGS: Cardiac silhouette is partly obscured by left lung base opacity. Is grossly normal in size. No mediastinal or hilar masses. Opacity at the left lung base is consistent with a moderate pleural effusion with associated atelectasis or pneumonia. Lungs show prominent bronchovascular markings and mild lower lung zone interstitial thickening, presumed chronic. Mild scarring noted at the apices. No convincing right pleural effusion.  No pneumothorax. Skeletal structures are grossly intact. IMPRESSION: 1. Moderate left pleural effusion associated with left lung base atelectasis or pneumonia. 2. No other evidence of acute cardiopulmonary disease. Electronically Signed   By: Lajean Manes M.D.   On: 09/01/2018 18:49        Scheduled Meds: . ALPRAZolam  0.75 mg Oral QHS  . atorvastatin  40 mg Oral QPM  . famotidine  10 mg Oral Daily  . gabapentin  100 mg Oral TID  . ipratropium-albuterol  3 mL Nebulization QID  . lidocaine      . loratadine  10 mg Oral Daily  . [START ON 09/03/2018] predniSONE  40 mg Oral Q breakfast   Continuous Infusions: . ceFEPime (MAXIPIME) IV 1 g (09/02/18 0530)  . vancomycin Stopped (09/02/18 0150)     LOS: 1 day    Time spent: 35 mins.More than 50% of that time was spent in counseling and/or coordination of care.      Shelly Coss, MD [facility] Pager [phone]  If 7PM-7AM, please contact night-coverage [email or web address] Password [facility] 09/02/2018, 12:42 PM

## 2018-09-03 ENCOUNTER — Inpatient Hospital Stay (HOSPITAL_COMMUNITY): Payer: Medicare Other

## 2018-09-03 DIAGNOSIS — I361 Nonrheumatic tricuspid (valve) insufficiency: Secondary | ICD-10-CM

## 2018-09-03 DIAGNOSIS — R600 Localized edema: Secondary | ICD-10-CM

## 2018-09-03 LAB — BASIC METABOLIC PANEL
Anion gap: 10 (ref 5–15)
BUN: 20 mg/dL (ref 8–23)
CO2: 23 mmol/L (ref 22–32)
Calcium: 8.3 mg/dL — ABNORMAL LOW (ref 8.9–10.3)
Chloride: 99 mmol/L (ref 98–111)
Creatinine, Ser: 0.67 mg/dL (ref 0.44–1.00)
GFR calc Af Amer: >60 mL/min (ref >60)
GFR calc non Af Amer: >60 mL/min (ref >60)
Glucose, Bld: 128 mg/dL — ABNORMAL HIGH (ref 70–99)
Potassium: 4 mmol/L (ref 3.5–5.1)
Sodium: 132 mmol/L — ABNORMAL LOW (ref 135–145)

## 2018-09-03 LAB — T3, FREE: T3, Free: 2.3 pg/mL (ref 2.0–4.4)

## 2018-09-03 LAB — EXPECTORATED SPUTUM ASSESSMENT W GRAM STAIN, RFLX TO RESP C

## 2018-09-03 LAB — ECHOCARDIOGRAM COMPLETE
Height: 62 in
Weight: 2328 oz

## 2018-09-03 MED ORDER — METHYLPREDNISOLONE SODIUM SUCC 40 MG IJ SOLR
40.0000 mg | Freq: Two times a day (BID) | INTRAMUSCULAR | Status: DC
  Administered 2018-09-03 – 2018-09-06 (×7): 40 mg via INTRAVENOUS
  Filled 2018-09-03 (×7): qty 1

## 2018-09-03 MED ORDER — GUAIFENESIN ER 600 MG PO TB12
600.0000 mg | ORAL_TABLET | Freq: Two times a day (BID) | ORAL | Status: DC
  Administered 2018-09-03 – 2018-09-06 (×7): 600 mg via ORAL
  Filled 2018-09-03 (×7): qty 1

## 2018-09-03 MED ORDER — ENSURE ENLIVE PO LIQD
237.0000 mL | Freq: Two times a day (BID) | ORAL | Status: DC
  Administered 2018-09-03 – 2018-09-05 (×5): 237 mL via ORAL

## 2018-09-03 MED ORDER — ADULT MULTIVITAMIN W/MINERALS CH
1.0000 | ORAL_TABLET | Freq: Every day | ORAL | Status: DC
  Administered 2018-09-04 – 2018-09-06 (×3): 1 via ORAL
  Filled 2018-09-03 (×4): qty 1

## 2018-09-03 MED ORDER — IPRATROPIUM-ALBUTEROL 0.5-2.5 (3) MG/3ML IN SOLN
3.0000 mL | RESPIRATORY_TRACT | Status: DC
  Administered 2018-09-03 – 2018-09-06 (×20): 3 mL via RESPIRATORY_TRACT
  Filled 2018-09-03 (×18): qty 3

## 2018-09-03 MED ORDER — FLUTICASONE PROPIONATE 50 MCG/ACT NA SUSP
1.0000 | Freq: Every day | NASAL | Status: DC
  Administered 2018-09-03 – 2018-09-05 (×3): 1 via NASAL
  Filled 2018-09-03: qty 16

## 2018-09-03 MED ORDER — IPRATROPIUM-ALBUTEROL 0.5-2.5 (3) MG/3ML IN SOLN
3.0000 mL | RESPIRATORY_TRACT | Status: DC
  Filled 2018-09-03: qty 3

## 2018-09-03 MED ORDER — AZITHROMYCIN 500 MG PO TABS
500.0000 mg | ORAL_TABLET | Freq: Every day | ORAL | Status: DC
  Administered 2018-09-03: 500 mg via ORAL
  Filled 2018-09-03: qty 1

## 2018-09-03 NOTE — Telephone Encounter (Signed)
LMTCB

## 2018-09-03 NOTE — Progress Notes (Signed)
  Echocardiogram 2D Echocardiogram has been performed.  Bobbye Charleston 09/03/2018, 2:57 PM

## 2018-09-03 NOTE — Progress Notes (Signed)
Pt complained having shortness of breath. Auscultated expiratory and inspiratory wheezing both lungs. RT notified. Breathing treatment given. Will continue to monitor.  Kennyth Lose, BSN, RN,PCCN-CMC

## 2018-09-03 NOTE — Progress Notes (Signed)
Bilateral lower extremity venous duplex has been completed. Preliminary results can be found in CV Proc through chart review.   09/03/18 5:45 PM Carlos Levering RVT

## 2018-09-03 NOTE — Plan of Care (Signed)
  Problem: Clinical Measurements: Goal: Respiratory complications will improve Outcome: Progressing from shortness of breath after breathing treatment given by RT.

## 2018-09-03 NOTE — Progress Notes (Addendum)
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  Enbrel was held about a year ago and she has been on Plaquenil.  She was recently treated with azithromycin and prednisone. CT chest showed loculated effusion. She underwent CT-guided drainage 120 cc of bloody fluid removed and catheter left in the pleural space, this is drained about 700 cc of bloody fluid overnight   Pleural fluid results were reviewed and this shows a lymphocytic exudate, cytology is pending  She feels mildly improved On exam-afebrile, coarse bilateral breath sounds with scattered rhonchi, no crackles, S1-S2 normal, alert and interactive no asterixis.  Chest x-ray was personally reviewed which shows catheter in the left pleural space  Impression  Left loculated pleural effusion-lymphocytic exudate, await cytology Glucose is normal so this is not typical for rheumatoid effusion, does not appear to be parapneumonic either Continue chest tube to drainage until less than 150 cc   Severe COPD-spirometry and 2018 showed FEV1 of 23% We will treat as exacerbation, continue Solu-Medrol 40 every 12 and duo nebs Empiric cefepime ordered by primary service  PCCM to follow   Kara Mead MD. FCCP. [facility] Pager [phone] If no response call [phone]   09/03/2018

## 2018-09-03 NOTE — Progress Notes (Signed)
Referring Physician(s): Dr. Lake Bells  Supervising Physician: Arne Cleveland  Patient Status:  [facility] - In-pt  Chief Complaint: Follow up left chest tube placed 09/02/18 by Dr. Kathlene Cote  Subjective:  Patient seated on the edge of her bed holding emesis bag, states she was dry heaving about 30 minutes ago after doing exercises because she "felt like my mucous was coming up." She states she's feeling better now though. She reports improvement in her breathing from yesterday although it's still not back to normal and she can feel it worsening about 30 minutes before her breathing treatment is due. She reports little appetite and is going to try ensure soon. She denies complaints regarding the chest tube.   Allergies: Codeine; Cyclosporine; Other; Pregabalin; and Zolpidem  Medications: Prior to Admission medications   Medication Sig Start Date End Date Taking? Authorizing Provider  acetaminophen (TYLENOL) 500 MG tablet Take 1,000 mg by mouth every 6 (six) hours as needed for mild pain.    Yes [provider]  albuterol (PROVENTIL) (2.5 MG/3ML) 0.083% nebulizer solution USE 1 VIAL IN NEBULIZER AS DIRECTED EVERY 4 HOURS AS NEEDED Patient taking differently: Take 2.5 mg by nebulization every 4 (four) hours as needed for wheezing or shortness of breath.  07/23/18  Yes [name], [name], PA  ALPRAZolam (XANAX) 0.5 MG tablet TAKE 1 TABLET BY MOUTH TWICE A DAY AS NEEDED Patient taking differently: Take 0.75 mg by mouth at bedtime.  08/20/18  Yes [name], [name], PA  aspirin 81 MG tablet Take 81 mg by mouth daily.   Yes [provider]  atorvastatin (LIPITOR) 40 MG tablet TAKE 1 TABLET BY MOUTH ONCE DAILY Patient taking differently: Take 40 mg by mouth every evening.  05/14/18  Yes [name], [name], PA  betamethasone dipropionate (DIPROLENE) 0.05 % cream Apply 3.30 application topically 2 (two) times daily as needed (skin care).    Yes [provider]  BIOTIN PO Take  5,000 mcg by mouth daily.    Yes [provider]  Calcium Carbonate-Vitamin D (CALCIUM 600+D) 600-200 MG-UNIT TABS Take 1 tablet by mouth 2 (two) times daily.    Yes [provider]  Carboxymethylcellul-Glycerin (OPTIVE) 0.5-0.9 % SOLN Place 1 drop into both eyes 3 (three) times daily as needed for dry eyes.    Yes [provider]  carboxymethylcellulose (REFRESH TEARS) 0.5 % SOLN Place 1 drop into both eyes 3 (three) times daily as needed (dry eyes).    Yes [provider]  cetirizine (ZYRTEC) 10 MG tablet Take 10 mg by mouth daily.   Yes [provider]  clobetasol (TEMOVATE) 0.05 % external solution Apply 1 application topically daily as needed (skin care).    Yes [provider]  Coenzyme Q10 (COQ10) 100 MG CAPS Take 100 mg by mouth daily.   Yes [provider]  docusate sodium (COLACE) 100 MG capsule Take 100 mg by mouth 2 (two) times daily.   Yes [provider]  folic acid (FOLVITE) 076 MCG tablet Take 1,000 mcg by mouth daily.    Yes [provider]  gabapentin (NEURONTIN) 100 MG capsule Take 100 mg by mouth 3 (three) times daily.  09/08/15  Yes [provider]  hydrochlorothiazide (HYDRODIURIL) 50 MG tablet TAKE 1 TABLET BY MOUTH ONCE A DAY Patient taking differently: Take 50 mg by mouth daily.  10/20/17  Yes [name], [name], PA  hydroxychloroquine (PLAQUENIL) 200 MG tablet Take 200 mg by mouth daily.  11/07/17 11/07/18 Yes [provider]  KRILL OIL PO Take 500 mg by mouth daily.    Yes [provider]  methotrexate (RHEUMATREX) 2.5 MG tablet Take 20 mg by mouth every Monday.  10/01/15  Yes [provider]  Multiple Vitamins-Minerals (MULTIVITAMIN ADULT PO) Take 1 tablet by mouth daily.    Yes [provider]  Omega 3 1000 MG CAPS Take 3 capsules by mouth daily.   Yes [provider]  Potassium 99 MG TABS Take 1 tablet by mouth daily.    Yes [provider]  PROAIR HFA 108 (90 Base) MCG/ACT inhaler INHALE 2 PUFFS EVERY 6 HOURS AS NEEDED Patient taking differently: Inhale 2 puffs into the lungs every 6 (six) hours as needed for wheezing or shortness of breath.  05/24/18  Yes [name], [name], PA  ranitidine (ZANTAC) 150 MG tablet Take 150 mg by mouth daily as needed for heartburn.    Yes [provider]  Vitamin D, Cholecalciferol, 1000 units TABS Take 1 tablet by mouth at bedtime.    Yes [provider]     Vital Signs: BP (!) 159/95   Pulse (!) 127   Temp 97.8 F (36.6 C) (Oral)   Resp (!) 31   Ht 5\' 2"  (1.575 m)   Wt 145 lb 8 oz (66 kg) Comment: Scale A  SpO2 94%   BMI 26.61 kg/m   Physical Exam Vitals signs and nursing note reviewed.  Constitutional:      General: She is not in acute distress.    Appearance: She is ill-appearing.  HENT:     Head: Normocephalic and atraumatic.  Cardiovascular:     Rate and Rhythm: Regular rhythm. Tachycardia present.  Pulmonary:     Effort: Pulmonary effort is normal. No respiratory distress.     Breath sounds: Wheezing (bilaterally) present.     Comments: O2 via [place]. Left chest tube insertion site clean, dry, intact without active bleeding - no pain on palpation. 700 cc serosanguineous OP in pleurvac, remains to -20 cm H2O suction.  Chest:     Chest wall: No tenderness.  Skin:    General: Skin is warm and dry.  Neurological:     Mental Status: She is alert. Mental status is at baseline.     Imaging: Ct Angio Chest Pe W/cm &/or Wo Cm  Result Date: 09/01/2018 CLINICAL DATA:  Shortness of breath. EXAM: CT ANGIOGRAPHY CHEST WITH CONTRAST TECHNIQUE: Multidetector CT imaging of the chest was performed using the standard protocol during bolus administration of intravenous contrast. Multiplanar CT image reconstructions and MIPs were obtained to evaluate the vascular anatomy. CONTRAST:  52 mL ISOVUE-370 IOPAMIDOL (ISOVUE-370) INJECTION 76% COMPARISON:  Chest  radiograph 09/01/2018 FINDINGS: Cardiovascular: There are no filling defects within the pulmonary arteries to suggest pulmonary embolus. Diffuse atherosclerosis of the thoracic aorta without aneurysm or dissection. Conventional branching pattern from the aortic arch. There are coronary artery calcifications. Heart is normal in size. Small pericardial effusion measures up to 10 mm in depth. Mediastinum/Nodes: Prominent left hilar lymph nodes largest measuring up to 13 mm, image 65 series 5. Prominent subcarinal node measures 12 mm short axis. Small high mediastinal nodes are not enlarged by size criteria. No enlarged right hilar lymph nodes. No axillary adenopathy. Esophagus is decompressed. No visualized thyroid nodule. Lungs/Pleura: Moderate size left pleural effusion is partially loculated. Associated compressive atelectasis in the left lower and to a lesser extent upper lobes. Suspect spiculated 19 x 16 mm anterior left upper lobe nodule  image 68 series 6, abutting the pleural surface. Fissural nodularity extends throughout the left interlobar fissure with possible tracking pleural fluid. Dominant fissural nodule measures 9 mm image 35 series 6. Moderate emphysema. There is narrowing of the left upper and lower lobe bronchi with mucous plugging in the lingula. Additional areas of mucous plugging in the right lower lobe. Right middle lobe pleural based nodule measures 4 mm, image 85 series 6. 5 mm right apical nodule, image 19. Additional tiny subpleural nodules in the right lung. Upper Abdomen: Motion artifact through the abdomen. Left adrenal thickening without dominant nodule. Atherosclerotic plaque at the diaphragmatic hiatus causes luminal narrowing of the aorta of greater than 50%. Musculoskeletal: No blastic or destructive lytic lesions. Review of the MIP images confirms the above findings. IMPRESSION: 1. Suspect spiculated 19 x 16 mm anterior left upper lobe nodule, suspicious for primary bronchogenic  malignancy. Fissural nodularity extends throughout the left interlobar fissure. Enlarged left hilar lymph node and prominent subcarinal node. Consider multi disciplinary pulmonary referral for further workup. 2. Moderate size partially loculated left pleural effusion with compressive atelectasis in the left lower and to a lesser extent upper lobes. 3. Small nodules in the right lung, including a 5 mm right apical nodule that is slightly irregular, nonspecific. 4. No pulmonary embolus. 5. Moderate emphysema. Areas of mucous plugging involves the left upper lobe and right lower lobes. 6. Advanced aortic atherosclerosis. Calcified plaque at the diaphragmatic hiatus causes luminal narrowing of greater than 50%. Aortic Atherosclerosis (ICD10-I70.0) and Emphysema (ICD10-J43.9). Electronically Signed   By: Keith Rake M.D.   On: 09/01/2018 22:40   Dg Chest Port 1 View  Result Date: 09/01/2018 CLINICAL DATA:  Short of breath. EXAM: PORTABLE CHEST 1 VIEW COMPARISON:  None. FINDINGS: Cardiac silhouette is partly obscured by left lung base opacity. Is grossly normal in size. No mediastinal or hilar masses. Opacity at the left lung base is consistent with a moderate pleural effusion with associated atelectasis or pneumonia. Lungs show prominent bronchovascular markings and mild lower lung zone interstitial thickening, presumed chronic. Mild scarring noted at the apices. No convincing right pleural effusion.  No pneumothorax. Skeletal structures are grossly intact. IMPRESSION: 1. Moderate left pleural effusion associated with left lung base atelectasis or pneumonia. 2. No other evidence of acute cardiopulmonary disease. Electronically Signed   By: Lajean Manes M.D.   On: 09/01/2018 18:49   Ct Image Guided Drainage By Percutaneous Catheter  Result Date: 09/02/2018 CLINICAL DATA:  Loculated left pleural effusion and left upper lobe pulmonary nodule. There has been inability to obtain pleural fluid with attempted  bedside thoracentesis. Request has been made to place a pleural drainage catheter. EXAM: CT GUIDED LEFT PLEURAL DRAINAGE CATHETER PLACEMENT ANESTHESIA/SEDATION: 4.0 mg IV Versed 100 mcg IV Fentanyl Total Moderate Sedation Time:  24 minutes The patient's level of consciousness and physiologic status were continuously monitored during the procedure by Radiology nursing. PROCEDURE: The procedure, risks, benefits, and alternatives were explained to the patient. Questions regarding the procedure were encouraged and answered. The patient understands and consents to the procedure. A time out was performed prior to initiating the procedure. CT was performed in a supine position with the left side rolled up. The left lateral chest wall was prepped with chlorhexidine in a sterile fashion, and a sterile drape was applied covering the operative field. A sterile gown and sterile gloves were used for the procedure. Local anesthesia was provided with 1% Lidocaine. Under CT guidance, an 18 gauge trocar needle was advanced  into the left pleural space. Fluid was aspirated. A guidewire was advanced in the needle removed. The percutaneous tract was dilated over the guidewire and a 12 French pigtail drainage catheter placed over the wire. Catheter position was confirmed by CT. Additional fluid was aspirated and sent for requested laboratory testing. The catheter was then connected to a [place] Pleur-evac and wall suction at -20 cm water. COMPLICATIONS: None FINDINGS: Initial aspiration yielded grossly bloody left pleural fluid. After placement of the pigtail pleural drain, there is good return of bloody fluid. A sample of 120 mL of fluid was sent for requested laboratory testing. IMPRESSION: CT-guided placement of left pleural drainage catheter for drainage of a loculated left pleural effusion. There was return of grossly bloody pleural fluid. A 12 French catheter was placed and attached to a Pleur-evac device and wall suction at -20 cm  of water. A sample of fluid was sent for requested laboratory testing. Electronically Signed   By: Aletta Edouard M.D.   On: 09/02/2018 12:50    Labs:  CBC: Recent Labs    01/09/18 1105 05/14/18 1225 09/01/18 1816 09/02/18 0315  WBC 7.6 7.2 8.0 10.0  HGB 15.1 15.1 13.8 12.5  HCT 43.3 41.0 39.9 35.4*  PLT 262 224 282 268    COAGS: Recent Labs    09/02/18 0031  INR 0.98  APTT 33    BMP: Recent Labs    05/14/18 1225 09/01/18 1816 09/02/18 0315 09/03/18 0345  NA 136 130* 131* 132*  K 3.9 3.5 3.5 4.0  CL 96 94* 95* 99  CO2 23 24 25 23   GLUCOSE 89 158* 136* 128*  BUN 8 14 16 20   CALCIUM 9.9 9.4 8.9 8.3*  CREATININE 0.76 0.75 0.76 0.67  GFRNONAA 76 >60 >60 >60  GFRAA 88 >60 >60 >60    LIVER FUNCTION TESTS: Recent Labs    10/10/17 1110 01/09/18 1105 05/14/18 1225 09/02/18 0315 09/02/18 1013  BILITOT 0.5 0.4 0.5 1.2  --   AST 20 21 23 17   --   ALT 22 21 24 18   --   ALKPHOS 55 57 57 43  --   PROT 7.7 7.3 7.5 6.8 7.2  ALBUMIN 4.6 4.5 4.5 2.9* 3.0*    Assessment and Plan:  Patient s/p left side chest tube placement 09/02/18 for loculated left pleural effusion - patient reports some improvement in symptoms. Remains on supplemental O2 via [place].   Left chest tube insertion site unremarkable, 700 cc serosanguineous OP in pleur vac, remains to wall suction.  Chest tube to remain to suction for now - will order follow up CXR today. IR will continue to follow.   Please call with questions or concerns.   Electronically Signed: Joaquim Nam, PA-C 09/03/2018, 12:32 PM   I spent a total of 15 Minutes at the the patient's bedside AND on the patient's [facility] floor or unit, greater than 50% of which was counseling/coordinating care for left chest tube placement.

## 2018-09-03 NOTE — Progress Notes (Addendum)
PROGRESS NOTE    Donna Mcneil  JIR:[id number] DOB: 03/02/1941 DOA: 09/01/2018 PCP: [name], PA   Brief Narrative: Patient is a 78 year old female with past medical COPD, RA on methotrexate and Plaquenil, cutaneous lupus, coronary artery disease, peripheral antibiotic, hyperlipidemia, hypertension, tobacco abuse who presented with complaints of shortness of breath from home.  She reported cold-like symptoms for last 1 week.  Also was having productive cough with yellow sputum.  She felt outpatient antibiotic therapy with azithromycin.  No report of fever or chills at home.  Follows with rheumatology outpatient.Continues to smoke despite diagnosis of COPD.  Reported to have lost around 30 pounds over the last several months.  On presentation she was found to have Left-sided loculated pleural effusion with nodularity in the left lung and metastatic adenopathy.  Pulmonary consulted.  Attempted for bedside left-sided thoracentesis which failed to drain.  IR consulted.  Underwent CT-guided drain placement on 09/02/18.  Assessment & Plan:   Active Problems:   Seropositive rheumatoid arthritis (HCC)   Hyperlipidemia   Hypertension   Tobacco use   Rheumatoid arthritis involving multiple sites with positive rheumatoid factor (HCC)   COPD exacerbation (HCC)   Acute respiratory failure (HCC)   Hyponatremia   Pleural effusion   Pulmonary nodule  Acute Respiratory failure with hypoxia: Multifactorial secondary to left-sided pleural effusion, COPD.  Continue to monitor respiratory status.  Saturating fine on supplemental oxygen.  Loculated left pleural effusion: Found to have moderate pleural effusion on the left side with nodularity and mediastinal adenopathy.  Rule out bronchogenic carcinoma.  She has history of rheumatoid arthritis which can also present with similar picture. Underwent CT-guided drain placement by IR on 09/02/18.  We will follow-up pleural fluid analysis including  cytology, culture.Fluid analysis shows that is exudative in nature.  Gram stain did not show any organism Continue broad spectrum  antibiotics for now. Echocardiogram also has been ordered.  COPD with acute exacerbation: To have bilateral severe expiratory wheezes today.  Started on IV Solu-Medrol.  Continue Mucinex.Continue bronchodilators.  She is not on oxygen at home.  Continue supplemental oxygen as needed.    Seropositive rheumatoid arthritis: On immunosuppressants at home.  On methotrexate and Plaquenil.  Currently on hold.  Hyperlipidemia: Continue home medication.  Hypertension: Currently blood pressure stable.  Continue home medications.  Tobacco abuse: Continues to smoke.  Counseled for cessation.  Debility/deconditioning:PT following.  Recommending home health  Nutrition Problem: Inadequate oral intake Etiology: acute illness      DVT prophylaxis:SCD Code Status: DNR Family Communication: None present at the bedside Disposition Plan: Home after full work-up   Consultants: Pulmonary, IR  Procedures: CT-guided left pleural drain  Antimicrobials: Vancomycin, cefepime  Subjective: Patient seen and examined at bedside this morning.  Complains  of shortness of breath this morning.  Also having bothering cough. Objective: Vitals:   09/03/18 0700 09/03/18 0730 09/03/18 0900 09/03/18 0903  BP:    (!) 159/95  Pulse: 84 90 (!) 113 80  Resp: 11 13 (!) 24 (!) 27  Temp:    97.8 F (36.6 C)  TempSrc:    Oral  SpO2: 97% 99% 94% (!) 89%  Weight:      Height:        Intake/Output Summary (Last 24 hours) at 09/03/2018 1055 Last data filed at 09/03/2018 0730 Gross per 24 hour  Intake 1230 ml  Output 690 ml  Net 540 ml   Filed Weights   09/01/18 1754 09/02/18 0534  Weight: 65.8  kg 66 kg    Examination:  General exam: In mild respiratory distress, elderly debilitated female HEENT:PERRL,Oral mucosa moist, Ear/Nose normal on gross exam Respiratory system: Decreased  air entry on the left side, bilateral expiratory wheezes Cardiovascular system: S1 & S2 heard, RRR. No JVD, murmurs, rubs, gallops or clicks. No pedal edema. Gastrointestinal system: Abdomen is nondistended, soft and nontender. No organomegaly or masses felt. Normal bowel sounds heard. Central nervous system: Alert and oriented. No focal neurological deficits. Extremities: No edema, no clubbing ,no cyanosis, distal peripheral pulses palpable. Skin: No rashes, lesions or ulcers,no icterus ,no pallor MSK: Normal muscle bulk,tone ,power Psychiatry: Judgement and insight appear normal. Mood & affect appropriate.     Data Reviewed: I have personally reviewed following labs and imaging studies  CBC: Recent Labs  Lab 09/01/18 1816 09/02/18 0315  WBC 8.0 10.0  NEUTROABS 6.9  --   HGB 13.8 12.5  HCT 39.9 35.4*  MCV 91.7 89.6  PLT 282 101   Basic Metabolic Panel: Recent Labs  Lab 09/01/18 1816 09/02/18 0315 09/03/18 0345  NA 130* 131* 132*  K 3.5 3.5 4.0  CL 94* 95* 99  CO2 24 25 23   GLUCOSE 158* 136* 128*  BUN 14 16 20   CREATININE 0.75 0.76 0.67  CALCIUM 9.4 8.9 8.3*  MG  --  2.1  --   PHOS  --  3.8  --    GFR: Estimated Creatinine Clearance: 51.7 mL/min (by C-G formula based on SCr of 0.67 mg/dL). Liver Function Tests: Recent Labs  Lab 09/02/18 0315 09/02/18 1013  AST 17  --   ALT 18  --   ALKPHOS 43  --   BILITOT 1.2  --   PROT 6.8 7.2  ALBUMIN 2.9* 3.0*   No results for input(s): LIPASE, AMYLASE in the last 168 hours. No results for input(s): AMMONIA in the last 168 hours. Coagulation Profile: Recent Labs  Lab 09/02/18 0031  INR 0.98   Cardiac Enzymes: Recent Labs  Lab 09/01/18 2019 09/02/18 0315 09/02/18 1013  TROPONINI <0.03 <0.03 <0.03   BNP (last 3 results) No results for input(s): PROBNP in the last 8760 hours. HbA1C: No results for input(s): HGBA1C in the last 72 hours. CBG: No results for input(s): GLUCAP in the last 168 hours. Lipid  Profile: No results for input(s): CHOL, HDL, LDLCALC, TRIG, CHOLHDL, LDLDIRECT in the last 72 hours. Thyroid Function Tests: Recent Labs    09/02/18 0315 09/02/18 1013  TSH 0.225*  --   FREET4  --  1.21  T3FREE  --  2.3   Anemia Panel: No results for input(s): VITAMINB12, FOLATE, FERRITIN, TIBC, IRON, RETICCTPCT in the last 72 hours. Sepsis Labs: Recent Labs  Lab 09/02/18 0031 09/02/18 0315  PROCALCITON <0.10  --   LATICACIDVEN 2.9* 1.1    Recent Results (from the past 240 hour(s))  Blood culture (routine x 2)     Status: None (Preliminary result)   Collection Time: 09/01/18  8:35 PM  Result Value Ref Range Status   Specimen Description BLOOD RIGHT ANTECUBITAL  Final   Special Requests   Final    BOTTLES DRAWN AEROBIC AND ANAEROBIC Blood Culture adequate volume   Culture   Final    NO GROWTH < 24 HOURS Performed at [facility], [place] [address] [place], [place] [address]    Report Status PENDING  Incomplete  Blood culture (routine x 2)     Status: None (Preliminary result)   Collection Time: 09/01/18  9:27 PM  Result Value Ref Range Status   Specimen Description BLOOD RIGHT HAND  Final   Special Requests   Final    BOTTLES DRAWN AEROBIC AND ANAEROBIC Blood Culture adequate volume   Culture NO GROWTH < 24 HOURS  Final   Report Status PENDING  Incomplete  Respiratory Panel by PCR     Status: None   Collection Time: 09/02/18  1:19 AM  Result Value Ref Range Status   Adenovirus NOT DETECTED NOT DETECTED Final   Coronavirus 229E NOT DETECTED NOT DETECTED Final   Coronavirus HKU1 NOT DETECTED NOT DETECTED Final   Coronavirus NL63 NOT DETECTED NOT DETECTED Final   Coronavirus OC43 NOT DETECTED NOT DETECTED Final   Metapneumovirus NOT DETECTED NOT DETECTED Final   Rhinovirus / Enterovirus NOT DETECTED NOT DETECTED Final   Influenza A NOT DETECTED NOT DETECTED Final   Influenza B NOT DETECTED NOT DETECTED Final   Parainfluenza Virus 1 NOT DETECTED NOT DETECTED Final    Parainfluenza Virus 2 NOT DETECTED NOT DETECTED Final   Parainfluenza Virus 3 NOT DETECTED NOT DETECTED Final   Parainfluenza Virus 4 NOT DETECTED NOT DETECTED Final   Respiratory Syncytial Virus NOT DETECTED NOT DETECTED Final   Bordetella pertussis NOT DETECTED NOT DETECTED Final   Chlamydophila pneumoniae NOT DETECTED NOT DETECTED Final   Mycoplasma pneumoniae NOT DETECTED NOT DETECTED Final  MRSA PCR Screening     Status: None   Collection Time: 09/02/18  1:19 AM  Result Value Ref Range Status   MRSA by PCR NEGATIVE NEGATIVE Final    Comment:        The GeneXpert MRSA Assay (FDA approved for NASAL specimens only), is one component of a comprehensive MRSA colonization surveillance program. It is not intended to diagnose MRSA infection nor to guide or monitor treatment for MRSA infections. Performed at [facility], [place] [address], [place], [place] [address]   Gram stain     Status: None   Collection Time: 09/02/18 12:37 PM  Result Value Ref Range Status   Specimen Description FLUID PLEURAL  Final   Special Requests NONE  Final   Gram Stain   Final    ABUNDANT WBC PRESENT, PREDOMINANTLY MONONUCLEAR NO ORGANISMS SEEN Performed at [facility], [place] [address] [place], [place] [address]    Report Status 09/02/2018 FINAL  Final  Culture, body fluid-bottle     Status: None (Preliminary result)   Collection Time: 09/02/18 12:37 PM  Result Value Ref Range Status   Specimen Description FLUID PLEURAL  Final   Special Requests NONE  Final   Culture NO GROWTH <12 HOURS  Final   Report Status PENDING  Incomplete         Radiology Studies: Ct Angio Chest Pe W/cm &/or Wo Cm  Result Date: 09/01/2018 CLINICAL DATA:  Shortness of breath. EXAM: CT ANGIOGRAPHY CHEST WITH CONTRAST TECHNIQUE: Multidetector CT imaging of the chest was performed using the standard protocol during bolus administration of intravenous contrast. Multiplanar CT image reconstructions and MIPs  were obtained to evaluate the vascular anatomy. CONTRAST:  52 mL ISOVUE-370 IOPAMIDOL (ISOVUE-370) INJECTION 76% COMPARISON:  Chest radiograph 09/01/2018 FINDINGS: Cardiovascular: There are no filling defects within the pulmonary arteries to suggest pulmonary embolus. Diffuse atherosclerosis of the thoracic aorta without aneurysm or dissection. Conventional branching pattern from the aortic arch. There are coronary artery calcifications. Heart is normal in size. Small pericardial effusion measures up to 10 mm in depth. Mediastinum/Nodes: Prominent left hilar  lymph nodes largest measuring up to 13 mm, image 65 series 5. Prominent subcarinal node measures 12 mm short axis. Small high mediastinal nodes are not enlarged by size criteria. No enlarged right hilar lymph nodes. No axillary adenopathy. Esophagus is decompressed. No visualized thyroid nodule. Lungs/Pleura: Moderate size left pleural effusion is partially loculated. Associated compressive atelectasis in the left lower and to a lesser extent upper lobes. Suspect spiculated 19 x 16 mm anterior left upper lobe nodule image 68 series 6, abutting the pleural surface. Fissural nodularity extends throughout the left interlobar fissure with possible tracking pleural fluid. Dominant fissural nodule measures 9 mm image 35 series 6. Moderate emphysema. There is narrowing of the left upper and lower lobe bronchi with mucous plugging in the lingula. Additional areas of mucous plugging in the right lower lobe. Right middle lobe pleural based nodule measures 4 mm, image 85 series 6. 5 mm right apical nodule, image 19. Additional tiny subpleural nodules in the right lung. Upper Abdomen: Motion artifact through the abdomen. Left adrenal thickening without dominant nodule. Atherosclerotic plaque at the diaphragmatic hiatus causes luminal narrowing of the aorta of greater than 50%. Musculoskeletal: No blastic or destructive lytic lesions. Review of the MIP images confirms the  above findings. IMPRESSION: 1. Suspect spiculated 19 x 16 mm anterior left upper lobe nodule, suspicious for primary bronchogenic malignancy. Fissural nodularity extends throughout the left interlobar fissure. Enlarged left hilar lymph node and prominent subcarinal node. Consider multi disciplinary pulmonary referral for further workup. 2. Moderate size partially loculated left pleural effusion with compressive atelectasis in the left lower and to a lesser extent upper lobes. 3. Small nodules in the right lung, including a 5 mm right apical nodule that is slightly irregular, nonspecific. 4. No pulmonary embolus. 5. Moderate emphysema. Areas of mucous plugging involves the left upper lobe and right lower lobes. 6. Advanced aortic atherosclerosis. Calcified plaque at the diaphragmatic hiatus causes luminal narrowing of greater than 50%. Aortic Atherosclerosis (ICD10-I70.0) and Emphysema (ICD10-J43.9). Electronically Signed   By: Keith Rake M.D.   On: 09/01/2018 22:40   Dg Chest Port 1 View  Result Date: 09/01/2018 CLINICAL DATA:  Short of breath. EXAM: PORTABLE CHEST 1 VIEW COMPARISON:  None. FINDINGS: Cardiac silhouette is partly obscured by left lung base opacity. Is grossly normal in size. No mediastinal or hilar masses. Opacity at the left lung base is consistent with a moderate pleural effusion with associated atelectasis or pneumonia. Lungs show prominent bronchovascular markings and mild lower lung zone interstitial thickening, presumed chronic. Mild scarring noted at the apices. No convincing right pleural effusion.  No pneumothorax. Skeletal structures are grossly intact. IMPRESSION: 1. Moderate left pleural effusion associated with left lung base atelectasis or pneumonia. 2. No other evidence of acute cardiopulmonary disease. Electronically Signed   By: Lajean Manes M.D.   On: 09/01/2018 18:49   Ct Image Guided Drainage By Percutaneous Catheter  Result Date: 09/02/2018 CLINICAL DATA:  Loculated  left pleural effusion and left upper lobe pulmonary nodule. There has been inability to obtain pleural fluid with attempted bedside thoracentesis. Request has been made to place a pleural drainage catheter. EXAM: CT GUIDED LEFT PLEURAL DRAINAGE CATHETER PLACEMENT ANESTHESIA/SEDATION: 4.0 mg IV Versed 100 mcg IV Fentanyl Total Moderate Sedation Time:  24 minutes The patient's level of consciousness and physiologic status were continuously monitored during the procedure by Radiology nursing. PROCEDURE: The procedure, risks, benefits, and alternatives were explained to the patient. Questions regarding the procedure were encouraged and answered. The patient  understands and consents to the procedure. A time out was performed prior to initiating the procedure. CT was performed in a supine position with the left side rolled up. The left lateral chest wall was prepped with chlorhexidine in a sterile fashion, and a sterile drape was applied covering the operative field. A sterile gown and sterile gloves were used for the procedure. Local anesthesia was provided with 1% Lidocaine. Under CT guidance, an 18 gauge trocar needle was advanced into the left pleural space. Fluid was aspirated. A guidewire was advanced in the needle removed. The percutaneous tract was dilated over the guidewire and a 12 French pigtail drainage catheter placed over the wire. Catheter position was confirmed by CT. Additional fluid was aspirated and sent for requested laboratory testing. The catheter was then connected to a [employer]ara Pleur-evac and wall suction at -20 cm water. COMPLICATIONS: None FINDINGS: Initial aspiration yielded grossly bloody left pleural fluid. After placement of the pigtail pleural drain, there is good return of bloody fluid. A sample of 120 mL of fluid was sent for requested laboratory testing. IMPRESSION: CT-guided placement of left pleural drainage catheter for drainage of a loculated left pleural effusion. There was return of  grossly bloody pleural fluid. A 12 French catheter was placed and attached to a Pleur-evac device and wall suction at -20 cm of water. A sample of fluid was sent for requested laboratory testing. Electronically Signed   By: Aletta Edouard M.D.   On: 09/02/2018 12:50        Scheduled Meds: . ALPRAZolam  0.75 mg Oral QHS  . atorvastatin  40 mg Oral QPM  . azithromycin  500 mg Oral Daily  . famotidine  10 mg Oral Daily  . feeding supplement (ENSURE ENLIVE)  237 mL Oral BID BM  . fluticasone  1 spray Each Nare Daily  . gabapentin  100 mg Oral TID  . guaiFENesin  600 mg Oral BID  . ipratropium-albuterol  3 mL Nebulization Q4H  . loratadine  10 mg Oral Daily  . methylPREDNISolone (SOLU-MEDROL) injection  40 mg Intravenous Q12H  . [START ON 09/04/2018] multivitamin with minerals  1 tablet Oral Daily   Continuous Infusions: . ceFEPime (MAXIPIME) IV 1 g (09/03/18 0513)  . vancomycin 1,000 mg (09/02/18 2324)     LOS: 2 days    Time spent: 35 mins.More than 50% of that time was spent in counseling and/or coordination of care.      Shelly Coss, MD [facility] Pager [phone]  If 7PM-7AM, please contact night-coverage [email or web address] Password [email or web address] 09/03/2018, 10:55 AM

## 2018-09-03 NOTE — Progress Notes (Signed)
Initial Nutrition Assessment  DOCUMENTATION CODES:   Not applicable  INTERVENTION:   Ensure Enlive po BID, each supplement provides 350 kcal and 20 grams of protein  MVI daily   Liberalize diet   NUTRITION DIAGNOSIS:   Inadequate oral intake related to acute illness as evidenced by per patient/family report.  GOAL:   Patient will meet greater than or equal to 90% of their needs  MONITOR:   PO intake, Supplement acceptance, Labs, Weight trends, Skin, I & O's  REASON FOR ASSESSMENT:   Consult Assessment of nutrition requirement/status  ASSESSMENT:   78 year old female with past medical COPD, RA on methotrexate and Plaquenil, cutaneous lupus, coronary artery disease, peripheral antibiotic, hyperlipidemia, hypertension, tobacco abuse who presented with complaints of shortness of breath. Pt found to have pleural effusion s/p IR drain placement 1/5   Met with pt in room today. Pt reports fair appetite and oral intake at baseline; reports her oral has been decreased over the past few weeks r/t SOB. Pt reports her appetite is good today but she was not able to eat breakfast because she continues to be SOB. Pt is willing to drink chocolate Ensure to help her meet her needs as she feels she can drink better than she can eat. Per chart, pt is fairly weight stable pta. Pt's UBW over the past year has ranged from 145-150lbs. RD will add supplements and MVI to help pt meet her needs until her appetite improves. RD will also liberalize pt's diet as pt not eating enough to exceed nutrient limits and a heart healthy diet is restrictive of protein.   Medications reviewed and include: azithromycin, pepcid, solu-medrol, cefepime   Labs reviewed: Na 131(L), Cl 95(L), K 3.5 wnl, P 3.8 wnl, Mg 2.1 wnl- 1/5  NUTRITION - FOCUSED PHYSICAL EXAM:    Most Recent Value  Orbital Region  No depletion  Upper Arm Region  No depletion  Thoracic and Lumbar Region  No depletion  Buccal Region  No depletion   Temple Region  Mild depletion  Clavicle Bone Region  Mild depletion  Clavicle and Acromion Bone Region  Mild depletion  Scapular Bone Region  No depletion  Dorsal Hand  No depletion  Patellar Region  No depletion  Anterior Thigh Region  No depletion  Posterior Calf Region  No depletion  Edema (RD Assessment)  Mild  Hair  Reviewed  Eyes  Reviewed  Mouth  Reviewed  Skin  Reviewed  Nails  Reviewed     Diet Order:   Diet Order            Diet regular Room service appropriate? Yes; Fluid consistency: Thin  Diet effective now             EDUCATION NEEDS:   Education needs have been addressed  Skin:  Skin Assessment: Reviewed RN Assessment  Last BM:  1/4  Height:   Ht Readings from Last 1 Encounters:  09/01/18 '5\' 2"'  (1.575 m)    Weight:   Wt Readings from Last 1 Encounters:  09/02/18 66 kg    Ideal Body Weight:  50 kg  BMI:  Body mass index is 26.61 kg/m.  Estimated Nutritional Needs:   Kcal:  1400-1600kcal/day   Protein:  73-86g/day   Fluid:  1.3L/day   Koleen Distance MS, RD, LDN Pager #- [phone] Office#- [phone] After Hours Pager: [phone]

## 2018-09-03 NOTE — Progress Notes (Signed)
PT Cancellation Note  Patient Details Name: Donna Mcneil MRN: [id number] DOB: 1941-02-19   Cancelled Treatment:    Reason Eval/Treat Not Completed: Other (comment).  Reports poor sleep overnight and has not rested yet today.  Follow up tomorrow to see if pt can do more.   Ramond Dial 09/03/2018, 4:35 PM   Mee Hives, PT MS Acute Rehab Dept. Number: [place] [facility] [place]

## 2018-09-04 DIAGNOSIS — J910 Malignant pleural effusion: Secondary | ICD-10-CM

## 2018-09-04 MED ORDER — PHENOL 1.4 % MT LIQD
1.0000 | OROMUCOSAL | Status: DC | PRN
  Filled 2018-09-04: qty 177

## 2018-09-04 MED ORDER — SODIUM CHLORIDE 0.9 % IV SOLN
1.0000 g | Freq: Two times a day (BID) | INTRAVENOUS | Status: DC
  Administered 2018-09-04: 1 g via INTRAVENOUS
  Filled 2018-09-04 (×2): qty 1

## 2018-09-04 NOTE — Progress Notes (Signed)
NAME:  Donna Mcneil, MRN:  [id number], DOB:  09/22/1940, LOS: 3 ADMISSION DATE:  09/01/2018, CONSULTATION DATE:  09/02/2017 REFERRING MD:  Roel Cluck, CHIEF COMPLAINT:  Dyspnea   Brief History   78 y/o female with COPD and RA on methotrexate admitted on 1/4 with dyspnea, cough mucus production.    History of present illness   This is a 78 year old female with a past medical history significant for COPD, rheumatoid arthritis on methotrexate and lupus who presented to [facility] on September 01, 2018 in the setting of cough, shortness of breath, and failure of symptoms to resolve after being treated with azithromycin and prednisone.  She has smoked cigarettes for many years and continues to smoke.  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  She has been treated with methotrexate, Enbrel in the past (not taking currently), and Plaquenil.  She has reported increasing shortness of breath ever since November 2019 and has been seen by her physician for similar symptoms.  She was most recently treated with azithromycin and prednisone but had no improvement in symptoms so she came to the emergency department.  In the emergency department she was noted to have a pleural effusion on the left which was loculated.  She tells me that she has had increasing shortness of breath ever since November.  She has coughed up clear mucus for years but in the last several weeks it has been yellow to green in color.  She denies fevers or chills.  She has lost quite a bit of weight over the last several months.  She thinks may be 30 pounds or so.  Sh and she says this is been steadily worsening ever since Enbrel was held over a year ago in the setting of her lupus.  09/04/2017 no acute distress, no air leak or chest tube drainage.    Procedures:  09/02/2018 attempted thoracentesis 09/02/2018 left chest tube placed for CT-guided and interventional  radiology  Significant Diagnostic Tests:  September 01, 2018 CT chest images independently reviewed: There is centrilobular emphysema bilaterally, mild to moderate in severity, the right lung is essentially clear but in the left there is a moderate sized partially loculated pleural effusion compressing the left lower lobe and partially the left upper lobe, there is a nodule adjacent to the pleural fluid in the left upper lobe of undetermined significance, there is also mediastinal and hilar adenopathy most significant on the left  Micro Data:  1/5 Pleural fluid bacterial culture> 1/5 pleural fluid fungus culture>  1/5 pleural fluid afb culture> 09/03/2017 sputum>>  Antimicrobials:     Interim history/subjective:  Reports feeling much better today on 09/04/2018  Objective   Blood pressure (!) 143/93, pulse (!) 104, temperature 97.9 F (36.6 C), temperature source Oral, resp. rate (!) 26, height 5\' 2"  (1.575 m), weight 66 kg, SpO2 94 %.        Intake/Output Summary (Last 24 hours) at 09/04/2018 0938 Last data filed at 09/04/2018 0815 Gross per 24 hour  Intake 1758.1 ml  Output 50 ml  Net 1708.1 ml   Filed Weights   09/01/18 1754 09/02/18 0534  Weight: 65.8 kg 66 kg    Examination:  General: Elderly frail female no acute distress.  On side of the bed. HEENT: No JVD lymphadenopathy is appreciated Neuro: Awake alert engaged appears neurologically intact CV: Sounds are distant PULM: Creased air movement throughout.  Left chest tube in place without air leak or  drainage OV:ANVB, non-tender, bsx4 active  Extremities: warm/dry, 1+ edema  Skin: no rashes or lesions    Resolved Hospital Problem list     Assessment & Plan:  Loculated pleural effusion with nodularity in the left lung and mediastinal adenopathy: The differential diagnosis is broad and I think the likelihood of bronchogenic malignancy, rheumatoid arthritis associated nodules and effusion, or infection are all equal  considering her clinical context 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 09/04/2017 less than 50 cc drainage over 24 hours Plan: Recommend discontinuation of left chest tube catheter, since it was placed by interventional radiology will defer to their timing. Monitor cytology   COPD with acute exacerbation:  Plan: Currently on cefepime Currently on Solu-Medrol 40 mg every 12 hours can consider decreasing dose within the next 24 hours Continue bronchodilators Continue anxiolytics  Cigarette smoker: Plan Counseled to quit  Best practice:   Per primary team  Labs   CBC: Recent Labs  Lab 09/01/18 1816 09/02/18 0315  WBC 8.0 10.0  NEUTROABS 6.9  --   HGB 13.8 12.5  HCT 39.9 35.4*  MCV 91.7 89.6  PLT 282 166    Basic Metabolic Panel: Recent Labs  Lab 09/01/18 1816 09/02/18 0315 09/03/18 0345  NA 130* 131* 132*  K 3.5 3.5 4.0  CL 94* 95* 99  CO2 24 25 23   GLUCOSE 158* 136* 128*  BUN 14 16 20   CREATININE 0.75 0.76 0.67  CALCIUM 9.4 8.9 8.3*  MG  --  2.1  --   PHOS  --  3.8  --    GFR: Estimated Creatinine Clearance: 51.7 mL/min (by C-G formula based on SCr of 0.67 mg/dL). Recent Labs  Lab 09/01/18 1816 09/02/18 0031 09/02/18 0315  PROCALCITON  --  <0.10  --   WBC 8.0  --  10.0  LATICACIDVEN  --  2.9* 1.1    Liver Function Tests: Recent Labs  Lab 09/02/18 0315 09/02/18 1013  AST 17  --   ALT 18  --   ALKPHOS 43  --   BILITOT 1.2  --   PROT 6.8 7.2  ALBUMIN 2.9* 3.0*   No results for input(s): LIPASE, AMYLASE in the last 168 hours. No results for input(s): AMMONIA in the last 168 hours.  ABG    Component Value Date/Time   HCO3 26.0 09/01/2018 2046   TCO2 27 09/01/2018 2046   O2SAT 89.0 09/01/2018 2046     Coagulation Profile: Recent Labs  Lab 09/02/18 0031  INR 0.98    Cardiac Enzymes: Recent Labs  Lab 09/01/18 2019  09/02/18 0315 09/02/18 1013  TROPONINI <0.03 <0.03 <0.03    HbA1C: No results found for: HGBA1C  CBG: No results for input(s): GLUCAP in the last 168 hours.   Richardson Landry Lowen Barringer ACNP [facility] P[facility] Pager [phone] till 1 pm If no answer page [phone] 09/04/2018, 9:38 AM

## 2018-09-04 NOTE — Progress Notes (Signed)
PT Cancellation Note  Patient Details Name: Donna Mcneil MRN: [id number] DOB: 17-Nov-1940   Cancelled Treatment:    Reason Eval/Treat Not Completed: Other (comment).  Pt declines to do anything until her MD comes to remove her chest tube.  Talked with her about portability of equipment but she still declines.  Follow up at another time.   Ramond Dial 09/04/2018, 12:03 PM  Mee Hives, PT MS Acute Rehab Dept. Number: [place]

## 2018-09-04 NOTE — Progress Notes (Signed)
PROGRESS NOTE    RETIA CORDLE  KGM:[id number] DOB: July 27, 1941 DOA: 09/01/2018 PCP: [name], PA   Brief Narrative: Patient is a 78 year old female with past medical COPD, RA on methotrexate and Plaquenil, cutaneous lupus, coronary artery disease, peripheral antibiotic, hyperlipidemia, hypertension, tobacco abuse who presented with complaints of shortness of breath from home.  She reported cold-like symptoms for last 1 week.  Also was having productive cough with yellow sputum.  She felt outpatient antibiotic therapy with azithromycin.  No report of fever or chills at home.  Follows with rheumatology outpatient.Continues to smoke despite diagnosis of COPD.  Reported to have lost around 30 pounds over the last several months.  On presentation she was found to have Left-sided loculated pleural effusion with nodularity in the left lung and metastatic adenopathy.  Pulmonary consulted.  Attempted for bedside left-sided thoracentesis which failed to drain.  IR consulted.  Underwent CT-guided drain placement on 09/02/18.  Assessment & Plan:   Active Problems:   Seropositive rheumatoid arthritis (HCC)   Hyperlipidemia   Hypertension   Tobacco use   Rheumatoid arthritis involving multiple sites with positive rheumatoid factor (HCC)   COPD exacerbation (HCC)   Acute respiratory failure (HCC)   Hyponatremia   Pleural effusion   Pulmonary nodule  Acute Respiratory failure with hypoxia: Multifactorial secondary to left-sided pleural effusion, COPD.  Continue to monitor respiratory status.  Saturating fine on supplemental oxygen.  Currently her respiratory status stable.  Loculated left pleural effusion: Found to have moderate pleural effusion on the left side with nodularity and mediastinal adenopathy.  Rule out bronchogenic carcinoma.  She has history of rheumatoid arthritis which can also present with similar picture. Underwent CT-guided drain placement by IR on 09/02/18.  We will  follow-up pleural fluid analysis including cytology, culture.Fluid analysis shows that is exudative in nature.  Gram stain did not show any organism Continue broad spectrum  antibiotics for now.  Currently on cefepime. IR managing the chest tube.  Less than 50 cc of drainage over 24 hours.  Plan for removal. Echocardiogram showed ejection fraction of 65 to 70%.  Grade 1 diastolic dysfunction.  COPD with acute exacerbation: Wheezes have improved today.  Started on IV Solu-Medrol.  Continue Mucinex.Continue bronchodilators.  She is not on oxygen at home.  Continue supplemental oxygen as needed. We will taper the steroids soon.  Seropositive rheumatoid arthritis: On immunosuppressants at home.  On methotrexate and Plaquenil.  Currently on hold.  Hyperlipidemia: Continue home medication.  Hypertension: Currently blood pressure stable.  Continue home medications.  Tobacco abuse: Continues to smoke.  Counseled for cessation.  Debility/deconditioning:PT following.  Recommending home health  Nutrition Problem: Inadequate oral intake Etiology: acute illness      DVT prophylaxis:SCD Code Status: DNR Family Communication: None present at the bedside Disposition Plan: Home after removal of the left chest tube, cytology report, stabilization of the respiratory status.   Consultants: Pulmonary, IR  Procedures: CT-guided left pleural drain  Antimicrobials: Cefepime Day 3  Subjective: Patient seen and examined at bedside this morning.  She looks more comfortable today.  She ate all her breakfast.  Feels good.  Respiratory status has improved since yesterday after she was  started on steroids.   Objective: Vitals:   09/04/18 0403 09/04/18 0431 09/04/18 0740 09/04/18 0815  BP:  (!) 167/78  (!) 143/93  Pulse:  66 (!) 104   Resp:  17 (!) 26   Temp:  98 F (36.7 C)  97.9 F (36.6 C)  TempSrc:  Oral  Oral  SpO2: 100% 97% 91% 94%  Weight:      Height:        Intake/Output Summary  (Last 24 hours) at 09/04/2018 1056 Last data filed at 09/04/2018 0815 Gross per 24 hour  Intake 1758.1 ml  Output 50 ml  Net 1708.1 ml   Filed Weights   09/01/18 1754 09/02/18 0534  Weight: 65.8 kg 66 kg    Examination:  General exam: Not in distress, elderly debilitated female HEENT:PERRL,Oral mucosa moist, Ear/Nose normal on gross exam Respiratory system: Decreased air entry billaterally, mild scattered wheezes.  Left-sided chest tube. Cardiovascular system: S1 & S2 heard, RRR. No JVD, murmurs, rubs, gallops or clicks. No pedal edema. Gastrointestinal system: Abdomen is nondistended, soft and nontender. No organomegaly or masses felt. Normal bowel sounds heard. Central nervous system: Alert and oriented. No focal neurological deficits. Extremities: No edema, no clubbing ,no cyanosis, distal peripheral pulses palpable. Skin: No rashes, lesions or ulcers,no icterus ,no pallor MSK: Normal muscle bulk,tone ,power Psychiatry: Judgement and insight appear normal. Mood & affect appropriate.     Data Reviewed: I have personally reviewed following labs and imaging studies  CBC: Recent Labs  Lab 09/01/18 1816 09/02/18 0315  WBC 8.0 10.0  NEUTROABS 6.9  --   HGB 13.8 12.5  HCT 39.9 35.4*  MCV 91.7 89.6  PLT 282 465   Basic Metabolic Panel: Recent Labs  Lab 09/01/18 1816 09/02/18 0315 09/03/18 0345  NA 130* 131* 132*  K 3.5 3.5 4.0  CL 94* 95* 99  CO2 24 25 23   GLUCOSE 158* 136* 128*  BUN 14 16 20   CREATININE 0.75 0.76 0.67  CALCIUM 9.4 8.9 8.3*  MG  --  2.1  --   PHOS  --  3.8  --    GFR: Estimated Creatinine Clearance: 51.7 mL/min (by C-G formula based on SCr of 0.67 mg/dL). Liver Function Tests: Recent Labs  Lab 09/02/18 0315 09/02/18 1013  AST 17  --   ALT 18  --   ALKPHOS 43  --   BILITOT 1.2  --   PROT 6.8 7.2  ALBUMIN 2.9* 3.0*   No results for input(s): LIPASE, AMYLASE in the last 168 hours. No results for input(s): AMMONIA in the last 168  hours. Coagulation Profile: Recent Labs  Lab 09/02/18 0031  INR 0.98   Cardiac Enzymes: Recent Labs  Lab 09/01/18 2019 09/02/18 0315 09/02/18 1013  TROPONINI <0.03 <0.03 <0.03   BNP (last 3 results) No results for input(s): PROBNP in the last 8760 hours. HbA1C: No results for input(s): HGBA1C in the last 72 hours. CBG: No results for input(s): GLUCAP in the last 168 hours. Lipid Profile: No results for input(s): CHOL, HDL, LDLCALC, TRIG, CHOLHDL, LDLDIRECT in the last 72 hours. Thyroid Function Tests: Recent Labs    09/02/18 0315 09/02/18 1013  TSH 0.225*  --   FREET4  --  1.21  T3FREE  --  2.3   Anemia Panel: No results for input(s): VITAMINB12, FOLATE, FERRITIN, TIBC, IRON, RETICCTPCT in the last 72 hours. Sepsis Labs: Recent Labs  Lab 09/02/18 0031 09/02/18 0315  PROCALCITON <0.10  --   LATICACIDVEN 2.9* 1.1    Recent Results (from the past 240 hour(s))  Blood culture (routine x 2)     Status: None (Preliminary result)   Collection Time: 09/01/18  8:35 PM  Result Value Ref Range Status   Specimen Description BLOOD RIGHT ANTECUBITAL  Final   Special Requests  Final    BOTTLES DRAWN AEROBIC AND ANAEROBIC Blood Culture adequate volume   Culture   Final    NO GROWTH 3 DAYS Performed at [facility], [place] [address], [place], [place] [address]    Report Status PENDING  Incomplete  Blood culture (routine x 2)     Status: None (Preliminary result)   Collection Time: 09/01/18  9:27 PM  Result Value Ref Range Status   Specimen Description BLOOD RIGHT HAND  Final   Special Requests   Final    BOTTLES DRAWN AEROBIC AND ANAEROBIC Blood Culture adequate volume   Culture   Final    NO GROWTH 3 DAYS Performed at [facility], [place] [address], [place], [place] [address]    Report Status PENDING  Incomplete  Respiratory Panel by PCR     Status: None   Collection Time: 09/02/18  1:19 AM  Result Value Ref Range Status   Adenovirus NOT DETECTED NOT  DETECTED Final   Coronavirus 229E NOT DETECTED NOT DETECTED Final   Coronavirus HKU1 NOT DETECTED NOT DETECTED Final   Coronavirus NL63 NOT DETECTED NOT DETECTED Final   Coronavirus OC43 NOT DETECTED NOT DETECTED Final   Metapneumovirus NOT DETECTED NOT DETECTED Final   Rhinovirus / Enterovirus NOT DETECTED NOT DETECTED Final   Influenza A NOT DETECTED NOT DETECTED Final   Influenza B NOT DETECTED NOT DETECTED Final   Parainfluenza Virus 1 NOT DETECTED NOT DETECTED Final   Parainfluenza Virus 2 NOT DETECTED NOT DETECTED Final   Parainfluenza Virus 3 NOT DETECTED NOT DETECTED Final   Parainfluenza Virus 4 NOT DETECTED NOT DETECTED Final   Respiratory Syncytial Virus NOT DETECTED NOT DETECTED Final   Bordetella pertussis NOT DETECTED NOT DETECTED Final   Chlamydophila pneumoniae NOT DETECTED NOT DETECTED Final   Mycoplasma pneumoniae NOT DETECTED NOT DETECTED Final  MRSA PCR Screening     Status: None   Collection Time: 09/02/18  1:19 AM  Result Value Ref Range Status   MRSA by PCR NEGATIVE NEGATIVE Final    Comment:        The GeneXpert MRSA Assay (FDA approved for NASAL specimens only), is one component of a comprehensive MRSA colonization surveillance program. It is not intended to diagnose MRSA infection nor to guide or monitor treatment for MRSA infections. Performed at [facility], [place] [address], [place], [place] [address]   Gram stain     Status: None   Collection Time: 09/02/18 12:37 PM  Result Value Ref Range Status   Specimen Description FLUID PLEURAL  Final   Special Requests NONE  Final   Gram Stain   Final    ABUNDANT WBC PRESENT, PREDOMINANTLY MONONUCLEAR NO ORGANISMS SEEN Performed at [facility], [place] [address], [place], [place] [address]    Report Status 09/02/2018 FINAL  Final  Culture, body fluid-bottle     Status: None (Preliminary result)   Collection Time: 09/02/18 12:37 PM  Result Value Ref Range Status   Specimen Description FLUID  PLEURAL  Final   Special Requests NONE  Final   Culture   Final    NO GROWTH 2 DAYS Performed at [place] [address] [place], [place] [address]    Report Status PENDING  Incomplete  Culture, sputum-assessment     Status: None   Collection Time: 09/03/18  1:01 PM  Result Value Ref Range Status   Specimen Description SPUTUM  Final   Special Requests  Immunocompromised  Final   Sputum evaluation   Final    Sputum specimen not acceptable for testing.  Please recollect.   RESULT CALLED TO, READ BACK BY AND VERIFIED WITH: T PAPAKY RN 09/03/18 1957 JDW Performed at [facility], [place] [address] [place], [place] [address]    Report Status 09/03/2018 FINAL  Final         Radiology Studies: Dg Chest Port 1 View  Result Date: 09/03/2018 CLINICAL DATA:  Pleural effusion EXAM: PORTABLE CHEST 1 VIEW COMPARISON:  CT chest 09/01/2018 FINDINGS: The lungs are hyperinflated likely secondary to COPD. There is a small left pleural effusion. There is a left pleural drainage catheter present. There is chronic bilateral interstitial thickening. There is no pneumothorax. The heart mediastinum are stable. The osseous structures are unremarkable. IMPRESSION: 1. Small left pleural effusion with a left pleural drainage catheter present. No pneumothorax. Electronically Signed   By: Kathreen Devoid   On: 09/03/2018 16:04   Ct Image Guided Drainage By Percutaneous Catheter  Result Date: 09/02/2018 CLINICAL DATA:  Loculated left pleural effusion and left upper lobe pulmonary nodule. There has been inability to obtain pleural fluid with attempted bedside thoracentesis. Request has been made to place a pleural drainage catheter. EXAM: CT GUIDED LEFT PLEURAL DRAINAGE CATHETER PLACEMENT ANESTHESIA/SEDATION: 4.0 mg IV Versed 100 mcg IV Fentanyl Total Moderate Sedation Time:  24 minutes The patient's level of consciousness and physiologic status were continuously monitored during the procedure by Radiology  nursing. PROCEDURE: The procedure, risks, benefits, and alternatives were explained to the patient. Questions regarding the procedure were encouraged and answered. The patient understands and consents to the procedure. A time out was performed prior to initiating the procedure. CT was performed in a supine position with the left side rolled up. The left lateral chest wall was prepped with chlorhexidine in a sterile fashion, and a sterile drape was applied covering the operative field. A sterile gown and sterile gloves were used for the procedure. Local anesthesia was provided with 1% Lidocaine. Under CT guidance, an 18 gauge trocar needle was advanced into the left pleural space. Fluid was aspirated. A guidewire was advanced in the needle removed. The percutaneous tract was dilated over the guidewire and a 12 French pigtail drainage catheter placed over the wire. Catheter position was confirmed by CT. Additional fluid was aspirated and sent for requested laboratory testing. The catheter was then connected to a [place]ara Pleur-evac and wall suction at -20 cm water. COMPLICATIONS: None FINDINGS: Initial aspiration yielded grossly bloody left pleural fluid. After placement of the pigtail pleural drain, there is good return of bloody fluid. A sample of 120 mL of fluid was sent for requested laboratory testing. IMPRESSION: CT-guided placement of left pleural drainage catheter for drainage of a loculated left pleural effusion. There was return of grossly bloody pleural fluid. A 12 French catheter was placed and attached to a Pleur-evac device and wall suction at -20 cm of water. A sample of fluid was sent for requested laboratory testing. Electronically Signed   By: Aletta Edouard M.D.   On: 09/02/2018 12:50   Vas [place] Lower Extremity Venous (dvt)  Result Date: 09/03/2018  Lower Venous Study Indications: Edema.  Performing Technologist: Oliver Hum RVT  Examination Guidelines: A complete evaluation includes B-mode  imaging, spectral Doppler, color Doppler, and power Doppler as needed of all accessible portions of each vessel. Bilateral testing is considered an integral part of a complete examination. Limited examinations for reoccurring indications may be  performed as noted.  Right Venous Findings: +---------+---------------+---------+-----------+----------+-------+          CompressibilityPhasicitySpontaneityPropertiesSummary +---------+---------------+---------+-----------+----------+-------+ CFV      Full           Yes      Yes                          +---------+---------------+---------+-----------+----------+-------+ SFJ      Full                                                 +---------+---------------+---------+-----------+----------+-------+ FV Prox  Full                                                 +---------+---------------+---------+-----------+----------+-------+ FV Mid   Full                                                 +---------+---------------+---------+-----------+----------+-------+ FV DistalFull                                                 +---------+---------------+---------+-----------+----------+-------+ PFV      Full                                                 +---------+---------------+---------+-----------+----------+-------+ POP      Full           Yes      Yes                          +---------+---------------+---------+-----------+----------+-------+ PTV      Full                                                 +---------+---------------+---------+-----------+----------+-------+ PERO     Full                                                 +---------+---------------+---------+-----------+----------+-------+  Left Venous Findings: +---------+---------------+---------+-----------+----------+-------+          CompressibilityPhasicitySpontaneityPropertiesSummary  +---------+---------------+---------+-----------+----------+-------+ CFV      Full           Yes      Yes                          +---------+---------------+---------+-----------+----------+-------+ SFJ      Full                                                 +---------+---------------+---------+-----------+----------+-------+  FV Prox  Full                                                 +---------+---------------+---------+-----------+----------+-------+ FV Mid   Full                                                 +---------+---------------+---------+-----------+----------+-------+ FV DistalFull                                                 +---------+---------------+---------+-----------+----------+-------+ PFV      Full                                                 +---------+---------------+---------+-----------+----------+-------+ POP      Full           Yes      Yes                          +---------+---------------+---------+-----------+----------+-------+ PTV      Full                                                 +---------+---------------+---------+-----------+----------+-------+ PERO     Full                                                 +---------+---------------+---------+-----------+----------+-------+    Summary: Right: There is no evidence of deep vein thrombosis in the lower extremity. No cystic structure found in the popliteal fossa. Left: There is no evidence of deep vein thrombosis in the lower extremity. No cystic structure found in the popliteal fossa.  *See table(s) above for measurements and observations.    Preliminary         Scheduled Meds: . ALPRAZolam  0.75 mg Oral QHS  . atorvastatin  40 mg Oral QPM  . famotidine  10 mg Oral Daily  . feeding supplement (ENSURE ENLIVE)  237 mL Oral BID BM  . fluticasone  1 spray Each Nare Daily  . gabapentin  100 mg Oral TID  . guaiFENesin  600 mg Oral BID  .  ipratropium-albuterol  3 mL Nebulization Q4H  . loratadine  10 mg Oral Daily  . methylPREDNISolone (SOLU-MEDROL) injection  40 mg Intravenous Q12H  . multivitamin with minerals  1 tablet Oral Daily   Continuous Infusions: . ceFEPime (MAXIPIME) IV 1 g (09/04/18 0448)     LOS: 3 days    Time spent: 35 mins.More than 50% of that time was spent in counseling and/or coordination of care.      Shelly Coss, MD [facility] Pager [phone]  If 7PM-7AM, please contact night-coverage [email or web address] Password [email or web address]  09/04/2018, 10:56 AM

## 2018-09-04 NOTE — Care Management Important Message (Signed)
Important Message  Patient Details  Name: Donna Mcneil MRN: [id number] Date of Birth: [birth year]   Medicare Important Message Given:  Yes    Desiderio Dolata P Cottle 09/04/2018, 2:22 PM

## 2018-09-04 NOTE — Progress Notes (Signed)
Occupational Therapy Treatment Patient Details Name: Donna Mcneil MRN: [id number] DOB: 1940-12-29 Today's Date: 09/04/2018    History of present illness Pt is a 78 y.o. F with significant PMH of COPD, RA on methotrexate, lupus who presents with dyspnea and cough mucus production. Unable to drain fluid with thoracentesis attempt. CT shows moderate volume, loculated left pleural effusion. Plan to proceed with CT guided pigtail pleural drainage catheter placement to drain fluid.    OT comments  Pt making good progress with functional goals. Pt participated in toileitng at [facility] and bathing and dressing sitting EOB. Pt very talkative and pleasant. OT will continue to follow acutely  Follow Up Recommendations  Home health OT;Supervision - Intermittent;SNF    Equipment Recommendations  None recommended by OT    Recommendations for Other Services      Precautions / Restrictions Precautions Precautions: Fall Precaution Comments: watch O2, HR Restrictions Weight Bearing Restrictions: No       Mobility Bed Mobility Overal bed mobility: Modified Independent                Transfers Overall transfer level: Needs assistance   Transfers: Sit to/from Stand Sit to Stand: Supervision              Balance Overall balance assessment: Needs assistance Sitting-balance support: Feet supported Sitting balance-Leahy Scale: Good     Standing balance support: Bilateral upper extremity supported;During functional activity Standing balance-Leahy Scale: Poor                             ADL either performed or assessed with clinical judgement   ADL Overall ADL's : Needs assistance/impaired     Grooming: Wash/dry face;Wash/dry hands;Min guard;Standing   Upper Body Bathing: Set up;Supervision/ safety;Sitting   Lower Body Bathing: Minimal assistance;Sitting/lateral leans;Sit to/from stand   Upper Body Dressing : Set up;Supervision/safety;Sitting   Lower Body  Dressing: Minimal assistance;Min guard;Sitting/lateral leans;Sit to/from stand Lower Body Dressing Details (indicate cue type and reason): simulated Toilet Transfer: Supervision/safety;Ambulation;RW;BSC   Toileting- Water quality scientist and Hygiene: Min guard;Sit to/from stand       Functional mobility during ADLs: Supervision/safety;Rolling walker       Vision Baseline Vision/History: Wears glasses Wears Glasses: At all times Patient Visual Report: No change from baseline     Perception     Praxis      Cognition Arousal/Alertness: Awake/alert Behavior During Therapy: WFL for tasks assessed/performed Overall Cognitive Status: Within Functional Limits for tasks assessed                                          Exercises     Shoulder Instructions       General Comments      Pertinent Vitals/ Pain       Pain Assessment: No/denies pain  Home Living                                          Prior Functioning/Environment              Frequency  Min 2X/week        Progress Toward Goals  OT Goals(current goals can now be found in the care plan section)  Progress towards OT goals: Progressing toward goals  Plan Discharge plan remains appropriate    Co-evaluation                 AM-PAC OT "6 Clicks" Daily Activity     Outcome Measure   Help from another person eating meals?: None Help from another person taking care of personal grooming?: None Help from another person toileting, which includes using toliet, bedpan, or urinal?: A Little Help from another person bathing (including washing, rinsing, drying)?: A Little Help from another person to put on and taking off regular upper body clothing?: A Little Help from another person to put on and taking off regular lower body clothing?: A Little 6 Click Score: 20    End of Session Equipment Utilized During Treatment: Oxygen;Gait belt;Rolling walker;Other  (comment)(BSC)  OT Visit Diagnosis: Unsteadiness on feet (R26.81);Other abnormalities of gait and mobility (R26.89);Muscle weakness (generalized) (M62.81)   Activity Tolerance Patient tolerated treatment well   Patient Left in bed;with nursing/sitter in room;with family/visitor present(sitting EOB )   Nurse Communication          Time: 1448-1856 OT Time Calculation (min): 43 min  Charges: OT General Charges $OT Visit: 1 Visit OT Treatments $Self Care/Home Management : 23-37 mins $Therapeutic Activity: 8-22 mins     [name] 09/04/2018, 2:18 PM

## 2018-09-04 NOTE — Telephone Encounter (Signed)
No answer again

## 2018-09-04 NOTE — Plan of Care (Signed)
  Problem: Respiratory: shortness of breath with exertion, poor tolerated with minimal ambulation ( from bed to bedside commode), auscultated : inspiratory wheezing and crepitation both lungs, congested coughing. Goal: Ability to maintain adequate ventilation will be improved. Outcome: Progressing: breathing treatment given by RT,  encouraged Pt to do breathing exercise with incentive spirometer and effective coughing. Pt is able to do effectively, maintain spo2 > 92% with O2 NCL 3 LPM. Continue to monitor.  Kennyth Lose, BSN, RN, [facility]

## 2018-09-04 NOTE — Progress Notes (Signed)
Pharmacy Antibiotic Note  Donna Mcneil is a 78 y.o. female admitted on 09/01/2018 with pneumonia on cefepime and vancomycin.   -WBC= 10, afeb , cultures ngtd, MRSA PCR negative  Plan: No vancomycin dose changes needed Consider d/c vancomycin? Cefepime per MD Trend WBC, temp, renal function  F/U infectious work-up Drug levels as indicated   Height: 5\' 2"  (157.5 cm) Weight: 145 lb 8 oz (66 kg)(Scale A) IBW/kg (Calculated) : 50.1  Temp (24hrs), Avg:97.8 F (36.6 C), Min:97.5 F (36.4 C), Max:98 F (36.7 C)  Recent Labs  Lab 09/01/18 1816 09/02/18 0031 09/02/18 0315 09/03/18 0345  WBC 8.0  --  10.0  --   CREATININE 0.75  --  0.76 0.67  LATICACIDVEN  --  2.9* 1.1  --     Estimated Creatinine Clearance: 51.7 mL/min (by C-G formula based on SCr of 0.67 mg/dL).    Allergies  Allergen Reactions  . Codeine Other (See Comments)    GI upset  . Cyclosporine Other (See Comments)    Burning eyes  . Other     Other reaction(s): OTHER  . Pregabalin Other (See Comments)    Personality changes-becomes angry  . Zolpidem     Other reaction(s): ITCHING    Dareen Piano 09/04/2018 7:50 AM

## 2018-09-05 ENCOUNTER — Inpatient Hospital Stay (HOSPITAL_COMMUNITY): Payer: Medicare Other

## 2018-09-05 DIAGNOSIS — F17210 Nicotine dependence, cigarettes, uncomplicated: Secondary | ICD-10-CM

## 2018-09-05 DIAGNOSIS — C3412 Malignant neoplasm of upper lobe, left bronchus or lung: Secondary | ICD-10-CM

## 2018-09-05 LAB — CBC WITH DIFFERENTIAL/PLATELET
Abs Immature Granulocytes: 0.05 10*3/uL (ref 0.00–0.07)
Basophils Absolute: 0 10*3/uL (ref 0.0–0.1)
Basophils Relative: 0 %
Eosinophils Absolute: 0 10*3/uL (ref 0.0–0.5)
Eosinophils Relative: 0 %
HCT: 38.8 % (ref 36.0–46.0)
Hemoglobin: 13 g/dL (ref 12.0–15.0)
Immature Granulocytes: 1 %
Lymphocytes Relative: 4 %
Lymphs Abs: 0.5 10*3/uL — ABNORMAL LOW (ref 0.7–4.0)
MCH: 31.1 pg (ref 26.0–34.0)
MCHC: 33.5 g/dL (ref 30.0–36.0)
MCV: 92.8 fL (ref 80.0–100.0)
Monocytes Absolute: 0.6 10*3/uL (ref 0.1–1.0)
Monocytes Relative: 5 %
Neutro Abs: 10 10*3/uL — ABNORMAL HIGH (ref 1.7–7.7)
Neutrophils Relative %: 90 %
Platelets: 326 10*3/uL (ref 150–400)
RBC: 4.18 MIL/uL (ref 3.87–5.11)
RDW: 14.1 % (ref 11.5–15.5)
WBC: 11.1 10*3/uL — ABNORMAL HIGH (ref 4.0–10.5)
nRBC: 0 % (ref 0.0–0.2)

## 2018-09-05 LAB — BASIC METABOLIC PANEL
Anion gap: 9 (ref 5–15)
BUN: 26 mg/dL — ABNORMAL HIGH (ref 8–23)
CO2: 26 mmol/L (ref 22–32)
CREATININE: 0.71 mg/dL (ref 0.44–1.00)
Calcium: 8.5 mg/dL — ABNORMAL LOW (ref 8.9–10.3)
Chloride: 100 mmol/L (ref 98–111)
GFR calc Af Amer: >60 mL/min (ref >60)
GFR calc non Af Amer: >60 mL/min (ref >60)
Glucose, Bld: 132 mg/dL — ABNORMAL HIGH (ref 70–99)
Potassium: 5.1 mmol/L (ref 3.5–5.1)
Sodium: 135 mmol/L (ref 135–145)

## 2018-09-05 LAB — PH, BODY FLUID: pH, Body Fluid: 7.4

## 2018-09-05 MED ORDER — AMLODIPINE BESYLATE 5 MG PO TABS
5.0000 mg | ORAL_TABLET | Freq: Every day | ORAL | Status: DC
  Administered 2018-09-05 – 2018-09-06 (×2): 5 mg via ORAL
  Filled 2018-09-05 (×2): qty 1

## 2018-09-05 MED ORDER — HYDRALAZINE HCL 20 MG/ML IJ SOLN
5.0000 mg | INTRAMUSCULAR | Status: DC | PRN
  Administered 2018-09-05 – 2018-09-06 (×3): 5 mg via INTRAVENOUS
  Filled 2018-09-05 (×3): qty 1

## 2018-09-05 MED ORDER — AMOXICILLIN-POT CLAVULANATE 875-125 MG PO TABS
1.0000 | ORAL_TABLET | Freq: Two times a day (BID) | ORAL | Status: DC
  Administered 2018-09-05 – 2018-09-06 (×3): 1 via ORAL
  Filled 2018-09-05 (×3): qty 1

## 2018-09-05 NOTE — Progress Notes (Signed)
SATURATION QUALIFICATIONS: (This note is used to comply with regulatory documentation for home oxygen)  Patient Saturations on Room Air at Rest = 92%  Patient Saturations on Room Air while Ambulating = 87%  Patient Saturations on 3 Liters of oxygen while Ambulating = 89%  Please briefly explain why patient needs home oxygen:  Emelda Fear, RN

## 2018-09-05 NOTE — Progress Notes (Signed)
PROGRESS NOTE    Donna Mcneil  XKG:[id number] DOB: Aug 16, 1941 DOA: 09/01/2018 PCP: [name], PA   Brief Narrative: Patient is a 78 year old female with past medical COPD, RA on methotrexate and Plaquenil, cutaneous lupus, coronary artery disease, peripheral antibiotic, hyperlipidemia, hypertension, tobacco abuse who presented with complaints of shortness of breath from home.  She reported cold-like symptoms for last 1 week.  Also was having productive cough with yellow sputum.  She felt outpatient antibiotic therapy with azithromycin.  No report of fever or chills at home.  Follows with rheumatology outpatient.Continues to smoke despite diagnosis of COPD.  Reported to have lost around 30 pounds over the last several months.  On presentation she was found to have Left-sided loculated pleural effusion with nodularity in the left lung and metastatic adenopathy.  Pulmonary consulted.  Attempted for bedside left-sided thoracentesis which failed to drain.  IR consulted.  Underwent CT-guided drain placement on 09/02/18. Cytology from the pleural fluid showed atypical cells suspicious for adenocarcinoma.  Oncology consulted.  Assessment & Plan:   Active Problems:   Seropositive rheumatoid arthritis (HCC)   Hyperlipidemia   Hypertension   Tobacco use   Rheumatoid arthritis involving multiple sites with positive rheumatoid factor (HCC)   COPD exacerbation (HCC)   Acute respiratory failure (HCC)   Hyponatremia   Malignant pleural effusion   Pulmonary nodule  Acute Respiratory failure with hypoxia: Multifactorial secondary to left-sided pleural effusion, COPD,lung cancer.  Continue to monitor respiratory status.  Saturating fine on supplemental oxygen.  She needs oxygen on discharge.  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  Oncology consulted.  Loculated left pleural effusion: Found to have moderate pleural  effusion on the left side with nodularity and mediastinal adenopathy. Likely  bronchogenic carcinoma.   Underwent CT-guided drain placement by IR on 09/02/18.  Fluid analysis shows that is exudative in nature.  Gram stain did not show any organism Chest tube removed. Echocardiogram showed ejection fraction of 65 to 70%.  Grade 1 diastolic dysfunction.  COPD with acute exacerbation: Wheezes persist.  Started on IV Solu-Medrol.  Continue Mucinex.Continue bronchodilators.  She is not on oxygen at home.  Needs oxygen on discharge. We will taper the steroids soon.  Seropositive rheumatoid arthritis: On immunosuppressants at home.  On methotrexate and Plaquenil.  Currently on hold.  Hyperlipidemia: Continue home medication.  Hypertension: Currently blood pressure stable.  Continue home medications.  Tobacco abuse: Continues to smoke.  Counseled for cessation.  Debility/deconditioning:PT following.  Recommending home health  Nutrition Problem: Inadequate oral intake Etiology: acute illness      DVT prophylaxis:SCD Code Status: DNR Family Communication: None present at the bedside Disposition Plan: To home with home health tomorrow   Consultants: Pulmonary, IR  Procedures: CT-guided left pleural drain  Antimicrobials: Cefepime Day 3  Subjective: Patient seen and examined at bedside this morning. She again looked short of breath today.  She has bilateral expiratory wheezes.  Easily desaturates on ambulation. We will arrange home oxygen for her.  She will also be seen by oncology today.  We will monitor her for 1 more night.   Objective: Vitals:   09/05/18 0758 09/05/18 0805 09/05/18 1132 09/05/18 1238  BP:  (!) 183/54  133/71  Pulse:  (!) 103  (!) 101  Resp:  16  (!) 21  Temp:    (!) 97.3 F (36.3 C)  TempSrc:    Oral  SpO2: 96%  94% 96%  Weight:      Height:        Intake/Output Summary (Last 24 hours) at 09/05/2018 1324 Last data filed at 09/05/2018 0300 Gross per 24 hour    Intake 580 ml  Output 760 ml  Net -180 ml   Filed Weights   09/01/18 1754 09/02/18 0534  Weight: 65.8 kg 66 kg    Examination:  General exam: In mild respiratory distress, elderly debilitated female HEENT:PERRL,Oral mucosa moist, Ear/Nose normal on gross exam Respiratory system: Decreased air entry billaterally,scattered wheezes.   Cardiovascular system: S1 & S2 heard, RRR. No JVD, murmurs, rubs, gallops or clicks. No pedal edema. Gastrointestinal system: Abdomen is nondistended, soft and nontender. No organomegaly or masses felt. Normal bowel sounds heard. Central nervous system: Alert and oriented. No focal neurological deficits. Extremities: No edema, no clubbing ,no cyanosis, distal peripheral pulses palpable. Skin: No rashes, lesions or ulcers,no icterus ,no pallor MSK: Normal muscle bulk,tone ,power Psychiatry: Judgement and insight appear normal. Mood & affect appropriate.     Data Reviewed: I have personally reviewed following labs and imaging studies  CBC: Recent Labs  Lab 09/01/18 1816 09/02/18 0315 09/05/18 0400  WBC 8.0 10.0 11.1*  NEUTROABS 6.9  --  10.0*  HGB 13.8 12.5 13.0  HCT 39.9 35.4* 38.8  MCV 91.7 89.6 92.8  PLT 282 268 154   Basic Metabolic Panel: Recent Labs  Lab 09/01/18 1816 09/02/18 0315 09/03/18 0345 09/05/18 0400  NA 130* 131* 132* 135  K 3.5 3.5 4.0 5.1  CL 94* 95* 99 100  CO2 24 25 23 26   GLUCOSE 158* 136* 128* 132*  BUN 14 16 20  26*  CREATININE 0.75 0.76 0.67 0.71  CALCIUM 9.4 8.9 8.3* 8.5*  MG  --  2.1  --   --   PHOS  --  3.8  --   --    GFR: Estimated Creatinine Clearance: 51.7 mL/min (by C-G formula based on SCr of 0.71 mg/dL). Liver Function Tests: Recent Labs  Lab 09/02/18 0315 09/02/18 1013  AST 17  --   ALT 18  --   ALKPHOS 43  --   BILITOT 1.2  --   PROT 6.8 7.2  ALBUMIN 2.9* 3.0*   No results for input(s): LIPASE, AMYLASE in the last 168 hours. No results for input(s): AMMONIA in the last 168  hours. Coagulation Profile: Recent Labs  Lab 09/02/18 0031  INR 0.98   Cardiac Enzymes: Recent Labs  Lab 09/01/18 2019 09/02/18 0315 09/02/18 1013  TROPONINI <0.03 <0.03 <0.03   BNP (last 3 results) No results for input(s): PROBNP in the last 8760 hours. HbA1C: No results for input(s): HGBA1C in the last 72 hours. CBG: No results for input(s): GLUCAP in the last 168 hours. Lipid Profile: No results for input(s): CHOL, HDL, LDLCALC, TRIG, CHOLHDL, LDLDIRECT in the last 72 hours. Thyroid Function Tests: No results for input(s): TSH, T4TOTAL, FREET4, T3FREE, THYROIDAB in the last 72 hours. Anemia Panel: No results for input(s): VITAMINB12, FOLATE, FERRITIN, TIBC, IRON, RETICCTPCT in the last 72 hours. Sepsis Labs: Recent Labs  Lab 09/02/18 0031 09/02/18 0315  PROCALCITON <0.10  --   LATICACIDVEN 2.9* 1.1    Recent Results (from the past 240 hour(s))  Blood culture (routine x 2)     Status: None (Preliminary result)   Collection Time: 09/01/18  8:35 PM  Result Value Ref Range Status   Specimen Description BLOOD RIGHT ANTECUBITAL  Final   Special Requests   Final  BOTTLES DRAWN AEROBIC AND ANAEROBIC Blood Culture adequate volume   Culture   Final    NO GROWTH 4 DAYS Performed at [facility], [place] [address] [place], [place] [address]    Report Status PENDING  Incomplete  Blood culture (routine x 2)     Status: None (Preliminary result)   Collection Time: 09/01/18  9:27 PM  Result Value Ref Range Status   Specimen Description BLOOD RIGHT HAND  Final   Special Requests   Final    BOTTLES DRAWN AEROBIC AND ANAEROBIC Blood Culture adequate volume   Culture   Final    NO GROWTH 4 DAYS Performed at [facility], [place] [address], [place], [place] [address]    Report Status PENDING  Incomplete  Respiratory Panel by PCR     Status: None   Collection Time: 09/02/18  1:19 AM  Result Value Ref Range Status   Adenovirus NOT DETECTED NOT DETECTED Final    Coronavirus 229E NOT DETECTED NOT DETECTED Final   Coronavirus HKU1 NOT DETECTED NOT DETECTED Final   Coronavirus NL63 NOT DETECTED NOT DETECTED Final   Coronavirus OC43 NOT DETECTED NOT DETECTED Final   Metapneumovirus NOT DETECTED NOT DETECTED Final   Rhinovirus / Enterovirus NOT DETECTED NOT DETECTED Final   Influenza A NOT DETECTED NOT DETECTED Final   Influenza B NOT DETECTED NOT DETECTED Final   Parainfluenza Virus 1 NOT DETECTED NOT DETECTED Final   Parainfluenza Virus 2 NOT DETECTED NOT DETECTED Final   Parainfluenza Virus 3 NOT DETECTED NOT DETECTED Final   Parainfluenza Virus 4 NOT DETECTED NOT DETECTED Final   Respiratory Syncytial Virus NOT DETECTED NOT DETECTED Final   Bordetella pertussis NOT DETECTED NOT DETECTED Final   Chlamydophila pneumoniae NOT DETECTED NOT DETECTED Final   Mycoplasma pneumoniae NOT DETECTED NOT DETECTED Final  MRSA PCR Screening     Status: None   Collection Time: 09/02/18  1:19 AM  Result Value Ref Range Status   MRSA by PCR NEGATIVE NEGATIVE Final    Comment:        The GeneXpert MRSA Assay (FDA approved for NASAL specimens only), is one component of a comprehensive MRSA colonization surveillance program. It is not intended to diagnose MRSA infection nor to guide or monitor treatment for MRSA infections. Performed at [facility], [place] [address], [place], [place] [address]   Gram stain     Status: None   Collection Time: 09/02/18 12:37 PM  Result Value Ref Range Status   Specimen Description FLUID PLEURAL  Final   Special Requests NONE  Final   Gram Stain   Final    ABUNDANT WBC PRESENT, PREDOMINANTLY MONONUCLEAR NO ORGANISMS SEEN Performed at [facility], [place] [address], [place], [place] [address]    Report Status 09/02/2018 FINAL  Final  Culture, body fluid-bottle     Status: None (Preliminary result)   Collection Time: 09/02/18 12:37 PM  Result Value Ref Range Status   Specimen Description FLUID PLEURAL  Final    Special Requests NONE  Final   Culture   Final    NO GROWTH 3 DAYS Performed at [place] [address] [place], [place] [address]    Report Status PENDING  Incomplete  Culture, sputum-assessment     Status: None   Collection Time: 09/03/18  1:01 PM  Result Value Ref Range Status   Specimen Description SPUTUM  Final   Special Requests Immunocompromised  Final  Sputum evaluation   Final    Sputum specimen not acceptable for testing.  Please recollect.   RESULT CALLED TO, READ BACK BY AND VERIFIED WITH: T PAPAKY RN 09/03/18 1957 JDW Performed at [facility], [place] [address], [place], [place] [address]    Report Status 09/03/2018 FINAL  Final         Radiology Studies: Dg Chest Port 1 View  Result Date: 09/03/2018 CLINICAL DATA:  Pleural effusion EXAM: PORTABLE CHEST 1 VIEW COMPARISON:  CT chest 09/01/2018 FINDINGS: The lungs are hyperinflated likely secondary to COPD. There is a small left pleural effusion. There is a left pleural drainage catheter present. There is chronic bilateral interstitial thickening. There is no pneumothorax. The heart mediastinum are stable. The osseous structures are unremarkable. IMPRESSION: 1. Small left pleural effusion with a left pleural drainage catheter present. No pneumothorax. Electronically Signed   By: Kathreen Devoid   On: 09/03/2018 16:04   Vas [place] Lower Extremity Venous (dvt)  Result Date: 09/04/2018  Lower Venous Study Indications: Edema.  Performing Technologist: Oliver Hum RVT  Examination Guidelines: A complete evaluation includes B-mode imaging, spectral Doppler, color Doppler, and power Doppler as needed of all accessible portions of each vessel. Bilateral testing is considered an integral part of a complete examination. Limited examinations for reoccurring indications may be performed as noted.  Right Venous Findings: +---------+---------------+---------+-----------+----------+-------+           CompressibilityPhasicitySpontaneityPropertiesSummary +---------+---------------+---------+-----------+----------+-------+ CFV      Full           Yes      Yes                          +---------+---------------+---------+-----------+----------+-------+ SFJ      Full                                                 +---------+---------------+---------+-----------+----------+-------+ FV Prox  Full                                                 +---------+---------------+---------+-----------+----------+-------+ FV Mid   Full                                                 +---------+---------------+---------+-----------+----------+-------+ FV DistalFull                                                 +---------+---------------+---------+-----------+----------+-------+ PFV      Full                                                 +---------+---------------+---------+-----------+----------+-------+ POP      Full           Yes      Yes                          +---------+---------------+---------+-----------+----------+-------+  PTV      Full                                                 +---------+---------------+---------+-----------+----------+-------+ PERO     Full                                                 +---------+---------------+---------+-----------+----------+-------+  Left Venous Findings: +---------+---------------+---------+-----------+----------+-------+          CompressibilityPhasicitySpontaneityPropertiesSummary +---------+---------------+---------+-----------+----------+-------+ CFV      Full           Yes      Yes                          +---------+---------------+---------+-----------+----------+-------+ SFJ      Full                                                 +---------+---------------+---------+-----------+----------+-------+ FV Prox  Full                                                  +---------+---------------+---------+-----------+----------+-------+ FV Mid   Full                                                 +---------+---------------+---------+-----------+----------+-------+ FV DistalFull                                                 +---------+---------------+---------+-----------+----------+-------+ PFV      Full                                                 +---------+---------------+---------+-----------+----------+-------+ POP      Full           Yes      Yes                          +---------+---------------+---------+-----------+----------+-------+ PTV      Full                                                 +---------+---------------+---------+-----------+----------+-------+ PERO     Full                                                 +---------+---------------+---------+-----------+----------+-------+  Summary: Right: There is no evidence of deep vein thrombosis in the lower extremity. No cystic structure found in the popliteal fossa. Left: There is no evidence of deep vein thrombosis in the lower extremity. No cystic structure found in the popliteal fossa.  *See table(s) above for measurements and observations. Electronically signed by Sherren Mocha MD on 09/04/2018 at 8:09:27 PM.    Final         Scheduled Meds: . ALPRAZolam  0.75 mg Oral QHS  . amLODipine  5 mg Oral Daily  . amoxicillin-clavulanate  1 tablet Oral Q12H  . atorvastatin  40 mg Oral QPM  . famotidine  10 mg Oral Daily  . feeding supplement (ENSURE ENLIVE)  237 mL Oral BID BM  . fluticasone  1 spray Each Nare Daily  . gabapentin  100 mg Oral TID  . guaiFENesin  600 mg Oral BID  . ipratropium-albuterol  3 mL Nebulization Q4H  . loratadine  10 mg Oral Daily  . methylPREDNISolone (SOLU-MEDROL) injection  40 mg Intravenous Q12H  . multivitamin with minerals  1 tablet Oral Daily   Continuous Infusions:    LOS: 4 days    Time spent: 35 mins.More  than 50% of that time was spent in counseling and/or coordination of care.      Shelly Coss, MD [facility] Pager [phone]  If 7PM-7AM, please contact night-coverage [email or web address] Password [email or web address] 09/05/2018, 1:24 PM

## 2018-09-05 NOTE — Progress Notes (Signed)
Occupational Therapy Treatment Patient Details Name: Donna Mcneil MRN: [id number] DOB: November 09, 1940 Today's Date: 09/05/2018    History of present illness Pt is a 78 y.o. F with significant PMH of COPD, RA on methotrexate, lupus who presents with dyspnea and cough mucus production. Unable to drain fluid with thoracentesis attempt. CT shows moderate volume, loculated left pleural effusion. Plan to proceed with CT guided pigtail pleural drainage catheter placement to drain fluid.    OT comments  Pt required max encouragement for mobility. Pt attempting to decline ambulation stating "I've  got to get my thoughts together and will walk on her own time". Reiterated to pt the benefits of OOB activity, pt agreeable to ambulate to bathroom. Pt trialed on RA during activity per RN request. Pt donned socks seated EOB on RA and O2 SATs dropped to 87-88% and pt instructed on deep breathing techniques until recover to >90%. Placed nasal canula 3 L O2 back on pt prior to sit - stand. Pt ambulated to bathroom to participate in toileting tasks and stood at sink for grooming and hygiene tasks. Pt educated on energy conservation with handout provided. OT will continue to follow acutely Patient Saturations on Room Air at Rest = 92%  Patient Saturations on Room Air while Ambulating = 87%  Patient Saturations on 3 Liters of oxygen while Ambulating = 89%     Follow Up Recommendations  Home health OT;Supervision - Intermittent    Equipment Recommendations  None recommended by OT    Recommendations for Other Services      Precautions / Restrictions Precautions Precautions: Fall Precaution Comments: watch O2, HR Restrictions Weight Bearing Restrictions: No       Mobility Bed Mobility Overal bed mobility: Modified Independent             General bed mobility comments: Sitting on EOB upon arrival  Transfers Overall transfer level: Needs assistance Equipment used: Rolling walker (2  wheeled) Transfers: Sit to/from Stand Sit to Stand: Supervision              Balance Overall balance assessment: Needs assistance Sitting-balance support: Feet supported Sitting balance-Leahy Scale: Good     Standing balance support: Bilateral upper extremity supported;During functional activity Standing balance-Leahy Scale: Poor                             ADL either performed or assessed with clinical judgement   ADL Overall ADL's : Needs assistance/impaired     Grooming: Wash/dry face;Wash/dry hands;Standing;Supervision/safety;Set up       Lower Body Bathing: Minimal assistance;Sitting/lateral leans;Sit to/from stand;Min guard       Lower Body Dressing: Minimal assistance;Min guard;Sitting/lateral leans;Sit to/from stand Lower Body Dressing Details (indicate cue type and reason): donned socks with sup; pt's O2 dropping Toilet Transfer: Supervision/safety;Ambulation;RW;Comfort height toilet;Grab bars   Toileting- Clothing Manipulation and Hygiene: Sit to/from stand;Supervision/safety       Functional mobility during ADLs: Supervision/safety;Rolling walker General ADL Comments: pt provided with energy conservation education and handouts     Vision Baseline Vision/History: Wears glasses Wears Glasses: At all times Patient Visual Report: No change from baseline     Perception     Praxis      Cognition Arousal/Alertness: Awake/alert Behavior During Therapy: WFL for tasks assessed/performed Overall Cognitive Status: Within Functional Limits for tasks assessed  Exercises     Shoulder Instructions       General Comments      Pertinent Vitals/ Pain       Pain Assessment: No/denies pain  Home Living                                          Prior Functioning/Environment              Frequency  Min 2X/week        Progress Toward Goals  OT  Goals(current goals can now be found in the care plan section)  Progress towards OT goals: Progressing toward goals     Plan Discharge plan remains appropriate    Co-evaluation                 AM-PAC OT "6 Clicks" Daily Activity     Outcome Measure   Help from another person eating meals?: None Help from another person taking care of personal grooming?: None Help from another person toileting, which includes using toliet, bedpan, or urinal?: A Little Help from another person bathing (including washing, rinsing, drying)?: A Little Help from another person to put on and taking off regular upper body clothing?: None Help from another person to put on and taking off regular lower body clothing?: A Little 6 Click Score: 21    End of Session Equipment Utilized During Treatment: Oxygen;Gait belt;Rolling walker  OT Visit Diagnosis: Unsteadiness on feet (R26.81);Other abnormalities of gait and mobility (R26.89);Muscle weakness (generalized) (M62.81)   Activity Tolerance Patient tolerated treatment well   Patient Left in bed;with nursing/sitter in room;with family/visitor present(sitting EOB)   Nurse Communication          Time: 4643-1427 OT Time Calculation (min): 33 min  Charges: OT General Charges $OT Visit: 1 Visit OT Treatments $Self Care/Home Management : 8-22 mins     [name] 09/05/2018, 12:56 PM

## 2018-09-05 NOTE — Consult Note (Signed)
[place] [phone]   [phone]  CONSULT NOTE  REFERRING PHYSICIAN: Dr. Shelly Coss  REASON FOR CONSULTATION:  78 years old white female with suspicious metastatic lung cancer.  HPI Donna Mcneil is a 78 y.o. female with past medical history significant for lupus, hypertension, osteoporosis, dyslipidemia, diverticulitis as well as long history of smoking.  The patient presented to the emergency department at [facility] complaining of increasing shortness of breath and chest congestion and flulike symptoms.  Mentions that her shortness of breath has been going on since Thanksgiving and it has been progressively getting worse.  She was treated with Mucinex with no improvement in her condition.  Had CT angiogram of the chest performed at the emergency department and that showed a spiculated 1.9 x 1.6 cm anterior left upper lobe nodule suspicious for primary bronchogenic malignancy.  There was fissural nodularity extending throughout the left anterior lobar fissure.  There was enlarged left hilar lymph node and prominent subcarinal lymph node.  There was also moderate sized partially loculated left pleural effusion with compressive atelectasis in the left lower lobe and to a lesser extent in the upper lobe. The patient also has a small nodules in the right lung including 0.5 cm right apical nodule that is slightly irregular and nonspecific.  On 09/02/2018 the patient underwent CT-guided placement of left pleural drainage catheter for drainage of the loculated left pleural effusion.  A sample of the fluid was sent to the lab for cytologic examination  The final cytology (NZA 20-11) showed atypical cells consistent with adenocarcinoma. I was asked to see the patient today for evaluation and recommendation regarding her condition. When seen today the patient is feeling fine except for the baseline shortness of breath increased with exertion.  She  continues to have mild cough with no hemoptysis.  She denied having any fever or chills.  She has no nausea, vomiting, diarrhea or constipation.  She denied having any headache or visual changes. Family history significant for mother died from alcoholic cirrhosis and father died from heart disease. The patient is single and had 2 sons, one living in [place] and the other is deceased.  She used to work as a Educational psychologist and then she worked for [employer].  Her caregiver is her ex-husband and his wife. The patient has a history of smoking for the last 7 years up to 1.5 pack/day and quit just before this admission.  She has no history of alcohol or drug abuse.  Past Medical History:  Diagnosis Date  . Diverticulitis   . Hypercholesteremia   . Hypertension   . Lupus ([place])   . Osteoporosis   . Tobacco use     Past Surgical History:  Procedure Laterality Date  . ABDOMINAL HYSTERECTOMY  12/1969  . APPENDECTOMY  1957  . CATARACT EXTRACTION    . CHOLECYSTECTOMY  1997  . LAPAROSCOPIC SIGMOID COLECTOMY  2013   secondary to diverticulitis  . spit seed removal    . TONSILLECTOMY  1945    Family History  Problem Relation Age of Onset  . Alcohol abuse Mother   . Cirrhosis Mother   . Heart disease Father   . Hypertension Father   . Gout Father   . Ulcers Father   . Hyperlipidemia Father   . Alcohol abuse Brother   . Heart disease Brother        MI at age 73  . OCD Son   . Prostatitis Paternal Grandfather   .  Pneumonia Paternal Grandfather   . Cancer Son     Social History Social History   Tobacco Use  . Smoking status: Current Every Day Smoker    Packs/day: 0.50    Types: Cigarettes  . Smokeless tobacco: Never Used  . Tobacco comment: started smoking at age 22  Substance Use Topics  . Alcohol use: No    Alcohol/week: 0.0 standard drinks  . Drug use: No    Allergies  Allergen Reactions  . Codeine Other (See Comments)    GI upset  . Cyclosporine Other (See Comments)     Burning eyes  . Other     Other reaction(s): OTHER  . Pregabalin Other (See Comments)    Personality changes-becomes angry  . Zolpidem     Other reaction(s): ITCHING    Current Facility-Administered Medications  Medication Dose Route Frequency Provider Last Rate Last Dose  . acetaminophen (TYLENOL) tablet 650 mg  650 mg Oral Q6H PRN Toy Baker, MD   650 mg at 09/02/18 2043   Or  . acetaminophen (TYLENOL) suppository 650 mg  650 mg Rectal Q6H PRN Toy Baker, MD      . ALPRAZolam Duanne Moron) tablet 0.75 mg  0.75 mg Oral QHS Doutova, Anastassia, MD   0.75 mg at 09/04/18 2217  . amLODipine (NORVASC) tablet 5 mg  5 mg Oral Daily Shelly Coss, MD   5 mg at 09/05/18 1037  . amoxicillin-clavulanate (AUGMENTIN) 875-125 MG per tablet 1 tablet  1 tablet Oral Q12H Adhikari, Amrit, MD   1 tablet at 09/05/18 1116  . atorvastatin (LIPITOR) tablet 40 mg  40 mg Oral QPM Doutova, Anastassia, MD   40 mg at 09/04/18 1603  . famotidine (PEPCID) tablet 10 mg  10 mg Oral Daily Doutova, Anastassia, MD   10 mg at 09/05/18 1036  . feeding supplement (ENSURE ENLIVE) (ENSURE ENLIVE) liquid 237 mL  237 mL Oral BID BM Adhikari, Amrit, MD   237 mL at 09/05/18 1117  . fluticasone (FLONASE) 50 MCG/ACT nasal spray 1 spray  1 spray Each Nare Daily Adhikari, Amrit, MD   1 spray at 09/05/18 1117  . gabapentin (NEURONTIN) capsule 100 mg  100 mg Oral TID Toy Baker, MD   100 mg at 09/05/18 1037  . guaiFENesin (MUCINEX) 12 hr tablet 600 mg  600 mg Oral BID Shelly Coss, MD   600 mg at 09/05/18 1037  . hydrALAZINE (APRESOLINE) injection 5 mg  5 mg Intravenous Q4H PRN Gardiner Barefoot, NP   5 mg at 09/05/18 0701  . HYDROcodone-acetaminophen (NORCO/VICODIN) 5-325 MG per tablet 1-2 tablet  1-2 tablet Oral Q4H PRN Toy Baker, MD   2 tablet at 09/05/18 1036  . ipratropium-albuterol (DUONEB) 0.5-2.5 (3) MG/3ML nebulizer solution 3 mL  3 mL Nebulization Q4H Doutova, Anastassia, MD   3 mL at  09/05/18 1527  . levalbuterol (XOPENEX) nebulizer solution 0.63 mg  0.63 mg Nebulization Q6H PRN Toy Baker, MD   0.63 mg at 09/03/18 1304  . loratadine (CLARITIN) tablet 10 mg  10 mg Oral Daily Doutova, Anastassia, MD   10 mg at 09/05/18 1037  . methylPREDNISolone sodium succinate (SOLU-MEDROL) 40 mg/mL injection 40 mg  40 mg Intravenous Q12H Shelly Coss, MD   40 mg at 09/05/18 1039  . multivitamin with minerals tablet 1 tablet  1 tablet Oral Daily Shelly Coss, MD   1 tablet at 09/05/18 1037  . ondansetron (ZOFRAN) tablet 4 mg  4 mg Oral Q6H PRN Toy Baker, MD  Or  . ondansetron (ZOFRAN) injection 4 mg  4 mg Intravenous Q6H PRN Toy Baker, MD   4 mg at 09/03/18 1719  . phenol (CHLORASEPTIC) mouth spray 1 spray  1 spray Mouth/Throat PRN Shelly Coss, MD        Review of Systems  Constitutional: positive for fatigue Eyes: negative Ears, nose, mouth, throat, and face: negative Respiratory: positive for cough and dyspnea on exertion Cardiovascular: negative Gastrointestinal: negative Genitourinary:negative Integument/breast: negative Hematologic/lymphatic: negative Musculoskeletal:positive for muscle weakness Neurological: negative Behavioral/Psych: negative Endocrine: negative Allergic/Immunologic: negative  Physical Exam  HEN:IDPOE, healthy and mild distress SKIN: skin color, texture, turgor are normal, no rashes or significant lesions HEAD: Normocephalic, No masses, lesions, tenderness or abnormalities EYES: normal, PERRLA, Conjunctiva are pink and non-injected EARS: External ears normal, Canals clear OROPHARYNX:no exudate, no erythema and lips, buccal mucosa, and tongue normal  NECK: supple, no adenopathy, no JVD LYMPH:  no palpable lymphadenopathy, no hepatosplenomegaly BREAST:not examined LUNGS: decreased breath sounds, expiratory wheezes bilaterally HEART: regular rate & rhythm, no murmurs and no gallops ABDOMEN:abdomen soft,  non-tender, normal bowel sounds and no masses or organomegaly BACK: Back symmetric, no curvature., No CVA tenderness EXTREMITIES:no joint deformities, effusion, or inflammation, no edema  NEURO: alert & oriented x 3 with fluent speech, no focal motor/sensory deficits  PERFORMANCE STATUS: ECOG 1  LABORATORY DATA: Lab Results  Component Value Date   WBC 11.1 (H) 09/05/2018   HGB 13.0 09/05/2018   HCT 38.8 09/05/2018   MCV 92.8 09/05/2018   PLT 326 09/05/2018    @LASTCHEM @  RADIOGRAPHIC STUDIES: Ct Angio Chest Pe W/cm &/or Wo Cm  Result Date: 09/01/2018 CLINICAL DATA:  Shortness of breath. EXAM: CT ANGIOGRAPHY CHEST WITH CONTRAST TECHNIQUE: Multidetector CT imaging of the chest was performed using the standard protocol during bolus administration of intravenous contrast. Multiplanar CT image reconstructions and MIPs were obtained to evaluate the vascular anatomy. CONTRAST:  52 mL ISOVUE-370 IOPAMIDOL (ISOVUE-370) INJECTION 76% COMPARISON:  Chest radiograph 09/01/2018 FINDINGS: Cardiovascular: There are no filling defects within the pulmonary arteries to suggest pulmonary embolus. Diffuse atherosclerosis of the thoracic aorta without aneurysm or dissection. Conventional branching pattern from the aortic arch. There are coronary artery calcifications. Heart is normal in size. Small pericardial effusion measures up to 10 mm in depth. Mediastinum/Nodes: Prominent left hilar lymph nodes largest measuring up to 13 mm, image 65 series 5. Prominent subcarinal node measures 12 mm short axis. Small high mediastinal nodes are not enlarged by size criteria. No enlarged right hilar lymph nodes. No axillary adenopathy. Esophagus is decompressed. No visualized thyroid nodule. Lungs/Pleura: Moderate size left pleural effusion is partially loculated. Associated compressive atelectasis in the left lower and to a lesser extent upper lobes. Suspect spiculated 19 x 16 mm anterior left upper lobe nodule image 68 series  6, abutting the pleural surface. Fissural nodularity extends throughout the left interlobar fissure with possible tracking pleural fluid. Dominant fissural nodule measures 9 mm image 35 series 6. Moderate emphysema. There is narrowing of the left upper and lower lobe bronchi with mucous plugging in the lingula. Additional areas of mucous plugging in the right lower lobe. Right middle lobe pleural based nodule measures 4 mm, image 85 series 6. 5 mm right apical nodule, image 19. Additional tiny subpleural nodules in the right lung. Upper Abdomen: Motion artifact through the abdomen. Left adrenal thickening without dominant nodule. Atherosclerotic plaque at the diaphragmatic hiatus causes luminal narrowing of the aorta of greater than 50%. Musculoskeletal: No blastic or destructive lytic lesions.  Review of the MIP images confirms the above findings. IMPRESSION: 1. Suspect spiculated 19 x 16 mm anterior left upper lobe nodule, suspicious for primary bronchogenic malignancy. Fissural nodularity extends throughout the left interlobar fissure. Enlarged left hilar lymph node and prominent subcarinal node. Consider multi disciplinary pulmonary referral for further workup. 2. Moderate size partially loculated left pleural effusion with compressive atelectasis in the left lower and to a lesser extent upper lobes. 3. Small nodules in the right lung, including a 5 mm right apical nodule that is slightly irregular, nonspecific. 4. No pulmonary embolus. 5. Moderate emphysema. Areas of mucous plugging involves the left upper lobe and right lower lobes. 6. Advanced aortic atherosclerosis. Calcified plaque at the diaphragmatic hiatus causes luminal narrowing of greater than 50%. Aortic Atherosclerosis (ICD10-I70.0) and Emphysema (ICD10-J43.9). Electronically Signed   By: Keith Rake M.D.   On: 09/01/2018 22:40   Dg Chest Port 1 View  Result Date: 09/05/2018 CLINICAL DATA:  78 year old female status post left chest tube  removal EXAM: PORTABLE CHEST 1 VIEW COMPARISON:  Multiple prior most recent chest x-ray 09/03/2018, CT 09/02/2018 FINDINGS: Cardiomediastinal silhouette unchanged. Chronic interstitial opacities of the lungs, similar to the prior. Unchanged appearance of ill-defined mixed interstitial and airspace opacity at the left lung base. Interval removal of pigtail drainage catheter from the left pleural space with no visualized pneumothorax. Blunting at the left costophrenic angle persists. Aeration on the right is maintained. IMPRESSION: Interval removal of left thoracostomy tube with no visualized pneumothorax. Similar appearance of mixed interstitial and airspace opacities at the left lung base with likely residual small volume pleural thickening/fluid. Electronically Signed   By: Corrie Mckusick D.O.   On: 09/05/2018 15:15   Dg Chest Port 1 View  Result Date: 09/03/2018 CLINICAL DATA:  Pleural effusion EXAM: PORTABLE CHEST 1 VIEW COMPARISON:  CT chest 09/01/2018 FINDINGS: The lungs are hyperinflated likely secondary to COPD. There is a small left pleural effusion. There is a left pleural drainage catheter present. There is chronic bilateral interstitial thickening. There is no pneumothorax. The heart mediastinum are stable. The osseous structures are unremarkable. IMPRESSION: 1. Small left pleural effusion with a left pleural drainage catheter present. No pneumothorax. Electronically Signed   By: Kathreen Devoid   On: 09/03/2018 16:04   Dg Chest Port 1 View  Result Date: 09/01/2018 CLINICAL DATA:  Short of breath. EXAM: PORTABLE CHEST 1 VIEW COMPARISON:  None. FINDINGS: Cardiac silhouette is partly obscured by left lung base opacity. Is grossly normal in size. No mediastinal or hilar masses. Opacity at the left lung base is consistent with a moderate pleural effusion with associated atelectasis or pneumonia. Lungs show prominent bronchovascular markings and mild lower lung zone interstitial thickening, presumed chronic.  Mild scarring noted at the apices. No convincing right pleural effusion.  No pneumothorax. Skeletal structures are grossly intact. IMPRESSION: 1. Moderate left pleural effusion associated with left lung base atelectasis or pneumonia. 2. No other evidence of acute cardiopulmonary disease. Electronically Signed   By: Lajean Manes M.D.   On: 09/01/2018 18:49   Ct Image Guided Drainage By Percutaneous Catheter  Result Date: 09/02/2018 CLINICAL DATA:  Loculated left pleural effusion and left upper lobe pulmonary nodule. There has been inability to obtain pleural fluid with attempted bedside thoracentesis. Request has been made to place a pleural drainage catheter. EXAM: CT GUIDED LEFT PLEURAL DRAINAGE CATHETER PLACEMENT ANESTHESIA/SEDATION: 4.0 mg IV Versed 100 mcg IV Fentanyl Total Moderate Sedation Time:  24 minutes The patient's level of consciousness and  physiologic status were continuously monitored during the procedure by Radiology nursing. PROCEDURE: The procedure, risks, benefits, and alternatives were explained to the patient. Questions regarding the procedure were encouraged and answered. The patient understands and consents to the procedure. A time out was performed prior to initiating the procedure. CT was performed in a supine position with the left side rolled up. The left lateral chest wall was prepped with chlorhexidine in a sterile fashion, and a sterile drape was applied covering the operative field. A sterile gown and sterile gloves were used for the procedure. Local anesthesia was provided with 1% Lidocaine. Under CT guidance, an 18 gauge trocar needle was advanced into the left pleural space. Fluid was aspirated. A guidewire was advanced in the needle removed. The percutaneous tract was dilated over the guidewire and a 12 French pigtail drainage catheter placed over the wire. Catheter position was confirmed by CT. Additional fluid was aspirated and sent for requested laboratory testing. The  catheter was then connected to a [place] Pleur-evac and wall suction at -20 cm water. COMPLICATIONS: None FINDINGS: Initial aspiration yielded grossly bloody left pleural fluid. After placement of the pigtail pleural drain, there is good return of bloody fluid. A sample of 120 mL of fluid was sent for requested laboratory testing. IMPRESSION: CT-guided placement of left pleural drainage catheter for drainage of a loculated left pleural effusion. There was return of grossly bloody pleural fluid. A 12 French catheter was placed and attached to a Pleur-evac device and wall suction at -20 cm of water. A sample of fluid was sent for requested laboratory testing. Electronically Signed   By: Aletta Edouard M.D.   On: 09/02/2018 12:50   Vas [place] Lower Extremity Venous (dvt)  Result Date: 09/04/2018  Lower Venous Study Indications: Edema.  Performing Technologist: Oliver Hum RVT  Examination Guidelines: A complete evaluation includes B-mode imaging, spectral Doppler, color Doppler, and power Doppler as needed of all accessible portions of each vessel. Bilateral testing is considered an integral part of a complete examination. Limited examinations for reoccurring indications may be performed as noted.  Right Venous Findings: +---------+---------------+---------+-----------+----------+-------+          CompressibilityPhasicitySpontaneityPropertiesSummary +---------+---------------+---------+-----------+----------+-------+ CFV      Full           Yes      Yes                          +---------+---------------+---------+-----------+----------+-------+ SFJ      Full                                                 +---------+---------------+---------+-----------+----------+-------+ FV Prox  Full                                                 +---------+---------------+---------+-----------+----------+-------+ FV Mid   Full                                                  +---------+---------------+---------+-----------+----------+-------+ FV DistalFull                                                 +---------+---------------+---------+-----------+----------+-------+  PFV      Full                                                 +---------+---------------+---------+-----------+----------+-------+ POP      Full           Yes      Yes                          +---------+---------------+---------+-----------+----------+-------+ PTV      Full                                                 +---------+---------------+---------+-----------+----------+-------+ PERO     Full                                                 +---------+---------------+---------+-----------+----------+-------+  Left Venous Findings: +---------+---------------+---------+-----------+----------+-------+          CompressibilityPhasicitySpontaneityPropertiesSummary +---------+---------------+---------+-----------+----------+-------+ CFV      Full           Yes      Yes                          +---------+---------------+---------+-----------+----------+-------+ SFJ      Full                                                 +---------+---------------+---------+-----------+----------+-------+ FV Prox  Full                                                 +---------+---------------+---------+-----------+----------+-------+ FV Mid   Full                                                 +---------+---------------+---------+-----------+----------+-------+ FV DistalFull                                                 +---------+---------------+---------+-----------+----------+-------+ PFV      Full                                                 +---------+---------------+---------+-----------+----------+-------+ POP      Full           Yes      Yes                           +---------+---------------+---------+-----------+----------+-------+  PTV      Full                                                 +---------+---------------+---------+-----------+----------+-------+ PERO     Full                                                 +---------+---------------+---------+-----------+----------+-------+    Summary: Right: There is no evidence of deep vein thrombosis in the lower extremity. No cystic structure found in the popliteal fossa. Left: There is no evidence of deep vein thrombosis in the lower extremity. No cystic structure found in the popliteal fossa.  *See table(s) above for measurements and observations. Electronically signed by Sherren Mocha MD on 09/04/2018 at 8:09:27 PM.    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 I personally and independently reviewed her images and discussed the results with the patient today. I recommended for the patient to complete the staging work-up by ordering a PET scan as well as MRI of the brain on outpatient basis to rule out any other metastatic disease. The the left pleural effusion was suspicious for adenocarcinoma but I am not sure there would be enough tissue for molecular studies.  We may have to consider repeat sampling of the pleural fluid for molecular studies and PDL 1 expression. I will arrange for the patient to come to the cancer center for follow-up visit for further evaluation of her treatment options including palliative care or palliative systemic chemotherapy. For the recurrent left pleural  effusion, she will continue drainage via the Pleurx catheter.  We will need to see if some of these fluid for cytologic evaluation and molecular studies with the upcoming drainage. For smoking cessation, I strongly encouraged the patient to quit smoking. The patient voices understanding of current disease status and treatment options and is in agreement with the current care plan.  All questions were answered. The patient knows to call the clinic with any problems, questions or concerns. We can certainly see the patient much sooner if necessary.  Thank you so much for allowing me to participate in the care of Donna Mcneil. I will continue to follow up the patient with you and assist in her care.  Disclaimer: This note was dictated with voice recognition software. Similar sounding words can inadvertently be transcribed and may not be corrected upon review.   Eilleen Kempf September 05, 2018, 3:31 PM

## 2018-09-05 NOTE — Progress Notes (Signed)
Physical Therapy Treatment Patient Details Name: Donna Mcneil MRN: [id number] DOB: 04/17/1941 Today's Date: 09/05/2018    History of Present Illness Pt is a 78 y.o. F with significant PMH of COPD, RA on methotrexate, lupus who presents with dyspnea and cough mucus production. Unable to drain fluid with thoracentesis attempt. CT shows moderate volume, loculated left pleural effusion. Plan to proceed with CT guided pigtail pleural drainage catheter placement to drain fluid.     PT Comments    Pt was measured on O2 and off as follows: SATURATION QUALIFICATIONS: (This note is used to comply with regulatory documentation for home oxygen)  Patient Saturations on Room Air at Rest = 92%  Patient Saturations on Room Air while Donning socks = 87%  Patient Saturations on 3 Liters of oxygen while Ambulating = 89%  Please briefly explain why patient needs home oxygen:  Her sats do not even support lower body dressing with no supplemental O2.   Follow Up Recommendations  Home health PT;Supervision - Intermittent     Equipment Recommendations  None recommended by PT    Recommendations for Other Services       Precautions / Restrictions Precautions Precautions: Fall Precaution Comments: watch O2, HR Restrictions Weight Bearing Restrictions: No    Mobility  Bed Mobility Overal bed mobility: Modified Independent             General bed mobility comments: Sitting on EOB upon arrival  Transfers Overall transfer level: Needs assistance Equipment used: Rolling walker (2 wheeled);1 person hand held assist Transfers: Sit to/from Stand Sit to Stand: Min guard(for safety)            Ambulation/Gait Ambulation/Gait assistance: Min guard Gait Distance (Feet): 40 Feet Assistive device: Rolling walker (2 wheeled) Gait Pattern/deviations: Step-through pattern;Decreased stride length;Trunk flexed Gait velocity: decreased Gait velocity interpretation: <1.31 ft/sec, indicative  of household ambulator General Gait Details: Limited ambulation at bedside with walker and supervision assist.    Stairs             Wheelchair Mobility    Modified Rankin (Stroke Patients Only)       Balance Overall balance assessment: Needs assistance Sitting-balance support: Feet supported Sitting balance-Leahy Scale: Good     Standing balance support: Bilateral upper extremity supported;During functional activity Standing balance-Leahy Scale: Poor                              Cognition Arousal/Alertness: Awake/alert Behavior During Therapy: WFL for tasks assessed/performed Overall Cognitive Status: Within Functional Limits for tasks assessed                                        Exercises      General Comments General comments (skin integrity, edema, etc.): Pt walked with no O2, but at rest with O2 removed was 89% and 87% without O2 while putting on her socks.  Walking with no O2 was not done, and with O2 3L walking was 89%.        Pertinent Vitals/Pain Pain Assessment: No/denies pain    Home Living                      Prior Function            PT Goals (current goals can now be found in the care plan section) Acute  Rehab PT Goals Patient Stated Goal: "Be able to return home" Progress towards PT goals: Progressing toward goals    Frequency    Min 3X/week      PT Plan Current plan remains appropriate    Co-evaluation              AM-PAC PT "6 Clicks" Mobility   Outcome Measure  Help needed turning from your back to your side while in a flat bed without using bedrails?: None Help needed moving from lying on your back to sitting on the side of a flat bed without using bedrails?: None Help needed moving to and from a bed to a chair (including a wheelchair)?: A Little Help needed standing up from a chair using your arms (e.g., wheelchair or bedside chair)?: A Little Help needed to walk in hospital  room?: A Little Help needed climbing 3-5 steps with a railing? : A Lot 6 Click Score: 19    End of Session Equipment Utilized During Treatment: Gait belt;Oxygen Activity Tolerance: Treatment limited secondary to medical complications (Comment)(O2 sat drops)   Nurse Communication: Mobility status;Other (comment)(O2 sats) PT Visit Diagnosis: Difficulty in walking, not elsewhere classified (R26.2)     Time: 7591-6384 PT Time Calculation (min) (ACUTE ONLY): 21 min  Charges:  $Gait Training: 8-22 mins                   Donna Mcneil 09/05/2018, 1:16 PM   Donna Mcneil, PT MS Acute Rehab Dept. Number: [place]

## 2018-09-05 NOTE — Progress Notes (Signed)
Pre-procedure dx: pleural effusion Post-procedure dx: same  Successful bedside removal of left sided chest tube.  No immediate complications. Patient tolerated procedure well. EBL: <1 mL  Follow up CXR ordered for 1300.  Please call IR with questions or concerns.  Candiss Norse, PA-C

## 2018-09-05 NOTE — Progress Notes (Signed)
Received order for Hydralazine 5mg  IV prn, administered as ordered. Will pass it on to oncoming RN. Pt stated its because of anticipation of going home, and taking chest tube out.

## 2018-09-05 NOTE — Care Management Note (Signed)
Case Management Note Marvetta Gibbons RN, BSN Transitions of Care Unit 4E- RN Case Manager [phone]  Patient Details  Name: Donna Mcneil MRN: [id number] Date of Birth: December 16, 1940  Subjective/Objective:    Pt admitted with COPD,  Acute reso, failure, possible lung cancer pending cytology               Action/Plan: PTA pt lived at home, has family support for transition home. Per PT/OT notes recommendations for [facility] and home 02 needs- order has been placed for Home 02- will need HH orders placed prior to discharge. CM spoke with pt and ex-husband at bedside- discussed transition of care needs- choice offered for both DME and HH needs- list provided for [facility] agencies- Per CMS website with star ratings (copy placed in shadow chart)- pt wants to use [facility] for both [facility] and home 02 needs- referral has been called to [facility] with [facility] for [facility]- pending [place] orders, also spoke with Brad with [facility] for Home 02 needs- portable tank to be delivered to room prior to discharge- pt would like to be assessed for small portable tank/concentrator which will be done at home. Pt has all other DME at home per pt. CM also provided Medicaid PCS application per request which she will f/u with PCP for.   Expected Discharge Date:                  Expected Discharge Plan:  [place]  In-House Referral:  NA  Discharge planning Services  CM Consult  Post Acute Care Choice:    Choice offered to:     DME Arranged:  Oxygen DME Agency:  [place]:    [facility] Agency:  [place]  Status of Service:  In process, will continue to follow  If discussed at Long Length of Stay Meetings, dates discussed:    Discharge Disposition: home/home health   Additional Comments:  Dawayne Patricia, RN 09/05/2018, 4:12 PM

## 2018-09-05 NOTE — Progress Notes (Signed)
On call [facility] MD text paged with pt's BP (178/59, 182/65), notified pt got anxious after using bedside commode. Pain medication administered but BP still high with resting. Waiting on MD to call back. Will continue to monitor.

## 2018-09-06 ENCOUNTER — Ambulatory Visit: Payer: Self-pay | Admitting: Family Medicine

## 2018-09-06 DIAGNOSIS — J181 Lobar pneumonia, unspecified organism: Secondary | ICD-10-CM

## 2018-09-06 DIAGNOSIS — J189 Pneumonia, unspecified organism: Secondary | ICD-10-CM

## 2018-09-06 LAB — CULTURE, BLOOD (ROUTINE X 2)
Culture: NO GROWTH
Culture: NO GROWTH
SPECIAL REQUESTS: ADEQUATE
Special Requests: ADEQUATE

## 2018-09-06 MED ORDER — GUAIFENESIN ER 600 MG PO TB12: 600.0000 mg | ORAL_TABLET | Freq: Two times a day (BID) | ORAL | 0 refills | Status: DC

## 2018-09-06 MED ORDER — PREDNISONE 10 MG PO TABS: ORAL_TABLET | ORAL | 0 refills | Status: DC

## 2018-09-06 MED ORDER — AMLODIPINE BESYLATE 10 MG PO TABS: 10.0000 mg | ORAL_TABLET | Freq: Every day | ORAL | 0 refills | Status: DC

## 2018-09-06 MED ORDER — HYDROCHLOROTHIAZIDE 25 MG PO TABS: 50.0000 mg | ORAL_TABLET | Freq: Every day | ORAL | Status: DC

## 2018-09-06 MED ORDER — AMLODIPINE BESYLATE 10 MG PO TABS: 10.0000 mg | ORAL_TABLET | Freq: Every day | ORAL | Status: DC

## 2018-09-06 MED ORDER — AMOXICILLIN-POT CLAVULANATE 875-125 MG PO TABS: 1.0000 | ORAL_TABLET | Freq: Two times a day (BID) | ORAL | 0 refills | Status: DC

## 2018-09-06 NOTE — Progress Notes (Signed)
Pt provided with discharge instructions and prescription. Telebox removed/ CCMD notified. IV removed and intact. Pt has home o2 and supplies. Applied via [place] @ 3lpm. Pt denies any complaints. Pt tx via wheel chair to valet to meet ride.  Jerald Kief, RN

## 2018-09-06 NOTE — Progress Notes (Addendum)
PROGRESS NOTE    Donna Mcneil  JAS:[id number] DOB: [birth year] DOA: 09/01/2018 PCP: [name], PA   Brief Narrative: Patient is a 78 year old female with past medical COPD, RA on methotrexate and Plaquenil, cutaneous lupus, coronary artery disease, peripheral antibiotic, hyperlipidemia, hypertension, tobacco abuse who presented with complaints of shortness of breath from home.  She reported cold-like symptoms for last 1 week.  Also was having productive cough with yellow sputum.  She failed outpatient antibiotic therapy with azithromycin.  No report of fever or chills at home.  Follows with rheumatology outpatient.Continues to smoke despite diagnosis of COPD.  Reported to have lost around 30 pounds over the last several months.  On presentation she was found to have Left-sided loculated pleural effusion with nodularity in the left lung and metastatic adenopathy.  Pulmonary consulted.  Attempted for bedside left-sided thoracentesis which failed to drain.  IR consulted.  Underwent CT-guided drain placement on 09/02/18. Cytology from the pleural fluid showed atypical cells suspicious for adenocarcinoma.  Oncology consulted.  Oncology felt that the cytology sample was not adequate for molecular diagnosis.  Pulmonology again consulted for repeat sampling of the pleural fluid for molecular studies and PDL 1 expression.  Assessment & Plan:   Active Problems:   Seropositive rheumatoid arthritis (HCC)   Hyperlipidemia   Hypertension   Tobacco use   Rheumatoid arthritis involving multiple sites with positive rheumatoid factor (HCC)   COPD exacerbation (HCC)   Acute respiratory failure (HCC)   Hyponatremia   Malignant pleural effusion   Pulmonary nodule  Acute Respiratory failure with hypoxia: Multifactorial secondary to left-sided pleural effusion, COPD,lung cancer.  Continue to monitor respiratory status.  Saturating fine on supplemental oxygen.  She needs oxygen on discharge.  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  Oncology consulted.  Oncology felt that the cytology sample was not adequate for molecular diagnosis.  Pulmonology again consulted for repeat sampling of the pleural fluid for molecular studies and PDL 1 expression. She will be followed by oncology as an outpatient for further management plan.  Loculated left pleural effusion: Found to have moderate pleural effusion on the left side with nodularity and mediastinal adenopathy. Likely  bronchogenic carcinoma.   Underwent CT-guided drain placement by IR on 09/02/18.  Fluid analysis shows that is exudative in nature.  Gram stain did not show any organism Chest tube removed. Echocardiogram showed ejection fraction of 65 to 70%.  Grade 1 diastolic dysfunction.  COPD with acute exacerbation: Wheezes persist.  Started on IV Solu-Medrol.  Continue Mucinex.Continue bronchodilators.  She is not on oxygen at home.  Needs oxygen on discharge. We will taper the steroids soon.  Seropositive rheumatoid arthritis: On immunosuppressants at home.  On methotrexate and Plaquenil.  Currently on hold.  Hyperlipidemia: Continue home medication.  Hypertension: Noted to be hypertensive.  Increased the dose of amlodipine.Continue HCTZ  Tobacco abuse: Continues to smoke.  Counseled for cessation.  Debility/deconditioning:PT following.  Recommending home health  Nutrition Problem: Inadequate oral intake Etiology: acute illness      DVT prophylaxis:SCD Code Status: DNR Family Communication: None present at the bedside Disposition Plan: To home with home health tomorrow   Consultants: Pulmonary, IR  Procedures: CT-guided left pleural drain  Antimicrobials: Cefepime Day 3  Subjective: Patient seen and examined at bedside this morning. She again looked short of breath today.  Easily desaturates on ambulation. She desperately wants to go home.  Extensive  discussion done at  the bedside along with the friend/family about the necessity of waiting for pulmonary recommendation regarding obtaining tissue sample again for definitive diagnosis.  She was noted to be in sinus tachycardia, short of breath even on rest and clearly not stable for discharge home today.   Objective: Vitals:   09/06/18 0732 09/06/18 0750 09/06/18 1029 09/06/18 1311  BP:  (!) 160/69 (!) 167/97   Pulse:  (!) 115    Resp:  16    Temp:  97.7 F (36.5 C)    TempSrc:  Oral    SpO2: 98% 96%  99%  Weight:      Height:        Intake/Output Summary (Last 24 hours) at 09/06/2018 1408 Last data filed at 09/06/2018 0936 Gross per 24 hour  Intake 220 ml  Output 400 ml  Net -180 ml   Filed Weights   09/01/18 1754 09/02/18 0534  Weight: 65.8 kg 66 kg    Examination:  General exam: In mild to moderate respiratory distress, elderly debilitated female HEENT:PERRL,Oral mucosa moist, Ear/Nose normal on gross exam Respiratory system: Decreased air entry billaterally,scattered wheezes.   Cardiovascular system: Sinus tachycardia, RRR. No JVD, murmurs, rubs, gallops or clicks. No pedal edema. Gastrointestinal system: Abdomen is nondistended, soft and nontender. No organomegaly or masses felt. Normal bowel sounds heard. Central nervous system: Alert and oriented. No focal neurological deficits. Extremities: No edema, no clubbing ,no cyanosis, distal peripheral pulses palpable. Skin: No rashes, lesions or ulcers,no icterus ,no pallor MSK: Normal muscle bulk,tone ,power Psychiatry: Judgement and insight appear normal. Mood & affect appropriate.     Data Reviewed: I have personally reviewed following labs and imaging studies  CBC: Recent Labs  Lab 09/01/18 1816 09/02/18 0315 09/05/18 0400  WBC 8.0 10.0 11.1*  NEUTROABS 6.9  --  10.0*  HGB 13.8 12.5 13.0  HCT 39.9 35.4* 38.8  MCV 91.7 89.6 92.8  PLT 282 268 481   Basic Metabolic Panel: Recent Labs  Lab 09/01/18 1816  09/02/18 0315 09/03/18 0345 09/05/18 0400  NA 130* 131* 132* 135  K 3.5 3.5 4.0 5.1  CL 94* 95* 99 100  CO2 24 25 23 26   GLUCOSE 158* 136* 128* 132*  BUN 14 16 20  26*  CREATININE 0.75 0.76 0.67 0.71  CALCIUM 9.4 8.9 8.3* 8.5*  MG  --  2.1  --   --   PHOS  --  3.8  --   --    GFR: Estimated Creatinine Clearance: 51.7 mL/min (by C-G formula based on SCr of 0.71 mg/dL). Liver Function Tests: Recent Labs  Lab 09/02/18 0315 09/02/18 1013  AST 17  --   ALT 18  --   ALKPHOS 43  --   BILITOT 1.2  --   PROT 6.8 7.2  ALBUMIN 2.9* 3.0*   No results for input(s): LIPASE, AMYLASE in the last 168 hours. No results for input(s): AMMONIA in the last 168 hours. Coagulation Profile: Recent Labs  Lab 09/02/18 0031  INR 0.98   Cardiac Enzymes: Recent Labs  Lab 09/01/18 2019 09/02/18 0315 09/02/18 1013  TROPONINI <0.03 <0.03 <0.03   BNP (last 3 results) No results for input(s): PROBNP in the last 8760 hours. HbA1C: No results for input(s): HGBA1C in the last 72 hours. CBG: No results for input(s): GLUCAP in the last 168 hours. Lipid Profile: No results for input(s): CHOL, HDL, LDLCALC, TRIG, CHOLHDL, LDLDIRECT in the last 72 hours. Thyroid Function Tests: No results for input(s): TSH, T4TOTAL, FREET4, T3FREE,  THYROIDAB in the last 72 hours. Anemia Panel: No results for input(s): VITAMINB12, FOLATE, FERRITIN, TIBC, IRON, RETICCTPCT in the last 72 hours. Sepsis Labs: Recent Labs  Lab 09/02/18 0031 09/02/18 0315  PROCALCITON <0.10  --   LATICACIDVEN 2.9* 1.1    Recent Results (from the past 240 hour(s))  Blood culture (routine x 2)     Status: None   Collection Time: 09/01/18  8:35 PM  Result Value Ref Range Status   Specimen Description BLOOD RIGHT ANTECUBITAL  Final   Special Requests   Final    BOTTLES DRAWN AEROBIC AND ANAEROBIC Blood Culture adequate volume   Culture   Final    NO GROWTH 5 DAYS Performed at [facility], [place] [address], [place],  [place] [address]    Report Status 09/06/2018 FINAL  Final  Blood culture (routine x 2)     Status: None   Collection Time: 09/01/18  9:27 PM  Result Value Ref Range Status   Specimen Description BLOOD RIGHT HAND  Final   Special Requests   Final    BOTTLES DRAWN AEROBIC AND ANAEROBIC Blood Culture adequate volume   Culture   Final    NO GROWTH 5 DAYS Performed at [facility], [place] [address] [place], [place] [address]    Report Status 09/06/2018 FINAL  Final  Respiratory Panel by PCR     Status: None   Collection Time: 09/02/18  1:19 AM  Result Value Ref Range Status   Adenovirus NOT DETECTED NOT DETECTED Final   Coronavirus 229E NOT DETECTED NOT DETECTED Final   Coronavirus HKU1 NOT DETECTED NOT DETECTED Final   Coronavirus NL63 NOT DETECTED NOT DETECTED Final   Coronavirus OC43 NOT DETECTED NOT DETECTED Final   Metapneumovirus NOT DETECTED NOT DETECTED Final   Rhinovirus / Enterovirus NOT DETECTED NOT DETECTED Final   Influenza A NOT DETECTED NOT DETECTED Final   Influenza B NOT DETECTED NOT DETECTED Final   Parainfluenza Virus 1 NOT DETECTED NOT DETECTED Final   Parainfluenza Virus 2 NOT DETECTED NOT DETECTED Final   Parainfluenza Virus 3 NOT DETECTED NOT DETECTED Final   Parainfluenza Virus 4 NOT DETECTED NOT DETECTED Final   Respiratory Syncytial Virus NOT DETECTED NOT DETECTED Final   Bordetella pertussis NOT DETECTED NOT DETECTED Final   Chlamydophila pneumoniae NOT DETECTED NOT DETECTED Final   Mycoplasma pneumoniae NOT DETECTED NOT DETECTED Final  MRSA PCR Screening     Status: None   Collection Time: 09/02/18  1:19 AM  Result Value Ref Range Status   MRSA by PCR NEGATIVE NEGATIVE Final    Comment:        The GeneXpert MRSA Assay (FDA approved for NASAL specimens only), is one component of a comprehensive MRSA colonization surveillance program. It is not intended to diagnose MRSA infection nor to guide or monitor treatment for MRSA infections. Performed at  [facility], [place] [address], [place], [place] [address]   Fungus Culture With Stain     Status: None (Preliminary result)   Collection Time: 09/02/18  8:06 AM  Result Value Ref Range Status   Fungus Stain Final report  Final    Comment: (NOTE) Performed At: [facility] [place], [place] [id number] Rush Farmer MD IO:[id number]    Fungus (Mycology) Culture PENDING  Incomplete   Fungal Source FLUID  Final  Fungus Culture Result     Status: None   Collection Time: 09/02/18  8:06 AM  Result  Value Ref Range Status   Result 1 Comment  Final    Comment: (NOTE) KOH/Calcofluor preparation:  no fungus observed. Performed At: [facility] [place], [place] [id number] Rush Farmer MD ZH:[id number]   Gram stain     Status: None   Collection Time: 09/02/18 12:37 PM  Result Value Ref Range Status   Specimen Description FLUID PLEURAL  Final   Special Requests NONE  Final   Gram Stain   Final    ABUNDANT WBC PRESENT, PREDOMINANTLY MONONUCLEAR NO ORGANISMS SEEN Performed at [facility], [address] [place], [place] [address]    Report Status 09/02/2018 FINAL  Final  Culture, body fluid-bottle     Status: None (Preliminary result)   Collection Time: 09/02/18 12:37 PM  Result Value Ref Range Status   Specimen Description FLUID PLEURAL  Final   Special Requests NONE  Final   Culture   Final    NO GROWTH 4 DAYS Performed at [place] [address] [place], [place] [address]    Report Status PENDING  Incomplete  Culture, sputum-assessment     Status: None   Collection Time: 09/03/18  1:01 PM  Result Value Ref Range Status   Specimen Description SPUTUM  Final   Special Requests Immunocompromised  Final   Sputum evaluation   Final    Sputum specimen not acceptable for testing.  Please recollect.   RESULT CALLED TO, READ BACK BY AND VERIFIED WITH: T PAPAKY RN 09/03/18 1957 JDW Performed at [facility],  [place] [address], [place], [place] [address]    Report Status 09/03/2018 FINAL  Final         Radiology Studies: Dg Chest Port 1 View  Result Date: 09/05/2018 CLINICAL DATA:  78 year old female status post left chest tube removal EXAM: PORTABLE CHEST 1 VIEW COMPARISON:  Multiple prior most recent chest x-ray 09/03/2018, CT 09/02/2018 FINDINGS: Cardiomediastinal silhouette unchanged. Chronic interstitial opacities of the lungs, similar to the prior. Unchanged appearance of ill-defined mixed interstitial and airspace opacity at the left lung base. Interval removal of pigtail drainage catheter from the left pleural space with no visualized pneumothorax. Blunting at the left costophrenic angle persists. Aeration on the right is maintained. IMPRESSION: Interval removal of left thoracostomy tube with no visualized pneumothorax. Similar appearance of mixed interstitial and airspace opacities at the left lung base with likely residual small volume pleural thickening/fluid. Electronically Signed   By: Corrie Mckusick D.O.   On: 09/05/2018 15:15        Scheduled Meds: . ALPRAZolam  0.75 mg Oral QHS  . [START ON 09/07/2018] amLODipine  10 mg Oral Daily  . amoxicillin-clavulanate  1 tablet Oral Q12H  . atorvastatin  40 mg Oral QPM  . famotidine  10 mg Oral Daily  . feeding supplement (ENSURE ENLIVE)  237 mL Oral BID BM  . fluticasone  1 spray Each Nare Daily  . gabapentin  100 mg Oral TID  . guaiFENesin  600 mg Oral BID  . loratadine  10 mg Oral Daily  . methylPREDNISolone (SOLU-MEDROL) injection  40 mg Intravenous Q12H  . multivitamin with minerals  1 tablet Oral Daily   Continuous Infusions:    LOS: 5 days    Time spent: 35 mins.More than 50% of that time was spent in counseling and/or coordination of care.      Shelly Coss, MD [facility] Pager [phone]  If 7PM-7AM, please contact night-coverage [email or web address] Password [facility] 09/06/2018,  2:08 PM

## 2018-09-06 NOTE — Discharge Summary (Signed)
Physician Discharge Summary  Donna Mcneil NWG:[id number] DOB: 1941-08-29 DOA: 09/01/2018  PCP: [name], PA  Admit date: 09/01/2018 Discharge date: 09/06/2018  Admitted From: Home Disposition:  Home  Discharge Condition:Stable CODE STATUS:FULL Diet recommendation: Heart Healthy   Brief/Interim Summary: Patient is a 78 year old female with past medical COPD, RA on methotrexate and Plaquenil, cutaneous lupus, coronary artery disease, peripheral antibiotic, hyperlipidemia, hypertension, tobacco abuse who presented with complaints of shortness of breath from home.  She reported cold-like symptoms for last 1 week.  Also was having productive cough with yellow sputum.  She failed outpatient antibiotic therapy with azithromycin.  No report of fever or chills at home.  Follows with rheumatology outpatient.Continues to smoke despite diagnosis of COPD.  Reported to have lost around 30 pounds over the last several months.  On presentation she was found to have Left-sided loculated pleural effusion with nodularity in the left lung and metastatic adenopathy.  Pulmonary consulted.  Attempted for bedside left-sided thoracentesis which failed to drain.  IR consulted.  Underwent CT-guided drain placement on 09/02/18. Cytology from the pleural fluid showed atypical cells suspicious for adenocarcinoma.  Oncology consulted.  Oncology felt that the cytology sample was not adequate for molecular diagnosis.  Pulmonology again consulted for repeat sampling of the pleural fluid for molecular studies and PDL 1 expression.Plan is to follow her up by pulmonology and oncology as an outpatient.  Pulmonology recommended to discharge her home today.  Following problems were addressed during her hospitalization:  Acute Respiratory failure with hypoxia: Multifactorial secondary to left-sided pleural effusion, COPD,lung cancer.  Continue to monitor respiratory status.  Saturating fine on supplemental oxygen.  She needs  oxygen on discharge.  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  Oncology consulted.  Oncology felt that the cytology sample was not adequate for molecular diagnosis.  Pulmonology again consulted for repeat sampling of the pleural fluid for molecular studies and PDL 1 expression. She will be followed by pulmonology  and oncology as an outpatient for further management plan.  Loculated left pleural effusion: Found to have moderate pleural effusion on the left side with nodularity and mediastinal adenopathy. Likely  bronchogenic carcinoma.   Underwent CT-guided drain placement by IR on 09/02/18.  Fluid analysis shows that is exudative in nature.  Gram stain did not show any organism Chest tube removed. Echocardiogram showed ejection fraction of 65 to 70%.  Grade 1 diastolic dysfunction.  COPD with acute exacerbation: Steroids  changed to oral.  Continue Mucinex.Continue bronchodilators.  She is not on oxygen at home.  Needs oxygen on discharge.  Seropositive rheumatoid arthritis: On immunosuppressants at home.  On methotrexate and Plaquenil.   Hyperlipidemia: Continue home medication.  Hypertension: Noted to be hypertensive.  Increased the dose of amlodipine.Continue HCTZ  Tobacco abuse: Continues to smoke.  Counseled for cessation.  Debility/deconditioning:PT following.  Recommending home health   Discharge Diagnoses:  Active Problems:   Seropositive rheumatoid arthritis (HCC)   Hyperlipidemia   Hypertension   Tobacco use   Rheumatoid arthritis involving multiple sites with positive rheumatoid factor (HCC)   COPD exacerbation (HCC)   Acute respiratory failure (HCC)   Hyponatremia   Malignant pleural effusion   Pulmonary nodule    Discharge Instructions  Discharge Instructions    Diet - low sodium heart healthy   Complete by:  As directed    Discharge instructions   Complete by:  As directed     1)Please take prescribed  medication as instructed. 2)Please follow-up with your PCP in a week. 3)You will be called by oncology and pulmonology for the follow-up as an outpatient.   Increase activity slowly   Complete by:  As directed      Allergies as of 09/06/2018      Reactions   Codeine Other (See Comments)   GI upset   Cyclosporine Other (See Comments)   Burning eyes   Other    Other reaction(s): OTHER   Pregabalin Other (See Comments)   Personality changes-becomes angry   Zolpidem    Other reaction(s): ITCHING      Medication List    TAKE these medications   acetaminophen 500 MG tablet Commonly known as:  TYLENOL Take 1,000 mg by mouth every 6 (six) hours as needed for mild pain.   ALPRAZolam 0.5 MG tablet Commonly known as:  XANAX TAKE 1 TABLET BY MOUTH TWICE A DAY AS NEEDED What changed:    how much to take  when to take this   amLODipine 10 MG tablet Commonly known as:  NORVASC Take 1 tablet (10 mg total) by mouth daily. Start taking on:  September 07, 2018   amoxicillin-clavulanate 875-125 MG tablet Commonly known as:  AUGMENTIN Take 1 tablet by mouth every 12 (twelve) hours.   aspirin 81 MG tablet Take 81 mg by mouth daily.   atorvastatin 40 MG tablet Commonly known as:  LIPITOR TAKE 1 TABLET BY MOUTH ONCE DAILY What changed:  when to take this   betamethasone dipropionate 0.05 % cream Commonly known as:  DIPROLENE Apply 9.56 application topically 2 (two) times daily as needed (skin care).   BIOTIN PO Take 5,000 mcg by mouth daily.   CALCIUM 600+D 600-200 MG-UNIT Tabs Generic drug:  Calcium Carbonate-Vitamin D Take 1 tablet by mouth 2 (two) times daily.   cetirizine 10 MG tablet Commonly known as:  ZYRTEC Take 10 mg by mouth daily.   clobetasol 0.05 % external solution Commonly known as:  TEMOVATE Apply 1 application topically daily as needed (skin care).   CoQ10 100 MG Caps Take 100 mg by mouth daily.   docusate sodium 100 MG  capsule Commonly known as:  COLACE Take 100 mg by mouth 2 (two) times daily.   folic acid 213 MCG tablet Commonly known as:  FOLVITE Take 1,000 mcg by mouth daily.   gabapentin 100 MG capsule Commonly known as:  NEURONTIN Take 100 mg by mouth 3 (three) times daily.   guaiFENesin 600 MG 12 hr tablet Commonly known as:  MUCINEX Take 1 tablet (600 mg total) by mouth 2 (two) times daily.   hydrochlorothiazide 50 MG tablet Commonly known as:  HYDRODIURIL TAKE 1 TABLET BY MOUTH ONCE A DAY   hydroxychloroquine 200 MG tablet Commonly known as:  PLAQUENIL Take 200 mg by mouth daily.   KRILL OIL PO Take 500 mg by mouth daily.   methotrexate 2.5 MG tablet Commonly known as:  RHEUMATREX Take 20 mg by mouth every Monday.   MULTIVITAMIN ADULT PO Take 1 tablet by mouth daily.   Omega 3 1000 MG Caps Take 3 capsules by mouth daily.   OPTIVE 0.5-0.9 % ophthalmic solution Generic drug:  carboxymethylcellul-glycerin Place 1 drop into both eyes 3 (three) times daily as needed for dry eyes.   Potassium 99 MG Tabs Take 1 tablet by mouth daily.   predniSONE 10 MG tablet Commonly known as:  DELTASONE Take 4 pills daily for 3 days then 3 pills daily for 3  days then 2 pills daily for 3 days then 1 pill daily for 3 days then stop   PROAIR HFA 108 (90 Base) MCG/ACT inhaler Generic drug:  albuterol INHALE 2 PUFFS EVERY 6 HOURS AS NEEDED What changed:  reasons to take this   albuterol (2.5 MG/3ML) 0.083% nebulizer solution Commonly known as:  PROVENTIL USE 1 VIAL IN NEBULIZER AS DIRECTED EVERY 4 HOURS AS NEEDED What changed:  See the new instructions.   ranitidine 150 MG tablet Commonly known as:  ZANTAC Take 150 mg by mouth daily as needed for heartburn.   REFRESH TEARS 0.5 % Soln Generic drug:  carboxymethylcellulose Place 1 drop into both eyes 3 (three) times daily as needed (dry eyes).   Vitamin D (Cholecalciferol) 25 MCG (1000 UT) Tabs Take 1 tablet by mouth at bedtime.             Durable Medical Equipment  (From admission, onward)         Start     Ordered   09/05/18 1325  For home use only DME oxygen  Once    Question Answer Comment  Mode or (Route) Nasal cannula   Liters per Minute 3   Frequency Continuous (stationary and portable oxygen unit needed)   Oxygen delivery system Gas      09/05/18 1324         Follow-up Information    [facility]-Home Follow up.   Specialty:  Home Health Services Why:  For home health PT and RN. They will also deliver home oxygen to your room prior to DC.  Contact information: [place] [address] [phone]        [name], [name], [place]. Schedule an appointment as soon as possible for a visit in 1 week(s).   Specialty:  Family Medicine Contact information: [place] [address] [phone]          Allergies  Allergen Reactions  . Codeine Other (See Comments)    GI upset  . Cyclosporine Other (See Comments)    Burning eyes  . Other     Other reaction(s): OTHER  . Pregabalin Other (See Comments)    Personality changes-becomes angry  . Zolpidem     Other reaction(s): ITCHING    Consultations:  Oncology, pulmonology, IR   Procedures/Studies: Ct Angio Chest Pe W/cm &/or Wo Cm  Result Date: 09/01/2018 CLINICAL DATA:  Shortness of breath. EXAM: CT ANGIOGRAPHY CHEST WITH CONTRAST TECHNIQUE: Multidetector CT imaging of the chest was performed using the standard protocol during bolus administration of intravenous contrast. Multiplanar CT image reconstructions and MIPs were obtained to evaluate the vascular anatomy. CONTRAST:  52 mL ISOVUE-370 IOPAMIDOL (ISOVUE-370) INJECTION 76% COMPARISON:  Chest radiograph 09/01/2018 FINDINGS: Cardiovascular: There are no filling defects within the pulmonary arteries to suggest pulmonary embolus. Diffuse atherosclerosis of the thoracic aorta without aneurysm or dissection. Conventional branching pattern  from the aortic arch. There are coronary artery calcifications. Heart is normal in size. Small pericardial effusion measures up to 10 mm in depth. Mediastinum/Nodes: Prominent left hilar lymph nodes largest measuring up to 13 mm, image 65 series 5. Prominent subcarinal node measures 12 mm short axis. Small high mediastinal nodes are not enlarged by size criteria. No enlarged right hilar lymph nodes. No axillary adenopathy. Esophagus is decompressed. No visualized thyroid nodule. Lungs/Pleura: Moderate size left pleural effusion is partially loculated. Associated compressive atelectasis in the left lower and to a lesser extent upper lobes. Suspect spiculated 19  x 16 mm anterior left upper lobe nodule image 68 series 6, abutting the pleural surface. Fissural nodularity extends throughout the left interlobar fissure with possible tracking pleural fluid. Dominant fissural nodule measures 9 mm image 35 series 6. Moderate emphysema. There is narrowing of the left upper and lower lobe bronchi with mucous plugging in the lingula. Additional areas of mucous plugging in the right lower lobe. Right middle lobe pleural based nodule measures 4 mm, image 85 series 6. 5 mm right apical nodule, image 19. Additional tiny subpleural nodules in the right lung. Upper Abdomen: Motion artifact through the abdomen. Left adrenal thickening without dominant nodule. Atherosclerotic plaque at the diaphragmatic hiatus causes luminal narrowing of the aorta of greater than 50%. Musculoskeletal: No blastic or destructive lytic lesions. Review of the MIP images confirms the above findings. IMPRESSION: 1. Suspect spiculated 19 x 16 mm anterior left upper lobe nodule, suspicious for primary bronchogenic malignancy. Fissural nodularity extends throughout the left interlobar fissure. Enlarged left hilar lymph node and prominent subcarinal node. Consider multi disciplinary pulmonary referral for further workup. 2. Moderate size partially loculated left  pleural effusion with compressive atelectasis in the left lower and to a lesser extent upper lobes. 3. Small nodules in the right lung, including a 5 mm right apical nodule that is slightly irregular, nonspecific. 4. No pulmonary embolus. 5. Moderate emphysema. Areas of mucous plugging involves the left upper lobe and right lower lobes. 6. Advanced aortic atherosclerosis. Calcified plaque at the diaphragmatic hiatus causes luminal narrowing of greater than 50%. Aortic Atherosclerosis (ICD10-I70.0) and Emphysema (ICD10-J43.9). Electronically Signed   By: Keith Rake M.D.   On: 09/01/2018 22:40   Dg Chest Port 1 View  Result Date: 09/05/2018 CLINICAL DATA:  78 year old female status post left chest tube removal EXAM: PORTABLE CHEST 1 VIEW COMPARISON:  Multiple prior most recent chest x-ray 09/03/2018, CT 09/02/2018 FINDINGS: Cardiomediastinal silhouette unchanged. Chronic interstitial opacities of the lungs, similar to the prior. Unchanged appearance of ill-defined mixed interstitial and airspace opacity at the left lung base. Interval removal of pigtail drainage catheter from the left pleural space with no visualized pneumothorax. Blunting at the left costophrenic angle persists. Aeration on the right is maintained. IMPRESSION: Interval removal of left thoracostomy tube with no visualized pneumothorax. Similar appearance of mixed interstitial and airspace opacities at the left lung base with likely residual small volume pleural thickening/fluid. Electronically Signed   By: Corrie Mckusick D.O.   On: 09/05/2018 15:15   Dg Chest Port 1 View  Result Date: 09/03/2018 CLINICAL DATA:  Pleural effusion EXAM: PORTABLE CHEST 1 VIEW COMPARISON:  CT chest 09/01/2018 FINDINGS: The lungs are hyperinflated likely secondary to COPD. There is a small left pleural effusion. There is a left pleural drainage catheter present. There is chronic bilateral interstitial thickening. There is no pneumothorax. The heart mediastinum  are stable. The osseous structures are unremarkable. IMPRESSION: 1. Small left pleural effusion with a left pleural drainage catheter present. No pneumothorax. Electronically Signed   By: Kathreen Devoid   On: 09/03/2018 16:04   Dg Chest Port 1 View  Result Date: 09/01/2018 CLINICAL DATA:  Short of breath. EXAM: PORTABLE CHEST 1 VIEW COMPARISON:  None. FINDINGS: Cardiac silhouette is partly obscured by left lung base opacity. Is grossly normal in size. No mediastinal or hilar masses. Opacity at the left lung base is consistent with a moderate pleural effusion with associated atelectasis or pneumonia. Lungs show prominent bronchovascular markings and mild lower lung zone interstitial thickening, presumed chronic. Mild  scarring noted at the apices. No convincing right pleural effusion.  No pneumothorax. Skeletal structures are grossly intact. IMPRESSION: 1. Moderate left pleural effusion associated with left lung base atelectasis or pneumonia. 2. No other evidence of acute cardiopulmonary disease. Electronically Signed   By: Lajean Manes M.D.   On: 09/01/2018 18:49   Ct Image Guided Drainage By Percutaneous Catheter  Result Date: 09/02/2018 CLINICAL DATA:  Loculated left pleural effusion and left upper lobe pulmonary nodule. There has been inability to obtain pleural fluid with attempted bedside thoracentesis. Request has been made to place a pleural drainage catheter. EXAM: CT GUIDED LEFT PLEURAL DRAINAGE CATHETER PLACEMENT ANESTHESIA/SEDATION: 4.0 mg IV Versed 100 mcg IV Fentanyl Total Moderate Sedation Time:  24 minutes The patient's level of consciousness and physiologic status were continuously monitored during the procedure by Radiology nursing. PROCEDURE: The procedure, risks, benefits, and alternatives were explained to the patient. Questions regarding the procedure were encouraged and answered. The patient understands and consents to the procedure. A time out was performed prior to initiating the  procedure. CT was performed in a supine position with the left side rolled up. The left lateral chest wall was prepped with chlorhexidine in a sterile fashion, and a sterile drape was applied covering the operative field. A sterile gown and sterile gloves were used for the procedure. Local anesthesia was provided with 1% Lidocaine. Under CT guidance, an 18 gauge trocar needle was advanced into the left pleural space. Fluid was aspirated. A guidewire was advanced in the needle removed. The percutaneous tract was dilated over the guidewire and a 12 French pigtail drainage catheter placed over the wire. Catheter position was confirmed by CT. Additional fluid was aspirated and sent for requested laboratory testing. The catheter was then connected to a [place] Pleur-evac and wall suction at -20 cm water. COMPLICATIONS: None FINDINGS: Initial aspiration yielded grossly bloody left pleural fluid. After placement of the pigtail pleural drain, there is good return of bloody fluid. A sample of 120 mL of fluid was sent for requested laboratory testing. IMPRESSION: CT-guided placement of left pleural drainage catheter for drainage of a loculated left pleural effusion. There was return of grossly bloody pleural fluid. A 12 French catheter was placed and attached to a Pleur-evac device and wall suction at -20 cm of water. A sample of fluid was sent for requested laboratory testing. Electronically Signed   By: Aletta Edouard M.D.   On: 09/02/2018 12:50   Vas [place] Lower Extremity Venous (dvt)  Result Date: 09/04/2018  Lower Venous Study Indications: Edema.  Performing Technologist: Oliver Hum RVT  Examination Guidelines: A complete evaluation includes B-mode imaging, spectral Doppler, color Doppler, and power Doppler as needed of all accessible portions of each vessel. Bilateral testing is considered an integral part of a complete examination. Limited examinations for reoccurring indications may be performed as noted.  Right  Venous Findings: +---------+---------------+---------+-----------+----------+-------+          CompressibilityPhasicitySpontaneityPropertiesSummary +---------+---------------+---------+-----------+----------+-------+ CFV      Full           Yes      Yes                          +---------+---------------+---------+-----------+----------+-------+ SFJ      Full                                                 +---------+---------------+---------+-----------+----------+-------+  FV Prox  Full                                                 +---------+---------------+---------+-----------+----------+-------+ FV Mid   Full                                                 +---------+---------------+---------+-----------+----------+-------+ FV DistalFull                                                 +---------+---------------+---------+-----------+----------+-------+ PFV      Full                                                 +---------+---------------+---------+-----------+----------+-------+ POP      Full           Yes      Yes                          +---------+---------------+---------+-----------+----------+-------+ PTV      Full                                                 +---------+---------------+---------+-----------+----------+-------+ PERO     Full                                                 +---------+---------------+---------+-----------+----------+-------+  Left Venous Findings: +---------+---------------+---------+-----------+----------+-------+          CompressibilityPhasicitySpontaneityPropertiesSummary +---------+---------------+---------+-----------+----------+-------+ CFV      Full           Yes      Yes                          +---------+---------------+---------+-----------+----------+-------+ SFJ      Full                                                  +---------+---------------+---------+-----------+----------+-------+ FV Prox  Full                                                 +---------+---------------+---------+-----------+----------+-------+ FV Mid   Full                                                 +---------+---------------+---------+-----------+----------+-------+  FV DistalFull                                                 +---------+---------------+---------+-----------+----------+-------+ PFV      Full                                                 +---------+---------------+---------+-----------+----------+-------+ POP      Full           Yes      Yes                          +---------+---------------+---------+-----------+----------+-------+ PTV      Full                                                 +---------+---------------+---------+-----------+----------+-------+ PERO     Full                                                 +---------+---------------+---------+-----------+----------+-------+    Summary: Right: There is no evidence of deep vein thrombosis in the lower extremity. No cystic structure found in the popliteal fossa. Left: There is no evidence of deep vein thrombosis in the lower extremity. No cystic structure found in the popliteal fossa.  *See table(s) above for measurements and observations. Electronically signed by Sherren Mocha MD on 09/04/2018 at 8:09:27 PM.    Final        Subjective: Patient seen and examined at bedside this morning.  Still remains mildly short of breath.  She desperately wants to go home.  After extensive discussion with the family, patient had pulmonology, we are planning to discharge her home.  Pulmonology and oncology will follow her as an outpatient.  Discharge Exam: Vitals:   09/06/18 1029 09/06/18 1311  BP: (!) 167/97   Pulse:    Resp:    Temp:    SpO2:  99%   Vitals:   09/06/18 0732 09/06/18 0750 09/06/18 1029 09/06/18 1311   BP:  (!) 160/69 (!) 167/97   Pulse:  (!) 115    Resp:  16    Temp:  97.7 F (36.5 C)    TempSrc:  Oral    SpO2: 98% 96%  99%  Weight:      Height:        General: Pt is alert, awake Cardiovascular: RRR, S1/S2 +, no rubs, no gallops Respiratory: Bilateral decreased air entry Abdominal: Soft, NT, ND, bowel sounds + Extremities: no edema, no cyanosis    The results of significant diagnostics from this hospitalization (including imaging, microbiology, ancillary and laboratory) are listed below for reference.     Microbiology: Recent Results (from the past 240 hour(s))  Blood culture (routine x 2)     Status: None   Collection Time: 09/01/18  8:35 PM  Result Value Ref Range Status   Specimen Description BLOOD RIGHT ANTECUBITAL  Final   Special Requests  Final    BOTTLES DRAWN AEROBIC AND ANAEROBIC Blood Culture adequate volume   Culture   Final    NO GROWTH 5 DAYS Performed at [facility], [place] [address] [place], [place] [address]    Report Status 09/06/2018 FINAL  Final  Blood culture (routine x 2)     Status: None   Collection Time: 09/01/18  9:27 PM  Result Value Ref Range Status   Specimen Description BLOOD RIGHT HAND  Final   Special Requests   Final    BOTTLES DRAWN AEROBIC AND ANAEROBIC Blood Culture adequate volume   Culture   Final    NO GROWTH 5 DAYS Performed at [facility], [place] [address] [place], [place] [address]    Report Status 09/06/2018 FINAL  Final  Respiratory Panel by PCR     Status: None   Collection Time: 09/02/18  1:19 AM  Result Value Ref Range Status   Adenovirus NOT DETECTED NOT DETECTED Final   Coronavirus 229E NOT DETECTED NOT DETECTED Final   Coronavirus HKU1 NOT DETECTED NOT DETECTED Final   Coronavirus NL63 NOT DETECTED NOT DETECTED Final   Coronavirus OC43 NOT DETECTED NOT DETECTED Final   Metapneumovirus NOT DETECTED NOT DETECTED Final   Rhinovirus / Enterovirus NOT DETECTED NOT DETECTED Final   Influenza A NOT  DETECTED NOT DETECTED Final   Influenza B NOT DETECTED NOT DETECTED Final   Parainfluenza Virus 1 NOT DETECTED NOT DETECTED Final   Parainfluenza Virus 2 NOT DETECTED NOT DETECTED Final   Parainfluenza Virus 3 NOT DETECTED NOT DETECTED Final   Parainfluenza Virus 4 NOT DETECTED NOT DETECTED Final   Respiratory Syncytial Virus NOT DETECTED NOT DETECTED Final   Bordetella pertussis NOT DETECTED NOT DETECTED Final   Chlamydophila pneumoniae NOT DETECTED NOT DETECTED Final   Mycoplasma pneumoniae NOT DETECTED NOT DETECTED Final  MRSA PCR Screening     Status: None   Collection Time: 09/02/18  1:19 AM  Result Value Ref Range Status   MRSA by PCR NEGATIVE NEGATIVE Final    Comment:        The GeneXpert MRSA Assay (FDA approved for NASAL specimens only), is one component of a comprehensive MRSA colonization surveillance program. It is not intended to diagnose MRSA infection nor to guide or monitor treatment for MRSA infections. Performed at [facility], [place] [address] [place], [place] [address]   Fungus Culture With Stain     Status: None (Preliminary result)   Collection Time: 09/02/18  8:06 AM  Result Value Ref Range Status   Fungus Stain Final report  Final    Comment: (NOTE) Performed At: [facility] [place], [place] [id number] Rush Farmer MD [id number]    Fungus (Mycology) Culture PENDING  Incomplete   Fungal Source FLUID  Final  Fungus Culture Result     Status: None   Collection Time: 09/02/18  8:06 AM  Result Value Ref Range Status   Result 1 Comment  Final    Comment: (NOTE) KOH/Calcofluor preparation:  no fungus observed. Performed At: [facility] [place], [place] [id number] Rush Farmer MD [id number]   Gram stain     Status: None   Collection Time: 09/02/18 12:37 PM  Result Value Ref Range Status   Specimen Description FLUID PLEURAL  Final   Special Requests NONE  Final   Gram Stain   Final     ABUNDANT WBC PRESENT, PREDOMINANTLY MONONUCLEAR NO ORGANISMS SEEN Performed  at [facility], [place] [address], [place], [place] [address]    Report Status 09/02/2018 FINAL  Final  Culture, body fluid-bottle     Status: None (Preliminary result)   Collection Time: 09/02/18 12:37 PM  Result Value Ref Range Status   Specimen Description FLUID PLEURAL  Final   Special Requests NONE  Final   Culture   Final    NO GROWTH 4 DAYS Performed at [place] [address], [place], [place] [address]    Report Status PENDING  Incomplete  Culture, sputum-assessment     Status: None   Collection Time: 09/03/18  1:01 PM  Result Value Ref Range Status   Specimen Description SPUTUM  Final   Special Requests Immunocompromised  Final   Sputum evaluation   Final    Sputum specimen not acceptable for testing.  Please recollect.   RESULT CALLED TO, READ BACK BY AND VERIFIED WITH: T PAPAKY RN 09/03/18 1957 JDW Performed at [facility], [place] [address], [place], [place] [address]    Report Status 09/03/2018 FINAL  Final     Labs: BNP (last 3 results) Recent Labs    09/01/18 1818  BNP 831.5*   Basic Metabolic Panel: Recent Labs  Lab 09/01/18 1816 09/02/18 0315 09/03/18 0345 09/05/18 0400  NA 130* 131* 132* 135  K 3.5 3.5 4.0 5.1  CL 94* 95* 99 100  CO2 24 25 23 26   GLUCOSE 158* 136* 128* 132*  BUN 14 16 20  26*  CREATININE 0.75 0.76 0.67 0.71  CALCIUM 9.4 8.9 8.3* 8.5*  MG  --  2.1  --   --   PHOS  --  3.8  --   --    Liver Function Tests: Recent Labs  Lab 09/02/18 0315 09/02/18 1013  AST 17  --   ALT 18  --   ALKPHOS 43  --   BILITOT 1.2  --   PROT 6.8 7.2  ALBUMIN 2.9* 3.0*   No results for input(s): LIPASE, AMYLASE in the last 168 hours. No results for input(s): AMMONIA in the last 168 hours. CBC: Recent Labs  Lab 09/01/18 1816 09/02/18 0315 09/05/18 0400  WBC 8.0 10.0 11.1*  NEUTROABS 6.9  --  10.0*  HGB 13.8 12.5 13.0  HCT 39.9 35.4* 38.8  MCV  91.7 89.6 92.8  PLT 282 268 326   Cardiac Enzymes: Recent Labs  Lab 09/01/18 2019 09/02/18 0315 09/02/18 1013  TROPONINI <0.03 <0.03 <0.03   BNP: Invalid input(s): POCBNP CBG: No results for input(s): GLUCAP in the last 168 hours. D-Dimer No results for input(s): DDIMER in the last 72 hours. Hgb A1c No results for input(s): HGBA1C in the last 72 hours. Lipid Profile No results for input(s): CHOL, HDL, LDLCALC, TRIG, CHOLHDL, LDLDIRECT in the last 72 hours. Thyroid function studies No results for input(s): TSH, T4TOTAL, T3FREE, THYROIDAB in the last 72 hours.  Invalid input(s): FREET3 Anemia work up No results for input(s): VITAMINB12, FOLATE, FERRITIN, TIBC, IRON, RETICCTPCT in the last 72 hours. Urinalysis    Component Value Date/Time   COLORURINE YELLOW 09/02/2018 0642   APPEARANCEUR CLEAR 09/02/2018 0642   APPEARANCEUR Cloudy 02/17/2012 1142   LABSPEC >1.046 (H) 09/02/2018 0642   LABSPEC 1.028 02/17/2012 1142   PHURINE 6.0 09/02/2018 0642   GLUCOSEU NEGATIVE 09/02/2018 0642   GLUCOSEU Negative 02/17/2012 1142   HGBUR NEGATIVE 09/02/2018 0642   BILIRUBINUR NEGATIVE 09/02/2018 0642   BILIRUBINUR Negative 02/17/2012 1142  KETONESUR 5 (A) 09/02/2018 0642   PROTEINUR 30 (A) 09/02/2018 0642   NITRITE NEGATIVE 09/02/2018 0642   LEUKOCYTESUR NEGATIVE 09/02/2018 0642   LEUKOCYTESUR Negative 02/17/2012 1142   Sepsis Labs Invalid input(s): PROCALCITONIN,  WBC,  LACTICIDVEN Microbiology Recent Results (from the past 240 hour(s))  Blood culture (routine x 2)     Status: None   Collection Time: 09/01/18  8:35 PM  Result Value Ref Range Status   Specimen Description BLOOD RIGHT ANTECUBITAL  Final   Special Requests   Final    BOTTLES DRAWN AEROBIC AND ANAEROBIC Blood Culture adequate volume   Culture   Final    NO GROWTH 5 DAYS Performed at [facility], [place] [address], [place], [place] [address]    Report Status 09/06/2018 FINAL  Final  Blood culture (routine  x 2)     Status: None   Collection Time: 09/01/18  9:27 PM  Result Value Ref Range Status   Specimen Description BLOOD RIGHT HAND  Final   Special Requests   Final    BOTTLES DRAWN AEROBIC AND ANAEROBIC Blood Culture adequate volume   Culture   Final    NO GROWTH 5 DAYS Performed at [facility], [place] [address] [place], [place] [address]    Report Status 09/06/2018 FINAL  Final  Respiratory Panel by PCR     Status: None   Collection Time: 09/02/18  1:19 AM  Result Value Ref Range Status   Adenovirus NOT DETECTED NOT DETECTED Final   Coronavirus 229E NOT DETECTED NOT DETECTED Final   Coronavirus HKU1 NOT DETECTED NOT DETECTED Final   Coronavirus NL63 NOT DETECTED NOT DETECTED Final   Coronavirus OC43 NOT DETECTED NOT DETECTED Final   Metapneumovirus NOT DETECTED NOT DETECTED Final   Rhinovirus / Enterovirus NOT DETECTED NOT DETECTED Final   Influenza A NOT DETECTED NOT DETECTED Final   Influenza B NOT DETECTED NOT DETECTED Final   Parainfluenza Virus 1 NOT DETECTED NOT DETECTED Final   Parainfluenza Virus 2 NOT DETECTED NOT DETECTED Final   Parainfluenza Virus 3 NOT DETECTED NOT DETECTED Final   Parainfluenza Virus 4 NOT DETECTED NOT DETECTED Final   Respiratory Syncytial Virus NOT DETECTED NOT DETECTED Final   Bordetella pertussis NOT DETECTED NOT DETECTED Final   Chlamydophila pneumoniae NOT DETECTED NOT DETECTED Final   Mycoplasma pneumoniae NOT DETECTED NOT DETECTED Final  MRSA PCR Screening     Status: None   Collection Time: 09/02/18  1:19 AM  Result Value Ref Range Status   MRSA by PCR NEGATIVE NEGATIVE Final    Comment:        The GeneXpert MRSA Assay (FDA approved for NASAL specimens only), is one component of a comprehensive MRSA colonization surveillance program. It is not intended to diagnose MRSA infection nor to guide or monitor treatment for MRSA infections. Performed at [facility], [place] [address], [place], [place] [address]   Fungus Culture  With Stain     Status: None (Preliminary result)   Collection Time: 09/02/18  8:06 AM  Result Value Ref Range Status   Fungus Stain Final report  Final    Comment: (NOTE) Performed At: [facility] [place], [place] [id number] Rush Farmer MD [id number]    Fungus (Mycology) Culture PENDING  Incomplete   Fungal Source FLUID  Final  Fungus Culture Result     Status: None   Collection Time: 09/02/18  8:06 AM  Result Value Ref Range Status  Result 1 Comment  Final    Comment: (NOTE) KOH/Calcofluor preparation:  no fungus observed. Performed At: [facility] [place], [place] [id number] Rush Farmer MD [id number]   Gram stain     Status: None   Collection Time: 09/02/18 12:37 PM  Result Value Ref Range Status   Specimen Description FLUID PLEURAL  Final   Special Requests NONE  Final   Gram Stain   Final    ABUNDANT WBC PRESENT, PREDOMINANTLY MONONUCLEAR NO ORGANISMS SEEN Performed at [facility], [address] [place], [place] [address]    Report Status 09/02/2018 FINAL  Final  Culture, body fluid-bottle     Status: None (Preliminary result)   Collection Time: 09/02/18 12:37 PM  Result Value Ref Range Status   Specimen Description FLUID PLEURAL  Final   Special Requests NONE  Final   Culture   Final    NO GROWTH 4 DAYS Performed at [place] [address], [place], [place] [address]    Report Status PENDING  Incomplete  Culture, sputum-assessment     Status: None   Collection Time: 09/03/18  1:01 PM  Result Value Ref Range Status   Specimen Description SPUTUM  Final   Special Requests Immunocompromised  Final   Sputum evaluation   Final    Sputum specimen not acceptable for testing.  Please recollect.   RESULT CALLED TO, READ BACK BY AND VERIFIED WITH: T PAPAKY RN 09/03/18 1957 JDW Performed at [facility], [place] [address], [place], [place] [address]    Report Status 09/03/2018 FINAL  Final     Please note: You were cared for by a hospitalist during your hospital stay. Once you are discharged, your primary care physician will handle any further medical issues. Please note that NO REFILLS for any discharge medications will be authorized once you are discharged, as it is imperative that you return to your primary care physician (or establish a relationship with a primary care physician if you do not have one) for your post hospital discharge needs so that they can reassess your need for medications and monitor your lab values.    Time coordinating discharge: 40 minutes  SIGNED:   Shelly Coss, MD  [facility] 09/06/2018, 2:30 PM Pager [phone]  If 7PM-7AM, please contact night-coverage [email or web address] Password [email or web address]

## 2018-09-06 NOTE — Progress Notes (Signed)
NAME:  Donna Mcneil, MRN:  [id number], DOB:  March 22, 1941, LOS: 5 ADMISSION DATE:  09/01/2018, CONSULTATION DATE:  09/02/2017 REFERRING MD:  Roel Cluck, CHIEF COMPLAINT:  Dyspnea   Brief History   78 y/o female with COPD and RA on methotrexate admitted on 1/4 with dyspnea, cough mucus production.    History of present illness   This is a 78 year old female with a past medical history significant for COPD, rheumatoid arthritis on methotrexate and lupus who presented to [facility] on September 01, 2018 in the setting of cough, shortness of breath, and failure of symptoms to resolve after being treated with azithromycin and prednisone.  She has smoked cigarettes for many years and continues to smoke.  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  She has been treated with methotrexate, Enbrel in the past (not taking currently), and Plaquenil.  She has reported increasing shortness of breath ever since November 2019 and has been seen by her physician for similar symptoms.  She was most recently treated with azithromycin and prednisone but had no improvement in symptoms so she came to the emergency department.  In the emergency department she was noted to have a pleural effusion on the left which was loculated.  She tells me that she has had increasing shortness of breath ever since November.  She has coughed up clear mucus for years but in the last several weeks it has been yellow to green in color.  She denies fevers or chills.  She has lost quite a bit of weight over the last several months.  She thinks may be 30 pounds or so.  Sh and she says this is been steadily worsening ever since Enbrel was held over a year ago in the setting of her lupus.  09/04/2018 no acute distress, no air leak or chest tube drainage. 09/06/2018 chest tube has been removed.  No acute distress at rest.  Remains on oxygen may be O2 dependent.    Procedures:  09/02/2018  attempted thoracentesis 09/02/2018 left chest tube placed for CT-guided and interventional radiology  Significant Diagnostic Tests:  September 01, 2018 CT chest images independently reviewed: There is centrilobular emphysema bilaterally, mild to moderate in severity, the right lung is essentially clear but in the left there is a moderate sized partially loculated pleural effusion compressing the left lower lobe and partially the left upper lobe, there is a nodule adjacent to the pleural fluid in the left upper lobe of undetermined significance, there is also mediastinal and hilar adenopathy most significant on the left  Micro Data:  1/5 Pleural fluid bacterial culture> 1/5 pleural fluid fungus culture>  1/5 pleural fluid afb culture> 09/03/2017 sputum>>  Antimicrobials:  09/05/2018 Augmentin>>  Interim history/subjective:  Reports feeling much better today on 09/04/2018  Objective   Blood pressure (!) 160/69, pulse (!) 115, temperature 97.7 F (36.5 C), temperature source Oral, resp. rate 16, height 5\' 2"  (1.575 m), weight 66 kg, SpO2 96 %.        Intake/Output Summary (Last 24 hours) at 09/06/2018 0900 Last data filed at 09/06/2018 0000 Gross per 24 hour  Intake 220 ml  Output 300 ml  Net -80 ml   Filed Weights   09/01/18 1754 09/02/18 0534  Weight: 65.8 kg 66 kg    Examination:  General: Frail elderly female who has tremulous movements HEENT: No JVD or lymphadenopathy is appreciated Neuro: Awake and alert appears neurologically intact CV: Sounds are distant  PULM: even/non-labored, lungs bilaterally diminished in the bases, no overt wheezing noted TW:SFKC, non-tender, bsx4 active  Extremities: warm/dry, negative edema  Skin: no rashes or lesions     Resolved Hospital Problem list   Left chest tube removed on 09/05/2018  Assessment & Plan:  Loculated pleural effusion with nodularity in the left lung and mediastinal adenopathy: The differential diagnosis is broad and I think the  likelihood of bronchogenic malignancy, rheumatoid arthritis associated nodules and effusion, or infection are all equal considering her clinical context 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 09/04/2018 less than 50 cc drainage over 24 hours.  09/05/2018 left chest tube removed. 09/06/2018 pulmonary critical care reconsulted for questionable tissue biopsy of left lung mass. 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  Note pathology reports 5 cc of fluid but interventional radiology's notes is 120 cc cc of fluid sent. Plan: Status post left thoracentesis on 09/02/2018 per Dr. Lake Bells Status post left interventional radiology placed chest tube on left with reported 120 cc of fluid sent to cytology. 09/05/2018 left chest tube was removed per interventional radiology 09/06/2018 questionable CT-guided fine-needle aspiration of left lung mass per interventional radiology.  Dr. Lake Bells to discuss benefits and adverse reactions with patient on 09/06/2018.  COPD with acute exacerbation: Currently O2 dependent  Plan: Currently on Augmentin Currently on Solu-Medrol 40 mg every 12 hours, considering weaning as she is not bronchospastic at this time. Continue bronchodilators Continue anxiolytics Home O2 she meets criteria for sats 88% or less on room air   Cigarette smoker: Plan Counseled to cease   Global: 78 year old female who has suffered from anxiety, chronic obstructive pulmonary disease, and is status post thoracentesis chest tube placement on 09/02/2018 with findings suspicious for adenocarcinoma.  09/06/2018 pulmonary critical care asked to reevaluate for questionable tissue biopsy.  Prolonged discussion with patient concerning pros and cons of interventional process.  She is hesitant to have further interventions at this time but is receptive to further conversations concerning possible  biopsy.     Best practice:   Per primary team  Labs   CBC: Recent Labs  Lab 09/01/18 1816 09/02/18 0315 09/05/18 0400  WBC 8.0 10.0 11.1*  NEUTROABS 6.9  --  10.0*  HGB 13.8 12.5 13.0  HCT 39.9 35.4* 38.8  MCV 91.7 89.6 92.8  PLT 282 268 127    Basic Metabolic Panel: Recent Labs  Lab 09/01/18 1816 09/02/18 0315 09/03/18 0345 09/05/18 0400  NA 130* 131* 132* 135  K 3.5 3.5 4.0 5.1  CL 94* 95* 99 100  CO2 24 25 23 26   GLUCOSE 158* 136* 128* 132*  BUN 14 16 20  26*  CREATININE 0.75 0.76 0.67 0.71  CALCIUM 9.4 8.9 8.3* 8.5*  MG  --  2.1  --   --   PHOS  --  3.8  --   --    GFR: Estimated Creatinine Clearance: 51.7 mL/min (by C-G formula based on SCr of 0.71 mg/dL). Recent Labs  Lab 09/01/18 1816 09/02/18 0031 09/02/18 0315 09/05/18 0400  PROCALCITON  --  <0.10  --   --   WBC 8.0  --  10.0 11.1*  LATICACIDVEN  --  2.9* 1.1  --     Liver Function Tests: Recent Labs  Lab 09/02/18 0315 09/02/18 1013  AST 17  --   ALT 18  --   ALKPHOS 43  --   BILITOT 1.2  --  PROT 6.8 7.2  ALBUMIN 2.9* 3.0*   No results for input(s): LIPASE, AMYLASE in the last 168 hours. No results for input(s): AMMONIA in the last 168 hours.  ABG    Component Value Date/Time   HCO3 26.0 09/01/2018 2046   TCO2 27 09/01/2018 2046   O2SAT 89.0 09/01/2018 2046     Coagulation Profile: Recent Labs  Lab 09/02/18 0031  INR 0.98    Cardiac Enzymes: Recent Labs  Lab 09/01/18 2019 09/02/18 0315 09/02/18 1013  TROPONINI <0.03 <0.03 <0.03    HbA1C: No results found for: HGBA1C  CBG: No results for input(s): GLUCAP in the last 168 hours.   Richardson Landry Olivianna Higley ACNP Maryanna Shape PCCM Pager [phone] till 1 pm If no answer page [phone] 09/06/2018, 9:00 AM

## 2018-09-06 NOTE — Progress Notes (Signed)
Physical Therapy Treatment Patient Details Name: Donna Mcneil MRN: [id number] DOB: 1941-05-07 Today's Date: 09/06/2018    History of Present Illness Pt is a 78 y.o. F with significant PMH of COPD, RA on methotrexate, lupus who presents with dyspnea and cough mucus production. Unable to drain fluid with thoracentesis attempt. CT shows moderate volume, loculated left pleural effusion. Plan to proceed with CT guided pigtail pleural drainage catheter placement to drain fluid.     PT Comments    Patient is progressing very well towards their physical therapy goals. Increased ambulation distance to 90 feet x 2 using a Rollator and supervision. SpO2 maintaining 95% on 3L O2, HR 121-132 bpm. Educated pt on endurance conservation strategies and activity progression. Pt polite but appears to have decreased receptiveness to education as she states, "once I get back to my normal routine, I'll be fine." Will continue to progress as tolerated.    Follow Up Recommendations  Home health PT;Supervision - Intermittent     Equipment Recommendations  None recommended by PT    Recommendations for Other Services       Precautions / Restrictions Precautions Precautions: Fall Precaution Comments: watch O2, HR Restrictions Weight Bearing Restrictions: No    Mobility  Bed Mobility Overal bed mobility: Modified Independent                Transfers Overall transfer level: Needs assistance Equipment used: Rolling walker (2 wheeled);1 person hand held assist Transfers: Sit to/from Stand Sit to Stand: Supervision            Ambulation/Gait Ambulation/Gait assistance: Supervision Gait Distance (Feet): 90 Feet(x2) Assistive device: Rolling walker (2 wheeled) Gait Pattern/deviations: Step-through pattern;Decreased stride length;Trunk flexed Gait velocity: decreased Gait velocity interpretation: <1.8 ft/sec, indicate of risk for recurrent falls General Gait Details: Pt ambulating 90 ft  x 2 with one standing rest break in between. Cues for activity pacing. Use of accessory muscles for assistance breathing   Stairs             Wheelchair Mobility    Modified Rankin (Stroke Patients Only)       Balance Overall balance assessment: Needs assistance Sitting-balance support: Feet supported Sitting balance-Leahy Scale: Good     Standing balance support: Bilateral upper extremity supported;During functional activity Standing balance-Leahy Scale: Poor                              Cognition Arousal/Alertness: Awake/alert Behavior During Therapy: WFL for tasks assessed/performed Overall Cognitive Status: Within Functional Limits for tasks assessed                                        Exercises      General Comments        Pertinent Vitals/Pain Pain Assessment: No/denies pain    Home Living                      Prior Function            PT Goals (current goals can now be found in the care plan section) Acute Rehab PT Goals Patient Stated Goal: "Be able to return home" Potential to Achieve Goals: Good Progress towards PT goals: Progressing toward goals    Frequency    Min 3X/week      PT Plan Current plan remains appropriate  Co-evaluation              AM-PAC PT "6 Clicks" Mobility   Outcome Measure  Help needed turning from your back to your side while in a flat bed without using bedrails?: None Help needed moving from lying on your back to sitting on the side of a flat bed without using bedrails?: None Help needed moving to and from a bed to a chair (including a wheelchair)?: A Little Help needed standing up from a chair using your arms (e.g., wheelchair or bedside chair)?: A Little Help needed to walk in hospital room?: A Little Help needed climbing 3-5 steps with a railing? : A Lot 6 Click Score: 19    End of Session Equipment Utilized During Treatment: Gait belt;Oxygen Activity  Tolerance: Patient tolerated treatment well Patient left: Other (comment);with call bell/phone within reach;with family/visitor present(seated EOB) Nurse Communication: Mobility status PT Visit Diagnosis: Difficulty in walking, not elsewhere classified (R26.2)     Time: 7322-0254 PT Time Calculation (min) (ACUTE ONLY): 29 min  Charges:  $Therapeutic Activity: 8-22 mins $Self Care/Home Management: 8-22                     Ellamae Sia, PT, DPT Acute Rehabilitation Services Pager [phone] Office [phone]    Willy Eddy 09/06/2018, 9:13 AM

## 2018-09-06 NOTE — Progress Notes (Signed)
Notified Durene Fruits [facility] today and need for Oxygen set up and Home health PT and RN. Oxygen to be delivered to room prior to DC

## 2018-09-07 ENCOUNTER — Telehealth: Payer: Self-pay

## 2018-09-07 ENCOUNTER — Ambulatory Visit: Payer: Medicare Other | Admitting: Family Medicine

## 2018-09-07 ENCOUNTER — Telehealth: Payer: Self-pay | Admitting: *Deleted

## 2018-09-07 ENCOUNTER — Encounter: Payer: Self-pay | Admitting: *Deleted

## 2018-09-07 DIAGNOSIS — C3490 Malignant neoplasm of unspecified part of unspecified bronchus or lung: Secondary | ICD-10-CM

## 2018-09-07 DIAGNOSIS — R918 Other nonspecific abnormal finding of lung field: Secondary | ICD-10-CM

## 2018-09-07 LAB — CULTURE, BODY FLUID W GRAM STAIN -BOTTLE: Culture: NO GROWTH

## 2018-09-07 NOTE — Telephone Encounter (Signed)
BQ has responded, orders placed appropriately.  Nothing further needed at this time. Forwarding to BQ to make aware.

## 2018-09-07 NOTE — Telephone Encounter (Signed)
-----   Message from Juanito Doom, MD sent at 09/06/2018  3:59 PM EST ----- I explained it all to her thanks ----- Message ----- From: Len Blalock, CMA Sent: 09/06/2018   3:52 PM EST To: Juanito Doom, MD  Hi BQ, I will hold this in my box and order it tomorrow as requested.  Does the pt know she is having this done, or do I need to explain this procedure before we order/schedule it? Thanks! Caryl Pina ----- Message ----- From: Juanito Doom, MD Sent: 09/06/2018   3:31 PM EST To: Valrie Hart, RN, Curt Bears, MD, #  [facility] Caryl Pina,  This lady will need to have a CT guided needle biopsy ordered of her left upper lobe mass. Please order under my name on 1/10 (she is being discharged from the hospital on 1/9).  We also need to have her see me or an NP in clinic the week of 1/13 to make sure all this is being taken care of.    Thanks, [name]

## 2018-09-07 NOTE — Telephone Encounter (Signed)
Spoke with both pt and spouse, scheduled ov with Beth on 09/12/2018 at 2:00.    Order for ct biopsy is pended: I have sent a message to Dr. Lake Bells asking to specify any labs to be ordered in the biopsy.  Will hold message in my box and place order once I receive clarification from him.

## 2018-09-07 NOTE — Telephone Encounter (Signed)
Transition Care Management Follow-up Telephone Call  Date of discharge and from where: [facility] on 09/06/17.  How have you been since you were released from the hospital? Doing good, able to get up and move around more with use of walker. Oxygen is set to 3L currently. Still coughing, but no sputum. Pt is constipated and has started taking stool softners to aid. Declines pain, fever or n/v/d.  Any questions or concerns? No   Items Reviewed:  Did the pt receive and understand the discharge instructions provided? Yes   Medications obtained and verified? Yes   Any new allergies since your discharge? No   Dietary orders reviewed? N/A  Do you have support at home? Yes   Other (ie: DME, Home Health, etc) Awaiting [facility] to come out to home. D/c with oxygen.  Functional Questionnaire: (I = Independent and D = Dependent)  Bathing/Dressing- I   Meal Prep- D  Eating- I  Maintaining continence- I  Transferring/Ambulation- Uses a walker for assistance.  Managing Meds- I   Follow up appointments reviewed:    PCP Hospital f/u appt confirmed? Yes  Scheduled to see Vernie Murders on 09/11/18 @ 1:20 PM.  [facility] f/u appt confirmed? No, pt to set up apt with oncologist.  Are transportation arrangements needed? No   If their condition worsens, is the pt aware to call  their PCP or go to the ED? Yes  Was the patient provided with contact information for the PCP's office or ED? Yes  Was the pt encouraged to call back with questions or concerns? Yes

## 2018-09-07 NOTE — Telephone Encounter (Signed)
thanks

## 2018-09-07 NOTE — Telephone Encounter (Signed)
Oncology Nurse Navigator Documentation  Oncology Nurse Navigator Flowsheets 09/07/2018  Navigator Location [facility]  Referral date to RadOnc/MedOnc 09/06/2018  Navigator Encounter Type Telephone/I received a message from Dr. Julien Nordmann that patient needs MRI and PET scan then appt with him.  I called Ms. Rogalski to update her that she will be getting a call about her scans and an appt with Dr. Julien Nordmann. She was thankful for the update.   Telephone Outgoing Call  Treatment Phase Abnormal Scans  Barriers/Navigation Needs Education;Coordination of Care  Education Other  Interventions Coordination of Care;Education  Coordination of Care Other  Education Method Verbal  Acuity Level 2  Time Spent with Patient 30

## 2018-09-07 NOTE — Telephone Encounter (Signed)
Patient hospitalized

## 2018-09-08 DIAGNOSIS — I2510 Atherosclerotic heart disease of native coronary artery without angina pectoris: Secondary | ICD-10-CM | POA: Diagnosis not present

## 2018-09-08 DIAGNOSIS — M810 Age-related osteoporosis without current pathological fracture: Secondary | ICD-10-CM | POA: Diagnosis not present

## 2018-09-08 DIAGNOSIS — Z79899 Other long term (current) drug therapy: Secondary | ICD-10-CM | POA: Diagnosis not present

## 2018-09-08 DIAGNOSIS — Z9981 Dependence on supplemental oxygen: Secondary | ICD-10-CM | POA: Diagnosis not present

## 2018-09-08 DIAGNOSIS — C3412 Malignant neoplasm of upper lobe, left bronchus or lung: Secondary | ICD-10-CM | POA: Diagnosis not present

## 2018-09-08 DIAGNOSIS — Z7951 Long term (current) use of inhaled steroids: Secondary | ICD-10-CM | POA: Diagnosis not present

## 2018-09-08 DIAGNOSIS — J441 Chronic obstructive pulmonary disease with (acute) exacerbation: Secondary | ICD-10-CM | POA: Diagnosis not present

## 2018-09-08 DIAGNOSIS — E785 Hyperlipidemia, unspecified: Secondary | ICD-10-CM | POA: Diagnosis not present

## 2018-09-08 DIAGNOSIS — J910 Malignant pleural effusion: Secondary | ICD-10-CM | POA: Diagnosis not present

## 2018-09-08 DIAGNOSIS — Z7952 Long term (current) use of systemic steroids: Secondary | ICD-10-CM | POA: Diagnosis not present

## 2018-09-08 DIAGNOSIS — C771 Secondary and unspecified malignant neoplasm of intrathoracic lymph nodes: Secondary | ICD-10-CM | POA: Diagnosis not present

## 2018-09-08 DIAGNOSIS — G629 Polyneuropathy, unspecified: Secondary | ICD-10-CM | POA: Diagnosis not present

## 2018-09-08 DIAGNOSIS — M0579 Rheumatoid arthritis with rheumatoid factor of multiple sites without organ or systems involvement: Secondary | ICD-10-CM | POA: Diagnosis not present

## 2018-09-08 DIAGNOSIS — L931 Subacute cutaneous lupus erythematosus: Secondary | ICD-10-CM | POA: Diagnosis not present

## 2018-09-10 ENCOUNTER — Telehealth: Payer: Self-pay

## 2018-09-10 DIAGNOSIS — J441 Chronic obstructive pulmonary disease with (acute) exacerbation: Secondary | ICD-10-CM | POA: Diagnosis not present

## 2018-09-10 LAB — HIV 1/2 AB DIFFERENTIATION
HIV 1 Ab: NEGATIVE
HIV 2 Ab: NEGATIVE
Note: NEGATIVE

## 2018-09-10 LAB — RNA QUALITATIVE

## 2018-09-10 LAB — HIV ANTIBODY (ROUTINE TESTING W REFLEX): HIV Screen 4th Generation wRfx: REACTIVE — AB

## 2018-09-10 NOTE — Telephone Encounter (Signed)
Verbal orders for PT, OT, Nurse Aid and Nursing for once a week for 5 weeks. Patient was discharge from [facility] for COPD exacerbation and Stage 4 lung cancer. Patient usually sees [facility].

## 2018-09-10 NOTE — Telephone Encounter (Signed)
Advised  ED 

## 2018-09-11 ENCOUNTER — Encounter: Payer: Self-pay | Admitting: Family Medicine

## 2018-09-11 ENCOUNTER — Telehealth: Payer: Self-pay | Admitting: Internal Medicine

## 2018-09-11 ENCOUNTER — Ambulatory Visit (INDEPENDENT_AMBULATORY_CARE_PROVIDER_SITE_OTHER): Payer: Medicare Other | Admitting: Family Medicine

## 2018-09-11 ENCOUNTER — Encounter: Payer: Self-pay | Admitting: Internal Medicine

## 2018-09-11 VITALS — BP 160/60 | HR 69 | Temp 97.8°F | Resp 18

## 2018-09-11 DIAGNOSIS — M059 Rheumatoid arthritis with rheumatoid factor, unspecified: Secondary | ICD-10-CM | POA: Diagnosis not present

## 2018-09-11 DIAGNOSIS — J441 Chronic obstructive pulmonary disease with (acute) exacerbation: Secondary | ICD-10-CM | POA: Diagnosis not present

## 2018-09-11 DIAGNOSIS — J910 Malignant pleural effusion: Secondary | ICD-10-CM

## 2018-09-11 DIAGNOSIS — I10 Essential (primary) hypertension: Secondary | ICD-10-CM | POA: Diagnosis not present

## 2018-09-11 DIAGNOSIS — J9601 Acute respiratory failure with hypoxia: Secondary | ICD-10-CM

## 2018-09-11 NOTE — Progress Notes (Signed)
Patient: Donna Mcneil Female    DOB: 03-24-1941   78 y.o.   MRN: [id number] Visit Date: 09/11/2018  Today's Provider: Vernie Murders, PA   Chief Complaint  Patient presents with  . Hospitalization Follow-up   Subjective:     HPI   Follow up Hospitalization  Patient was admitted to [facility] on 09/01/2018 and discharged on 09/06/2018. Patient here with her sister Donna Mcneil, she leaves in [place]. She was treated for Seropositive rheumatoid arthritis,Hyperlipidemia,Hypertension,Tobacco use, Rheumatoid arthritis involving multiple sites with positive rheumatoid factor,COPD exacerbation, Acute respiratory failure Hyponatremia, Malignant pleural effusion and Pulmonary Nodule. Treatment for this included oxygen at discharge-oxygen at home is set to 3L currently. Recommending home health.Increased the dose of Amlodipine. She is going to be followed by Pulmonary and Oncology. Telephone follow up was done on 09/07/2018 She reports excellent compliance with treatment. She reports this condition is Improved. Patient scheduled with Pulmonary tomorrow and they are going to do the biopsy. ------------------------------------------------------------------------------------ Past Medical History:  Diagnosis Date  . Diverticulitis   . Hypercholesteremia   . Hypertension   . Lupus ([place])   . Osteoporosis   . Tobacco use    Past Surgical History:  Procedure Laterality Date  . ABDOMINAL HYSTERECTOMY  12/1969  . APPENDECTOMY  1957  . CATARACT EXTRACTION    . CHOLECYSTECTOMY  1997  . LAPAROSCOPIC SIGMOID COLECTOMY  2013   secondary to diverticulitis  . spit seed removal    . TONSILLECTOMY  1945   Family History  Problem Relation Age of Onset  . Alcohol abuse Mother   . Cirrhosis Mother   . Heart disease Father   . Hypertension Father   . Gout Father   . Ulcers Father   . Hyperlipidemia Father   . Alcohol abuse Brother   . Heart disease Brother        MI at age 79  . OCD Son     . Prostatitis Paternal Grandfather   . Pneumonia Paternal Grandfather   . Cancer Son    Allergies  Allergen Reactions  . Codeine Other (See Comments)    GI upset  . Cyclosporine Other (See Comments)    Burning eyes  . Other     Other reaction(s): OTHER  . Pregabalin Other (See Comments)    Personality changes-becomes angry  . Zolpidem     Other reaction(s): ITCHING    Current Outpatient Medications:  .  acetaminophen (TYLENOL) 500 MG tablet, Take 1,000 mg by mouth every 6 (six) hours as needed for mild pain. , Disp: , Rfl:  .  albuterol (PROVENTIL) (2.5 MG/3ML) 0.083% nebulizer solution, USE 1 VIAL IN NEBULIZER AS DIRECTED EVERY 4 HOURS AS NEEDED (Patient taking differently: Take 2.5 mg by nebulization every 4 (four) hours as needed for wheezing or shortness of breath. ), Disp: 180 mL, Rfl: 2 .  ALPRAZolam (XANAX) 0.5 MG tablet, TAKE 1 TABLET BY MOUTH TWICE A DAY AS NEEDED (Patient taking differently: Take 0.75 mg by mouth at bedtime. ), Disp: 60 tablet, Rfl: 0 .  amLODipine (NORVASC) 10 MG tablet, Take 1 tablet (10 mg total) by mouth daily., Disp: 30 tablet, Rfl: 0 .  amoxicillin-clavulanate (AUGMENTIN) 875-125 MG tablet, Take 1 tablet by mouth every 12 (twelve) hours., Disp: 1 tablet, Rfl: 0 .  aspirin 81 MG tablet, Take 81 mg by mouth daily., Disp: , Rfl:  .  atorvastatin (LIPITOR) 40 MG tablet, TAKE 1 TABLET BY MOUTH ONCE DAILY (Patient taking differently: Take  40 mg by mouth every evening. ), Disp: 90 tablet, Rfl: 3 .  betamethasone dipropionate (DIPROLENE) 0.05 % cream, Apply 8.65 application topically 2 (two) times daily as needed (skin care). , Disp: , Rfl:  .  BIOTIN PO, Take 5,000 mcg by mouth daily. , Disp: , Rfl:  .  Calcium Carbonate-Vitamin D (CALCIUM 600+D) 600-200 MG-UNIT TABS, Take 1 tablet by mouth 2 (two) times daily. , Disp: , Rfl:  .  Carboxymethylcellul-Glycerin (OPTIVE) 0.5-0.9 % SOLN, Place 1 drop into both eyes 3 (three) times daily as needed for dry eyes. ,  Disp: , Rfl:  .  carboxymethylcellulose (REFRESH TEARS) 0.5 % SOLN, Place 1 drop into both eyes 3 (three) times daily as needed (dry eyes). , Disp: , Rfl:  .  cetirizine (ZYRTEC) 10 MG tablet, Take 10 mg by mouth daily., Disp: , Rfl:  .  clobetasol (TEMOVATE) 0.05 % external solution, Apply 1 application topically daily as needed (skin care). , Disp: , Rfl:  .  Coenzyme Q10 (COQ10) 100 MG CAPS, Take 100 mg by mouth daily., Disp: , Rfl:  .  docusate sodium (COLACE) 100 MG capsule, Take 100 mg by mouth 2 (two) times daily., Disp: , Rfl:  .  folic acid (FOLVITE) 784 MCG tablet, Take 1,000 mcg by mouth daily. , Disp: , Rfl:  .  gabapentin (NEURONTIN) 100 MG capsule, Take 100 mg by mouth 3 (three) times daily. , Disp: , Rfl:  .  guaiFENesin (MUCINEX) 600 MG 12 hr tablet, Take 1 tablet (600 mg total) by mouth 2 (two) times daily., Disp: 14 tablet, Rfl: 0 .  hydrochlorothiazide (HYDRODIURIL) 50 MG tablet, TAKE 1 TABLET BY MOUTH ONCE A DAY (Patient taking differently: Take 50 mg by mouth daily. ), Disp: 30 tablet, Rfl: 12 .  hydroxychloroquine (PLAQUENIL) 200 MG tablet, Take 200 mg by mouth daily. , Disp: , Rfl:  .  KRILL OIL PO, Take 500 mg by mouth daily. , Disp: , Rfl:  .  methotrexate (RHEUMATREX) 2.5 MG tablet, Take 20 mg by mouth every Monday. , Disp: , Rfl:  .  Multiple Vitamins-Minerals (MULTIVITAMIN ADULT PO), Take 1 tablet by mouth daily. , Disp: , Rfl:  .  Omega 3 1000 MG CAPS, Take 3 capsules by mouth daily., Disp: , Rfl:  .  Potassium 99 MG TABS, Take 1 tablet by mouth daily. , Disp: , Rfl:  .  predniSONE (DELTASONE) 10 MG tablet, Take 4 pills daily for 3 days then 3 pills daily for 3 days then 2 pills daily for 3 days then 1 pill daily for 3 days then stop, Disp: 30 tablet, Rfl: 0 .  PROAIR HFA 108 (90 Base) MCG/ACT inhaler, INHALE 2 PUFFS EVERY 6 HOURS AS NEEDED (Patient taking differently: Inhale 2 puffs into the lungs every 6 (six) hours as needed for wheezing or shortness of breath. ),  Disp: 8.5 g, Rfl: 3 .  ranitidine (ZANTAC) 150 MG tablet, Take 150 mg by mouth daily as needed for heartburn. , Disp: , Rfl:  .  Vitamin D, Cholecalciferol, 1000 units TABS, Take 1 tablet by mouth at bedtime. , Disp: , Rfl:   Review of Systems  Constitutional: Negative.   HENT: Negative.   Eyes: Negative.   Respiratory: Positive for shortness of breath and wheezing.   Cardiovascular: Positive for leg swelling.  Gastrointestinal: Negative.   Genitourinary: Negative.     Social History   Tobacco Use  . Smoking status: Smoker, Current Status Unknown    Packs/day:  0.50    Types: Cigarettes  . Smokeless tobacco: Never Used  . Tobacco comment: started smoking at age 23  Substance Use Topics  . Alcohol use: No    Alcohol/week: 0.0 standard drinks     Objective:   BP (!) 160/60 (BP Location: Right Arm, Patient Position: Sitting, Cuff Size: Normal)   Pulse 69   Temp 97.8 F (36.6 C) (Oral)   Resp 18   SpO2 (!) 80% Comment: on 3 L  of oxygen 80% while walking and 91% at rest   Wt Readings from Last 3 Encounters:  09/02/18 145 lb 8 oz (66 kg)  05/14/18 149 lb 12.8 oz (67.9 kg)  04/17/18 150 lb 9.6 oz (68.3 kg)   Vitals:   09/11/18 1337  BP: (!) 160/60  Pulse: 69  Resp: 18  Temp: 97.8 F (36.6 C)  TempSrc: Oral  SpO2: (!) 80%   Physical Exam Constitutional:      General: She is not in acute distress.    Appearance: She is well-developed.  HENT:     Head: Normocephalic and atraumatic.     Right Ear: Hearing normal.     Left Ear: Hearing normal.     Nose: Nose normal.  Eyes:     General: Lids are normal. No scleral icterus.       Right eye: No discharge.        Left eye: No discharge.     Conjunctiva/sclera: Conjunctivae normal.  Neck:     Musculoskeletal: Normal range of motion and neck supple.  Cardiovascular:     Rate and Rhythm: Normal rate and regular rhythm.     Heart sounds: Normal heart sounds.  Pulmonary:     Effort: Respiratory distress present.      Breath sounds: No wheezing.     Comments: Distant breath sounds. Abdominal:     Palpations: Abdomen is soft.  Musculoskeletal: Normal range of motion.        General: Swelling present.     Comments: Pitting edema in both lower legs (1+ up to mid calf on the left and 2+ mid calf to ankle on the right).  Skin:    Findings: No lesion or rash.  Neurological:     Mental Status: She is alert and oriented to person, place, and time.  Psychiatric:        Speech: Speech normal.        Behavior: Behavior normal.        Thought Content: Thought content normal.       Assessment & Plan    1. Malignant pleural effusion Hospitalized 09-01-18 through 09-06-18 with a 1 week history of acute exacerbation of COPD. She failed outpatient therapy with Azithromycin. Reported she had lost 30 lbs over the past several month. Found to have a left sided loculated pleural effusion with nodularity in the left lung and metastatic adenopathy. Pulmonary consult attempted bedside thoracentesis then CT-guided drain placement on 09-02-18. Cytology of pleural fluid showed atypical cells suspicious for adenocarcinoma but was and inadequate amount for molecular diagnosis. She was discharged on 09-06-18 and plans biopsy tomorrow.  2. Acute respiratory failure with hypoxia (HCC) Acute on chronic respiratory failure with hypoxia persists with pulse oximetry 80% after walking 50 feet on 3 LPM oxygen by nasal cannula. After sitting 5-10 minutes, pulse oximetry rose to 91% on 3 LPM. Acute hypoxia secondary to malignant left-sided pleural effusion and COPD exacerbation. Oxygen medically necessary continuously at 3 LPM. She will proceed with pulmonology  and oncology follow up in the next couple days for further management plan.  3. COPD exacerbation (HCC) Acute exacerbation of COPD with emphysema noted during follow up with rheumatologist (Dr. Jefm Bryant) and was started on Z-pak with prednisone taper on 08-28-18. Did no improve much and went  to the ER on 09-01-18. Feels congestion improved and no fever since discharge on 09-06-18. Stopped smoking with this hospitalization.  4. Seropositive rheumatoid arthritis ([place]) Presently on Methotrexate and Plaquenil and followed by rheumatologist. Joint aches continue. Scheduled for assessment for personal care services at home with RA and pulmonary issues limiting ADL abilities.  5. Essential hypertension Increase in BP during hospitalization. Amlodipine was increased from 5 mg to 10 mg qd and continued on the HCTZ 50 mg qd. Echocardiogram showed EF of 65-70% with some persistent peripheral edema. No JVD noted today. Will recheck pending stabilization of pulmonary status.     Vernie Murders, PA  [facility]

## 2018-09-11 NOTE — Telephone Encounter (Signed)
A hospital follow up appt has been scheduled for the pt to see Dr. Julien Nordmann on 1/29 at 130pm w/labs at 1pm. I cld and lft the appt date and time on the pt's phone. Letter mailed.

## 2018-09-12 ENCOUNTER — Encounter: Payer: Self-pay | Admitting: Primary Care

## 2018-09-12 ENCOUNTER — Ambulatory Visit (INDEPENDENT_AMBULATORY_CARE_PROVIDER_SITE_OTHER): Payer: Medicare Other | Admitting: Primary Care

## 2018-09-12 ENCOUNTER — Ambulatory Visit (INDEPENDENT_AMBULATORY_CARE_PROVIDER_SITE_OTHER)
Admission: RE | Admit: 2018-09-12 | Discharge: 2018-09-12 | Disposition: A | Discharge status: Home / Self Care | Payer: Medicare Other | Source: Ambulatory Visit | Attending: Primary Care | Admitting: Primary Care

## 2018-09-12 ENCOUNTER — Other Ambulatory Visit: Payer: Self-pay

## 2018-09-12 VITALS — BP 150/64 | HR 62 | Temp 98.1°F | Ht 62.0 in | Wt 143.0 lb

## 2018-09-12 DIAGNOSIS — R918 Other nonspecific abnormal finding of lung field: Secondary | ICD-10-CM | POA: Diagnosis not present

## 2018-09-12 DIAGNOSIS — J910 Malignant pleural effusion: Secondary | ICD-10-CM | POA: Diagnosis not present

## 2018-09-12 DIAGNOSIS — R0602 Shortness of breath: Secondary | ICD-10-CM | POA: Diagnosis not present

## 2018-09-12 DIAGNOSIS — M0579 Rheumatoid arthritis with rheumatoid factor of multiple sites without organ or systems involvement: Secondary | ICD-10-CM | POA: Diagnosis not present

## 2018-09-12 DIAGNOSIS — J449 Chronic obstructive pulmonary disease, unspecified: Secondary | ICD-10-CM | POA: Diagnosis not present

## 2018-09-12 DIAGNOSIS — R05 Cough: Secondary | ICD-10-CM | POA: Diagnosis not present

## 2018-09-12 LAB — CBC
HCT: 39.6 % (ref 36.0–46.0)
HEMOGLOBIN: 13.5 g/dL (ref 12.0–15.0)
MCHC: 34.2 g/dL (ref 30.0–36.0)
MCV: 93.5 fl (ref 78.0–100.0)
Platelets: 167 10*3/uL (ref 150.0–400.0)
RBC: 4.23 Mil/uL (ref 3.87–5.11)
RDW: 14.7 % (ref 11.5–15.5)
WBC: 10.4 10*3/uL (ref 4.0–10.5)

## 2018-09-12 LAB — SEDIMENTATION RATE: Sed Rate: 55 mm/hr — ABNORMAL HIGH (ref 0–30)

## 2018-09-12 LAB — ALT: ALT: 38 U/L — ABNORMAL HIGH (ref 0–35)

## 2018-09-12 LAB — AST: AST: 21 U/L (ref 0–37)

## 2018-09-12 LAB — CREATININE, SERUM: Creatinine, Ser: 0.64 mg/dL (ref 0.40–1.20)

## 2018-09-12 LAB — ALBUMIN: ALBUMIN: 3.7 g/dL (ref 3.5–5.2)

## 2018-09-12 MED ORDER — UMECLIDINIUM-VILANTEROL 62.5-25 MCG/INH IN AEPB: 1.0000 | INHALATION_SPRAY | Freq: Every day | RESPIRATORY_TRACT | 0 refills | Status: DC

## 2018-09-12 MED ORDER — UMECLIDINIUM-VILANTEROL 62.5-25 MCG/INH IN AEPB: 1.0000 | INHALATION_SPRAY | Freq: Every day | RESPIRATORY_TRACT | 3 refills | Status: DC

## 2018-09-12 NOTE — Progress Notes (Signed)
@Patient  ID: Donna Mcneil, female    DOB: [birth year], 78 y.o.   MRN: [id number]  Chief Complaint  Patient presents with  . Follow-up    breathing better, SOB with exertion, dry cough,     Referring provider: [name], PA  HPI: 78 year old female, former smoker quit Jan 2020. PMH significant for RA (on methotrexate), lupus, COPD, left upper lung mass, malignant pleural effusion. New patient to [facility], seen in-patient. Recent hospital admission for acute respiratory failure secondary to left sided pleural effusion, COPD and new dx lung cancer.   Hospital course 1/4-1/9: Presented with complaints of shortness of breath and flu-like symptoms x 1 week. Failed outpatient antibiotic therapy with azithromycin. Long history of smoking. CTA in ED showed a spiculated 1.9 x 1.6cm anterior left upper lobe nodule suspicious of for primary bronchogenic malignancy. There was also moderate sized partially loculated left pleural effusion. On 1/5 the patient underwent CT guided placement of left pleural catheter for drainage of loculated pleural effusion. 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 Oncology felt cytology sample was not adequate for molecular diagnosis. Needs CT guided needle biopsy outpatient. She has been set up with appointments for MRI brain, PET scan and follow-up with med/onc.   09/13/2018 Patient presents today for hospital follow-up. Accompanied by female care giver. Feels her breathing is better, still has baseline shortness of breath on exertion and dry cough. She is not on a maintenance regimen for COPD. She has been using Albuterol nebulizer every 4 hours and rescue inhaler as needed 2-3 times a day. She has two days left of prednisone taper. She has been set up with appointments for MRI brain, PET scan and follow-up with med/onc. Still needs date/time for CT guided needle biopsy regarding new LUL mass for molecular diagnosis.    Allergies  Allergen Reactions  . Codeine Other (See Comments)    GI upset  . Cyclosporine Other (See Comments)    Burning eyes  . Other     Other reaction(s): OTHER  . Pregabalin Other (See Comments)    Personality changes-becomes angry  . Zolpidem     Other reaction(s): ITCHING    Immunization History  Administered Date(s) Administered  . Influenza Split 08/15/2014  . Influenza, High Dose Seasonal PF 07/04/2017, 05/14/2018  . Influenza, Seasonal, Injecte, Preservative Fre 07/12/2010, 07/04/2011  . Influenza-Unspecified 07/17/2015, 07/02/2016  . Pneumococcal Conjugate-13 07/04/2017  . Pneumococcal Polysaccharide-23 07/12/2010, 07/17/2015    Past Medical History:  Diagnosis Date  . COPD exacerbation ([place]) 09/01/2018  . Diverticulitis   . Hypercholesteremia   . Hypertension   . Lupus ([place])   . Osteoporosis   . Tobacco use     Tobacco History: Social History   Tobacco Use  Smoking Status Former Smoker  . Packs/day: 0.50  . Types: Cigarettes  . Last attempt to quit: 09/01/2018  . Years since quitting: 0.0  Smokeless Tobacco Never Used  Tobacco Comment   started smoking at age 41   Counseling given: Not Answered Comment: started smoking at age 50   Outpatient Medications Prior to Visit  Medication Sig Dispense Refill  . acetaminophen (TYLENOL) 500 MG tablet Take 1,000 mg by mouth every 6 (six) hours as needed for mild pain.     Marland Kitchen albuterol (PROVENTIL) (2.5 MG/3ML) 0.083% nebulizer solution USE 1 VIAL IN NEBULIZER AS DIRECTED EVERY 4 HOURS AS NEEDED (Patient taking differently: Take 2.5 mg by nebulization every 4 (four) hours as needed for wheezing or  shortness of breath. ) 180 mL 2  . ALPRAZolam (XANAX) 0.5 MG tablet TAKE 1 TABLET BY MOUTH TWICE A DAY AS NEEDED (Patient taking differently: Take 1 mg by mouth at bedtime. ) 60 tablet 0  . amLODipine (NORVASC) 10 MG tablet Take 1 tablet (10 mg total) by mouth daily. 30 tablet 0  . aspirin 81 MG tablet Take 81 mg by  mouth daily.    Marland Kitchen atorvastatin (LIPITOR) 40 MG tablet TAKE 1 TABLET BY MOUTH ONCE DAILY (Patient taking differently: Take 40 mg by mouth every evening. ) 90 tablet 3  . b complex vitamins tablet Take 2 tablets by mouth 2 (two) times daily.    Marland Kitchen BIOTIN PO Take 5,000 mcg by mouth daily.     . Calcium Carbonate-Vitamin D (CALCIUM 600+D) 600-200 MG-UNIT TABS Take 1 tablet by mouth 2 (two) times daily.     . carboxymethylcellulose (REFRESH TEARS) 0.5 % SOLN Place 1 drop into both eyes 3 (three) times daily as needed (dry eyes).     . Coenzyme Q10 (COQ10) 100 MG CAPS Take 100 mg by mouth daily.    Marland Kitchen docusate sodium (COLACE) 100 MG capsule Take 100 mg by mouth 2 (two) times daily.    . folic acid (FOLVITE) 106 MCG tablet Take 1,000 mcg by mouth daily.     Marland Kitchen gabapentin (NEURONTIN) 100 MG capsule Take 100 mg by mouth 3 (three) times daily.     Marland Kitchen guaiFENesin (MUCINEX) 600 MG 12 hr tablet Take 1 tablet (600 mg total) by mouth 2 (two) times daily. 14 tablet 0  . hydrochlorothiazide (HYDRODIURIL) 50 MG tablet TAKE 1 TABLET BY MOUTH ONCE A DAY (Patient taking differently: Take 50 mg by mouth daily. ) 30 tablet 12  . hydroxychloroquine (PLAQUENIL) 200 MG tablet Take 200 mg by mouth daily.     Marland Kitchen KRILL OIL PO Take 500 mg by mouth daily.     . methotrexate (RHEUMATREX) 2.5 MG tablet Take 20 mg by mouth every Monday.     . Multiple Vitamins-Minerals (MULTIVITAMIN ADULT PO) Take 1 tablet by mouth daily.     . Omega 3 1000 MG CAPS Take 3 capsules by mouth daily.    . Potassium 99 MG TABS Take 1 tablet by mouth daily.     . predniSONE (DELTASONE) 10 MG tablet Take 4 pills daily for 3 days then 3 pills daily for 3 days then 2 pills daily for 3 days then 1 pill daily for 3 days then stop 30 tablet 0  . PROAIR HFA 108 (90 Base) MCG/ACT inhaler INHALE 2 PUFFS EVERY 6 HOURS AS NEEDED (Patient taking differently: Inhale 2 puffs into the lungs every 6 (six) hours as needed for wheezing or shortness of breath. ) 8.5 g 3  .  Vitamin D, Cholecalciferol, 1000 units TABS Take 1 tablet by mouth at bedtime.     Marland Kitchen amoxicillin-clavulanate (AUGMENTIN) 875-125 MG tablet Take 1 tablet by mouth every 12 (twelve) hours. 1 tablet 0  . betamethasone dipropionate (DIPROLENE) 0.05 % cream Apply 2.69 application topically 2 (two) times daily as needed (skin care).     . Carboxymethylcellul-Glycerin (OPTIVE) 0.5-0.9 % SOLN Place 1 drop into both eyes 3 (three) times daily as needed for dry eyes.     . cetirizine (ZYRTEC) 10 MG tablet Take 10 mg by mouth daily.    . clobetasol (TEMOVATE) 0.05 % external solution Apply 1 application topically daily as needed (skin care).     . ranitidine (ZANTAC)  150 MG tablet Take 150 mg by mouth daily as needed for heartburn.      No facility-administered medications prior to visit.     Review of Systems  Review of Systems  Constitutional: Negative.   HENT: Negative.   Respiratory: Positive for cough and shortness of breath. Negative for wheezing.   Cardiovascular: Negative.     Physical Exam  BP (!) 150/64   Pulse 62   Temp 98.1 F (36.7 C)   Ht 5\' 2"  (1.575 m)   Wt 143 lb (64.9 kg)   SpO2 97%   BMI 26.16 kg/m  Physical Exam Constitutional:      Appearance: Normal appearance. She is not ill-appearing.  HENT:     Head: Normocephalic and atraumatic.     Nose: Nose normal.     Mouth/Throat:     Mouth: Mucous membranes are moist.     Pharynx: Oropharynx is clear.  Neck:     Musculoskeletal: Normal range of motion and neck supple.  Cardiovascular:     Rate and Rhythm: Normal rate.  Pulmonary:     Effort: Pulmonary effort is normal.     Comments: Diminished, no wheezes 3L oxygen  Skin:    General: Skin is warm and dry.  Neurological:     General: No focal deficit present.     Mental Status: She is alert. Mental status is at baseline.  Psychiatric:        Mood and Affect: Mood normal.        Behavior: Behavior normal.        Thought Content: Thought content normal.         Judgment: Judgment normal.      Lab Results:  CBC    Component Value Date/Time   WBC 10.4 09/12/2018 1506   RBC 4.23 09/12/2018 1506   HGB 13.5 09/12/2018 1506   HGB 15.1 05/14/2018 1225   HCT 39.6 09/12/2018 1506   HCT 41.0 05/14/2018 1225   PLT  09/12/2018 1506    167.0 Result may be falsely decreased due to platelet clumping.   PLT 224 05/14/2018 1225   MCV 93.5 09/12/2018 1506   MCV 91 05/14/2018 1225   MCV 93 04/23/2012 0640   MCH 31.1 09/05/2018 0400   MCHC 34.2 09/12/2018 1506   RDW 14.7 09/12/2018 1506   RDW 13.8 05/14/2018 1225   RDW 14.6 (H) 04/23/2012 0640   LYMPHSABS 0.5 (L) 09/05/2018 0400   LYMPHSABS 1.9 05/14/2018 1225   LYMPHSABS 0.5 (L) 04/23/2012 0640   MONOABS 0.6 09/05/2018 0400   MONOABS 0.7 04/23/2012 0640   EOSABS 0.0 09/05/2018 0400   EOSABS 0.1 05/14/2018 1225   EOSABS 0.0 04/23/2012 0640   BASOSABS 0.0 09/05/2018 0400   BASOSABS 0.1 05/14/2018 1225   BASOSABS 0.0 04/23/2012 0640    BMET    Component Value Date/Time   NA 135 09/05/2018 0400   NA 136 05/14/2018 1225   NA 134 (L) 04/23/2012 0640   K 5.1 09/05/2018 0400   K 2.8 (L) 04/23/2012 0640   CL 100 09/05/2018 0400   CL 91 (L) 04/23/2012 0640   CO2 26 09/05/2018 0400   CO2 32 04/23/2012 0640   GLUCOSE 132 (H) 09/05/2018 0400   GLUCOSE 117 (H) 04/23/2012 0640   BUN 26 (H) 09/05/2018 0400   BUN 8 05/14/2018 1225   BUN 13 04/23/2012 0640   CREATININE 0.64 09/12/2018 1506   CREATININE 0.72 07/04/2017 1045   CALCIUM 8.5 (L) 09/05/2018 0400  CALCIUM 8.3 (L) 04/23/2012 0640   GFRNONAA >60 09/05/2018 0400   GFRNONAA 81 07/04/2017 1045   GFRAA >60 09/05/2018 0400   GFRAA 94 07/04/2017 1045    BNP    Component Value Date/Time   BNP 266.0 (H) 09/01/2018 1818    ProBNP No results found for: PROBNP  Imaging: Dg Chest 2 View  Result Date: 09/12/2018 CLINICAL DATA:  Fluid build-up. Shortness of breath. Dry cough. Former smoker. History of COPD and chronic bronchitis.  Hypertension. EXAM: CHEST - 2 VIEW COMPARISON:  Chest 09/05/2018. CT chest 09/01/2018. FINDINGS: Cardiac enlargement. Increasing interstitial pattern to the lungs may represent developing edema superimposed on chronic fibrosis. Blunting of the left costophrenic angle suggesting a small effusion. Mild fluid in the fissures. Diffuse peribronchial thickening with central interstitial changes consistent with chronic bronchitis. Bilateral apical scarring. Calcified and tortuous aorta. Degenerative changes in the spine. IMPRESSION: Cardiac enlargement with increasing interstitial pattern to the lungs suggesting developing edema superimposed on chronic fibrosis. Small left pleural effusion. Chronic bronchitic changes. Aortic atherosclerosis. Electronically Signed   By: Lucienne Capers M.D.   On: 09/12/2018 22:01   Ct Angio Chest Pe W/cm &/or Wo Cm  Result Date: 09/01/2018 CLINICAL DATA:  Shortness of breath. EXAM: CT ANGIOGRAPHY CHEST WITH CONTRAST TECHNIQUE: Multidetector CT imaging of the chest was performed using the standard protocol during bolus administration of intravenous contrast. Multiplanar CT image reconstructions and MIPs were obtained to evaluate the vascular anatomy. CONTRAST:  52 mL ISOVUE-370 IOPAMIDOL (ISOVUE-370) INJECTION 76% COMPARISON:  Chest radiograph 09/01/2018 FINDINGS: Cardiovascular: There are no filling defects within the pulmonary arteries to suggest pulmonary embolus. Diffuse atherosclerosis of the thoracic aorta without aneurysm or dissection. Conventional branching pattern from the aortic arch. There are coronary artery calcifications. Heart is normal in size. Small pericardial effusion measures up to 10 mm in depth. Mediastinum/Nodes: Prominent left hilar lymph nodes largest measuring up to 13 mm, image 65 series 5. Prominent subcarinal node measures 12 mm short axis. Small high mediastinal nodes are not enlarged by size criteria. No enlarged right hilar lymph nodes. No axillary  adenopathy. Esophagus is decompressed. No visualized thyroid nodule. Lungs/Pleura: Moderate size left pleural effusion is partially loculated. Associated compressive atelectasis in the left lower and to a lesser extent upper lobes. Suspect spiculated 19 x 16 mm anterior left upper lobe nodule image 68 series 6, abutting the pleural surface. Fissural nodularity extends throughout the left interlobar fissure with possible tracking pleural fluid. Dominant fissural nodule measures 9 mm image 35 series 6. Moderate emphysema. There is narrowing of the left upper and lower lobe bronchi with mucous plugging in the lingula. Additional areas of mucous plugging in the right lower lobe. Right middle lobe pleural based nodule measures 4 mm, image 85 series 6. 5 mm right apical nodule, image 19. Additional tiny subpleural nodules in the right lung. Upper Abdomen: Motion artifact through the abdomen. Left adrenal thickening without dominant nodule. Atherosclerotic plaque at the diaphragmatic hiatus causes luminal narrowing of the aorta of greater than 50%. Musculoskeletal: No blastic or destructive lytic lesions. Review of the MIP images confirms the above findings. IMPRESSION: 1. Suspect spiculated 19 x 16 mm anterior left upper lobe nodule, suspicious for primary bronchogenic malignancy. Fissural nodularity extends throughout the left interlobar fissure. Enlarged left hilar lymph node and prominent subcarinal node. Consider multi disciplinary pulmonary referral for further workup. 2. Moderate size partially loculated left pleural effusion with compressive atelectasis in the left lower and to a lesser extent upper lobes. 3.  Small nodules in the right lung, including a 5 mm right apical nodule that is slightly irregular, nonspecific. 4. No pulmonary embolus. 5. Moderate emphysema. Areas of mucous plugging involves the left upper lobe and right lower lobes. 6. Advanced aortic atherosclerosis. Calcified plaque at the diaphragmatic  hiatus causes luminal narrowing of greater than 50%. Aortic Atherosclerosis (ICD10-I70.0) and Emphysema (ICD10-J43.9). Electronically Signed   By: Keith Rake M.D.   On: 09/01/2018 22:40   Dg Chest Port 1 View  Result Date: 09/05/2018 CLINICAL DATA:  78 year old female status post left chest tube removal EXAM: PORTABLE CHEST 1 VIEW COMPARISON:  Multiple prior most recent chest x-ray 09/03/2018, CT 09/02/2018 FINDINGS: Cardiomediastinal silhouette unchanged. Chronic interstitial opacities of the lungs, similar to the prior. Unchanged appearance of ill-defined mixed interstitial and airspace opacity at the left lung base. Interval removal of pigtail drainage catheter from the left pleural space with no visualized pneumothorax. Blunting at the left costophrenic angle persists. Aeration on the right is maintained. IMPRESSION: Interval removal of left thoracostomy tube with no visualized pneumothorax. Similar appearance of mixed interstitial and airspace opacities at the left lung base with likely residual small volume pleural thickening/fluid. Electronically Signed   By: Corrie Mckusick D.O.   On: 09/05/2018 15:15   Dg Chest Port 1 View  Result Date: 09/03/2018 CLINICAL DATA:  Pleural effusion EXAM: PORTABLE CHEST 1 VIEW COMPARISON:  CT chest 09/01/2018 FINDINGS: The lungs are hyperinflated likely secondary to COPD. There is a small left pleural effusion. There is a left pleural drainage catheter present. There is chronic bilateral interstitial thickening. There is no pneumothorax. The heart mediastinum are stable. The osseous structures are unremarkable. IMPRESSION: 1. Small left pleural effusion with a left pleural drainage catheter present. No pneumothorax. Electronically Signed   By: Kathreen Devoid   On: 09/03/2018 16:04   Dg Chest Port 1 View  Result Date: 09/01/2018 CLINICAL DATA:  Short of breath. EXAM: PORTABLE CHEST 1 VIEW COMPARISON:  None. FINDINGS: Cardiac silhouette is partly obscured by left lung  base opacity. Is grossly normal in size. No mediastinal or hilar masses. Opacity at the left lung base is consistent with a moderate pleural effusion with associated atelectasis or pneumonia. Lungs show prominent bronchovascular markings and mild lower lung zone interstitial thickening, presumed chronic. Mild scarring noted at the apices. No convincing right pleural effusion.  No pneumothorax. Skeletal structures are grossly intact. IMPRESSION: 1. Moderate left pleural effusion associated with left lung base atelectasis or pneumonia. 2. No other evidence of acute cardiopulmonary disease. Electronically Signed   By: Lajean Manes M.D.   On: 09/01/2018 18:49   Ct Image Guided Drainage By Percutaneous Catheter  Result Date: 09/02/2018 CLINICAL DATA:  Loculated left pleural effusion and left upper lobe pulmonary nodule. There has been inability to obtain pleural fluid with attempted bedside thoracentesis. Request has been made to place a pleural drainage catheter. EXAM: CT GUIDED LEFT PLEURAL DRAINAGE CATHETER PLACEMENT ANESTHESIA/SEDATION: 4.0 mg IV Versed 100 mcg IV Fentanyl Total Moderate Sedation Time:  24 minutes The patient's level of consciousness and physiologic status were continuously monitored during the procedure by Radiology nursing. PROCEDURE: The procedure, risks, benefits, and alternatives were explained to the patient. Questions regarding the procedure were encouraged and answered. The patient understands and consents to the procedure. A time out was performed prior to initiating the procedure. CT was performed in a supine position with the left side rolled up. The left lateral chest wall was prepped with chlorhexidine in a sterile fashion,  and a sterile drape was applied covering the operative field. A sterile gown and sterile gloves were used for the procedure. Local anesthesia was provided with 1% Lidocaine. Under CT guidance, an 18 gauge trocar needle was advanced into the left pleural space.  Fluid was aspirated. A guidewire was advanced in the needle removed. The percutaneous tract was dilated over the guidewire and a 12 French pigtail drainage catheter placed over the wire. Catheter position was confirmed by CT. Additional fluid was aspirated and sent for requested laboratory testing. The catheter was then connected to a [place] Pleur-evac and wall suction at -20 cm water. COMPLICATIONS: None FINDINGS: Initial aspiration yielded grossly bloody left pleural fluid. After placement of the pigtail pleural drain, there is good return of bloody fluid. A sample of 120 mL of fluid was sent for requested laboratory testing. IMPRESSION: CT-guided placement of left pleural drainage catheter for drainage of a loculated left pleural effusion. There was return of grossly bloody pleural fluid. A 12 French catheter was placed and attached to a Pleur-evac device and wall suction at -20 cm of water. A sample of fluid was sent for requested laboratory testing. Electronically Signed   By: Aletta Edouard M.D.   On: 09/02/2018 12:50   Vas [place] Lower Extremity Venous (dvt)  Result Date: 09/04/2018  Lower Venous Study Indications: Edema.  Performing Technologist: Oliver Hum RVT  Examination Guidelines: A complete evaluation includes B-mode imaging, spectral Doppler, color Doppler, and power Doppler as needed of all accessible portions of each vessel. Bilateral testing is considered an integral part of a complete examination. Limited examinations for reoccurring indications may be performed as noted.  Right Venous Findings: +---------+---------------+---------+-----------+----------+-------+          CompressibilityPhasicitySpontaneityPropertiesSummary +---------+---------------+---------+-----------+----------+-------+ CFV      Full           Yes      Yes                          +---------+---------------+---------+-----------+----------+-------+ SFJ      Full                                                  +---------+---------------+---------+-----------+----------+-------+ FV Prox  Full                                                 +---------+---------------+---------+-----------+----------+-------+ FV Mid   Full                                                 +---------+---------------+---------+-----------+----------+-------+ FV DistalFull                                                 +---------+---------------+---------+-----------+----------+-------+ PFV      Full                                                 +---------+---------------+---------+-----------+----------+-------+  POP      Full           Yes      Yes                          +---------+---------------+---------+-----------+----------+-------+ PTV      Full                                                 +---------+---------------+---------+-----------+----------+-------+ PERO     Full                                                 +---------+---------------+---------+-----------+----------+-------+  Left Venous Findings: +---------+---------------+---------+-----------+----------+-------+          CompressibilityPhasicitySpontaneityPropertiesSummary +---------+---------------+---------+-----------+----------+-------+ CFV      Full           Yes      Yes                          +---------+---------------+---------+-----------+----------+-------+ SFJ      Full                                                 +---------+---------------+---------+-----------+----------+-------+ FV Prox  Full                                                 +---------+---------------+---------+-----------+----------+-------+ FV Mid   Full                                                 +---------+---------------+---------+-----------+----------+-------+ FV DistalFull                                                  +---------+---------------+---------+-----------+----------+-------+ PFV      Full                                                 +---------+---------------+---------+-----------+----------+-------+ POP      Full           Yes      Yes                          +---------+---------------+---------+-----------+----------+-------+ PTV      Full                                                 +---------+---------------+---------+-----------+----------+-------+  PERO     Full                                                 +---------+---------------+---------+-----------+----------+-------+    Summary: Right: There is no evidence of deep vein thrombosis in the lower extremity. No cystic structure found in the popliteal fossa. Left: There is no evidence of deep vein thrombosis in the lower extremity. No cystic structure found in the popliteal fossa.  *See table(s) above for measurements and observations. Electronically signed by Sherren Mocha MD on 09/04/2018 at 8:09:27 PM.    Final      Assessment & Plan:   COPD (chronic obstructive pulmonary disease) ([place]) - Start Anoro 1 puff daily  - Albuterol nebulizer every 6 hours for wheezing or shortness of breath   Malignant pleural effusion - Underwent CT guided drain placement on 09/02/18, fluid exudative in nature. Gram stain neg. Chest tube removed  - Repeat CXR showed small left sided pleural effusion   Mass of upper lobe of left lung - CTA on 09/01/18 showed a spiculated 1.9 x 1.6cm anterior left upper lobe nodule suspicious of for primary bronchogenic malignancy. Left-sided loculated pleural effusion. Nodularity in left lung and metastatic adenopathy - Plan brian MRI, PET scan, med/onc follow up on 1/29 - Needs date/time for CT guided needle bx for molecular diagnosis ( Ordered on 1/10, our [place] to call regarding this)  Acute respiratory failure ([place]) - Newly requiring oxygen on hospital discharge - O2 sat 99% 3L [place]  -  Working on getting POC   Rheumatoid arthritis involving multiple sites with positive rheumatoid factor ([place]) - Lab draw requested by rheumatology, faxing results to Dr. Scharlene Gloss office   FU in 6-8 weeks   Martyn Ehrich, NP 09/13/2018

## 2018-09-12 NOTE — Patient Instructions (Addendum)
COPD: Start Anoro 1 puff daily for COPD (take this every day no matter how you feel) Use as needed albuterol nebulizer every 6 hours for wheezing or shortness of breath   Orders: CXR today to evaluate left pleural effusion Labs per rheumatology  Referral: [facility] to set up date for CT guided needle lung biopsy re: left upper lung mass (order already placed under Dr. Lake Bells)  Upcoming apt: MRI 1/17 at 1pm PET scan 1/23 at 1pm Oncology 1/29 at 1pm and 1:30pm   Follow up: Dr. Lake Bells in 6-8 weeks or Eustaquio Maize NP

## 2018-09-13 ENCOUNTER — Telehealth: Payer: Self-pay | Admitting: Family Medicine

## 2018-09-13 ENCOUNTER — Encounter: Payer: Self-pay | Admitting: Primary Care

## 2018-09-13 DIAGNOSIS — Z7951 Long term (current) use of inhaled steroids: Secondary | ICD-10-CM | POA: Diagnosis not present

## 2018-09-13 DIAGNOSIS — J441 Chronic obstructive pulmonary disease with (acute) exacerbation: Secondary | ICD-10-CM | POA: Diagnosis not present

## 2018-09-13 DIAGNOSIS — M0579 Rheumatoid arthritis with rheumatoid factor of multiple sites without organ or systems involvement: Secondary | ICD-10-CM | POA: Diagnosis not present

## 2018-09-13 DIAGNOSIS — Z7952 Long term (current) use of systemic steroids: Secondary | ICD-10-CM | POA: Diagnosis not present

## 2018-09-13 DIAGNOSIS — C3412 Malignant neoplasm of upper lobe, left bronchus or lung: Secondary | ICD-10-CM | POA: Diagnosis not present

## 2018-09-13 DIAGNOSIS — M810 Age-related osteoporosis without current pathological fracture: Secondary | ICD-10-CM | POA: Diagnosis not present

## 2018-09-13 DIAGNOSIS — E785 Hyperlipidemia, unspecified: Secondary | ICD-10-CM | POA: Diagnosis not present

## 2018-09-13 DIAGNOSIS — C771 Secondary and unspecified malignant neoplasm of intrathoracic lymph nodes: Secondary | ICD-10-CM | POA: Diagnosis not present

## 2018-09-13 DIAGNOSIS — Z79899 Other long term (current) drug therapy: Secondary | ICD-10-CM | POA: Diagnosis not present

## 2018-09-13 DIAGNOSIS — I2510 Atherosclerotic heart disease of native coronary artery without angina pectoris: Secondary | ICD-10-CM | POA: Diagnosis not present

## 2018-09-13 DIAGNOSIS — L931 Subacute cutaneous lupus erythematosus: Secondary | ICD-10-CM | POA: Diagnosis not present

## 2018-09-13 DIAGNOSIS — J910 Malignant pleural effusion: Secondary | ICD-10-CM | POA: Diagnosis not present

## 2018-09-13 DIAGNOSIS — G629 Polyneuropathy, unspecified: Secondary | ICD-10-CM | POA: Diagnosis not present

## 2018-09-13 DIAGNOSIS — Z9981 Dependence on supplemental oxygen: Secondary | ICD-10-CM | POA: Diagnosis not present

## 2018-09-13 NOTE — Telephone Encounter (Signed)
Donna Mcneil w/ [facility] was advised to proceed with physical therapy as requested.

## 2018-09-13 NOTE — Progress Notes (Signed)
Reviewed, agree 

## 2018-09-13 NOTE — Assessment & Plan Note (Signed)
-   Lab draw requested by rheumatology, faxing results to Dr. Scharlene Gloss office

## 2018-09-13 NOTE — Telephone Encounter (Signed)
Erline Levine, [place] w/ [place] [phone] - can leave a message  Verbal Orders for in home PT: 2 times a week for 1 week 1 time a week for  2 weeks  Please advise.  Thanks, [employer]

## 2018-09-13 NOTE — Assessment & Plan Note (Addendum)
-   CTA on 09/01/18 showed a spiculated 1.9 x 1.6cm anterior left upper lobe nodule suspicious of for primary bronchogenic malignancy. Left-sided loculated pleural effusion. Nodularity in left lung and metastatic adenopathy - Plan brian MRI, PET scan, med/onc follow up on 1/29 - Needs date/time for CT guided needle bx for molecular diagnosis ( Ordered on 1/10, our [place] to call regarding this)

## 2018-09-13 NOTE — Assessment & Plan Note (Signed)
-   Underwent CT guided drain placement on 09/02/18, fluid exudative in nature. Gram stain neg. Chest tube removed  - Repeat CXR showed small left sided pleural effusion

## 2018-09-13 NOTE — Telephone Encounter (Signed)
Send verbal order to [facility] at [facility] to proceed with PT 2 times a week for 1 week followed by 1 time a week for 2 weeks.

## 2018-09-13 NOTE — Assessment & Plan Note (Signed)
-   Newly requiring oxygen on hospital discharge - O2 sat 99% 3L [place]  - Working on getting POC

## 2018-09-13 NOTE — Assessment & Plan Note (Addendum)
-   Start Anoro 1 puff daily  - Albuterol nebulizer every 6 hours for wheezing or shortness of breath

## 2018-09-14 ENCOUNTER — Telehealth: Payer: Self-pay | Admitting: Family Medicine

## 2018-09-14 ENCOUNTER — Ambulatory Visit (HOSPITAL_COMMUNITY)
Admission: RE | Admit: 2018-09-14 | Discharge: 2018-09-14 | Disposition: A | Discharge status: Home / Self Care | Payer: Medicare Other | Source: Ambulatory Visit | Attending: Internal Medicine | Admitting: Internal Medicine

## 2018-09-14 DIAGNOSIS — C3490 Malignant neoplasm of unspecified part of unspecified bronchus or lung: Secondary | ICD-10-CM | POA: Diagnosis not present

## 2018-09-14 MED ORDER — GADOBUTROL 1 MMOL/ML IV SOLN
7.5000 mL | Freq: Once | INTRAVENOUS | Status: AC | PRN
  Administered 2018-09-14: 7 mL via INTRAVENOUS

## 2018-09-14 NOTE — Telephone Encounter (Signed)
Gave verbal orders to [place].

## 2018-09-14 NOTE — Telephone Encounter (Signed)
Please Review

## 2018-09-14 NOTE — Telephone Encounter (Signed)
[name] w/ [place] [phone]  Needing Verbal Orders for:  Medical Social worker to discuss possible [facility] At Home with patient.  Thanks, [employer]

## 2018-09-14 NOTE — Telephone Encounter (Signed)
Send verbal order to [name] at [facility] for social worker discussion/assessment for at home resources in this patient with COPD, chronic hypoxia with malignant pleural effusion.

## 2018-09-18 ENCOUNTER — Telehealth: Payer: Self-pay | Admitting: Family Medicine

## 2018-09-18 ENCOUNTER — Telehealth: Payer: Self-pay

## 2018-09-18 DIAGNOSIS — M0579 Rheumatoid arthritis with rheumatoid factor of multiple sites without organ or systems involvement: Secondary | ICD-10-CM | POA: Diagnosis not present

## 2018-09-18 DIAGNOSIS — E785 Hyperlipidemia, unspecified: Secondary | ICD-10-CM | POA: Diagnosis not present

## 2018-09-18 DIAGNOSIS — M810 Age-related osteoporosis without current pathological fracture: Secondary | ICD-10-CM | POA: Diagnosis not present

## 2018-09-18 DIAGNOSIS — C771 Secondary and unspecified malignant neoplasm of intrathoracic lymph nodes: Secondary | ICD-10-CM | POA: Diagnosis not present

## 2018-09-18 DIAGNOSIS — G629 Polyneuropathy, unspecified: Secondary | ICD-10-CM | POA: Diagnosis not present

## 2018-09-18 DIAGNOSIS — I2510 Atherosclerotic heart disease of native coronary artery without angina pectoris: Secondary | ICD-10-CM | POA: Diagnosis not present

## 2018-09-18 DIAGNOSIS — Z7952 Long term (current) use of systemic steroids: Secondary | ICD-10-CM | POA: Diagnosis not present

## 2018-09-18 DIAGNOSIS — Z9981 Dependence on supplemental oxygen: Secondary | ICD-10-CM | POA: Diagnosis not present

## 2018-09-18 DIAGNOSIS — Z79899 Other long term (current) drug therapy: Secondary | ICD-10-CM | POA: Diagnosis not present

## 2018-09-18 DIAGNOSIS — J910 Malignant pleural effusion: Secondary | ICD-10-CM | POA: Diagnosis not present

## 2018-09-18 DIAGNOSIS — J441 Chronic obstructive pulmonary disease with (acute) exacerbation: Secondary | ICD-10-CM | POA: Diagnosis not present

## 2018-09-18 DIAGNOSIS — C3412 Malignant neoplasm of upper lobe, left bronchus or lung: Secondary | ICD-10-CM | POA: Diagnosis not present

## 2018-09-18 DIAGNOSIS — Z7951 Long term (current) use of inhaled steroids: Secondary | ICD-10-CM | POA: Diagnosis not present

## 2018-09-18 DIAGNOSIS — L931 Subacute cutaneous lupus erythematosus: Secondary | ICD-10-CM | POA: Diagnosis not present

## 2018-09-18 NOTE — Telephone Encounter (Signed)
Will see what they send. Sent one corrected [place] form for oxygen supplies recently. Wonder if they received that corrected copy or is it still in the process of being scanned into chart before being faxed to them? If they did, they will see it is becoming a messy copy with so many corrections and should be a fresh copy.

## 2018-09-18 NOTE — Telephone Encounter (Signed)
Please Review

## 2018-09-18 NOTE — Telephone Encounter (Signed)
Cynda [place] w/ [place] [phone]  Cord is long with the big tank, pt feels she's not getting the whole 3 liters of oxygen. Asking while pt is on big tank if the oxygen can be bumped to 3 1/2 liters and 3 liters on small tank.  Please advise.  Thanks, [employer]

## 2018-09-18 NOTE — Telephone Encounter (Signed)
Mr. Donna Mcneil calling if you can Resubmit the paper work and change where it says Medically stable change that to yes On the 24 hour caregiver change that to No  Requested to Mr. [name] to fax the paper that he wanted the Provider to make changes to. Fax number provided.

## 2018-09-19 ENCOUNTER — Telehealth: Payer: Self-pay | Admitting: Family Medicine

## 2018-09-19 ENCOUNTER — Telehealth: Payer: Self-pay | Admitting: Medical Oncology

## 2018-09-19 ENCOUNTER — Telehealth: Payer: Self-pay | Admitting: Primary Care

## 2018-09-19 DIAGNOSIS — Z79899 Other long term (current) drug therapy: Secondary | ICD-10-CM | POA: Diagnosis not present

## 2018-09-19 DIAGNOSIS — I2510 Atherosclerotic heart disease of native coronary artery without angina pectoris: Secondary | ICD-10-CM | POA: Diagnosis not present

## 2018-09-19 DIAGNOSIS — J441 Chronic obstructive pulmonary disease with (acute) exacerbation: Secondary | ICD-10-CM | POA: Diagnosis not present

## 2018-09-19 DIAGNOSIS — Z7952 Long term (current) use of systemic steroids: Secondary | ICD-10-CM | POA: Diagnosis not present

## 2018-09-19 DIAGNOSIS — E785 Hyperlipidemia, unspecified: Secondary | ICD-10-CM | POA: Diagnosis not present

## 2018-09-19 DIAGNOSIS — Z7951 Long term (current) use of inhaled steroids: Secondary | ICD-10-CM | POA: Diagnosis not present

## 2018-09-19 DIAGNOSIS — Z9981 Dependence on supplemental oxygen: Secondary | ICD-10-CM | POA: Diagnosis not present

## 2018-09-19 DIAGNOSIS — M810 Age-related osteoporosis without current pathological fracture: Secondary | ICD-10-CM | POA: Diagnosis not present

## 2018-09-19 DIAGNOSIS — M0579 Rheumatoid arthritis with rheumatoid factor of multiple sites without organ or systems involvement: Secondary | ICD-10-CM | POA: Diagnosis not present

## 2018-09-19 DIAGNOSIS — C771 Secondary and unspecified malignant neoplasm of intrathoracic lymph nodes: Secondary | ICD-10-CM | POA: Diagnosis not present

## 2018-09-19 DIAGNOSIS — J910 Malignant pleural effusion: Secondary | ICD-10-CM | POA: Diagnosis not present

## 2018-09-19 DIAGNOSIS — L931 Subacute cutaneous lupus erythematosus: Secondary | ICD-10-CM | POA: Diagnosis not present

## 2018-09-19 DIAGNOSIS — C3412 Malignant neoplasm of upper lobe, left bronchus or lung: Secondary | ICD-10-CM | POA: Diagnosis not present

## 2018-09-19 DIAGNOSIS — G629 Polyneuropathy, unspecified: Secondary | ICD-10-CM | POA: Diagnosis not present

## 2018-09-19 NOTE — Telephone Encounter (Signed)
Requests MRI brain results-pt notified there is " No evidence for intracranial metastatic disease".

## 2018-09-19 NOTE — Telephone Encounter (Signed)
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 Small left pleural effusion. Chronic bronchitic changes. No acute findings

## 2018-09-19 NOTE — Telephone Encounter (Signed)
Donna Mcneil (Caregiver) also wanting call back once the increase in oxygen is approved.    Please call him back at [phone]

## 2018-09-19 NOTE — Telephone Encounter (Signed)
Error

## 2018-09-19 NOTE — Telephone Encounter (Signed)
Advised pt of results. Pt understood and nothing further is needed.   

## 2018-09-19 NOTE — Telephone Encounter (Signed)
Spoke with pt, she would like the MRI results but I informed pt that she would have to get those results from Dr. Delene Ruffini. Pt understood. She then requested her x ray results. Beth please advise.

## 2018-09-20 ENCOUNTER — Ambulatory Visit (HOSPITAL_COMMUNITY)
Admission: RE | Admit: 2018-09-20 | Discharge: 2018-09-20 | Disposition: A | Discharge status: Home / Self Care | Payer: Medicare Other | Source: Ambulatory Visit | Attending: Internal Medicine | Admitting: Internal Medicine

## 2018-09-20 DIAGNOSIS — C3490 Malignant neoplasm of unspecified part of unspecified bronchus or lung: Secondary | ICD-10-CM | POA: Diagnosis not present

## 2018-09-20 LAB — GLUCOSE, CAPILLARY: Glucose-Capillary: 102 mg/dL — ABNORMAL HIGH (ref 70–99)

## 2018-09-20 MED ORDER — FLUDEOXYGLUCOSE F - 18 (FDG) INJECTION
7.2400 | Freq: Once | INTRAVENOUS | Status: AC | PRN
  Administered 2018-09-20: 7.24 via INTRAVENOUS

## 2018-09-20 NOTE — Telephone Encounter (Signed)
Give [facility] verbal authorization to increase oxygen to 3.5 LPM when using the longer tubing at home. If pulse oximetry shows oxygen level continues to drop below 88 on the long tubing, she should get back on the shorter tubing at 3 LPM when ever possible.

## 2018-09-20 NOTE — Telephone Encounter (Signed)
Please Review

## 2018-09-20 NOTE — Telephone Encounter (Signed)
Is this ok to give to them?

## 2018-09-21 ENCOUNTER — Telehealth: Payer: Self-pay | Admitting: *Deleted

## 2018-09-21 DIAGNOSIS — L931 Subacute cutaneous lupus erythematosus: Secondary | ICD-10-CM | POA: Diagnosis not present

## 2018-09-21 DIAGNOSIS — C771 Secondary and unspecified malignant neoplasm of intrathoracic lymph nodes: Secondary | ICD-10-CM | POA: Diagnosis not present

## 2018-09-21 DIAGNOSIS — C3412 Malignant neoplasm of upper lobe, left bronchus or lung: Secondary | ICD-10-CM | POA: Diagnosis not present

## 2018-09-21 DIAGNOSIS — E785 Hyperlipidemia, unspecified: Secondary | ICD-10-CM | POA: Diagnosis not present

## 2018-09-21 DIAGNOSIS — J910 Malignant pleural effusion: Secondary | ICD-10-CM | POA: Diagnosis not present

## 2018-09-21 DIAGNOSIS — I2510 Atherosclerotic heart disease of native coronary artery without angina pectoris: Secondary | ICD-10-CM | POA: Diagnosis not present

## 2018-09-21 DIAGNOSIS — Z7951 Long term (current) use of inhaled steroids: Secondary | ICD-10-CM | POA: Diagnosis not present

## 2018-09-21 DIAGNOSIS — M0579 Rheumatoid arthritis with rheumatoid factor of multiple sites without organ or systems involvement: Secondary | ICD-10-CM | POA: Diagnosis not present

## 2018-09-21 DIAGNOSIS — Z9981 Dependence on supplemental oxygen: Secondary | ICD-10-CM | POA: Diagnosis not present

## 2018-09-21 DIAGNOSIS — Z79899 Other long term (current) drug therapy: Secondary | ICD-10-CM | POA: Diagnosis not present

## 2018-09-21 DIAGNOSIS — M810 Age-related osteoporosis without current pathological fracture: Secondary | ICD-10-CM | POA: Diagnosis not present

## 2018-09-21 DIAGNOSIS — J441 Chronic obstructive pulmonary disease with (acute) exacerbation: Secondary | ICD-10-CM | POA: Diagnosis not present

## 2018-09-21 DIAGNOSIS — G629 Polyneuropathy, unspecified: Secondary | ICD-10-CM | POA: Diagnosis not present

## 2018-09-21 DIAGNOSIS — Z7952 Long term (current) use of systemic steroids: Secondary | ICD-10-CM | POA: Diagnosis not present

## 2018-09-21 NOTE — Telephone Encounter (Signed)
Verbal orders given to [facility] with [place].

## 2018-09-21 NOTE — Telephone Encounter (Signed)
Received an fixed. Faxed form to [facility] at [phone]

## 2018-09-21 NOTE — Telephone Encounter (Signed)
Received TC from pt family member and patient requesting results of PET scan from yesterday. Advised patient and family member that Dr. Julien Nordmann will review the results of the scan with them @ pt's appt on 09/26/18.  Family asking for location of [place]. Directions given.No further questions or concerns

## 2018-09-24 DIAGNOSIS — M810 Age-related osteoporosis without current pathological fracture: Secondary | ICD-10-CM | POA: Diagnosis not present

## 2018-09-24 DIAGNOSIS — J441 Chronic obstructive pulmonary disease with (acute) exacerbation: Secondary | ICD-10-CM | POA: Diagnosis not present

## 2018-09-24 DIAGNOSIS — J910 Malignant pleural effusion: Secondary | ICD-10-CM | POA: Diagnosis not present

## 2018-09-24 DIAGNOSIS — L931 Subacute cutaneous lupus erythematosus: Secondary | ICD-10-CM | POA: Diagnosis not present

## 2018-09-24 DIAGNOSIS — Z7951 Long term (current) use of inhaled steroids: Secondary | ICD-10-CM | POA: Diagnosis not present

## 2018-09-24 DIAGNOSIS — E785 Hyperlipidemia, unspecified: Secondary | ICD-10-CM | POA: Diagnosis not present

## 2018-09-24 DIAGNOSIS — Z9981 Dependence on supplemental oxygen: Secondary | ICD-10-CM | POA: Diagnosis not present

## 2018-09-24 DIAGNOSIS — C771 Secondary and unspecified malignant neoplasm of intrathoracic lymph nodes: Secondary | ICD-10-CM | POA: Diagnosis not present

## 2018-09-24 DIAGNOSIS — I2510 Atherosclerotic heart disease of native coronary artery without angina pectoris: Secondary | ICD-10-CM | POA: Diagnosis not present

## 2018-09-24 DIAGNOSIS — G629 Polyneuropathy, unspecified: Secondary | ICD-10-CM | POA: Diagnosis not present

## 2018-09-24 DIAGNOSIS — M0579 Rheumatoid arthritis with rheumatoid factor of multiple sites without organ or systems involvement: Secondary | ICD-10-CM | POA: Diagnosis not present

## 2018-09-24 DIAGNOSIS — Z7952 Long term (current) use of systemic steroids: Secondary | ICD-10-CM | POA: Diagnosis not present

## 2018-09-24 DIAGNOSIS — Z79899 Other long term (current) drug therapy: Secondary | ICD-10-CM | POA: Diagnosis not present

## 2018-09-24 DIAGNOSIS — C3412 Malignant neoplasm of upper lobe, left bronchus or lung: Secondary | ICD-10-CM | POA: Diagnosis not present

## 2018-09-25 ENCOUNTER — Other Ambulatory Visit: Payer: Self-pay | Admitting: *Deleted

## 2018-09-25 DIAGNOSIS — J441 Chronic obstructive pulmonary disease with (acute) exacerbation: Secondary | ICD-10-CM | POA: Diagnosis not present

## 2018-09-25 DIAGNOSIS — L931 Subacute cutaneous lupus erythematosus: Secondary | ICD-10-CM | POA: Diagnosis not present

## 2018-09-25 DIAGNOSIS — M0579 Rheumatoid arthritis with rheumatoid factor of multiple sites without organ or systems involvement: Secondary | ICD-10-CM | POA: Diagnosis not present

## 2018-09-25 DIAGNOSIS — G629 Polyneuropathy, unspecified: Secondary | ICD-10-CM | POA: Diagnosis not present

## 2018-09-25 DIAGNOSIS — J910 Malignant pleural effusion: Secondary | ICD-10-CM | POA: Diagnosis not present

## 2018-09-25 DIAGNOSIS — M810 Age-related osteoporosis without current pathological fracture: Secondary | ICD-10-CM | POA: Diagnosis not present

## 2018-09-25 DIAGNOSIS — Z79899 Other long term (current) drug therapy: Secondary | ICD-10-CM | POA: Diagnosis not present

## 2018-09-25 DIAGNOSIS — E785 Hyperlipidemia, unspecified: Secondary | ICD-10-CM | POA: Diagnosis not present

## 2018-09-25 DIAGNOSIS — I2510 Atherosclerotic heart disease of native coronary artery without angina pectoris: Secondary | ICD-10-CM | POA: Diagnosis not present

## 2018-09-25 DIAGNOSIS — Z9981 Dependence on supplemental oxygen: Secondary | ICD-10-CM | POA: Diagnosis not present

## 2018-09-25 DIAGNOSIS — Z7952 Long term (current) use of systemic steroids: Secondary | ICD-10-CM | POA: Diagnosis not present

## 2018-09-25 DIAGNOSIS — C771 Secondary and unspecified malignant neoplasm of intrathoracic lymph nodes: Secondary | ICD-10-CM | POA: Diagnosis not present

## 2018-09-25 DIAGNOSIS — C3490 Malignant neoplasm of unspecified part of unspecified bronchus or lung: Secondary | ICD-10-CM

## 2018-09-25 DIAGNOSIS — Z7951 Long term (current) use of inhaled steroids: Secondary | ICD-10-CM | POA: Diagnosis not present

## 2018-09-25 DIAGNOSIS — C3412 Malignant neoplasm of upper lobe, left bronchus or lung: Secondary | ICD-10-CM | POA: Diagnosis not present

## 2018-09-26 ENCOUNTER — Inpatient Hospital Stay: Payer: Medicare Other | Attending: Internal Medicine | Admitting: Internal Medicine

## 2018-09-26 ENCOUNTER — Encounter: Payer: Self-pay | Admitting: Internal Medicine

## 2018-09-26 ENCOUNTER — Other Ambulatory Visit: Payer: Self-pay

## 2018-09-26 ENCOUNTER — Inpatient Hospital Stay: Payer: Medicare Other

## 2018-09-26 DIAGNOSIS — Z9221 Personal history of antineoplastic chemotherapy: Secondary | ICD-10-CM | POA: Diagnosis not present

## 2018-09-26 DIAGNOSIS — C782 Secondary malignant neoplasm of pleura: Secondary | ICD-10-CM

## 2018-09-26 DIAGNOSIS — C3492 Malignant neoplasm of unspecified part of left bronchus or lung: Secondary | ICD-10-CM

## 2018-09-26 DIAGNOSIS — Z79899 Other long term (current) drug therapy: Secondary | ICD-10-CM | POA: Diagnosis not present

## 2018-09-26 DIAGNOSIS — M810 Age-related osteoporosis without current pathological fracture: Secondary | ICD-10-CM | POA: Diagnosis not present

## 2018-09-26 DIAGNOSIS — J439 Emphysema, unspecified: Secondary | ICD-10-CM | POA: Diagnosis not present

## 2018-09-26 DIAGNOSIS — C3412 Malignant neoplasm of upper lobe, left bronchus or lung: Secondary | ICD-10-CM | POA: Insufficient documentation

## 2018-09-26 DIAGNOSIS — Z87891 Personal history of nicotine dependence: Secondary | ICD-10-CM | POA: Insufficient documentation

## 2018-09-26 DIAGNOSIS — I700 Atherosclerosis of aorta: Secondary | ICD-10-CM | POA: Diagnosis not present

## 2018-09-26 DIAGNOSIS — Z7982 Long term (current) use of aspirin: Secondary | ICD-10-CM | POA: Insufficient documentation

## 2018-09-26 DIAGNOSIS — M069 Rheumatoid arthritis, unspecified: Secondary | ICD-10-CM | POA: Diagnosis not present

## 2018-09-26 DIAGNOSIS — I10 Essential (primary) hypertension: Secondary | ICD-10-CM | POA: Diagnosis not present

## 2018-09-26 DIAGNOSIS — C3490 Malignant neoplasm of unspecified part of unspecified bronchus or lung: Secondary | ICD-10-CM

## 2018-09-26 DIAGNOSIS — E7800 Pure hypercholesterolemia, unspecified: Secondary | ICD-10-CM | POA: Diagnosis not present

## 2018-09-26 DIAGNOSIS — M329 Systemic lupus erythematosus, unspecified: Secondary | ICD-10-CM | POA: Diagnosis not present

## 2018-09-26 DIAGNOSIS — M3210 Systemic lupus erythematosus, organ or system involvement unspecified: Secondary | ICD-10-CM | POA: Diagnosis not present

## 2018-09-26 DIAGNOSIS — J90 Pleural effusion, not elsewhere classified: Secondary | ICD-10-CM

## 2018-09-26 DIAGNOSIS — E876 Hypokalemia: Secondary | ICD-10-CM | POA: Insufficient documentation

## 2018-09-26 DIAGNOSIS — Z5111 Encounter for antineoplastic chemotherapy: Secondary | ICD-10-CM | POA: Insufficient documentation

## 2018-09-26 DIAGNOSIS — Z7189 Other specified counseling: Secondary | ICD-10-CM | POA: Insufficient documentation

## 2018-09-26 LAB — CBC WITH DIFFERENTIAL (CANCER CENTER ONLY)
ABS IMMATURE GRANULOCYTES: 0.02 10*3/uL (ref 0.00–0.07)
Basophils Absolute: 0.1 10*3/uL (ref 0.0–0.1)
Basophils Relative: 1 %
Eosinophils Absolute: 0.1 10*3/uL (ref 0.0–0.5)
Eosinophils Relative: 2 %
HCT: 38 % (ref 36.0–46.0)
Hemoglobin: 13 g/dL (ref 12.0–15.0)
Immature Granulocytes: 0 %
Lymphocytes Relative: 23 %
Lymphs Abs: 1.4 10*3/uL (ref 0.7–4.0)
MCH: 30.8 pg (ref 26.0–34.0)
MCHC: 34.2 g/dL (ref 30.0–36.0)
MCV: 90 fL (ref 80.0–100.0)
Monocytes Absolute: 0.6 10*3/uL (ref 0.1–1.0)
Monocytes Relative: 9 %
Neutro Abs: 3.9 10*3/uL (ref 1.7–7.7)
Neutrophils Relative %: 65 %
Platelet Count: 255 10*3/uL (ref 150–400)
RBC: 4.22 MIL/uL (ref 3.87–5.11)
RDW: 14.6 % (ref 11.5–15.5)
WBC Count: 6.1 10*3/uL (ref 4.0–10.5)
nRBC: 0 % (ref 0.0–0.2)

## 2018-09-26 LAB — CMP (CANCER CENTER ONLY)
ALT: 165 U/L — ABNORMAL HIGH (ref 0–44)
AST: 35 U/L (ref 15–41)
Albumin: 2.8 g/dL — ABNORMAL LOW (ref 3.5–5.0)
Alkaline Phosphatase: 151 U/L — ABNORMAL HIGH (ref 38–126)
Anion gap: 13 (ref 5–15)
BUN: 11 mg/dL (ref 8–23)
CO2: 27 mmol/L (ref 22–32)
Calcium: 9.4 mg/dL (ref 8.9–10.3)
Chloride: 97 mmol/L — ABNORMAL LOW (ref 98–111)
Creatinine: 0.72 mg/dL (ref 0.44–1.00)
GFR, Est AFR Am: >60 mL/min (ref >60)
GFR, Estimated: >60 mL/min (ref >60)
Glucose, Bld: 90 mg/dL (ref 70–99)
Potassium: 2.9 mmol/L — CL (ref 3.5–5.1)
Sodium: 137 mmol/L (ref 135–145)
Total Bilirubin: 0.5 mg/dL (ref 0.3–1.2)
Total Protein: 7.1 g/dL (ref 6.5–8.1)

## 2018-09-26 MED ORDER — CYANOCOBALAMIN 1000 MCG/ML IJ SOLN
INTRAMUSCULAR | Status: AC
  Filled 2018-09-26: qty 1

## 2018-09-26 MED ORDER — LIDOCAINE-PRILOCAINE 2.5-2.5 % EX CREA: 1.0000 "application " | TOPICAL_CREAM | CUTANEOUS | 0 refills | Status: DC | PRN

## 2018-09-26 MED ORDER — CYANOCOBALAMIN 1000 MCG/ML IJ SOLN
1000.0000 ug | Freq: Once | INTRAMUSCULAR | Status: AC
  Administered 2018-09-26: 1000 ug via INTRAMUSCULAR

## 2018-09-26 MED ORDER — POTASSIUM CHLORIDE CRYS ER 20 MEQ PO TBCR
EXTENDED_RELEASE_TABLET | ORAL | Status: AC
  Filled 2018-09-26: qty 2

## 2018-09-26 MED ORDER — PROCHLORPERAZINE MALEATE 10 MG PO TABS: 10.0000 mg | ORAL_TABLET | Freq: Four times a day (QID) | ORAL | 0 refills | Status: DC | PRN

## 2018-09-26 MED ORDER — FOLIC ACID 1 MG PO TABS: 1.0000 mg | ORAL_TABLET | Freq: Every day | ORAL | 4 refills | Status: AC

## 2018-09-26 NOTE — Progress Notes (Signed)
[place] [phone]   [phone]  OFFICE PROGRESS NOTE  [name], Donna Mcneil [id number]  DIAGNOSIS: Stage IV (T1b, N2, M1a) non-small cell lung cancer adenocarcinoma  presented with left upper lobe nodule in addition to enlarged left hilar and subcarinal lymphadenopathy as well as loculated malignant left pleural effusion and the pleural metastatic disease diagnosed in January 2020 The patient has a history of lupus as well as rheumatoid arthritis.  She is not a candidate for immunotherapy.  PRIOR THERAPY:None  CURRENT THERAPY: Systemic chemotherapy with carboplatin for AUC of 5, Alimta 500 mg/M2 and a Avastin 15 mg/KG every 3 weeks.  First dose October 09, 2018.  INTERVAL HISTORY: Donna Mcneil 78 y.o. female returns to the clinic today for hospital follow-up visit.  The patient was accompanied by her husband.  The patient is feeling fine today with no concerning complaints except for the baseline shortness of breath increased with exertion.  She is currently on home oxygen.  She denied having any chest pain, cough or hemoptysis.  She denied having any weight loss or night sweats.  She has no nausea, vomiting, diarrhea or constipation.  She had MRI of the brain that showed no evidence for metastatic disease to the brain.  The patient also had a PET scan performed recently and she is here for evaluation and discussion of her treatment options.  MEDICAL HISTORY: Past Medical History:  Diagnosis Date  . COPD exacerbation ([place]) 09/01/2018  . Diverticulitis   . Hypercholesteremia   . Hypertension   . Lupus ([place])   . Osteoporosis   . Tobacco use     ALLERGIES:  is allergic to codeine; cyclosporine; other; pregabalin; and zolpidem.  MEDICATIONS:  Current Outpatient Medications  Medication Sig Dispense Refill  . acetaminophen (TYLENOL) 500 MG tablet Take 1,000 mg by mouth every 6 (six) hours as needed for  mild pain.     Marland Kitchen albuterol (PROVENTIL) (2.5 MG/3ML) 0.083% nebulizer solution USE 1 VIAL IN NEBULIZER AS DIRECTED EVERY 4 HOURS AS NEEDED (Patient taking differently: Take 2.5 mg by nebulization every 4 (four) hours as needed for wheezing or shortness of breath. ) 180 mL 2  . ALPRAZolam (XANAX) 0.5 MG tablet TAKE 1 TABLET BY MOUTH TWICE A DAY AS NEEDED (Patient taking differently: Take 0.5 mg by mouth 2 (two) times daily. ) 60 tablet 0  . amLODipine (NORVASC) 10 MG tablet Take 1 tablet (10 mg total) by mouth daily. 30 tablet 0  . aspirin 81 MG tablet Take 81 mg by mouth daily.    Marland Kitchen atorvastatin (LIPITOR) 40 MG tablet TAKE 1 TABLET BY MOUTH ONCE DAILY (Patient taking differently: Take 40 mg by mouth every evening. ) 90 tablet 3  . b complex vitamins tablet Take 2 tablets by mouth 2 (two) times daily.    . Biotin (BIOTIN 5000) 5 MG CAPS Take 5,000 mg by mouth daily.    . Calcium Carbonate-Vitamin D (CALCIUM 600+D) 600-200 MG-UNIT TABS Take 1 tablet by mouth 2 (two) times daily.     . carboxymethylcellulose (REFRESH TEARS) 0.5 % SOLN Place 1 drop into both eyes daily as needed (dry eyes).     . Coenzyme Q10 (COQ10) 100 MG CAPS Take 100 mg by mouth daily.    . diphenhydrAMINE (BENADRYL) 25 MG tablet Take 25 mg by mouth daily as needed for allergies.    . folic acid (FOLVITE) 563 MCG tablet Take 800 mcg  by mouth at bedtime.     . gabapentin (NEURONTIN) 100 MG capsule Take 100 mg by mouth 3 (three) times daily.     Marland Kitchen guaiFENesin (MUCINEX) 600 MG 12 hr tablet Take 1 tablet (600 mg total) by mouth 2 (two) times daily. 14 tablet 0  . hydrochlorothiazide (HYDRODIURIL) 50 MG tablet TAKE 1 TABLET BY MOUTH ONCE A DAY (Patient taking differently: Take 50 mg by mouth daily. ) 30 tablet 12  . hydroxychloroquine (PLAQUENIL) 200 MG tablet Take 200 mg by mouth daily.     Javier Docker Oil 500 MG CAPS Take 500 mg by mouth daily.    . methotrexate (RHEUMATREX) 2.5 MG tablet Take 15 mg by mouth every Monday.     . Multiple  Vitamins-Minerals (MULTIVITAMIN ADULT PO) Take 1 tablet by mouth daily.     . Omega 3 1000 MG CAPS Take 1,000-2,000 mg by mouth See admin instructions. Take 2000 mg in the morning and 1000 mg at night    . Potassium 99 MG TABS Take 99 mg by mouth daily.     . predniSONE (DELTASONE) 10 MG tablet Take 4 pills daily for 3 days then 3 pills daily for 3 days then 2 pills daily for 3 days then 1 pill daily for 3 days then stop (Patient not taking: Reported on 09/24/2018) 30 tablet 0  . PROAIR HFA 108 (90 Base) MCG/ACT inhaler INHALE 2 PUFFS EVERY 6 HOURS AS NEEDED (Patient taking differently: Inhale 2 puffs into the lungs every 6 (six) hours as needed for wheezing or shortness of breath. ) 8.5 g 3  . senna-docusate (SENOKOT-S) 8.6-50 MG tablet Take 1 tablet by mouth 2 (two) times daily.    Marland Kitchen umeclidinium-vilanterol (ANORO ELLIPTA) 62.5-25 MCG/INH AEPB Inhale 1 puff into the lungs daily. 60 each 3  . Vitamin D, Cholecalciferol, 1000 units TABS Take 1,000 Units by mouth at bedtime.      No current facility-administered medications for this visit.     SURGICAL HISTORY:  Past Surgical History:  Procedure Laterality Date  . ABDOMINAL HYSTERECTOMY  12/1969  . APPENDECTOMY  1957  . CATARACT EXTRACTION    . CHOLECYSTECTOMY  1997  . LAPAROSCOPIC SIGMOID COLECTOMY  2013   secondary to diverticulitis  . spit seed removal    . TONSILLECTOMY  1945    REVIEW OF SYSTEMS:  Constitutional: positive for fatigue Eyes: negative Ears, nose, mouth, throat, and face: negative Respiratory: positive for dyspnea on exertion Cardiovascular: negative Gastrointestinal: negative Genitourinary:negative Integument/breast: negative Hematologic/lymphatic: negative Musculoskeletal:positive for muscle weakness Neurological: negative Behavioral/Psych: negative Endocrine: negative Allergic/Immunologic: negative   PHYSICAL EXAMINATION: General appearance: alert, cooperative, fatigued and no distress Head: Normocephalic,  without obvious abnormality, atraumatic Neck: no adenopathy, no JVD, supple, symmetrical, trachea midline and thyroid not enlarged, symmetric, no tenderness/mass/nodules Lymph nodes: Cervical, supraclavicular, and axillary nodes normal. Resp: diminished breath sounds LLL and dullness to percussion LLL Back: symmetric, no curvature. ROM normal. No CVA tenderness. Cardio: regular rate and rhythm, S1, S2 normal, no murmur, click, rub or gallop GI: soft, non-tender; bowel sounds normal; no masses,  no organomegaly Extremities: extremities normal, atraumatic, no cyanosis or edema Neurologic: Alert and oriented X 3, normal strength and tone. Normal symmetric reflexes. Normal coordination and gait  ECOG PERFORMANCE STATUS: 1 - Symptomatic but completely ambulatory  Blood pressure (!) 149/76, pulse 86, temperature 98.6 F (37 C), temperature source Oral, resp. rate 20, height 5\' 2"  (1.575 m), weight 137 lb (62.1 kg), SpO2 98 %.  LABORATORY DATA:  Lab Results  Component Value Date   WBC 6.1 09/26/2018   HGB 13.0 09/26/2018   HCT 38.0 09/26/2018   MCV 90.0 09/26/2018   PLT 255 09/26/2018      Chemistry      Component Value Date/Time   NA 135 09/05/2018 0400   NA 136 05/14/2018 1225   NA 134 (L) 04/23/2012 0640   K 5.1 09/05/2018 0400   K 2.8 (L) 04/23/2012 0640   CL 100 09/05/2018 0400   CL 91 (L) 04/23/2012 0640   CO2 26 09/05/2018 0400   CO2 32 04/23/2012 0640   BUN 26 (H) 09/05/2018 0400   BUN 8 05/14/2018 1225   BUN 13 04/23/2012 0640   CREATININE 0.64 09/12/2018 1506   CREATININE 0.72 07/04/2017 1045      Component Value Date/Time   CALCIUM 8.5 (L) 09/05/2018 0400   CALCIUM 8.3 (L) 04/23/2012 0640   ALKPHOS 43 09/02/2018 0315   ALKPHOS 54 04/05/2012 0957   AST 21 09/12/2018 1506   AST 31 04/05/2012 0957   ALT 38 (H) 09/12/2018 1506   ALT 23 04/05/2012 0957   BILITOT 1.2 09/02/2018 0315   BILITOT 0.5 05/14/2018 1225   BILITOT 0.4 04/05/2012 0957       RADIOGRAPHIC  STUDIES: Dg Chest 2 View  Result Date: 09/12/2018 CLINICAL DATA:  Fluid build-up. Shortness of breath. Dry cough. Former smoker. History of COPD and chronic bronchitis. Hypertension. EXAM: CHEST - 2 VIEW COMPARISON:  Chest 09/05/2018. CT chest 09/01/2018. FINDINGS: Cardiac enlargement. Increasing interstitial pattern to the lungs may represent developing edema superimposed on chronic fibrosis. Blunting of the left costophrenic angle suggesting a small effusion. Mild fluid in the fissures. Diffuse peribronchial thickening with central interstitial changes consistent with chronic bronchitis. Bilateral apical scarring. Calcified and tortuous aorta. Degenerative changes in the spine. IMPRESSION: Cardiac enlargement with increasing interstitial pattern to the lungs suggesting developing edema superimposed on chronic fibrosis. Small left pleural effusion. Chronic bronchitic changes. Aortic atherosclerosis. Electronically Signed   By: Lucienne Capers M.D.   On: 09/12/2018 22:01   Ct Angio Chest Pe W/cm &/or Wo Cm  Result Date: 09/01/2018 CLINICAL DATA:  Shortness of breath. EXAM: CT ANGIOGRAPHY CHEST WITH CONTRAST TECHNIQUE: Multidetector CT imaging of the chest was performed using the standard protocol during bolus administration of intravenous contrast. Multiplanar CT image reconstructions and MIPs were obtained to evaluate the vascular anatomy. CONTRAST:  52 mL ISOVUE-370 IOPAMIDOL (ISOVUE-370) INJECTION 76% COMPARISON:  Chest radiograph 09/01/2018 FINDINGS: Cardiovascular: There are no filling defects within the pulmonary arteries to suggest pulmonary embolus. Diffuse atherosclerosis of the thoracic aorta without aneurysm or dissection. Conventional branching pattern from the aortic arch. There are coronary artery calcifications. Heart is normal in size. Small pericardial effusion measures up to 10 mm in depth. Mediastinum/Nodes: Prominent left hilar lymph nodes largest measuring up to 13 mm, image 65 series 5.  Prominent subcarinal node measures 12 mm short axis. Small high mediastinal nodes are not enlarged by size criteria. No enlarged right hilar lymph nodes. No axillary adenopathy. Esophagus is decompressed. No visualized thyroid nodule. Lungs/Pleura: Moderate size left pleural effusion is partially loculated. Associated compressive atelectasis in the left lower and to a lesser extent upper lobes. Suspect spiculated 19 x 16 mm anterior left upper lobe nodule image 68 series 6, abutting the pleural surface. Fissural nodularity extends throughout the left interlobar fissure with possible tracking pleural fluid. Dominant fissural nodule measures 9 mm image 35 series 6. Moderate emphysema. There is narrowing of the  left upper and lower lobe bronchi with mucous plugging in the lingula. Additional areas of mucous plugging in the right lower lobe. Right middle lobe pleural based nodule measures 4 mm, image 85 series 6. 5 mm right apical nodule, image 19. Additional tiny subpleural nodules in the right lung. Upper Abdomen: Motion artifact through the abdomen. Left adrenal thickening without dominant nodule. Atherosclerotic plaque at the diaphragmatic hiatus causes luminal narrowing of the aorta of greater than 50%. Musculoskeletal: No blastic or destructive lytic lesions. Review of the MIP images confirms the above findings. IMPRESSION: 1. Suspect spiculated 19 x 16 mm anterior left upper lobe nodule, suspicious for primary bronchogenic malignancy. Fissural nodularity extends throughout the left interlobar fissure. Enlarged left hilar lymph node and prominent subcarinal node. Consider multi disciplinary pulmonary referral for further workup. 2. Moderate size partially loculated left pleural effusion with compressive atelectasis in the left lower and to a lesser extent upper lobes. 3. Small nodules in the right lung, including a 5 mm right apical nodule that is slightly irregular, nonspecific. 4. No pulmonary embolus. 5.  Moderate emphysema. Areas of mucous plugging involves the left upper lobe and right lower lobes. 6. Advanced aortic atherosclerosis. Calcified plaque at the diaphragmatic hiatus causes luminal narrowing of greater than 50%. Aortic Atherosclerosis (ICD10-I70.0) and Emphysema (ICD10-J43.9). Electronically Signed   By: Keith Rake M.D.   On: 09/01/2018 22:40   Mr Jeri Cos FB Contrast  Result Date: 09/14/2018 CLINICAL DATA:  Initial evaluation for non-small cell lung cancer, pretreatment evaluation, staging. EXAM: MRI HEAD WITHOUT AND WITH CONTRAST TECHNIQUE: Multiplanar, multiecho pulse sequences of the brain and surrounding structures were obtained without and with intravenous contrast. CONTRAST:  7 cc of Gadavist. COMPARISON:  Prior neck CT from 02/08/2011. FINDINGS: Brain: Generalized age-related cerebral volume loss. Patchy and confluent T2/FLAIR hyperintensity within the periventricular and deep white matter both cerebral hemispheres, most consistent with chronic small vessel ischemic disease, moderate nature. No abnormal foci of restricted diffusion to suggest acute or subacute ischemia. Gray-white matter differentiation maintained. No encephalomalacia to suggest chronic cortical infarction. No foci of susceptibility artifact to suggest acute or chronic intracranial hemorrhage. No mass lesion, midline shift or mass effect. Ventricles normal in size without evidence for hydrocephalus. No extra-axial fluid collection. No abnormal enhancement. No evidence for metastatic disease. Vascular: Major intracranial vascular flow voids well maintained. Skull and upper cervical spine: Craniocervical junction within normal limits. Upper cervical spine normal. Bone marrow signal intensity within normal limits. No focal marrow replacing lesion. Scalp soft tissues within normal limits. Sinuses/Orbits: Patient status post bilateral ocular lens replacement. Globes and orbital soft tissues demonstrate no acute finding.  Paranasal sinuses are largely clear. Trace opacity within the inferior right mastoid air cells, of doubtful significance. Inner ear structures grossly normal. Other: Ill-defined diffusion abnormality seen overlying the inferior left parotid gland, suspected to be postsurgical in nature related to previously identified parotid mass. IMPRESSION: 1. No evidence for intracranial metastatic disease. 2. Moderate chronic microvascular ischemic disease. Electronically Signed   By: Jeannine Boga M.D.   On: 09/14/2018 15:02   Nm Pet Image Initial (pi) Skull Base To Thigh  Result Date: 09/20/2018 CLINICAL DATA:  Initial treatment strategy for LEFT lung cancer. Malignant effusion. EXAM: NUCLEAR MEDICINE PET SKULL BASE TO THIGH TECHNIQUE: 7.24 mCi F-18 FDG was injected intravenously. Full-ring PET imaging was performed from the skull base to thigh after the radiotracer. CT data was obtained and used for attenuation correction and anatomic localization. Fasting blood glucose: 102 mg/dl COMPARISON:  None. FINDINGS: Mediastinal blood pool activity: SUV max 2.24 NECK: Small nodule in the posterior aspect of LEFT parotid gland SUV max equal 5.0. This nodule measures 9 mm on image 25/4. Incidental CT findings: none CHEST: There is a rind of hypermetabolic activity involving the near entirety of the LEFT pleural space. This activity is most intense at the base and apex. For example hypermetabolic activity of the LEFT apical pleura with SUV max equal 8.2. Along the lateral margin the LEFT upper lobe, anterior to the fissure, there is a discrete hypermetabolic nodule measuring 1.7 cm with intense metabolic activity (SUV max equal 11.1). There is a hypermetabolic LEFT hilar lymph node with SUV max equal 12.3. Additional hypermetabolic subcarinal node which is hypermetabolic with SUV max equal 04.5 No hypermetabolic supraclavicular nodes. No hypermetabolic pulmonary nodules on the RIGHT. Incidental CT findings: Coronary artery  calcification and aortic atherosclerotic calcification. ABDOMEN/PELVIS: No abnormal hypermetabolic activity within the liver, pancreas, adrenal glands, or spleen. No hypermetabolic lymph nodes in the abdomen or pelvis. Low-density collection along the sigmoid colon with peripheral calcifications (image 148/4 ) does not have metabolic activity. Incidental CT findings: none SKELETON: No focal hypermetabolic activity to suggest skeletal metastasis. Incidental CT findings: none IMPRESSION: 1. Hypermetabolic nodule in the lateral aspect of the LEFT upper lobe consists with primary bronchogenic carcinoma. 2. Ipsilateral hilar hypermetabolic nodal metastasis and subcarinal mediastinal nodal metastasis. 3. Diffuse metabolic activity associated with the LEFT pleural surface is most concerning for diffuse pleural metastasis in the LEFT hemithorax. 4. No evidence of metastatic disease outside of the LEFT hemithorax or mediastinum. 5. 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 Consider ENT consultation at some point following lung cancer evaluation / treatment. Electronically Signed   By: Suzy Bouchard M.D.   On: 09/20/2018 15:35   Dg Chest Port 1 View  Result Date: 09/05/2018 CLINICAL DATA:  78 year old female status post left chest tube removal EXAM: PORTABLE CHEST 1 VIEW COMPARISON:  Multiple prior most recent chest x-ray 09/03/2018, CT 09/02/2018 FINDINGS: Cardiomediastinal silhouette unchanged. Chronic interstitial opacities of the lungs, similar to the prior. Unchanged appearance of ill-defined mixed interstitial and airspace opacity at the left lung base. Interval removal of pigtail drainage catheter from the left pleural space with no visualized pneumothorax. Blunting at the left costophrenic angle persists. Aeration on the right is maintained. IMPRESSION: Interval removal of left thoracostomy tube with no  visualized pneumothorax. Similar appearance of mixed interstitial and airspace opacities at the left lung base with likely residual small volume pleural thickening/fluid. Electronically Signed   By: Corrie Mckusick D.O.   On: 09/05/2018 15:15   Dg Chest Port 1 View  Result Date: 09/03/2018 CLINICAL DATA:  Pleural effusion EXAM: PORTABLE CHEST 1 VIEW COMPARISON:  CT chest 09/01/2018 FINDINGS: The lungs are hyperinflated likely secondary to COPD. There is a small left pleural effusion. There is a left pleural drainage catheter present. There is chronic bilateral interstitial thickening. There is no pneumothorax. The heart mediastinum are stable. The osseous structures are unremarkable. IMPRESSION: 1. Small left pleural effusion with a left pleural drainage catheter present. No pneumothorax. Electronically Signed   By: Kathreen Devoid   On: 09/03/2018 16:04   Dg Chest Port 1 View  Result Date: 09/01/2018 CLINICAL DATA:  Short of breath. EXAM: PORTABLE CHEST 1 VIEW COMPARISON:  None. FINDINGS: Cardiac silhouette is partly obscured by left lung base opacity. Is grossly normal in  size. No mediastinal or hilar masses. Opacity at the left lung base is consistent with a moderate pleural effusion with associated atelectasis or pneumonia. Lungs show prominent bronchovascular markings and mild lower lung zone interstitial thickening, presumed chronic. Mild scarring noted at the apices. No convincing right pleural effusion.  No pneumothorax. Skeletal structures are grossly intact. IMPRESSION: 1. Moderate left pleural effusion associated with left lung base atelectasis or pneumonia. 2. No other evidence of acute cardiopulmonary disease. Electronically Signed   By: Lajean Manes M.D.   On: 09/01/2018 18:49   Ct Image Guided Drainage By Percutaneous Catheter  Result Date: 09/02/2018 CLINICAL DATA:  Loculated left pleural effusion and left upper lobe pulmonary nodule. There has been inability to obtain pleural fluid with  attempted bedside thoracentesis. Request has been made to place a pleural drainage catheter. EXAM: CT GUIDED LEFT PLEURAL DRAINAGE CATHETER PLACEMENT ANESTHESIA/SEDATION: 4.0 mg IV Versed 100 mcg IV Fentanyl Total Moderate Sedation Time:  24 minutes The patient's level of consciousness and physiologic status were continuously monitored during the procedure by Radiology nursing. PROCEDURE: The procedure, risks, benefits, and alternatives were explained to the patient. Questions regarding the procedure were encouraged and answered. The patient understands and consents to the procedure. A time out was performed prior to initiating the procedure. CT was performed in a supine position with the left side rolled up. The left lateral chest wall was prepped with chlorhexidine in a sterile fashion, and a sterile drape was applied covering the operative field. A sterile gown and sterile gloves were used for the procedure. Local anesthesia was provided with 1% Lidocaine. Under CT guidance, an 18 gauge trocar needle was advanced into the left pleural space. Fluid was aspirated. A guidewire was advanced in the needle removed. The percutaneous tract was dilated over the guidewire and a 12 French pigtail drainage catheter placed over the wire. Catheter position was confirmed by CT. Additional fluid was aspirated and sent for requested laboratory testing. The catheter was then connected to a [employer] Pleur-evac and wall suction at -20 cm water. COMPLICATIONS: None FINDINGS: Initial aspiration yielded grossly bloody left pleural fluid. After placement of the pigtail pleural drain, there is good return of bloody fluid. A sample of 120 mL of fluid was sent for requested laboratory testing. IMPRESSION: CT-guided placement of left pleural drainage catheter for drainage of a loculated left pleural effusion. There was return of grossly bloody pleural fluid. A 12 French catheter was placed and attached to a Pleur-evac device and wall suction  at -20 cm of water. A sample of fluid was sent for requested laboratory testing. Electronically Signed   By: Aletta Edouard M.D.   On: 09/02/2018 12:50   Vas [place] Lower Extremity Venous (dvt)  Result Date: 09/04/2018  Lower Venous Study Indications: Edema.  Performing Technologist: Oliver Hum RVT  Examination Guidelines: A complete evaluation includes B-mode imaging, spectral Doppler, color Doppler, and power Doppler as needed of all accessible portions of each vessel. Bilateral testing is considered an integral part of a complete examination. Limited examinations for reoccurring indications may be performed as noted.  Right Venous Findings: +---------+---------------+---------+-----------+----------+-------+          CompressibilityPhasicitySpontaneityPropertiesSummary +---------+---------------+---------+-----------+----------+-------+ CFV      Full           Yes      Yes                          +---------+---------------+---------+-----------+----------+-------+ SFJ  Full                                                 +---------+---------------+---------+-----------+----------+-------+ FV Prox  Full                                                 +---------+---------------+---------+-----------+----------+-------+ FV Mid   Full                                                 +---------+---------------+---------+-----------+----------+-------+ FV DistalFull                                                 +---------+---------------+---------+-----------+----------+-------+ PFV      Full                                                 +---------+---------------+---------+-----------+----------+-------+ POP      Full           Yes      Yes                          +---------+---------------+---------+-----------+----------+-------+ PTV      Full                                                  +---------+---------------+---------+-----------+----------+-------+ PERO     Full                                                 +---------+---------------+---------+-----------+----------+-------+  Left Venous Findings: +---------+---------------+---------+-----------+----------+-------+          CompressibilityPhasicitySpontaneityPropertiesSummary +---------+---------------+---------+-----------+----------+-------+ CFV      Full           Yes      Yes                          +---------+---------------+---------+-----------+----------+-------+ SFJ      Full                                                 +---------+---------------+---------+-----------+----------+-------+ FV Prox  Full                                                 +---------+---------------+---------+-----------+----------+-------+  FV Mid   Full                                                 +---------+---------------+---------+-----------+----------+-------+ FV DistalFull                                                 +---------+---------------+---------+-----------+----------+-------+ PFV      Full                                                 +---------+---------------+---------+-----------+----------+-------+ POP      Full           Yes      Yes                          +---------+---------------+---------+-----------+----------+-------+ PTV      Full                                                 +---------+---------------+---------+-----------+----------+-------+ PERO     Full                                                 +---------+---------------+---------+-----------+----------+-------+    Summary: Right: There is no evidence of deep vein thrombosis in the lower extremity. No cystic structure found in the popliteal fossa. Left: There is no evidence of deep vein thrombosis in the lower extremity. No cystic structure found in the popliteal fossa.  *See  table(s) above for measurements and observations. Electronically signed by Sherren Mocha MD on 09/04/2018 at 8:09:27 PM.    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 The patient is a scheduled for CT-guided biopsy of wonderfully pleural-based nodule for further confirmation and characterization of her malignancy. I personally and independently reviewed the PET scan images and discussed the results with the patient today. I had a lengthy discussion with the patient and her husband about her current disease stage, prognosis and treatment options.  The patient also has a history of rheumatoid arthritis as well as lupus.  She will not be a great candidate for treatment with immunotherapy. She understands that she has incurable condition and all the treatment will be of palliative nature. I discussed with the patient the option of palliative care versus palliative systemic chemotherapy with carboplatin for AUC of 5, Alimta 500 mg/M2 and Avastin 15 mg/KG every 3 weeks.  The patient is interested in proceeding with systemic chemotherapy. I discussed with the patient  the adverse effect of this treatment including but not limited to alopecia, myelosuppression, nausea and vomiting, peripheral neuropathy, liver or renal dysfunction in addition to the bleeding issues from Avastin. I will refer the patient to interventional audiology for consideration of Port-A-Cath placement. The patient will receive vitamin B12 injection today. I will send prescription to her pharmacy including folic acid 1 mg p.o. daily, Compazine 10 mg p.o. every 6 hours as needed for nausea in  addition to EMLA cream to be applied to the Port-A-Cath site before treatment. She is expected to start the first cycle of this treatment on October 09, 2018. For the molecular studies, will send blood sample to [facility] today. For the hypokalemia, she is currently on potassium supplement and I advised her to increase her dose by 20 mEq daily for the next 10 days.  She will receive 40 mEq p.o. x1 in the clinic today. The patient will come back for follow-up visit 1 week after her treatment for reevaluation and management of any adverse effect of her treatment. The patient was advised to call immediately if she has any concerning symptoms in the interval. The patient voices understanding of current disease status and treatment options and is in agreement with the current care plan.  All questions were answered. The patient knows to call the clinic with any problems, questions or concerns. We can certainly see the patient much sooner if necessary.  I spent 15 minutes counseling the patient face to face. The total time spent in the appointment was 25 minutes.  Disclaimer: This note was dictated with voice recognition software. Similar sounding words can inadvertently be transcribed and may not be corrected upon review.

## 2018-09-26 NOTE — Progress Notes (Signed)
START ON PATHWAY REGIMEN - Non-Small Cell Lung     A cycle is every 21 days:     Carboplatin      Pemetrexed      Bevacizumab-xxxx   **Always confirm dose/schedule in your pharmacy ordering system**  Patient Characteristics: Stage IV Metastatic, Nonsquamous, Initial Chemotherapy/Immunotherapy, PS = 0, 1, ALK Translocation Negative/Unknown and EGFR Mutation Negative/Non-Sensitizing/Unknown, Not a Candidate for Immunotherapy AJCC T Category: T1b Current Disease Status: Distant Metastases AJCC N Category: N2 AJCC M Category: M1a AJCC 8 Stage Grouping: IVA Histology: Nonsquamous Cell ROS1 Rearrangement Status: Awaiting Test Results T790M Mutation Status: Not Applicable - EGFR Mutation Negative/Unknown Other Mutations/Biomarkers: No Other Actionable Mutations NTRK Gene Fusion Status: Awaiting Test Results PD-L1 Expression Status: Awaiting Test Results Chemotherapy/Immunotherapy LOT: Initial Chemotherapy/Immunotherapy Molecular Targeted Therapy: Not Appropriate ALK Translocation Status: Awaiting Test Results EGFR Mutation Status: Awaiting Test Results BRAF V600E Mutation Status: Awaiting Test Results Performance Status: PS = 0, 1 Immunotherapy Candidate Status: Not a Candidate for Immunotherapy Intent of Therapy: Non-Curative / Palliative Intent, Discussed with Patient

## 2018-09-27 ENCOUNTER — Other Ambulatory Visit: Payer: Self-pay | Admitting: Family Medicine

## 2018-09-28 ENCOUNTER — Other Ambulatory Visit: Payer: Self-pay | Admitting: Radiology

## 2018-09-28 ENCOUNTER — Other Ambulatory Visit: Payer: Self-pay | Admitting: Physician Assistant

## 2018-09-28 ENCOUNTER — Telehealth: Payer: Self-pay

## 2018-09-28 NOTE — Telephone Encounter (Signed)
Spoke with patient concerning her upcoming appointment. Will print in chemo edu. Per 1/31 los

## 2018-10-01 ENCOUNTER — Ambulatory Visit (HOSPITAL_COMMUNITY)
Admission: RE | Admit: 2018-10-01 | Discharge: 2018-10-01 | Disposition: A | Discharge status: Home / Self Care | Payer: Medicare Other | Source: Ambulatory Visit | Attending: Pulmonary Disease | Admitting: Pulmonary Disease

## 2018-10-01 ENCOUNTER — Other Ambulatory Visit: Payer: Self-pay

## 2018-10-01 ENCOUNTER — Encounter (HOSPITAL_COMMUNITY): Payer: Self-pay

## 2018-10-01 ENCOUNTER — Ambulatory Visit (HOSPITAL_COMMUNITY)
Admission: RE | Admit: 2018-10-01 | Discharge: 2018-10-01 | Disposition: A | Discharge status: Home / Self Care | Payer: Medicare Other | Source: Ambulatory Visit | Attending: Interventional Radiology | Admitting: Interventional Radiology

## 2018-10-01 DIAGNOSIS — M810 Age-related osteoporosis without current pathological fracture: Secondary | ICD-10-CM | POA: Diagnosis not present

## 2018-10-01 DIAGNOSIS — J910 Malignant pleural effusion: Secondary | ICD-10-CM | POA: Diagnosis not present

## 2018-10-01 DIAGNOSIS — E7800 Pure hypercholesterolemia, unspecified: Secondary | ICD-10-CM | POA: Diagnosis not present

## 2018-10-01 DIAGNOSIS — C3412 Malignant neoplasm of upper lobe, left bronchus or lung: Secondary | ICD-10-CM | POA: Insufficient documentation

## 2018-10-01 DIAGNOSIS — Z885 Allergy status to narcotic agent status: Secondary | ICD-10-CM | POA: Diagnosis not present

## 2018-10-01 DIAGNOSIS — Z888 Allergy status to other drugs, medicaments and biological substances status: Secondary | ICD-10-CM | POA: Diagnosis not present

## 2018-10-01 DIAGNOSIS — M329 Systemic lupus erythematosus, unspecified: Secondary | ICD-10-CM | POA: Insufficient documentation

## 2018-10-01 DIAGNOSIS — C3491 Malignant neoplasm of unspecified part of right bronchus or lung: Secondary | ICD-10-CM | POA: Diagnosis not present

## 2018-10-01 DIAGNOSIS — J449 Chronic obstructive pulmonary disease, unspecified: Secondary | ICD-10-CM | POA: Diagnosis not present

## 2018-10-01 DIAGNOSIS — Z8379 Family history of other diseases of the digestive system: Secondary | ICD-10-CM | POA: Diagnosis not present

## 2018-10-01 DIAGNOSIS — I10 Essential (primary) hypertension: Secondary | ICD-10-CM | POA: Insufficient documentation

## 2018-10-01 DIAGNOSIS — R911 Solitary pulmonary nodule: Secondary | ICD-10-CM | POA: Diagnosis not present

## 2018-10-01 DIAGNOSIS — Z9889 Other specified postprocedural states: Secondary | ICD-10-CM

## 2018-10-01 DIAGNOSIS — J929 Pleural plaque without asbestos: Secondary | ICD-10-CM | POA: Diagnosis not present

## 2018-10-01 DIAGNOSIS — R918 Other nonspecific abnormal finding of lung field: Secondary | ICD-10-CM

## 2018-10-01 DIAGNOSIS — Z8249 Family history of ischemic heart disease and other diseases of the circulatory system: Secondary | ICD-10-CM | POA: Diagnosis not present

## 2018-10-01 DIAGNOSIS — Z7982 Long term (current) use of aspirin: Secondary | ICD-10-CM | POA: Insufficient documentation

## 2018-10-01 DIAGNOSIS — Z87891 Personal history of nicotine dependence: Secondary | ICD-10-CM | POA: Diagnosis not present

## 2018-10-01 DIAGNOSIS — Z79899 Other long term (current) drug therapy: Secondary | ICD-10-CM | POA: Insufficient documentation

## 2018-10-01 LAB — CBC WITH DIFFERENTIAL/PLATELET
Abs Immature Granulocytes: 0.03 10*3/uL (ref 0.00–0.07)
BASOS PCT: 1 %
Basophils Absolute: 0.1 10*3/uL (ref 0.0–0.1)
Eosinophils Absolute: 0.1 10*3/uL (ref 0.0–0.5)
Eosinophils Relative: 1 %
HCT: 37.2 % (ref 36.0–46.0)
Hemoglobin: 12.4 g/dL (ref 12.0–15.0)
Immature Granulocytes: 0 %
Lymphocytes Relative: 18 %
Lymphs Abs: 1.7 10*3/uL (ref 0.7–4.0)
MCH: 30.2 pg (ref 26.0–34.0)
MCHC: 33.3 g/dL (ref 30.0–36.0)
MCV: 90.7 fL (ref 80.0–100.0)
Monocytes Absolute: 1 10*3/uL (ref 0.1–1.0)
Monocytes Relative: 11 %
Neutro Abs: 6.7 10*3/uL (ref 1.7–7.7)
Neutrophils Relative %: 69 %
Platelets: 326 10*3/uL (ref 150–400)
RBC: 4.1 MIL/uL (ref 3.87–5.11)
RDW: 14.8 % (ref 11.5–15.5)
WBC: 9.6 10*3/uL (ref 4.0–10.5)
nRBC: 0 % (ref 0.0–0.2)

## 2018-10-01 LAB — PROTIME-INR
INR: 1.01
Prothrombin Time: 13.2 seconds (ref 11.4–15.2)

## 2018-10-01 LAB — BASIC METABOLIC PANEL
Anion gap: 15 (ref 5–15)
BUN: 14 mg/dL (ref 8–23)
CO2: 24 mmol/L (ref 22–32)
Calcium: 8.6 mg/dL — ABNORMAL LOW (ref 8.9–10.3)
Chloride: 97 mmol/L — ABNORMAL LOW (ref 98–111)
Creatinine, Ser: 0.69 mg/dL (ref 0.44–1.00)
GFR calc Af Amer: >60 mL/min (ref >60)
GFR calc non Af Amer: >60 mL/min (ref >60)
GLUCOSE: 102 mg/dL — AB (ref 70–99)
Potassium: 4.7 mmol/L (ref 3.5–5.1)
Sodium: 136 mmol/L (ref 135–145)

## 2018-10-01 MED ORDER — FENTANYL CITRATE (PF) 100 MCG/2ML IJ SOLN
INTRAMUSCULAR | Status: AC | PRN
  Administered 2018-10-01: 50 ug via INTRAVENOUS

## 2018-10-01 MED ORDER — FENTANYL CITRATE (PF) 100 MCG/2ML IJ SOLN
INTRAMUSCULAR | Status: AC
  Filled 2018-10-01: qty 2

## 2018-10-01 MED ORDER — LIDOCAINE-EPINEPHRINE 1 %-1:100000 IJ SOLN
INTRAMUSCULAR | Status: AC
  Filled 2018-10-01: qty 1

## 2018-10-01 MED ORDER — SODIUM CHLORIDE 0.9 % IV SOLN
INTRAVENOUS | Status: DC
  Administered 2018-10-01: 09:00:00 via INTRAVENOUS

## 2018-10-01 MED ORDER — MIDAZOLAM HCL 2 MG/2ML IJ SOLN
INTRAMUSCULAR | Status: AC | PRN
  Administered 2018-10-01 (×2): 1 mg via INTRAVENOUS

## 2018-10-01 MED ORDER — SODIUM CHLORIDE 0.9 % IV SOLN
INTRAVENOUS | Status: AC | PRN
  Administered 2018-10-01: 10 mL/h via INTRAVENOUS

## 2018-10-01 MED ORDER — MIDAZOLAM HCL 2 MG/2ML IJ SOLN
INTRAMUSCULAR | Status: AC
  Filled 2018-10-01: qty 2

## 2018-10-01 NOTE — Procedures (Signed)
Pre procedural Dx: Hypermetabolic lung nodule biopsy  Post procedural Dx: Same  Technically successful CT guided biopsy of indeterminate hypermetabolic nodule within the peripheral lateral aspect of the left upper lobe.  EBL: None.   Complications: None immediate.   Ronny Bacon, MD Pager #: [phone]

## 2018-10-01 NOTE — Progress Notes (Signed)
Pt cxr post lung biopsy normal. Dr Pascal Lux was called because HR goes to 180 bpm intervals. Dr Pascal Lux states she was this way during the case and is probably same as home baseline. Pt is very short of breath on 3 L [place]. But states this is how she is at home, no changes. Explained to her how high pulse goes up to and she way want to discuss with pcp or ER if worsens. Pt verbalized understanding.

## 2018-10-01 NOTE — Discharge Instructions (Signed)
Lung Biopsy, Care After °This sheet gives you information about how to care for yourself after your procedure. Your health care provider may also give you more specific instructions depending on the type of biopsy you had. If you have problems or questions, contact your health care provider. °What can I expect after the procedure? °After the procedure, it is common to have: °· A cough. °· A sore throat. °· Pain where a needle, bronchoscope, or incision was used to collect a biopsy sample (biopsy site). °Follow these instructions at home: °Medicines ° °· Take over-the-counter and prescription medicines only as told by your health care provider. °· Do not drive for 24 hours if you were given a sedative. °· Do not drink alcohol while taking pain medicine. °· Do not drive or use heavy machinery while taking prescription pain medicine. °· To prevent or treat constipation while you are taking prescription pain medicine, your health care provider may recommend that you: °? Drink enough fluid to keep your urine clear or pale yellow. °? Take over-the-counter or prescription medicines. °? Eat foods that are high in fiber, such as fresh fruits and vegetables, whole grains, and beans. °? Limit foods that are high in fat and processed sugars, such as fried and sweet foods. °Activity °· If you had an incision during your procedure, avoid activities that may pull the incision site open. °· Return to your normal activities as told by your health care provider. Ask your health care provider what activities are safe for you. °If you had an open biopsy:  °· Follow instructions from your health care provider about how to take care of your incision. Make sure you: °? Wash your hands with soap and water before you change your bandage (dressing). If soap and water are not available, use hand sanitizer. °? Change your dressing as told by your health care provider. °? Leave stitches (sutures), skin glue, or adhesive strips in place. These  skin closures may need to stay in place for 2 weeks or longer. If adhesive strip edges start to loosen and curl up, you may trim the loose edges. Do not remove adhesive strips completely unless your health care provider tells you to do that. °· Check your incision area every day for signs of infection. Check for: °? Redness, swelling, or pain. °? Fluid or blood. °? Warmth. °? Pus or a bad smell. °General instructions °· It is up to you to get the results of your procedure. Ask your health care provider, or the department that is doing the procedure, when your results will be ready. °Contact a health care provider if: °· You have a fever. °· You have redness, swelling, or pain around your biopsy site. °· You have fluid or blood coming from your biopsy site. °· Your biopsy site feels warm to the touch. °· You have pus or a bad smell coming from your biopsy site. °Get help right away if: °· You cough up blood. °· You have trouble breathing. °· You have chest pain. °Summary °· After the procedure, it is common to have a sore throat and a cough. °· Return to your normal activities as told by your health care provider. Ask your health care provider what activities are safe for you. °· Take over-the-counter and prescription medicines only as told by your health care provider. °· Report any unusual symptoms to your health care provider. °This information is not intended to replace advice given to you by your health care provider. Make sure   you discuss any questions you have with your health care provider. °Document Released: 09/13/2016 Document Revised: 09/13/2016 Document Reviewed: 09/13/2016 °[employer] Interactive Patient Education © 2019 [employer] ° °

## 2018-10-01 NOTE — H&P (Signed)
Chief Complaint: Patient was seen in consultation today for left chest wall mass biopsy at the request of Cal[place] B  Referring Physician(s): McQuaid,Douglas B  Supervising Physician: Sandi Mariscal  Patient Status: [facility] - Out-pt  History of Present Illness: Donna Mcneil is a 78 y.o. female   Smoker; SOB/COPD Left malignant effusion Chest tube drain placed 09/02/18 Cyto:  PLEURAL FLUID, LEFT (SPECIMEN 1 OF 1 COLLECTED 09/02/2018) ATYPICAL CELLS SUSPICIOUS FOR ADENOCARCINOMA.  09/20/2018:  IMPRESSION: 1. Hypermetabolic nodule in the lateral aspect of the LEFT upper lobe consists with primary bronchogenic carcinoma. 2. Ipsilateral hilar hypermetabolic nodal metastasis and subcarinal mediastinal nodal metastasis. 3. Diffuse metabolic activity associated with the LEFT pleural surface is most concerning for diffuse pleural metastasis in the LEFT hemithorax. 4. No evidence of metastatic disease outside of the LEFT hemithorax or mediastinum. 5. 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 Consider ENT consultation at some point following lung cancer evaluation / treatment.  Referred to Dr Julien Nordmann Scheduled for left pleural based chest wall lesion biopsy Need for molecular studies   Past Medical History:  Diagnosis Date  . COPD exacerbation ([place]) 09/01/2018  . Diverticulitis   . Hypercholesteremia   . Hypertension   . Lupus ([place])   . Osteoporosis   . Tobacco use     Past Surgical History:  Procedure Laterality Date  . ABDOMINAL HYSTERECTOMY  12/1969  . APPENDECTOMY  1957  . CATARACT EXTRACTION    . CHOLECYSTECTOMY  1997  . LAPAROSCOPIC SIGMOID COLECTOMY  2013   secondary to diverticulitis  . spit seed removal    . TONSILLECTOMY  1945    Allergies: Codeine; Cyclosporine; Other; Pregabalin; and Zolpidem  Medications: Prior to Admission medications    Medication Sig Start Date End Date Taking? Authorizing Provider  acetaminophen (TYLENOL) 500 MG tablet Take 1,000 mg by mouth every 6 (six) hours as needed for mild pain.    Yes [provider]  albuterol (PROVENTIL) (2.5 MG/3ML) 0.083% nebulizer solution USE 1 VIAL IN NEBULIZER AS DIRECTED EVERY 4 HOURS AS NEEDED Patient taking differently: Take 2.5 mg by nebulization every 4 (four) hours as needed for wheezing or shortness of breath.  07/23/18  Yes [name], [name], PA  ALPRAZolam (XANAX) 0.5 MG tablet TAKE 1 TABLET BY MOUTH TWICE A DAY AS NEEDED Patient taking differently: Take 0.5 mg by mouth 2 (two) times daily.  08/20/18  Yes [name], [name], PA  amLODipine (NORVASC) 10 MG tablet Take 1 tablet (10 mg total) by mouth daily. 09/07/18  Yes Shelly Coss, MD  aspirin 81 MG tablet Take 81 mg by mouth daily.   Yes [provider]  atorvastatin (LIPITOR) 40 MG tablet TAKE 1 TABLET BY MOUTH ONCE DAILY Patient taking differently: Take 40 mg by mouth every evening.  05/14/18  Yes [name], [name], PA  b complex vitamins tablet Take 2 tablets by mouth 2 (two) times daily.   Yes [provider]  Biotin (BIOTIN 5000) 5 MG CAPS Take 5,000 mg by mouth daily.   Yes [provider]  Calcium Carbonate-Vitamin D (CALCIUM 600+D) 600-200 MG-UNIT TABS Take 1 tablet by mouth 2 (two) times daily.    Yes [provider]  carboxymethylcellulose (REFRESH TEARS) 0.5 % SOLN Place 1 drop into both eyes daily as needed (dry eyes).    Yes [provider]  Coenzyme Q10 (COQ10) 100 MG CAPS Take 100  mg by mouth daily.   Yes [provider]  diphenhydrAMINE (BENADRYL) 25 MG tablet Take 25 mg by mouth daily as needed for allergies.   Yes [provider]  folic acid (FOLVITE) 1 MG tablet Take 1 tablet (1 mg total) by mouth daily. 09/26/18  Yes Curt Bears, MD  gabapentin (NEURONTIN) 100 MG capsule Take 100 mg by mouth 3 (three) times daily.   09/08/15  Yes [provider]  guaiFENesin (MUCINEX) 600 MG 12 hr tablet Take 1 tablet (600 mg total) by mouth 2 (two) times daily. 09/06/18  Yes Shelly Coss, MD  hydrochlorothiazide (HYDRODIURIL) 50 MG tablet TAKE 1 TABLET BY MOUTH ONCE A DAY Patient taking differently: Take 50 mg by mouth daily.  10/20/17  Yes [name], [name], PA  hydroxychloroquine (PLAQUENIL) 200 MG tablet Take 200 mg by mouth daily.  11/07/17 11/07/18 Yes [provider]  Javier Docker Oil 500 MG CAPS Take 500 mg by mouth daily.   Yes [provider]  methotrexate (RHEUMATREX) 2.5 MG tablet Take 15 mg by mouth every Monday.  10/01/15  Yes [provider]  Multiple Vitamins-Minerals (MULTIVITAMIN ADULT PO) Take 1 tablet by mouth daily.    Yes [provider]  Omega 3 1000 MG CAPS Take 1,000-2,000 mg by mouth See admin instructions. Take 2000 mg in the morning and 1000 mg at night   Yes [provider]  Potassium 99 MG TABS Take 99 mg by mouth daily.    Yes [provider]  PROAIR HFA 108 (90 Base) MCG/ACT inhaler Inhale 2 puffs into the lungs every 6 (six) hours as needed for wheezing or shortness of breath. 09/27/18  Yes [name], [name], PA  senna-docusate (SENOKOT-S) 8.6-50 MG tablet Take 1 tablet by mouth 2 (two) times daily.   Yes [provider]  umeclidinium-vilanterol (ANORO ELLIPTA) 62.5-25 MCG/INH AEPB Inhale 1 puff into the lungs daily. 09/12/18  Yes Martyn Ehrich, NP  Vitamin D, Cholecalciferol, 1000 units TABS Take 1,000 Units by mouth at bedtime.    Yes [provider]  lidocaine-prilocaine (EMLA) cream Apply 1 application topically as needed. 09/26/18   Curt Bears, MD  predniSONE (DELTASONE) 10 MG tablet Take 4 pills daily for 3 days then 3 pills daily for 3 days then 2 pills daily for 3 days then 1 pill daily for 3 days then stop 09/06/18   Shelly Coss, MD  prochlorperazine (COMPAZINE) 10 MG tablet Take 1 tablet (10 mg total) by  mouth every 6 (six) hours as needed for nausea or vomiting. 09/26/18   Curt Bears, MD     Family History  Problem Relation Age of Onset  . Alcohol abuse Mother   . Cirrhosis Mother   . Heart disease Father   . Hypertension Father   . Gout Father   . Ulcers Father   . Hyperlipidemia Father   . Alcohol abuse Brother   . Heart disease Brother        MI at age 83  . OCD Son   . Prostatitis Paternal Grandfather   . Pneumonia Paternal Grandfather   . Cancer Son     Social History   Socioeconomic History  . Marital status: Divorced    Spouse name: Not on file  . Number of children: 2  . Years of education: Not on file  . Highest education level: High school graduate  Occupational History  . Occupation: retired  Scientific laboratory technician  . Financial resource strain: Not hard at all  .  Food insecurity:    Worry: Never true    Inability: Never true  . Transportation needs:    Medical: No    Non-medical: No  Tobacco Use  . Smoking status: Former Smoker    Packs/day: 0.50    Types: Cigarettes    Last attempt to quit: 09/01/2018    Years since quitting: 0.0  . Smokeless tobacco: Never Used  . Tobacco comment: started smoking at age 14  Substance and Sexual Activity  . Alcohol use: No    Alcohol/week: 0.0 standard drinks  . Drug use: No  . Sexual activity: Not Currently  Lifestyle  . Physical activity:    Days per week: Not on file    Minutes per session: Not on file  . Stress: To some extent  Relationships  . Social connections:    Talks on phone: Not on file    Gets together: Not on file    Attends religious service: Not on file    Active member of club or organization: Not on file    Attends meetings of clubs or organizations: Not on file    Relationship status: Not on file  Other Topics Concern  . Not on file  Social History Narrative  . Not on file    Review of Systems: A 12 point ROS discussed and pertinent positives are indicated in the HPI above.  All other  systems are negative.  Review of Systems  Constitutional: Positive for activity change and fatigue. Negative for appetite change, fever and unexpected weight change.  HENT: Negative for trouble swallowing.   Respiratory: Positive for cough, shortness of breath and wheezing.   Cardiovascular: Negative for chest pain.  Musculoskeletal: Positive for gait problem.  Neurological: Positive for weakness.  Psychiatric/Behavioral: Negative for behavioral problems and confusion.    Vital Signs: BP 138/67   Pulse 89   Temp 98 F (36.7 C) (Oral)   Resp 20   Ht 5\' 2"  (1.575 m)   Wt 137 lb (62.1 kg)   SpO2 95%   BMI 25.06 kg/m   Physical Exam Vitals signs reviewed.  Cardiovascular:     Rate and Rhythm: Normal rate and regular rhythm.  Pulmonary:     Breath sounds: Wheezing and rales present.  Abdominal:     General: Bowel sounds are normal.     Palpations: Abdomen is soft.  Musculoskeletal: Normal range of motion.  Skin:    General: Skin is warm and dry.  Neurological:     General: No focal deficit present.     Mental Status: She is alert and oriented to person, place, and time.  Psychiatric:        Mood and Affect: Mood normal.        Behavior: Behavior normal.        Thought Content: Thought content normal.        Judgment: Judgment normal.     Imaging: Dg Chest 2 View  Result Date: 09/12/2018 CLINICAL DATA:  Fluid build-up. Shortness of breath. Dry cough. Former smoker. History of COPD and chronic bronchitis. Hypertension. EXAM: CHEST - 2 VIEW COMPARISON:  Chest 09/05/2018. CT chest 09/01/2018. FINDINGS: Cardiac enlargement. Increasing interstitial pattern to the lungs may represent developing edema superimposed on chronic fibrosis. Blunting of the left costophrenic angle suggesting a small effusion. Mild fluid in the fissures. Diffuse peribronchial thickening with central interstitial changes consistent with chronic bronchitis. Bilateral apical scarring. Calcified and tortuous  aorta. Degenerative changes in the spine. IMPRESSION: Cardiac enlargement with  increasing interstitial pattern to the lungs suggesting developing edema superimposed on chronic fibrosis. Small left pleural effusion. Chronic bronchitic changes. Aortic atherosclerosis. Electronically Signed   By: Lucienne Capers M.D.   On: 09/12/2018 22:01   Ct Angio Chest Pe W/cm &/or Wo Cm  Result Date: 09/01/2018 CLINICAL DATA:  Shortness of breath. EXAM: CT ANGIOGRAPHY CHEST WITH CONTRAST TECHNIQUE: Multidetector CT imaging of the chest was performed using the standard protocol during bolus administration of intravenous contrast. Multiplanar CT image reconstructions and MIPs were obtained to evaluate the vascular anatomy. CONTRAST:  52 mL ISOVUE-370 IOPAMIDOL (ISOVUE-370) INJECTION 76% COMPARISON:  Chest radiograph 09/01/2018 FINDINGS: Cardiovascular: There are no filling defects within the pulmonary arteries to suggest pulmonary embolus. Diffuse atherosclerosis of the thoracic aorta without aneurysm or dissection. Conventional branching pattern from the aortic arch. There are coronary artery calcifications. Heart is normal in size. Small pericardial effusion measures up to 10 mm in depth. Mediastinum/Nodes: Prominent left hilar lymph nodes largest measuring up to 13 mm, image 65 series 5. Prominent subcarinal node measures 12 mm short axis. Small high mediastinal nodes are not enlarged by size criteria. No enlarged right hilar lymph nodes. No axillary adenopathy. Esophagus is decompressed. No visualized thyroid nodule. Lungs/Pleura: Moderate size left pleural effusion is partially loculated. Associated compressive atelectasis in the left lower and to a lesser extent upper lobes. Suspect spiculated 19 x 16 mm anterior left upper lobe nodule image 68 series 6, abutting the pleural surface. Fissural nodularity extends throughout the left interlobar fissure with possible tracking pleural fluid. Dominant fissural nodule measures 9  mm image 35 series 6. Moderate emphysema. There is narrowing of the left upper and lower lobe bronchi with mucous plugging in the lingula. Additional areas of mucous plugging in the right lower lobe. Right middle lobe pleural based nodule measures 4 mm, image 85 series 6. 5 mm right apical nodule, image 19. Additional tiny subpleural nodules in the right lung. Upper Abdomen: Motion artifact through the abdomen. Left adrenal thickening without dominant nodule. Atherosclerotic plaque at the diaphragmatic hiatus causes luminal narrowing of the aorta of greater than 50%. Musculoskeletal: No blastic or destructive lytic lesions. Review of the MIP images confirms the above findings. IMPRESSION: 1. Suspect spiculated 19 x 16 mm anterior left upper lobe nodule, suspicious for primary bronchogenic malignancy. Fissural nodularity extends throughout the left interlobar fissure. Enlarged left hilar lymph node and prominent subcarinal node. Consider multi disciplinary pulmonary referral for further workup. 2. Moderate size partially loculated left pleural effusion with compressive atelectasis in the left lower and to a lesser extent upper lobes. 3. Small nodules in the right lung, including a 5 mm right apical nodule that is slightly irregular, nonspecific. 4. No pulmonary embolus. 5. Moderate emphysema. Areas of mucous plugging involves the left upper lobe and right lower lobes. 6. Advanced aortic atherosclerosis. Calcified plaque at the diaphragmatic hiatus causes luminal narrowing of greater than 50%. Aortic Atherosclerosis (ICD10-I70.0) and Emphysema (ICD10-J43.9). Electronically Signed   By: Keith Rake M.D.   On: 09/01/2018 22:40   Mr Jeri Cos TG Contrast  Result Date: 09/14/2018 CLINICAL DATA:  Initial evaluation for non-small cell lung cancer, pretreatment evaluation, staging. EXAM: MRI HEAD WITHOUT AND WITH CONTRAST TECHNIQUE: Multiplanar, multiecho pulse sequences of the brain and surrounding structures were  obtained without and with intravenous contrast. CONTRAST:  7 cc of Gadavist. COMPARISON:  Prior neck CT from 02/08/2011. FINDINGS: Brain: Generalized age-related cerebral volume loss. Patchy and confluent T2/FLAIR hyperintensity within the periventricular and deep white  matter both cerebral hemispheres, most consistent with chronic small vessel ischemic disease, moderate nature. No abnormal foci of restricted diffusion to suggest acute or subacute ischemia. Gray-white matter differentiation maintained. No encephalomalacia to suggest chronic cortical infarction. No foci of susceptibility artifact to suggest acute or chronic intracranial hemorrhage. No mass lesion, midline shift or mass effect. Ventricles normal in size without evidence for hydrocephalus. No extra-axial fluid collection. No abnormal enhancement. No evidence for metastatic disease. Vascular: Major intracranial vascular flow voids well maintained. Skull and upper cervical spine: Craniocervical junction within normal limits. Upper cervical spine normal. Bone marrow signal intensity within normal limits. No focal marrow replacing lesion. Scalp soft tissues within normal limits. Sinuses/Orbits: Patient status post bilateral ocular lens replacement. Globes and orbital soft tissues demonstrate no acute finding. Paranasal sinuses are largely clear. Trace opacity within the inferior right mastoid air cells, of doubtful significance. Inner ear structures grossly normal. Other: Ill-defined diffusion abnormality seen overlying the inferior left parotid gland, suspected to be postsurgical in nature related to previously identified parotid mass. IMPRESSION: 1. No evidence for intracranial metastatic disease. 2. Moderate chronic microvascular ischemic disease. Electronically Signed   By: Jeannine Boga M.D.   On: 09/14/2018 15:02   Nm Pet Image Initial (pi) Skull Base To Thigh  Result Date: 09/20/2018 CLINICAL DATA:  Initial treatment strategy for LEFT  lung cancer. Malignant effusion. EXAM: NUCLEAR MEDICINE PET SKULL BASE TO THIGH TECHNIQUE: 7.24 mCi F-18 FDG was injected intravenously. Full-ring PET imaging was performed from the skull base to thigh after the radiotracer. CT data was obtained and used for attenuation correction and anatomic localization. Fasting blood glucose: 102 mg/dl COMPARISON:  None. FINDINGS: Mediastinal blood pool activity: SUV max 2.24 NECK: Small nodule in the posterior aspect of LEFT parotid gland SUV max equal 5.0. This nodule measures 9 mm on image 25/4. Incidental CT findings: none CHEST: There is a rind of hypermetabolic activity involving the near entirety of the LEFT pleural space. This activity is most intense at the base and apex. For example hypermetabolic activity of the LEFT apical pleura with SUV max equal 8.2. Along the lateral margin the LEFT upper lobe, anterior to the fissure, there is a discrete hypermetabolic nodule measuring 1.7 cm with intense metabolic activity (SUV max equal 11.1). There is a hypermetabolic LEFT hilar lymph node with SUV max equal 12.3. Additional hypermetabolic subcarinal node which is hypermetabolic with SUV max equal 74.2 No hypermetabolic supraclavicular nodes. No hypermetabolic pulmonary nodules on the RIGHT. Incidental CT findings: Coronary artery calcification and aortic atherosclerotic calcification. ABDOMEN/PELVIS: No abnormal hypermetabolic activity within the liver, pancreas, adrenal glands, or spleen. No hypermetabolic lymph nodes in the abdomen or pelvis. Low-density collection along the sigmoid colon with peripheral calcifications (image 148/4 ) does not have metabolic activity. Incidental CT findings: none SKELETON: No focal hypermetabolic activity to suggest skeletal metastasis. Incidental CT findings: none IMPRESSION: 1. Hypermetabolic nodule in the lateral aspect of the LEFT upper lobe consists with primary bronchogenic carcinoma. 2. Ipsilateral hilar hypermetabolic nodal  metastasis and subcarinal mediastinal nodal metastasis. 3. Diffuse metabolic activity associated with the LEFT pleural surface is most concerning for diffuse pleural metastasis in the LEFT hemithorax. 4. No evidence of metastatic disease outside of the LEFT hemithorax or mediastinum. 5. 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 Consider ENT consultation at some point following lung cancer evaluation / treatment. Electronically Signed   By: Suzy Bouchard  M.D.   On: 09/20/2018 15:35   Dg Chest Port 1 View  Result Date: 09/05/2018 CLINICAL DATA:  78 year old female status post left chest tube removal EXAM: PORTABLE CHEST 1 VIEW COMPARISON:  Multiple prior most recent chest x-ray 09/03/2018, CT 09/02/2018 FINDINGS: Cardiomediastinal silhouette unchanged. Chronic interstitial opacities of the lungs, similar to the prior. Unchanged appearance of ill-defined mixed interstitial and airspace opacity at the left lung base. Interval removal of pigtail drainage catheter from the left pleural space with no visualized pneumothorax. Blunting at the left costophrenic angle persists. Aeration on the right is maintained. IMPRESSION: Interval removal of left thoracostomy tube with no visualized pneumothorax. Similar appearance of mixed interstitial and airspace opacities at the left lung base with likely residual small volume pleural thickening/fluid. Electronically Signed   By: Corrie Mckusick D.O.   On: 09/05/2018 15:15   Dg Chest Port 1 View  Result Date: 09/03/2018 CLINICAL DATA:  Pleural effusion EXAM: PORTABLE CHEST 1 VIEW COMPARISON:  CT chest 09/01/2018 FINDINGS: The lungs are hyperinflated likely secondary to COPD. There is a small left pleural effusion. There is a left pleural drainage catheter present. There is chronic bilateral interstitial thickening. There is no pneumothorax. The heart mediastinum are  stable. The osseous structures are unremarkable. IMPRESSION: 1. Small left pleural effusion with a left pleural drainage catheter present. No pneumothorax. Electronically Signed   By: Kathreen Devoid   On: 09/03/2018 16:04   Dg Chest Port 1 View  Result Date: 09/01/2018 CLINICAL DATA:  Short of breath. EXAM: PORTABLE CHEST 1 VIEW COMPARISON:  None. FINDINGS: Cardiac silhouette is partly obscured by left lung base opacity. Is grossly normal in size. No mediastinal or hilar masses. Opacity at the left lung base is consistent with a moderate pleural effusion with associated atelectasis or pneumonia. Lungs show prominent bronchovascular markings and mild lower lung zone interstitial thickening, presumed chronic. Mild scarring noted at the apices. No convincing right pleural effusion.  No pneumothorax. Skeletal structures are grossly intact. IMPRESSION: 1. Moderate left pleural effusion associated with left lung base atelectasis or pneumonia. 2. No other evidence of acute cardiopulmonary disease. Electronically Signed   By: Lajean Manes M.D.   On: 09/01/2018 18:49   Ct Image Guided Drainage By Percutaneous Catheter  Result Date: 09/02/2018 CLINICAL DATA:  Loculated left pleural effusion and left upper lobe pulmonary nodule. There has been inability to obtain pleural fluid with attempted bedside thoracentesis. Request has been made to place a pleural drainage catheter. EXAM: CT GUIDED LEFT PLEURAL DRAINAGE CATHETER PLACEMENT ANESTHESIA/SEDATION: 4.0 mg IV Versed 100 mcg IV Fentanyl Total Moderate Sedation Time:  24 minutes The patient's level of consciousness and physiologic status were continuously monitored during the procedure by Radiology nursing. PROCEDURE: The procedure, risks, benefits, and alternatives were explained to the patient. Questions regarding the procedure were encouraged and answered. The patient understands and consents to the procedure. A time out was performed prior to initiating the procedure.  CT was performed in a supine position with the left side rolled up. The left lateral chest wall was prepped with chlorhexidine in a sterile fashion, and a sterile drape was applied covering the operative field. A sterile gown and sterile gloves were used for the procedure. Local anesthesia was provided with 1% Lidocaine. Under CT guidance, an 18 gauge trocar needle was advanced into the left pleural space. Fluid was aspirated. A guidewire was advanced in the needle removed. The percutaneous tract was dilated over the guidewire and a 12 French pigtail drainage catheter  placed over the wire. Catheter position was confirmed by CT. Additional fluid was aspirated and sent for requested laboratory testing. The catheter was then connected to a [employer]ara Pleur-evac and wall suction at -20 cm water. COMPLICATIONS: None FINDINGS: Initial aspiration yielded grossly bloody left pleural fluid. After placement of the pigtail pleural drain, there is good return of bloody fluid. A sample of 120 mL of fluid was sent for requested laboratory testing. IMPRESSION: CT-guided placement of left pleural drainage catheter for drainage of a loculated left pleural effusion. There was return of grossly bloody pleural fluid. A 12 French catheter was placed and attached to a Pleur-evac device and wall suction at -20 cm of water. A sample of fluid was sent for requested laboratory testing. Electronically Signed   By: Aletta Edouard M.D.   On: 09/02/2018 12:50   Vas [place] Lower Extremity Venous (dvt)  Result Date: 09/04/2018  Lower Venous Study Indications: Edema.  Performing Technologist: Oliver Hum RVT  Examination Guidelines: A complete evaluation includes B-mode imaging, spectral Doppler, color Doppler, and power Doppler as needed of all accessible portions of each vessel. Bilateral testing is considered an integral part of a complete examination. Limited examinations for reoccurring indications may be performed as noted.  Right Venous  Findings: +---------+---------------+---------+-----------+----------+-------+          CompressibilityPhasicitySpontaneityPropertiesSummary +---------+---------------+---------+-----------+----------+-------+ CFV      Full           Yes      Yes                          +---------+---------------+---------+-----------+----------+-------+ SFJ      Full                                                 +---------+---------------+---------+-----------+----------+-------+ FV Prox  Full                                                 +---------+---------------+---------+-----------+----------+-------+ FV Mid   Full                                                 +---------+---------------+---------+-----------+----------+-------+ FV DistalFull                                                 +---------+---------------+---------+-----------+----------+-------+ PFV      Full                                                 +---------+---------------+---------+-----------+----------+-------+ POP      Full           Yes      Yes                          +---------+---------------+---------+-----------+----------+-------+ PTV  Full                                                 +---------+---------------+---------+-----------+----------+-------+ PERO     Full                                                 +---------+---------------+---------+-----------+----------+-------+  Left Venous Findings: +---------+---------------+---------+-----------+----------+-------+          CompressibilityPhasicitySpontaneityPropertiesSummary +---------+---------------+---------+-----------+----------+-------+ CFV      Full           Yes      Yes                          +---------+---------------+---------+-----------+----------+-------+ SFJ      Full                                                  +---------+---------------+---------+-----------+----------+-------+ FV Prox  Full                                                 +---------+---------------+---------+-----------+----------+-------+ FV Mid   Full                                                 +---------+---------------+---------+-----------+----------+-------+ FV DistalFull                                                 +---------+---------------+---------+-----------+----------+-------+ PFV      Full                                                 +---------+---------------+---------+-----------+----------+-------+ POP      Full           Yes      Yes                          +---------+---------------+---------+-----------+----------+-------+ PTV      Full                                                 +---------+---------------+---------+-----------+----------+-------+ PERO     Full                                                 +---------+---------------+---------+-----------+----------+-------+  Summary: Right: There is no evidence of deep vein thrombosis in the lower extremity. No cystic structure found in the popliteal fossa. Left: There is no evidence of deep vein thrombosis in the lower extremity. No cystic structure found in the popliteal fossa.  *See table(s) above for measurements and observations. Electronically signed by Sherren Mocha MD on 09/04/2018 at 8:09:27 PM.    Final     Labs:  CBC: Recent Labs    09/05/18 0400 09/12/18 1506 09/26/18 1312 10/01/18 0849  WBC 11.1* 10.4 6.1 9.6  HGB 13.0 13.5 13.0 12.4  HCT 38.8 39.6 38.0 37.2  PLT 326 167.0 Result may be falsely decreased due to platelet clumping. 255 326    COAGS: Recent Labs    09/02/18 0031  INR 0.98  APTT 33    BMP: Recent Labs    09/03/18 0345 09/05/18 0400 09/12/18 1506 09/26/18 1312 10/01/18 0849  NA 132* 135  --  137 136  K 4.0 5.1  --  2.9* 4.7  CL 99 100  --  97* 97*  CO2 23 26   --  27 24  GLUCOSE 128* 132*  --  90 102*  BUN 20 26*  --  11 14  CALCIUM 8.3* 8.5*  --  9.4 8.6*  CREATININE 0.67 0.71 0.64 0.72 0.69  GFRNONAA >60 >60  --  >60 >60  GFRAA >60 >60  --  >60 >60    LIVER FUNCTION TESTS: Recent Labs    01/09/18 1105 05/14/18 1225 09/02/18 0315 09/02/18 1013 09/12/18 1506 09/26/18 1312  BILITOT 0.4 0.5 1.2  --   --  0.5  AST 21 23 17   --  21 35  ALT 21 24 18   --  38* 165*  ALKPHOS 57 57 43  --   --  151*  PROT 7.3 7.5 6.8 7.2  --  7.1  ALBUMIN 4.5 4.5 2.9* 3.0* 3.7 2.8*    TUMOR MARKERS: No results for input(s): AFPTM, CEA, CA199, CHROMGRNA in the last 8760 hours.  Assessment and Plan:  Left lung lesion Left chest wall lesion Malignant left pleural effusion - Chest tube drain placed 09/02/18 Need for molecular studies Scheduled now for left chest wall mass biopsy Risks and benefits discussed with the patient including, but not limited to bleeding, hemoptysis, respiratory failure requiring intubation, infection, pneumothorax requiring chest tube placement, stroke from air embolism or even death.  All of the patient's questions were answered, patient is agreeable to proceed. Consent signed and in chart.   Thank you for this interesting consult.  I greatly enjoyed meeting CARLISA EBLE and look forward to participating in their care.  A copy of this report was sent to the requesting provider on this date.  Electronically Signed: Lavonia Drafts, PA-C 10/01/2018, 9:39 AM   I spent a total of  30 Minutes   in face to face in clinical consultation, greater than 50% of which was counseling/coordinating care for left chest wall mass biopsy

## 2018-10-02 ENCOUNTER — Inpatient Hospital Stay: Payer: Medicare Other | Attending: Internal Medicine

## 2018-10-02 ENCOUNTER — Other Ambulatory Visit (HOSPITAL_COMMUNITY): Payer: Self-pay | Admitting: Family Medicine

## 2018-10-02 ENCOUNTER — Telehealth: Payer: Self-pay | Admitting: Primary Care

## 2018-10-02 DIAGNOSIS — M069 Rheumatoid arthritis, unspecified: Secondary | ICD-10-CM | POA: Insufficient documentation

## 2018-10-02 DIAGNOSIS — I700 Atherosclerosis of aorta: Secondary | ICD-10-CM | POA: Insufficient documentation

## 2018-10-02 DIAGNOSIS — J449 Chronic obstructive pulmonary disease, unspecified: Secondary | ICD-10-CM | POA: Insufficient documentation

## 2018-10-02 DIAGNOSIS — Z79899 Other long term (current) drug therapy: Secondary | ICD-10-CM | POA: Insufficient documentation

## 2018-10-02 DIAGNOSIS — M329 Systemic lupus erythematosus, unspecified: Secondary | ICD-10-CM | POA: Insufficient documentation

## 2018-10-02 DIAGNOSIS — J910 Malignant pleural effusion: Secondary | ICD-10-CM | POA: Insufficient documentation

## 2018-10-02 DIAGNOSIS — C782 Secondary malignant neoplasm of pleura: Secondary | ICD-10-CM | POA: Insufficient documentation

## 2018-10-02 DIAGNOSIS — R110 Nausea: Secondary | ICD-10-CM | POA: Insufficient documentation

## 2018-10-02 DIAGNOSIS — I10 Essential (primary) hypertension: Secondary | ICD-10-CM | POA: Insufficient documentation

## 2018-10-02 DIAGNOSIS — Z5112 Encounter for antineoplastic immunotherapy: Secondary | ICD-10-CM | POA: Insufficient documentation

## 2018-10-02 DIAGNOSIS — C771 Secondary and unspecified malignant neoplasm of intrathoracic lymph nodes: Secondary | ICD-10-CM | POA: Insufficient documentation

## 2018-10-02 DIAGNOSIS — E7800 Pure hypercholesterolemia, unspecified: Secondary | ICD-10-CM | POA: Insufficient documentation

## 2018-10-02 DIAGNOSIS — C3412 Malignant neoplasm of upper lobe, left bronchus or lung: Secondary | ICD-10-CM | POA: Insufficient documentation

## 2018-10-02 DIAGNOSIS — M810 Age-related osteoporosis without current pathological fracture: Secondary | ICD-10-CM | POA: Insufficient documentation

## 2018-10-02 DIAGNOSIS — F17210 Nicotine dependence, cigarettes, uncomplicated: Secondary | ICD-10-CM | POA: Insufficient documentation

## 2018-10-02 DIAGNOSIS — Z5111 Encounter for antineoplastic chemotherapy: Secondary | ICD-10-CM | POA: Insufficient documentation

## 2018-10-02 DIAGNOSIS — Z7982 Long term (current) use of aspirin: Secondary | ICD-10-CM | POA: Insufficient documentation

## 2018-10-02 NOTE — Telephone Encounter (Signed)
I called Donna Mcneil to let her know that her biopsy showed adenocarcinoma.  She voiced understanding.

## 2018-10-02 NOTE — Telephone Encounter (Signed)
Called and spoke with patient regarding her request for POC. Patient feels house bound with her O2, it is too much of a hassle to tote the large [employer] back and forth when leaving her home, it is too much. She is requesting POC. Advised her that she will need ov with NP or BQ and have qualifying walk on a POC here in office. Pt verbalized understanding, had no further questions Scheduled appt with EW for 10/03/2018 at 10am Nothing further needed.

## 2018-10-03 ENCOUNTER — Ambulatory Visit (INDEPENDENT_AMBULATORY_CARE_PROVIDER_SITE_OTHER): Payer: Medicare Other | Admitting: Primary Care

## 2018-10-03 ENCOUNTER — Encounter: Payer: Self-pay | Admitting: Primary Care

## 2018-10-03 VITALS — BP 138/68 | HR 79 | Ht 62.0 in | Wt 138.0 lb

## 2018-10-03 DIAGNOSIS — C3492 Malignant neoplasm of unspecified part of left bronchus or lung: Secondary | ICD-10-CM | POA: Diagnosis not present

## 2018-10-03 DIAGNOSIS — J9611 Chronic respiratory failure with hypoxia: Secondary | ICD-10-CM

## 2018-10-03 NOTE — Progress Notes (Signed)
Reviewed, agree 

## 2018-10-03 NOTE — Assessment & Plan Note (Signed)
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 Julien Nordmann, next visit 10/17/18

## 2018-10-03 NOTE — Progress Notes (Signed)
@Patient  ID: Donna Mcneil, female    DOB: [birth year], 78 y.o.   MRN: [id number]  Chief Complaint  Patient presents with  . Follow-up    Pt requests POC eval.     Referring provider: [name], PA  HPI: 78 year old female, former smoker quit Jan 2020. PMH significant for RA (on methotrexate), lupus, COPD, left upper lung mass, malignant pleural effusion. New patient to [facility], seen in-patient. Recent hospital admission for acute respiratory failure secondary to left sided pleural effusion, COPD. 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 Following with Dr. Julien Nordmann. Plans on starting systemic chemotherapy (carboplatin for AUC of 5, Alimta 500 mg/M2 and Avastin 15 mg/KG) every 3 weeks.  Hospital course 1/4-1/9: Presented with complaints of shortness of breath and flu-like symptoms x 1 week. Failed outpatient antibiotic therapy with azithromycin. Long history of smoking. CTA in ED showed a spiculated 1.9 x 1.6cm anterior left upper lobe nodule suspicious of for primary bronchogenic malignancy. There was also moderate sized partially loculated left pleural effusion. On 1/5 the patient underwent CT guided placement of left pleural catheter for drainage of loculated pleural effusion. 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 Oncology felt cytology sample was not adequate for molecular diagnosis. Needs CT guided needle biopsy outpatient. She has been set up with appointments for MRI brain, PET scan and follow-up with med/onc.   09/13/2018 Patient presents today for hospital follow-up. Accompanied by female care giver. Feels her breathing is better, still has baseline shortness of breath on exertion and dry cough. She is not on a  maintenance regimen for COPD. She has been using Albuterol nebulizer every 4 hours and rescue inhaler as needed 2-3 times a day. She has two days left of prednisone taper. She has been set up with appointments for MRI brain, PET scan and follow-up with med/onc. Still needs date/time for CT guided needle biopsy regarding new LUL mass for molecular diagnosis.   10/03/2018 Patient presents today to qualify for portable oxygen canister (POC), states that she feels house bound with her oxygen tank and that it is too hard to carry around the large E tanks. Still using Anoro 1 puff daily, feels it has been helping a great deal. States that she has only been requiring Albuterol nebulizer 1-2 times a day. Denies cough or wheezing. Saw oncology last week, diagnosed with stage IV non-small cell lung cancer. Patient elected to precede with palliative systemic chemotherapy every three weeks. Planning for port-a-cath placement on 2/18.   Allergies  Allergen Reactions  . Codeine Other (See Comments)    GI upset  . Cyclosporine Other (See Comments)    Burning eyes  . Other     Other reaction(s): OTHER  . Pregabalin Other (See Comments)    Personality changes-becomes angry  . Zolpidem     Other reaction(s): ITCHING    Immunization History  Administered Date(s) Administered  . Influenza Split 08/15/2014  . Influenza, High Dose Seasonal PF 07/04/2017, 05/14/2018  . Influenza, Seasonal, Injecte, Preservative Fre 07/12/2010, 07/04/2011  . Influenza-Unspecified 07/17/2015, 07/02/2016  . Pneumococcal Conjugate-13 07/04/2017  . Pneumococcal Polysaccharide-23 07/12/2010, 07/17/2015    Past Medical History:  Diagnosis Date  . COPD exacerbation ([place]) 09/01/2018  . Diverticulitis   . Hypercholesteremia   . Hypertension   .  Lupus ([place])   . Osteoporosis   . Tobacco use     Tobacco History: Social History   Tobacco Use  Smoking Status Former Smoker  . Packs/day: 0.50  . Types: Cigarettes  . Last attempt  to quit: 09/01/2018  . Years since quitting: 0.0  Smokeless Tobacco Never Used  Tobacco Comment   started smoking at age 29   Counseling given: Not Answered Comment: started smoking at age 70   Outpatient Medications Prior to Visit  Medication Sig Dispense Refill  . acetaminophen (TYLENOL) 500 MG tablet Take 1,000 mg by mouth every 6 (six) hours as needed for mild pain.     Marland Kitchen albuterol (PROVENTIL) (2.5 MG/3ML) 0.083% nebulizer solution USE 1 VIAL IN NEBULIZER AS DIRECTED EVERY 4 HOURS AS NEEDED (Patient taking differently: Take 2.5 mg by nebulization every 4 (four) hours as needed for wheezing or shortness of breath. ) 180 mL 2  . ALPRAZolam (XANAX) 0.5 MG tablet TAKE 1 TABLET BY MOUTH TWICE A DAY AS NEEDED (Patient taking differently: Take 0.5 mg by mouth 2 (two) times daily. ) 60 tablet 0  . amLODipine (NORVASC) 10 MG tablet TAKE 1 TABLET BY MOUTH ONCE DAILY 30 tablet 6  . aspirin 81 MG tablet Take 81 mg by mouth daily.    Marland Kitchen atorvastatin (LIPITOR) 40 MG tablet TAKE 1 TABLET BY MOUTH ONCE DAILY (Patient taking differently: Take 40 mg by mouth every evening. ) 90 tablet 3  . b complex vitamins tablet Take 2 tablets by mouth 2 (two) times daily.    . Biotin (BIOTIN 5000) 5 MG CAPS Take 5,000 mg by mouth daily.    . Calcium Carbonate-Vitamin D (CALCIUM 600+D) 600-200 MG-UNIT TABS Take 1 tablet by mouth 2 (two) times daily.     . carboxymethylcellulose (REFRESH TEARS) 0.5 % SOLN Place 1 drop into both eyes daily as needed (dry eyes).     . Coenzyme Q10 (COQ10) 100 MG CAPS Take 100 mg by mouth daily.    . diphenhydrAMINE (BENADRYL) 25 MG tablet Take 25 mg by mouth daily as needed for allergies.    . folic acid (FOLVITE) 1 MG tablet Take 1 tablet (1 mg total) by mouth daily. 30 tablet 4  . gabapentin (NEURONTIN) 100 MG capsule Take 100 mg by mouth 3 (three) times daily.     Marland Kitchen guaiFENesin (MUCINEX) 600 MG 12 hr tablet Take 1 tablet (600 mg total) by mouth 2 (two) times daily. 14 tablet 0  .  hydrochlorothiazide (HYDRODIURIL) 50 MG tablet TAKE 1 TABLET BY MOUTH ONCE A DAY (Patient taking differently: Take 50 mg by mouth daily. ) 30 tablet 12  . hydroxychloroquine (PLAQUENIL) 200 MG tablet Take 200 mg by mouth daily.     Javier Docker Oil 500 MG CAPS Take 500 mg by mouth daily.    Marland Kitchen lidocaine-prilocaine (EMLA) cream Apply 1 application topically as needed. 30 g 0  . methotrexate (RHEUMATREX) 2.5 MG tablet Take 15 mg by mouth every Monday.     . Multiple Vitamins-Minerals (MULTIVITAMIN ADULT PO) Take 1 tablet by mouth daily.     . Omega 3 1000 MG CAPS Take 1,000-2,000 mg by mouth See admin instructions. Take 2000 mg in the morning and 1000 mg at night    . Potassium 99 MG TABS Take 99 mg by mouth daily.     . predniSONE (DELTASONE) 10 MG tablet Take 4 pills daily for 3 days then 3 pills daily for 3 days then 2 pills daily  for 3 days then 1 pill daily for 3 days then stop 30 tablet 0  . PROAIR HFA 108 (90 Base) MCG/ACT inhaler Inhale 2 puffs into the lungs every 6 (six) hours as needed for wheezing or shortness of breath. 3 Inhaler 2  . prochlorperazine (COMPAZINE) 10 MG tablet Take 1 tablet (10 mg total) by mouth every 6 (six) hours as needed for nausea or vomiting. 30 tablet 0  . senna-docusate (SENOKOT-S) 8.6-50 MG tablet Take 1 tablet by mouth 2 (two) times daily.    Marland Kitchen umeclidinium-vilanterol (ANORO ELLIPTA) 62.5-25 MCG/INH AEPB Inhale 1 puff into the lungs daily. 60 each 3  . Vitamin D, Cholecalciferol, 1000 units TABS Take 1,000 Units by mouth at bedtime.      No facility-administered medications prior to visit.     Review of Systems  Review of Systems  Constitutional: Negative.   HENT: Negative.        Nasal congestion, dry membranes  Respiratory: Negative for cough, shortness of breath and wheezing.   Cardiovascular: Negative.     Physical Exam  BP 138/68 (BP Location: Left Arm, Cuff Size: Normal)   Pulse 79   Ht 5\' 2"  (1.575 m)   Wt 138 lb (62.6 kg)   SpO2 95%   BMI  25.24 kg/m  Physical Exam Constitutional:      General: She is not in acute distress.    Appearance: Normal appearance.  HENT:     Head: Normocephalic and atraumatic.     Mouth/Throat:     Mouth: Mucous membranes are moist.     Pharynx: Oropharynx is clear.  Eyes:     Extraocular Movements: Extraocular movements intact.     Pupils: Pupils are equal, round, and reactive to light.  Neck:     Musculoskeletal: Normal range of motion and neck supple.  Cardiovascular:     Rate and Rhythm: Normal rate and regular rhythm.     Comments: Regular rhythm  Pulmonary:     Effort: Pulmonary effort is normal.     Breath sounds: Rhonchi present.  Skin:    General: Skin is warm and dry.  Neurological:     General: No focal deficit present.     Mental Status: She is alert. Mental status is at baseline.  Psychiatric:        Mood and Affect: Mood normal.        Behavior: Behavior normal.        Thought Content: Thought content normal.        Judgment: Judgment normal.      Lab Results:  CBC    Component Value Date/Time   WBC 9.6 10/01/2018 0849   RBC 4.10 10/01/2018 0849   HGB 12.4 10/01/2018 0849   HGB 13.0 09/26/2018 1312   HGB 15.1 05/14/2018 1225   HCT 37.2 10/01/2018 0849   HCT 41.0 05/14/2018 1225   PLT 326 10/01/2018 0849   PLT 255 09/26/2018 1312   PLT 224 05/14/2018 1225   MCV 90.7 10/01/2018 0849   MCV 91 05/14/2018 1225   MCV 93 04/23/2012 0640   MCH 30.2 10/01/2018 0849   MCHC 33.3 10/01/2018 0849   RDW 14.8 10/01/2018 0849   RDW 13.8 05/14/2018 1225   RDW 14.6 (H) 04/23/2012 0640   LYMPHSABS 1.7 10/01/2018 0849   LYMPHSABS 1.9 05/14/2018 1225   LYMPHSABS 0.5 (L) 04/23/2012 0640   MONOABS 1.0 10/01/2018 0849   MONOABS 0.7 04/23/2012 0640   EOSABS 0.1 10/01/2018 0849   EOSABS  0.1 05/14/2018 1225   EOSABS 0.0 04/23/2012 0640   BASOSABS 0.1 10/01/2018 0849   BASOSABS 0.1 05/14/2018 1225   BASOSABS 0.0 04/23/2012 0640    BMET    Component Value Date/Time    NA 136 10/01/2018 0849   NA 136 05/14/2018 1225   NA 134 (L) 04/23/2012 0640   K 4.7 10/01/2018 0849   K 2.8 (L) 04/23/2012 0640   CL 97 (L) 10/01/2018 0849   CL 91 (L) 04/23/2012 0640   CO2 24 10/01/2018 0849   CO2 32 04/23/2012 0640   GLUCOSE 102 (H) 10/01/2018 0849   GLUCOSE 117 (H) 04/23/2012 0640   BUN 14 10/01/2018 0849   BUN 8 05/14/2018 1225   BUN 13 04/23/2012 0640   CREATININE 0.69 10/01/2018 0849   CREATININE 0.72 09/26/2018 1312   CREATININE 0.72 07/04/2017 1045   CALCIUM 8.6 (L) 10/01/2018 0849   CALCIUM 8.3 (L) 04/23/2012 0640   GFRNONAA >60 10/01/2018 0849   GFRNONAA >60 09/26/2018 1312   GFRNONAA 81 07/04/2017 1045   GFRAA >60 10/01/2018 0849   GFRAA >60 09/26/2018 1312   GFRAA 94 07/04/2017 1045    BNP    Component Value Date/Time   BNP 266.0 (H) 09/01/2018 1818    ProBNP No results found for: PROBNP  Imaging: Dg Chest 2 View  Result Date: 09/12/2018 CLINICAL DATA:  Fluid build-up. Shortness of breath. Dry cough. Former smoker. History of COPD and chronic bronchitis. Hypertension. EXAM: CHEST - 2 VIEW COMPARISON:  Chest 09/05/2018. CT chest 09/01/2018. FINDINGS: Cardiac enlargement. Increasing interstitial pattern to the lungs may represent developing edema superimposed on chronic fibrosis. Blunting of the left costophrenic angle suggesting a small effusion. Mild fluid in the fissures. Diffuse peribronchial thickening with central interstitial changes consistent with chronic bronchitis. Bilateral apical scarring. Calcified and tortuous aorta. Degenerative changes in the spine. IMPRESSION: Cardiac enlargement with increasing interstitial pattern to the lungs suggesting developing edema superimposed on chronic fibrosis. Small left pleural effusion. Chronic bronchitic changes. Aortic atherosclerosis. Electronically Signed   By: Lucienne Capers M.D.   On: 09/12/2018 22:01   Mr Jeri Cos KC Contrast  Result Date: 09/14/2018 CLINICAL DATA:  Initial evaluation for  non-small cell lung cancer, pretreatment evaluation, staging. EXAM: MRI HEAD WITHOUT AND WITH CONTRAST TECHNIQUE: Multiplanar, multiecho pulse sequences of the brain and surrounding structures were obtained without and with intravenous contrast. CONTRAST:  7 cc of Gadavist. COMPARISON:  Prior neck CT from 02/08/2011. FINDINGS: Brain: Generalized age-related cerebral volume loss. Patchy and confluent T2/FLAIR hyperintensity within the periventricular and deep white matter both cerebral hemispheres, most consistent with chronic small vessel ischemic disease, moderate nature. No abnormal foci of restricted diffusion to suggest acute or subacute ischemia. Gray-white matter differentiation maintained. No encephalomalacia to suggest chronic cortical infarction. No foci of susceptibility artifact to suggest acute or chronic intracranial hemorrhage. No mass lesion, midline shift or mass effect. Ventricles normal in size without evidence for hydrocephalus. No extra-axial fluid collection. No abnormal enhancement. No evidence for metastatic disease. Vascular: Major intracranial vascular flow voids well maintained. Skull and upper cervical spine: Craniocervical junction within normal limits. Upper cervical spine normal. Bone marrow signal intensity within normal limits. No focal marrow replacing lesion. Scalp soft tissues within normal limits. Sinuses/Orbits: Patient status post bilateral ocular lens replacement. Globes and orbital soft tissues demonstrate no acute finding. Paranasal sinuses are largely clear. Trace opacity within the inferior right mastoid air cells, of doubtful significance. Inner ear structures grossly normal. Other: Ill-defined diffusion abnormality seen  overlying the inferior left parotid gland, suspected to be postsurgical in nature related to previously identified parotid mass. IMPRESSION: 1. No evidence for intracranial metastatic disease. 2. Moderate chronic microvascular ischemic disease.  Electronically Signed   By: Jeannine Boga M.D.   On: 09/14/2018 15:02   Nm Pet Image Initial (pi) Skull Base To Thigh  Result Date: 09/20/2018 CLINICAL DATA:  Initial treatment strategy for LEFT lung cancer. Malignant effusion. EXAM: NUCLEAR MEDICINE PET SKULL BASE TO THIGH TECHNIQUE: 7.24 mCi F-18 FDG was injected intravenously. Full-ring PET imaging was performed from the skull base to thigh after the radiotracer. CT data was obtained and used for attenuation correction and anatomic localization. Fasting blood glucose: 102 mg/dl COMPARISON:  None. FINDINGS: Mediastinal blood pool activity: SUV max 2.24 NECK: Small nodule in the posterior aspect of LEFT parotid gland SUV max equal 5.0. This nodule measures 9 mm on image 25/4. Incidental CT findings: none CHEST: There is a rind of hypermetabolic activity involving the near entirety of the LEFT pleural space. This activity is most intense at the base and apex. For example hypermetabolic activity of the LEFT apical pleura with SUV max equal 8.2. Along the lateral margin the LEFT upper lobe, anterior to the fissure, there is a discrete hypermetabolic nodule measuring 1.7 cm with intense metabolic activity (SUV max equal 11.1). There is a hypermetabolic LEFT hilar lymph node with SUV max equal 12.3. Additional hypermetabolic subcarinal node which is hypermetabolic with SUV max equal 54.6 No hypermetabolic supraclavicular nodes. No hypermetabolic pulmonary nodules on the RIGHT. Incidental CT findings: Coronary artery calcification and aortic atherosclerotic calcification. ABDOMEN/PELVIS: No abnormal hypermetabolic activity within the liver, pancreas, adrenal glands, or spleen. No hypermetabolic lymph nodes in the abdomen or pelvis. Low-density collection along the sigmoid colon with peripheral calcifications (image 148/4 ) does not have metabolic activity. Incidental CT findings: none SKELETON: No focal hypermetabolic activity to suggest skeletal metastasis.  Incidental CT findings: none IMPRESSION: 1. Hypermetabolic nodule in the lateral aspect of the LEFT upper lobe consists with primary bronchogenic carcinoma. 2. Ipsilateral hilar hypermetabolic nodal metastasis and subcarinal mediastinal nodal metastasis. 3. Diffuse metabolic activity associated with the LEFT pleural surface is most concerning for diffuse pleural metastasis in the LEFT hemithorax. 4. No evidence of metastatic disease outside of the LEFT hemithorax or mediastinum. 5. 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 Consider ENT consultation at some point following lung cancer evaluation / treatment. Electronically Signed   By: Suzy Bouchard M.D.   On: 09/20/2018 15:35   Ct Biopsy  Result Date: 10/01/2018 INDICATION: Hypermetabolic left-sided pleural nodularity worrisome for malignancy. Please perform CT-guided biopsy for tissue diagnostic purposes. EXAM: CT GUIDED BIOPSY OF DOMINANT HYPERMETABOLIC LEFT UPPER LOBE/PLEURAL NODULE COMPARISON:  PET-CT-09/20/2018 MEDICATIONS: None. ANESTHESIA/SEDATION: Fentanyl 50 mcg IV; Versed 2 mg IV Sedation time: 35 minutes; The patient was continuously monitored during the procedure by the interventional radiology nurse under my direct supervision. CONTRAST:  None COMPLICATIONS: None immediate. PROCEDURE: Informed consent was obtained from the patient following an explanation of the procedure, risks, benefits and alternatives. The patient understands,agrees and consents for the procedure. All questions were addressed. A time out was performed prior to the initiation of the procedure. The patient was positioned supine, slightly RPO on the CT table and a limited chest CT was performed for procedural planning demonstrating unchanged size and appearance the approximately 2.2 x 1.8 cm hypermetabolic subpleural nodule within the  right upper lobe (image 14, series  3). The operative site was prepped and draped in the usual sterile fashion. Under sterile conditions and local anesthesia, a 17 gauge coaxial needle was advanced into the peripheral aspect of the nodule. Positioning was confirmed with intermittent CT fluoroscopy and followed by the acquisition of 5 core needle biopsies with an 18 gauge core needle biopsy device. The coaxial needle was removed following deployment of a [employer] plug and superficial hemostasis was achieved with manual compression. Limited post procedural chest CT was negative for pneumothorax or additional complication. A dressing was placed. The patient tolerated the procedure well without immediate postprocedural complication. The patient was escorted to have an upright chest radiograph. Postprocedural imaging demonstrates worsening pleural nodularity about the medial basilar segment of the left lower lobe with dominant pleural nodule now measuring approximately 2.0 x 1.5 cm and adjacent pleural nodule measuring approximately 1.5 x 0.8 cm (both nodules seen on image 17, series 2), neither of which were seen on preceding PET-CT performed 09/20/2018. IMPRESSION: 1. Technically successful CT guided core needle core biopsy of dominant hypermetabolic right upper lobe/pleural nodule. 2. Worsening pleural nodularity involving the medial basilar segment of the left lower lobe. Electronically Signed   By: Sandi Mariscal M.D.   On: 10/01/2018 13:44   Dg Chest Port 1 View  Result Date: 10/01/2018 CLINICAL DATA:  Post left lung biopsy EXAM: PORTABLE CHEST 1 VIEW COMPARISON:  PET 09/20/2018, chest x-ray 09/12/2018 FINDINGS: Diffuse pleural thickening on the left is unchanged with consolidation left lower lobe. No pneumothorax post left lung biopsy COPD with hyperinflation of the right lung which is clear. Atherosclerotic aortic arch. IMPRESSION: No acute complication post needle biopsy left lung nodule. Electronically Signed   By: Franchot Gallo M.D.   On:  10/01/2018 12:43   Dg Chest Port 1 View  Result Date: 09/05/2018 CLINICAL DATA:  78 year old female status post left chest tube removal EXAM: PORTABLE CHEST 1 VIEW COMPARISON:  Multiple prior most recent chest x-ray 09/03/2018, CT 09/02/2018 FINDINGS: Cardiomediastinal silhouette unchanged. Chronic interstitial opacities of the lungs, similar to the prior. Unchanged appearance of ill-defined mixed interstitial and airspace opacity at the left lung base. Interval removal of pigtail drainage catheter from the left pleural space with no visualized pneumothorax. Blunting at the left costophrenic angle persists. Aeration on the right is maintained. IMPRESSION: Interval removal of left thoracostomy tube with no visualized pneumothorax. Similar appearance of mixed interstitial and airspace opacities at the left lung base with likely residual small volume pleural thickening/fluid. Electronically Signed   By: Corrie Mckusick D.O.   On: 09/05/2018 15:15   Dg Chest Port 1 View  Result Date: 09/03/2018 CLINICAL DATA:  Pleural effusion EXAM: PORTABLE CHEST 1 VIEW COMPARISON:  CT chest 09/01/2018 FINDINGS: The lungs are hyperinflated likely secondary to COPD. There is a small left pleural effusion. There is a left pleural drainage catheter present. There is chronic bilateral interstitial thickening. There is no pneumothorax. The heart mediastinum are stable. The osseous structures are unremarkable. IMPRESSION: 1. Small left pleural effusion with a left pleural drainage catheter present. No pneumothorax. Electronically Signed   By: Kathreen Devoid   On: 09/03/2018 16:04   Vas [place] Lower Extremity Venous (dvt)  Result Date: 10/02/2018  Lower Venous Study Indications: Edema.  Performing Technologist: Oliver Hum RVT  Examination Guidelines: A complete evaluation includes B-mode imaging, spectral Doppler, color Doppler, and power Doppler as needed of all accessible portions of each vessel. Bilateral testing is considered an  integral part of a complete examination. Limited examinations for reoccurring indications may be performed as noted.  Right Venous Findings: +---------+---------------+---------+-----------+----------+-------+          CompressibilityPhasicitySpontaneityPropertiesSummary +---------+---------------+---------+-----------+----------+-------+ CFV      Full           Yes      Yes                          +---------+---------------+---------+-----------+----------+-------+ SFJ      Full                                                 +---------+---------------+---------+-----------+----------+-------+ FV Prox  Full                                                 +---------+---------------+---------+-----------+----------+-------+ FV Mid   Full                                                 +---------+---------------+---------+-----------+----------+-------+ FV DistalFull                                                 +---------+---------------+---------+-----------+----------+-------+ PFV      Full                                                 +---------+---------------+---------+-----------+----------+-------+ POP      Full           Yes      Yes                          +---------+---------------+---------+-----------+----------+-------+ PTV      Full                                                 +---------+---------------+---------+-----------+----------+-------+ PERO     Full                                                 +---------+---------------+---------+-----------+----------+-------+  Left Venous Findings: +---------+---------------+---------+-----------+----------+-------+          CompressibilityPhasicitySpontaneityPropertiesSummary +---------+---------------+---------+-----------+----------+-------+ CFV      Full           Yes      Yes                           +---------+---------------+---------+-----------+----------+-------+ SFJ      Full                                                 +---------+---------------+---------+-----------+----------+-------+  FV Prox  Full                                                 +---------+---------------+---------+-----------+----------+-------+ FV Mid   Full                                                 +---------+---------------+---------+-----------+----------+-------+ FV DistalFull                                                 +---------+---------------+---------+-----------+----------+-------+ PFV      Full                                                 +---------+---------------+---------+-----------+----------+-------+ POP      Full           Yes      Yes                          +---------+---------------+---------+-----------+----------+-------+ PTV      Full                                                 +---------+---------------+---------+-----------+----------+-------+ PERO     Full                                                 +---------+---------------+---------+-----------+----------+-------+    Summary: Right: There is no evidence of deep vein thrombosis in the lower extremity. No cystic structure found in the popliteal fossa. Left: There is no evidence of deep vein thrombosis in the lower extremity. No cystic structure found in the popliteal fossa.  *See table(s) above for measurements and observations.    Final (Updated)      Assessment & Plan:   Adenocarcinoma of left lung, stage 4 (HCC) - New dx non-small cell lung cancer highly suspicious for adenocarcinoma diagnosed in January 2020 - Plans on starting systemic chemotherapy (carboplatin for AUC of 5, Alimta 500 mg/M2 and Avastin 15 mg/KG) every 3 weeks - Port-a-cath to be placed 10/16/18 - Following with Oncology/Dr. Julien Nordmann, next visit 10/17/18    COPD (chronic obstructive pulmonary  disease) (HCC) - Continues Anoro 1 puff daily and prn Albuterol neb  Chronic respiratory failure with hypoxia (HCC) - O2 sat 88% RA with ambulation  - Qualified for POC today, needs 5L pulsed oxygen on exertion to keep O2 >90%  - Needs to change DME from [facility] to [facility], NP 10/03/2018

## 2018-10-03 NOTE — Assessment & Plan Note (Addendum)
-   O2 sat 88% RA with ambulation  - Qualified for POC today, needs 5L pulsed oxygen on exertion to keep O2 >90%  - Needs to change DME from [facility] to [facility]

## 2018-10-03 NOTE — Patient Instructions (Addendum)
Continue Anoro 1 puff daily Albuterol nebulizer every 4 hours as needed for shortness of breath/wheezing   Needs 3L oxygen at rest; 5L pulsed oxygen on exertion   Changing DME company from [facility] to [facility]  (if you have not heard anything from them in 1 week please contact us)  Next follow-up March 3rd at 11:15am with Dr. Lake Bells

## 2018-10-03 NOTE — Assessment & Plan Note (Signed)
-   Continues Anoro 1 puff daily and prn Albuterol neb

## 2018-10-04 ENCOUNTER — Telehealth: Payer: Self-pay | Admitting: Primary Care

## 2018-10-04 DIAGNOSIS — C3412 Malignant neoplasm of upper lobe, left bronchus or lung: Secondary | ICD-10-CM | POA: Diagnosis not present

## 2018-10-04 DIAGNOSIS — Z9981 Dependence on supplemental oxygen: Secondary | ICD-10-CM | POA: Diagnosis not present

## 2018-10-04 DIAGNOSIS — L931 Subacute cutaneous lupus erythematosus: Secondary | ICD-10-CM | POA: Diagnosis not present

## 2018-10-04 DIAGNOSIS — J441 Chronic obstructive pulmonary disease with (acute) exacerbation: Secondary | ICD-10-CM | POA: Diagnosis not present

## 2018-10-04 DIAGNOSIS — E785 Hyperlipidemia, unspecified: Secondary | ICD-10-CM | POA: Diagnosis not present

## 2018-10-04 DIAGNOSIS — M810 Age-related osteoporosis without current pathological fracture: Secondary | ICD-10-CM | POA: Diagnosis not present

## 2018-10-04 DIAGNOSIS — I2510 Atherosclerotic heart disease of native coronary artery without angina pectoris: Secondary | ICD-10-CM | POA: Diagnosis not present

## 2018-10-04 DIAGNOSIS — M0579 Rheumatoid arthritis with rheumatoid factor of multiple sites without organ or systems involvement: Secondary | ICD-10-CM | POA: Diagnosis not present

## 2018-10-04 DIAGNOSIS — C771 Secondary and unspecified malignant neoplasm of intrathoracic lymph nodes: Secondary | ICD-10-CM | POA: Diagnosis not present

## 2018-10-04 DIAGNOSIS — Z79899 Other long term (current) drug therapy: Secondary | ICD-10-CM | POA: Diagnosis not present

## 2018-10-04 DIAGNOSIS — Z7951 Long term (current) use of inhaled steroids: Secondary | ICD-10-CM | POA: Diagnosis not present

## 2018-10-04 DIAGNOSIS — G629 Polyneuropathy, unspecified: Secondary | ICD-10-CM | POA: Diagnosis not present

## 2018-10-04 DIAGNOSIS — J910 Malignant pleural effusion: Secondary | ICD-10-CM | POA: Diagnosis not present

## 2018-10-04 DIAGNOSIS — Z7952 Long term (current) use of systemic steroids: Secondary | ICD-10-CM | POA: Diagnosis not present

## 2018-10-04 NOTE — Telephone Encounter (Signed)
Spoke to patient's spouse.  Everything is good for now.  Will call with another update when available.

## 2018-10-05 ENCOUNTER — Other Ambulatory Visit: Payer: Self-pay | Admitting: *Deleted

## 2018-10-05 ENCOUNTER — Encounter: Payer: Self-pay | Admitting: *Deleted

## 2018-10-05 ENCOUNTER — Telehealth: Payer: Self-pay | Admitting: Primary Care

## 2018-10-05 DIAGNOSIS — C3492 Malignant neoplasm of unspecified part of left bronchus or lung: Secondary | ICD-10-CM | POA: Diagnosis not present

## 2018-10-05 DIAGNOSIS — J9611 Chronic respiratory failure with hypoxia: Secondary | ICD-10-CM | POA: Diagnosis not present

## 2018-10-05 DIAGNOSIS — J449 Chronic obstructive pulmonary disease, unspecified: Secondary | ICD-10-CM | POA: Diagnosis not present

## 2018-10-05 NOTE — Progress Notes (Signed)
Oncology Nurse Navigator Documentation  Oncology Nurse Navigator Flowsheets 10/05/2018  Navigator Location [facility]  Navigator Encounter Type Other/per Dr. Julien Nordmann, foundation one and PDL 1 sent 10/04/2018  Abnormal Finding Date 09/01/2018  Confirmed Diagnosis Date 10/01/2018  Treatment Initiated Date 10/10/2018  Treatment Phase Pre-Tx/Tx Discussion  Barriers/Navigation Needs Coordination of Care  Interventions Coordination of Care  Coordination of Care Other  Acuity Level 2  Time Spent with Patient 15

## 2018-10-05 NOTE — Telephone Encounter (Signed)
Spoke to Donna Mcneil at [facility] and she stated the oxygen and supplies were delivered and set up on 10/04/18.  They educated the pt on the use of the equipment.  Will route to [facility] for FYI.

## 2018-10-05 NOTE — Progress Notes (Signed)
The proposed treatment discussed in cancer conference 10/04/2018 is for discussion purpose only and is not a binding recommendation.  The patient was not physically examined nor present for their treatment options.  Therefore, final treatment plans cannot be decided.

## 2018-10-06 LAB — FUNGAL ORGANISM REFLEX

## 2018-10-06 LAB — FUNGUS CULTURE RESULT

## 2018-10-06 LAB — FUNGUS CULTURE WITH STAIN

## 2018-10-09 ENCOUNTER — Telehealth: Payer: Self-pay | Admitting: Primary Care

## 2018-10-09 DIAGNOSIS — J9611 Chronic respiratory failure with hypoxia: Secondary | ICD-10-CM

## 2018-10-09 NOTE — Telephone Encounter (Signed)
Spoke with pt's friend Herbie Baltimore (dpr on file), states that since pt has been switched to [facility] she no longer needs the O2 supplied by [facility].  They are requesting this to be picked up.  Order was placed earlier this month to switch to [facility] for O2 switch.  Placed order to have [facility] pick up O2 from patient.  Nothing further needed at this time- will close encounter.

## 2018-10-10 ENCOUNTER — Inpatient Hospital Stay: Payer: Medicare Other

## 2018-10-10 ENCOUNTER — Encounter: Payer: Self-pay | Admitting: Internal Medicine

## 2018-10-10 VITALS — BP 155/63 | HR 98 | Temp 98.5°F | Resp 18 | Wt 136.8 lb

## 2018-10-10 DIAGNOSIS — J910 Malignant pleural effusion: Secondary | ICD-10-CM | POA: Diagnosis not present

## 2018-10-10 DIAGNOSIS — C3492 Malignant neoplasm of unspecified part of left bronchus or lung: Secondary | ICD-10-CM

## 2018-10-10 DIAGNOSIS — M329 Systemic lupus erythematosus, unspecified: Secondary | ICD-10-CM | POA: Diagnosis not present

## 2018-10-10 DIAGNOSIS — R110 Nausea: Secondary | ICD-10-CM | POA: Diagnosis not present

## 2018-10-10 DIAGNOSIS — Z79899 Other long term (current) drug therapy: Secondary | ICD-10-CM | POA: Diagnosis not present

## 2018-10-10 DIAGNOSIS — C3412 Malignant neoplasm of upper lobe, left bronchus or lung: Secondary | ICD-10-CM | POA: Diagnosis not present

## 2018-10-10 DIAGNOSIS — Z7982 Long term (current) use of aspirin: Secondary | ICD-10-CM | POA: Diagnosis not present

## 2018-10-10 DIAGNOSIS — M810 Age-related osteoporosis without current pathological fracture: Secondary | ICD-10-CM | POA: Diagnosis not present

## 2018-10-10 DIAGNOSIS — J449 Chronic obstructive pulmonary disease, unspecified: Secondary | ICD-10-CM | POA: Diagnosis not present

## 2018-10-10 DIAGNOSIS — Z5111 Encounter for antineoplastic chemotherapy: Secondary | ICD-10-CM | POA: Diagnosis not present

## 2018-10-10 DIAGNOSIS — C782 Secondary malignant neoplasm of pleura: Secondary | ICD-10-CM | POA: Diagnosis not present

## 2018-10-10 DIAGNOSIS — C771 Secondary and unspecified malignant neoplasm of intrathoracic lymph nodes: Secondary | ICD-10-CM | POA: Diagnosis not present

## 2018-10-10 DIAGNOSIS — I700 Atherosclerosis of aorta: Secondary | ICD-10-CM | POA: Diagnosis not present

## 2018-10-10 DIAGNOSIS — I10 Essential (primary) hypertension: Secondary | ICD-10-CM | POA: Diagnosis not present

## 2018-10-10 DIAGNOSIS — F17210 Nicotine dependence, cigarettes, uncomplicated: Secondary | ICD-10-CM | POA: Diagnosis not present

## 2018-10-10 DIAGNOSIS — E7800 Pure hypercholesterolemia, unspecified: Secondary | ICD-10-CM | POA: Diagnosis not present

## 2018-10-10 DIAGNOSIS — M069 Rheumatoid arthritis, unspecified: Secondary | ICD-10-CM | POA: Diagnosis not present

## 2018-10-10 DIAGNOSIS — Z5112 Encounter for antineoplastic immunotherapy: Secondary | ICD-10-CM | POA: Diagnosis not present

## 2018-10-10 LAB — CBC WITH DIFFERENTIAL (CANCER CENTER ONLY)
Abs Immature Granulocytes: 0.08 10*3/uL — ABNORMAL HIGH (ref 0.00–0.07)
Basophils Absolute: 0.1 10*3/uL (ref 0.0–0.1)
Basophils Relative: 1 %
Eosinophils Absolute: 0.1 10*3/uL (ref 0.0–0.5)
Eosinophils Relative: 1 %
HEMATOCRIT: 35.4 % — AB (ref 36.0–46.0)
HEMOGLOBIN: 11.8 g/dL — AB (ref 12.0–15.0)
Immature Granulocytes: 1 %
Lymphocytes Relative: 19 %
Lymphs Abs: 2.2 10*3/uL (ref 0.7–4.0)
MCH: 29.8 pg (ref 26.0–34.0)
MCHC: 33.3 g/dL (ref 30.0–36.0)
MCV: 89.4 fL (ref 80.0–100.0)
Monocytes Absolute: 1 10*3/uL (ref 0.1–1.0)
Monocytes Relative: 8 %
Neutro Abs: 8.2 10*3/uL — ABNORMAL HIGH (ref 1.7–7.7)
Neutrophils Relative %: 70 %
Platelet Count: 469 10*3/uL — ABNORMAL HIGH (ref 150–400)
RBC: 3.96 MIL/uL (ref 3.87–5.11)
RDW: 14.2 % (ref 11.5–15.5)
WBC Count: 11.6 10*3/uL — ABNORMAL HIGH (ref 4.0–10.5)
nRBC: 0 % (ref 0.0–0.2)

## 2018-10-10 LAB — TOTAL PROTEIN, URINE DIPSTICK: Protein, ur: <30 mg/dL

## 2018-10-10 LAB — CMP (CANCER CENTER ONLY)
ALK PHOS: 89 U/L (ref 38–126)
ALT: 12 U/L (ref 0–44)
ANION GAP: 13 (ref 5–15)
AST: 17 U/L (ref 15–41)
Albumin: 2.3 g/dL — ABNORMAL LOW (ref 3.5–5.0)
BUN: 13 mg/dL (ref 8–23)
CO2: 24 mmol/L (ref 22–32)
Calcium: 9.2 mg/dL (ref 8.9–10.3)
Chloride: 103 mmol/L (ref 98–111)
Creatinine: 0.69 mg/dL (ref 0.44–1.00)
GFR, Est AFR Am: >60 mL/min (ref >60)
GFR, Estimated: >60 mL/min (ref >60)
Glucose, Bld: 98 mg/dL (ref 70–99)
POTASSIUM: 3.1 mmol/L — AB (ref 3.5–5.1)
Sodium: 140 mmol/L (ref 135–145)
Total Bilirubin: 0.4 mg/dL (ref 0.3–1.2)
Total Protein: 7.1 g/dL (ref 6.5–8.1)

## 2018-10-10 MED ORDER — PALONOSETRON HCL INJECTION 0.25 MG/5ML
INTRAVENOUS | Status: AC
  Filled 2018-10-10: qty 5

## 2018-10-10 MED ORDER — SODIUM CHLORIDE 0.9 % IV SOLN
Freq: Once | INTRAVENOUS | Status: AC
  Administered 2018-10-10: 10:00:00 via INTRAVENOUS
  Filled 2018-10-10: qty 5

## 2018-10-10 MED ORDER — SODIUM CHLORIDE 0.9 % IV SOLN
Freq: Once | INTRAVENOUS | Status: AC
  Administered 2018-10-10: 10:00:00 via INTRAVENOUS
  Filled 2018-10-10: qty 250

## 2018-10-10 MED ORDER — SODIUM CHLORIDE 0.9 % IV SOLN
480.0000 mg/m2 | Freq: Once | INTRAVENOUS | Status: AC
  Administered 2018-10-10: 800 mg via INTRAVENOUS
  Filled 2018-10-10: qty 20

## 2018-10-10 MED ORDER — PALONOSETRON HCL INJECTION 0.25 MG/5ML
0.2500 mg | Freq: Once | INTRAVENOUS | Status: AC
  Administered 2018-10-10: 0.25 mg via INTRAVENOUS

## 2018-10-10 MED ORDER — SODIUM CHLORIDE 0.9 % IV SOLN
14.6000 mg/kg | Freq: Once | INTRAVENOUS | Status: AC
  Administered 2018-10-10: 900 mg via INTRAVENOUS
  Filled 2018-10-10: qty 32

## 2018-10-10 MED ORDER — SODIUM CHLORIDE 0.9 % IV SOLN
352.5000 mg | Freq: Once | INTRAVENOUS | Status: AC
  Administered 2018-10-10: 350 mg via INTRAVENOUS
  Filled 2018-10-10: qty 35

## 2018-10-10 NOTE — Patient Instructions (Signed)
[facility] Discharge Instructions for Patients Receiving Chemotherapy  Today you received the following chemotherapy agents Bevacizumab (AVASTIN), Pemetrexed (ALIMTA) & Carboplatin (PARAPLATIN).  To help prevent nausea and vomiting after your treatment, we encourage you to take your nausea medication as prescribed.   If you develop nausea and vomiting that is not controlled by your nausea medication, call the clinic.   BELOW ARE SYMPTOMS THAT SHOULD BE REPORTED IMMEDIATELY:  *FEVER GREATER THAN 100.5 F  *CHILLS WITH OR WITHOUT FEVER  NAUSEA AND VOMITING THAT IS NOT CONTROLLED WITH YOUR NAUSEA MEDICATION  *UNUSUAL SHORTNESS OF BREATH  *UNUSUAL BRUISING OR BLEEDING  TENDERNESS IN MOUTH AND THROAT WITH OR WITHOUT PRESENCE OF ULCERS  *URINARY PROBLEMS  *BOWEL PROBLEMS  UNUSUAL RASH Items with * indicate a potential emergency and should be followed up as soon as possible.  Feel free to call the clinic should you have any questions or concerns. The clinic phone number is [phone].  Please show the [place] at check-in to the Emergency Department and triage nurse.  Bevacizumab injection What is this medicine? BEVACIZUMAB (be va SIZ yoo mab) is a monoclonal antibody. It is used to treat many types of cancer. This medicine may be used for other purposes; ask your health care provider or pharmacist if you have questions. COMMON BRAND NAME(S): Avastin, MVASI What should I tell my health care provider before I take this medicine? They need to know if you have any of these conditions: -diabetes -heart disease -high blood pressure -history of coughing up blood -prior anthracycline chemotherapy (e.g., doxorubicin, daunorubicin, epirubicin) -recent or ongoing radiation therapy -recent or planning to have surgery -stroke -an unusual or allergic reaction to bevacizumab, hamster proteins, mouse proteins, other medicines, foods, dyes, or  preservatives -pregnant or trying to get pregnant -breast-feeding How should I use this medicine? This medicine is for infusion into a vein. It is given by a health care professional in a hospital or clinic setting. Talk to your pediatrician regarding the use of this medicine in children. Special care may be needed. Overdosage: If you think you have taken too much of this medicine contact a poison control center or emergency room at once. NOTE: This medicine is only for you. Do not share this medicine with others. What if I miss a dose? It is important not to miss your dose. Call your doctor or health care professional if you are unable to keep an appointment. What may interact with this medicine? Interactions are not expected. This list may not describe all possible interactions. Give your health care provider a list of all the medicines, herbs, non-prescription drugs, or dietary supplements you use. Also tell them if you smoke, drink alcohol, or use illegal drugs. Some items may interact with your medicine. What should I watch for while using this medicine? Your condition will be monitored carefully while you are receiving this medicine. You will need important blood work and urine testing done while you are taking this medicine. This medicine may increase your risk to bruise or bleed. Call your doctor or health care professional if you notice any unusual bleeding. This medicine should be started at least 28 days following major surgery and the site of the surgery should be totally healed. Check with your doctor before scheduling dental work or surgery while you are receiving this treatment. Talk to your doctor if you have recently had surgery or if you have a wound that has not healed. Do not become pregnant while  taking this medicine or for 6 months after stopping it. Women should inform their doctor if they wish to become pregnant or think they might be pregnant. There is a potential for  serious side effects to an unborn child. Talk to your health care professional or pharmacist for more information. Do not breast-feed an infant while taking this medicine and for 6 months after the last dose. This medicine has caused ovarian failure in some women. This medicine may interfere with the ability to have a child. You should talk to your doctor or health care professional if you are concerned about your fertility. What side effects may I notice from receiving this medicine? Side effects that you should report to your doctor or health care professional as soon as possible: -allergic reactions like skin rash, itching or hives, swelling of the face, lips, or tongue -chest pain or chest tightness -chills -coughing up blood -high fever -seizures -severe constipation -signs and symptoms of bleeding such as bloody or black, tarry stools; red or dark-brown urine; spitting up blood or brown material that looks like coffee grounds; red spots on the skin; unusual bruising or bleeding from the eye, gums, or nose -signs and symptoms of a blood clot such as breathing problems; chest pain; severe, sudden headache; pain, swelling, warmth in the leg -signs and symptoms of a stroke like changes in vision; confusion; trouble speaking or understanding; severe headaches; sudden numbness or weakness of the face, arm or leg; trouble walking; dizziness; loss of balance or coordination -stomach pain -sweating -swelling of legs or ankles -vomiting -weight gain Side effects that usually do not require medical attention (report to your doctor or health care professional if they continue or are bothersome): -back pain -changes in taste -decreased appetite -dry skin -nausea -tiredness This list may not describe all possible side effects. Call your doctor for medical advice about side effects. You may report side effects to FDA at 1-800-FDA-1088. Where should I keep my medicine? This drug is given in a  hospital or clinic and will not be stored at home. NOTE: This sheet is a summary. It may not cover all possible information. If you have questions about this medicine, talk to your doctor, pharmacist, or health care provider.  2019 Elsevier/Gold Standard (2016-08-12 14:33:29)  Pemetrexed injection What is this medicine? PEMETREXED (PEM e TREX ed) is a chemotherapy drug used to treat lung cancers like non-small cell lung cancer and mesothelioma. It may also be used to treat other cancers. This medicine may be used for other purposes; ask your health care provider or pharmacist if you have questions. COMMON BRAND NAME(S): Alimta What should I tell my health care provider before I take this medicine? They need to know if you have any of these conditions: -infection (especially a virus infection such as chickenpox, cold sores, or herpes) -kidney disease -low blood counts, like low white cell, platelet, or red cell counts -lung or breathing disease, like asthma -radiation therapy -an unusual or allergic reaction to pemetrexed, other medicines, foods, dyes, or preservative -pregnant or trying to get pregnant -breast-feeding How should I use this medicine? This drug is given as an infusion into a vein. It is administered in a hospital or clinic by a specially trained health care professional. Talk to your pediatrician regarding the use of this medicine in children. Special care may be needed. Overdosage: If you think you have taken too much of this medicine contact a poison control center or emergency room at once. NOTE: This  medicine is only for you. Do not share this medicine with others. What if I miss a dose? It is important not to miss your dose. Call your doctor or health care professional if you are unable to keep an appointment. What may interact with this medicine? This medicine may interact with the following medications: -Ibuprofen This list may not describe all possible  interactions. Give your health care provider a list of all the medicines, herbs, non-prescription drugs, or dietary supplements you use. Also tell them if you smoke, drink alcohol, or use illegal drugs. Some items may interact with your medicine. What should I watch for while using this medicine? Visit your doctor for checks on your progress. This drug may make you feel generally unwell. This is not uncommon, as chemotherapy can affect healthy cells as well as cancer cells. Report any side effects. Continue your course of treatment even though you feel ill unless your doctor tells you to stop. In some cases, you may be given additional medicines to help with side effects. Follow all directions for their use. Call your doctor or health care professional for advice if you get a fever, chills or sore throat, or other symptoms of a cold or flu. Do not treat yourself. This drug decreases your body's ability to fight infections. Try to avoid being around people who are sick. This medicine may increase your risk to bruise or bleed. Call your doctor or health care professional if you notice any unusual bleeding. Be careful brushing and flossing your teeth or using a toothpick because you may get an infection or bleed more easily. If you have any dental work done, tell your dentist you are receiving this medicine. Avoid taking products that contain aspirin, acetaminophen, ibuprofen, naproxen, or ketoprofen unless instructed by your doctor. These medicines may hide a fever. Call your doctor or health care professional if you get diarrhea or mouth sores. Do not treat yourself. To protect your kidneys, drink water or other fluids as directed while you are taking this medicine. Do not become pregnant while taking this medicine or for 6 months after stopping it. Women should inform their doctor if they wish to become pregnant or think they might be pregnant. Men should not father a child while taking this medicine and  for 3 months after stopping it. This may interfere with the ability to father a child. You should talk to your doctor or health care professional if you are concerned about your fertility. There is a potential for serious side effects to an unborn child. Talk to your health care professional or pharmacist for more information. Do not breast-feed an infant while taking this medicine or for 1 week after stopping it. What side effects may I notice from receiving this medicine? Side effects that you should report to your doctor or health care professional as soon as possible: -allergic reactions like skin rash, itching or hives, swelling of the face, lips, or tongue -breathing problems -redness, blistering, peeling or loosening of the skin, including inside the mouth -signs and symptoms of bleeding such as bloody or black, tarry stools; red or dark-brown urine; spitting up blood or brown material that looks like coffee grounds; red spots on the skin; unusual bruising or bleeding from the eye, gums, or nose -signs and symptoms of infection like fever or chills; cough; sore throat; pain or trouble passing urine -signs and symptoms of kidney injury like trouble passing urine or change in the amount of urine -signs and symptoms of  liver injury like dark yellow or brown urine; general ill feeling or flu-like symptoms; light-colored stools; loss of appetite; nausea; right upper belly pain; unusually weak or tired; yellowing of the eyes or skin Side effects that usually do not require medical attention (report to your doctor or health care professional if they continue or are bothersome): -constipation -mouth sores -nausea, vomiting -unusually weak or tired This list may not describe all possible side effects. Call your doctor for medical advice about side effects. You may report side effects to FDA at 1-800-FDA-1088. Where should I keep my medicine? This drug is given in a hospital or clinic and will not be  stored at home. NOTE: This sheet is a summary. It may not cover all possible information. If you have questions about this medicine, talk to your doctor, pharmacist, or health care provider.  2019 Elsevier/Gold Standard (2017-10-04 16:11:33)  Carboplatin injection What is this medicine? CARBOPLATIN (KAR boe pla tin) is a chemotherapy drug. It targets fast dividing cells, like cancer cells, and causes these cells to die. This medicine is used to treat ovarian cancer and many other cancers. This medicine may be used for other purposes; ask your health care provider or pharmacist if you have questions. COMMON BRAND NAME(S): Paraplatin What should I tell my health care provider before I take this medicine? They need to know if you have any of these conditions: -blood disorders -hearing problems -kidney disease -recent or ongoing radiation therapy -an unusual or allergic reaction to carboplatin, cisplatin, other chemotherapy, other medicines, foods, dyes, or preservatives -pregnant or trying to get pregnant -breast-feeding How should I use this medicine? This drug is usually given as an infusion into a vein. It is administered in a hospital or clinic by a specially trained health care professional. Talk to your pediatrician regarding the use of this medicine in children. Special care may be needed. Overdosage: If you think you have taken too much of this medicine contact a poison control center or emergency room at once. NOTE: This medicine is only for you. Do not share this medicine with others. What if I miss a dose? It is important not to miss a dose. Call your doctor or health care professional if you are unable to keep an appointment. What may interact with this medicine? -medicines for seizures -medicines to increase blood counts like filgrastim, pegfilgrastim, sargramostim -some antibiotics like amikacin, gentamicin, neomycin, streptomycin, tobramycin -vaccines Talk to your doctor or  health care professional before taking any of these medicines: -acetaminophen -aspirin -ibuprofen -ketoprofen -naproxen This list may not describe all possible interactions. Give your health care provider a list of all the medicines, herbs, non-prescription drugs, or dietary supplements you use. Also tell them if you smoke, drink alcohol, or use illegal drugs. Some items may interact with your medicine. What should I watch for while using this medicine? Your condition will be monitored carefully while you are receiving this medicine. You will need important blood work done while you are taking this medicine. This drug may make you feel generally unwell. This is not uncommon, as chemotherapy can affect healthy cells as well as cancer cells. Report any side effects. Continue your course of treatment even though you feel ill unless your doctor tells you to stop. In some cases, you may be given additional medicines to help with side effects. Follow all directions for their use. Call your doctor or health care professional for advice if you get a fever, chills or sore throat, or other symptoms  of a cold or flu. Do not treat yourself. This drug decreases your body's ability to fight infections. Try to avoid being around people who are sick. This medicine may increase your risk to bruise or bleed. Call your doctor or health care professional if you notice any unusual bleeding. Be careful brushing and flossing your teeth or using a toothpick because you may get an infection or bleed more easily. If you have any dental work done, tell your dentist you are receiving this medicine. Avoid taking products that contain aspirin, acetaminophen, ibuprofen, naproxen, or ketoprofen unless instructed by your doctor. These medicines may hide a fever. Do not become pregnant while taking this medicine. Women should inform their doctor if they wish to become pregnant or think they might be pregnant. There is a potential for  serious side effects to an unborn child. Talk to your health care professional or pharmacist for more information. Do not breast-feed an infant while taking this medicine. What side effects may I notice from receiving this medicine? Side effects that you should report to your doctor or health care professional as soon as possible: -allergic reactions like skin rash, itching or hives, swelling of the face, lips, or tongue -signs of infection - fever or chills, cough, sore throat, pain or difficulty passing urine -signs of decreased platelets or bleeding - bruising, pinpoint red spots on the skin, black, tarry stools, nosebleeds -signs of decreased red blood cells - unusually weak or tired, fainting spells, lightheadedness -breathing problems -changes in hearing -changes in vision -chest pain -high blood pressure -low blood counts - This drug may decrease the number of white blood cells, red blood cells and platelets. You may be at increased risk for infections and bleeding. -nausea and vomiting -pain, swelling, redness or irritation at the injection site -pain, tingling, numbness in the hands or feet -problems with balance, talking, walking -trouble passing urine or change in the amount of urine Side effects that usually do not require medical attention (report to your doctor or health care professional if they continue or are bothersome): -hair loss -loss of appetite -metallic taste in the mouth or changes in taste This list may not describe all possible side effects. Call your doctor for medical advice about side effects. You may report side effects to FDA at 1-800-FDA-1088. Where should I keep my medicine? This drug is given in a hospital or clinic and will not be stored at home. NOTE: This sheet is a summary. It may not cover all possible information. If you have questions about this medicine, talk to your doctor, pharmacist, or health care provider.  2019 Elsevier/Gold Standard  (2007-11-20 14:38:05)

## 2018-10-10 NOTE — Progress Notes (Signed)
Met w/ pt to introduce myself as her Arboriculturist.  Pt has 2 insurances so copay assistance isn't needed.  I offered the [place], went over what it covers, gave her an expense sheet and the income requirement.  She provided proof of income and was approved for the $700 [employer].  She has my card for any questions or concerns she may have in the future.

## 2018-10-11 ENCOUNTER — Other Ambulatory Visit: Payer: Self-pay | Admitting: Family Medicine

## 2018-10-11 ENCOUNTER — Encounter (HOSPITAL_COMMUNITY): Payer: Self-pay | Admitting: Internal Medicine

## 2018-10-11 DIAGNOSIS — C771 Secondary and unspecified malignant neoplasm of intrathoracic lymph nodes: Secondary | ICD-10-CM | POA: Diagnosis not present

## 2018-10-11 DIAGNOSIS — Z7952 Long term (current) use of systemic steroids: Secondary | ICD-10-CM | POA: Diagnosis not present

## 2018-10-11 DIAGNOSIS — Z79899 Other long term (current) drug therapy: Secondary | ICD-10-CM | POA: Diagnosis not present

## 2018-10-11 DIAGNOSIS — E785 Hyperlipidemia, unspecified: Secondary | ICD-10-CM | POA: Diagnosis not present

## 2018-10-11 DIAGNOSIS — J441 Chronic obstructive pulmonary disease with (acute) exacerbation: Secondary | ICD-10-CM | POA: Diagnosis not present

## 2018-10-11 DIAGNOSIS — G629 Polyneuropathy, unspecified: Secondary | ICD-10-CM | POA: Diagnosis not present

## 2018-10-11 DIAGNOSIS — J910 Malignant pleural effusion: Secondary | ICD-10-CM | POA: Diagnosis not present

## 2018-10-11 DIAGNOSIS — C3412 Malignant neoplasm of upper lobe, left bronchus or lung: Secondary | ICD-10-CM | POA: Diagnosis not present

## 2018-10-11 DIAGNOSIS — M810 Age-related osteoporosis without current pathological fracture: Secondary | ICD-10-CM | POA: Diagnosis not present

## 2018-10-11 DIAGNOSIS — Z7951 Long term (current) use of inhaled steroids: Secondary | ICD-10-CM | POA: Diagnosis not present

## 2018-10-11 DIAGNOSIS — M0579 Rheumatoid arthritis with rheumatoid factor of multiple sites without organ or systems involvement: Secondary | ICD-10-CM | POA: Diagnosis not present

## 2018-10-11 DIAGNOSIS — I2510 Atherosclerotic heart disease of native coronary artery without angina pectoris: Secondary | ICD-10-CM | POA: Diagnosis not present

## 2018-10-11 DIAGNOSIS — Z9981 Dependence on supplemental oxygen: Secondary | ICD-10-CM | POA: Diagnosis not present

## 2018-10-11 DIAGNOSIS — L931 Subacute cutaneous lupus erythematosus: Secondary | ICD-10-CM | POA: Diagnosis not present

## 2018-10-11 LAB — GUARDANT 360

## 2018-10-12 ENCOUNTER — Telehealth: Payer: Self-pay | Admitting: Medical Oncology

## 2018-10-12 DIAGNOSIS — C3491 Malignant neoplasm of unspecified part of right bronchus or lung: Secondary | ICD-10-CM | POA: Diagnosis not present

## 2018-10-12 NOTE — Telephone Encounter (Addendum)
"  I am drinking water all day long . Eating is fine including snacks and protein". Normal BM. Instructed when to call. SHe also has first alert system set up.

## 2018-10-14 ENCOUNTER — Other Ambulatory Visit: Payer: Self-pay | Admitting: Radiology

## 2018-10-16 ENCOUNTER — Telehealth: Payer: Self-pay | Admitting: Internal Medicine

## 2018-10-16 ENCOUNTER — Other Ambulatory Visit: Payer: Self-pay

## 2018-10-16 ENCOUNTER — Telehealth: Payer: Self-pay | Admitting: Medical Oncology

## 2018-10-16 ENCOUNTER — Other Ambulatory Visit: Payer: Medicare Other

## 2018-10-16 ENCOUNTER — Ambulatory Visit (HOSPITAL_COMMUNITY)
Admission: RE | Admit: 2018-10-16 | Discharge: 2018-10-16 | Disposition: A | Discharge status: Home / Self Care | Payer: Medicare Other | Source: Ambulatory Visit | Attending: Internal Medicine | Admitting: Internal Medicine

## 2018-10-16 ENCOUNTER — Other Ambulatory Visit: Payer: Self-pay | Admitting: Internal Medicine

## 2018-10-16 ENCOUNTER — Encounter (HOSPITAL_COMMUNITY): Payer: Self-pay

## 2018-10-16 ENCOUNTER — Ambulatory Visit: Payer: Medicare Other | Admitting: Nurse Practitioner

## 2018-10-16 DIAGNOSIS — C3492 Malignant neoplasm of unspecified part of left bronchus or lung: Secondary | ICD-10-CM | POA: Diagnosis not present

## 2018-10-16 DIAGNOSIS — I10 Essential (primary) hypertension: Secondary | ICD-10-CM | POA: Insufficient documentation

## 2018-10-16 DIAGNOSIS — J449 Chronic obstructive pulmonary disease, unspecified: Secondary | ICD-10-CM | POA: Diagnosis not present

## 2018-10-16 DIAGNOSIS — Z87891 Personal history of nicotine dependence: Secondary | ICD-10-CM | POA: Diagnosis not present

## 2018-10-16 DIAGNOSIS — Z7982 Long term (current) use of aspirin: Secondary | ICD-10-CM | POA: Diagnosis not present

## 2018-10-16 DIAGNOSIS — Z79899 Other long term (current) drug therapy: Secondary | ICD-10-CM | POA: Diagnosis not present

## 2018-10-16 DIAGNOSIS — E7800 Pure hypercholesterolemia, unspecified: Secondary | ICD-10-CM | POA: Diagnosis not present

## 2018-10-16 DIAGNOSIS — M810 Age-related osteoporosis without current pathological fracture: Secondary | ICD-10-CM | POA: Diagnosis not present

## 2018-10-16 DIAGNOSIS — Z5111 Encounter for antineoplastic chemotherapy: Secondary | ICD-10-CM | POA: Diagnosis not present

## 2018-10-16 HISTORY — PX: IR IMAGING GUIDED PORT INSERTION: IMG5740

## 2018-10-16 LAB — CBC WITH DIFFERENTIAL/PLATELET
Abs Immature Granulocytes: 0.04 10*3/uL (ref 0.00–0.07)
Basophils Absolute: 0 10*3/uL (ref 0.0–0.1)
Basophils Relative: 0 %
Eosinophils Absolute: 0.3 10*3/uL (ref 0.0–0.5)
Eosinophils Relative: 3 %
HEMATOCRIT: 33.6 % — AB (ref 36.0–46.0)
Hemoglobin: 11 g/dL — ABNORMAL LOW (ref 12.0–15.0)
Immature Granulocytes: 1 %
Lymphocytes Relative: 22 %
Lymphs Abs: 1.8 10*3/uL (ref 0.7–4.0)
MCH: 30.3 pg (ref 26.0–34.0)
MCHC: 32.7 g/dL (ref 30.0–36.0)
MCV: 92.6 fL (ref 80.0–100.0)
Monocytes Absolute: 0.2 10*3/uL (ref 0.1–1.0)
Monocytes Relative: 2 %
NEUTROS PCT: 72 %
Neutro Abs: 5.9 10*3/uL (ref 1.7–7.7)
Platelets: 270 10*3/uL (ref 150–400)
RBC: 3.63 MIL/uL — ABNORMAL LOW (ref 3.87–5.11)
RDW: 14.4 % (ref 11.5–15.5)
WBC: 8.2 10*3/uL (ref 4.0–10.5)
nRBC: 0 % (ref 0.0–0.2)

## 2018-10-16 LAB — BASIC METABOLIC PANEL
ANION GAP: 12 (ref 5–15)
BUN: 8 mg/dL (ref 8–23)
CO2: 25 mmol/L (ref 22–32)
Calcium: 9.2 mg/dL (ref 8.9–10.3)
Chloride: 99 mmol/L (ref 98–111)
Creatinine, Ser: 0.52 mg/dL (ref 0.44–1.00)
GFR calc Af Amer: >60 mL/min (ref >60)
GFR calc non Af Amer: >60 mL/min (ref >60)
Glucose, Bld: 82 mg/dL (ref 70–99)
Potassium: 4.3 mmol/L (ref 3.5–5.1)
Sodium: 136 mmol/L (ref 135–145)

## 2018-10-16 LAB — PROTIME-INR
INR: 0.97
PROTHROMBIN TIME: 12.8 s (ref 11.4–15.2)

## 2018-10-16 MED ORDER — LIDOCAINE-EPINEPHRINE (PF) 2 %-1:200000 IJ SOLN
INTRAMUSCULAR | Status: AC
  Filled 2018-10-16: qty 20

## 2018-10-16 MED ORDER — FENTANYL CITRATE (PF) 100 MCG/2ML IJ SOLN
INTRAMUSCULAR | Status: AC
  Filled 2018-10-16: qty 2

## 2018-10-16 MED ORDER — LIDOCAINE-EPINEPHRINE (PF) 1 %-1:200000 IJ SOLN
INTRAMUSCULAR | Status: AC | PRN
  Administered 2018-10-16: 10 mL

## 2018-10-16 MED ORDER — CEFAZOLIN SODIUM-DEXTROSE 2-4 GM/100ML-% IV SOLN
INTRAVENOUS | Status: AC
  Administered 2018-10-16: 2 g via INTRAVENOUS
  Filled 2018-10-16: qty 100

## 2018-10-16 MED ORDER — HEPARIN SOD (PORK) LOCK FLUSH 100 UNIT/ML IV SOLN
INTRAVENOUS | Status: AC | PRN
  Administered 2018-10-16: 500 [IU] via INTRAVENOUS

## 2018-10-16 MED ORDER — FENTANYL CITRATE (PF) 100 MCG/2ML IJ SOLN
INTRAMUSCULAR | Status: AC | PRN
  Administered 2018-10-16: 50 ug via INTRAVENOUS

## 2018-10-16 MED ORDER — SODIUM CHLORIDE 0.9 % IV SOLN
INTRAVENOUS | Status: DC
  Administered 2018-10-16: 13:00:00 via INTRAVENOUS

## 2018-10-16 MED ORDER — CEFAZOLIN SODIUM-DEXTROSE 2-4 GM/100ML-% IV SOLN
2.0000 g | INTRAVENOUS | Status: AC
  Administered 2018-10-16: 2 g via INTRAVENOUS

## 2018-10-16 MED ORDER — MIDAZOLAM HCL 2 MG/2ML IJ SOLN
INTRAMUSCULAR | Status: AC
  Filled 2018-10-16: qty 4

## 2018-10-16 MED ORDER — LIDOCAINE HCL (PF) 1 % IJ SOLN
INTRAMUSCULAR | Status: AC | PRN
  Administered 2018-10-16: 5 mL

## 2018-10-16 MED ORDER — HEPARIN SOD (PORK) LOCK FLUSH 100 UNIT/ML IV SOLN
INTRAVENOUS | Status: AC
  Filled 2018-10-16: qty 5

## 2018-10-16 MED ORDER — MIDAZOLAM HCL 2 MG/2ML IJ SOLN
INTRAMUSCULAR | Status: AC | PRN
  Administered 2018-10-16: 1 mg via INTRAVENOUS

## 2018-10-16 NOTE — Telephone Encounter (Signed)
Called patient - per 2/18 sch message - pt says that they are okay to come in tomorrow no change in appt.

## 2018-10-16 NOTE — Discharge Instructions (Signed)
Implanted Port Insertion, Care After This sheet gives you information about how to care for yourself after your procedure. Your health care provider may also give you more specific instructions. If you have problems or questions, contact your health care provider. What can I expect after the procedure? After the procedure, it is common to have: Discomfort at the port insertion site. Bruising on the skin over the port. This should improve over 3-4 days. Follow these instructions at home: [facility] care After your port is placed, you will get a manufacturer's information card. The card has information about your port. Keep this card with you at all times. Take care of the port as told by your health care provider. Ask your health care provider if you or a family member can get training for taking care of the port at home. A home health care nurse may also take care of the port. Make sure to remember what type of port you have. Incision care     Follow instructions from your health care provider about how to take care of your port insertion site. Make sure you: Wash your hands with soap and water before and after you change your bandage (dressing). If soap and water are not available, use hand sanitizer. Change your dressing as told by your health care provider. Leave stitches (sutures), skin glue, or adhesive strips in place. These skin closures may need to stay in place for 2 weeks or longer. If adhesive strip edges start to loosen and curl up, you may trim the loose edges. Do not remove adhesive strips completely unless your health care provider tells you to do that. Check your port insertion site every day for signs of infection. Check for: Redness, swelling, or pain. Fluid or blood. Warmth. Pus or a bad smell. Activity Return to your normal activities as told by your health care provider. Ask your health care provider what activities are safe for you. Do not lift anything that is heavier than  10 lb (4.5 kg), or the limit that you are told, until your health care provider says that it is safe. General instructions Take over-the-counter and prescription medicines only as told by your health care provider. Do not take baths, swim, or use a hot tub until your health care provider approves. Ask your health care provider if you may take showers. You may only be allowed to take sponge baths. Do not drive for 24 hours if you were given a sedative during your procedure. Wear a medical alert bracelet in case of an emergency. This will tell any health care providers that you have a port. Keep all follow-up visits as told by your health care provider. This is important. Contact a health care provider if: You cannot flush your port with saline as directed, or you cannot draw blood from the port. You have a fever or chills. You have redness, swelling, or pain around your port insertion site. You have fluid or blood coming from your port insertion site. Your port insertion site feels warm to the touch. You have pus or a bad smell coming from the port insertion site. Get help right away if: You have chest pain or shortness of breath. You have bleeding from your port that you cannot control. Summary Take care of the port as told by your health care provider. Keep the manufacturer's information card with you at all times. Change your dressing as told by your health care provider. Contact a health care provider if you have  a fever or chills or if you have redness, swelling, or pain around your port insertion site. Keep all follow-up visits as told by your health care provider. This information is not intended to replace advice given to you by your health care provider. Make sure you discuss any questions you have with your health care provider. Document Released: 06/05/2013 Document Revised: 03/13/2018 Document Reviewed: 03/13/2018 Elsevier Interactive Patient Education  2019 [place] An implanted port is a device that is placed under the skin. It is usually placed in the chest. The device can be used to give IV medicine, to take blood, or for dialysis. You may have an implanted port if:  You need IV medicine that would be irritating to the small veins in your hands or arms.  You need IV medicines, such as antibiotics, for a long period of time.  You need IV nutrition for a long period of time.  You need dialysis. Having a port means that your health care provider will not need to use the veins in your arms for these procedures. You may have fewer limitations when using a port than you would if you used other types of long-term IVs, and you will likely be able to return to normal activities after your incision heals. An implanted port has two main parts:  Reservoir. The reservoir is the part where a needle is inserted to give medicines or draw blood. The reservoir is round. After it is placed, it appears as a small, raised area under your skin.  Catheter. The catheter is a thin, flexible tube that connects the reservoir to a vein. Medicine that is inserted into the reservoir goes into the catheter and then into the vein. How is my port accessed? To access your port:  A numbing cream may be placed on the skin over the port site.  Your health care provider will put on a mask and sterile gloves.  The skin over your port will be cleaned carefully with a germ-killing soap and allowed to dry.  Your health care provider will gently pinch the port and insert a needle into it.  Your health care provider will check for a blood return to make sure the port is in the vein and is not clogged.  If your port needs to remain accessed to get medicine continuously (constant infusion), your health care provider will place a clear bandage (dressing) over the needle site. The dressing and needle will need to be changed every week, or as told by your health  care provider. What is flushing? Flushing helps keep the port from getting clogged. Follow instructions from your health care provider about how and when to flush the port. Ports are usually flushed with saline solution or a medicine called heparin. The need for flushing will depend on how the port is used:  If the port is only used from time to time to give medicines or draw blood, the port may need to be flushed: ? Before and after medicines have been given. ? Before and after blood has been drawn. ? As part of routine maintenance. Flushing may be recommended every 4-6 weeks.  If a constant infusion is running, the port may not need to be flushed.  Throw away any syringes in a disposal container that is meant for sharp items (sharps container). You can buy a sharps container from a pharmacy, or you can make one by using an empty hard plastic bottle with a cover.  How long will my port stay implanted? The port can stay in for as long as your health care provider thinks it is needed. When it is time for the port to come out, a surgery will be done to remove it. The surgery will be similar to the procedure that was done to put the port in. Follow these instructions at home:   Flush your port as told by your health care provider.  If you need an infusion over several days, follow instructions from your health care provider about how to take care of your port site. Make sure you: ? Wash your hands with soap and water before you change your dressing. If soap and water are not available, use alcohol-based hand sanitizer. ? Change your dressing as told by your health care provider. ? Place any used dressings or infusion bags into a plastic bag. Throw that bag in the trash. ? Keep the dressing that covers the needle clean and dry. Do not get it wet. ? Do not use scissors or sharp objects near the tube. ? Keep the tube clamped, unless it is being used.  Check your port site every day for signs of  infection. Check for: ? Redness, swelling, or pain. ? Fluid or blood. ? Pus or a bad smell.  Protect the skin around the port site. ? Avoid wearing bra straps that rub or irritate the site. ? Protect the skin around your port from seat belts. Place a soft pad over your chest if needed.  Bathe or shower as told by your health care provider. The site may get wet as long as you are not actively receiving an infusion.  Return to your normal activities as told by your health care provider. Ask your health care provider what activities are safe for you.  Carry a medical alert card or wear a medical alert bracelet at all times. This will let health care providers know that you have an implanted port in case of an emergency. Get help right away if:  You have redness, swelling, or pain at the port site.  You have fluid or blood coming from your port site.  You have pus or a bad smell coming from the port site.  You have a fever. Summary  Implanted ports are usually placed in the chest for long-term IV access.  Follow instructions from your health care provider about flushing the port and changing bandages (dressings).  Take care of the area around your port by avoiding clothing that puts pressure on the area, and by watching for signs of infection.  Protect the skin around your port from seat belts. Place a soft pad over your chest if needed.  Get help right away if you have a fever or you have redness, swelling, pain, drainage, or a bad smell at the port site. This information is not intended to replace advice given to you by your health care provider. Make sure you discuss any questions you have with your health care provider. Document Released: 08/15/2005 Document Revised: 09/17/2016 Document Reviewed: 09/17/2016 Elsevier Interactive Patient Education  2019 [place]. Moderate Conscious Sedation, Adult, Care After These instructions provide you with information about caring for  yourself after your procedure. Your health care provider may also give you more specific instructions. Your treatment has been planned according to current medical practices, but problems sometimes occur. Call your health care provider if you have any problems or questions after your procedure. What can I expect after the procedure?  After your procedure, it is common:  To feel sleepy for several hours.  To feel clumsy and have poor balance for several hours.  To have poor judgment for several hours.  To vomit if you eat too soon. Follow these instructions at home: For at least 24 hours after the procedure:   Do not: ? Participate in activities where you could fall or become injured. ? Drive. ? Use heavy machinery. ? Drink alcohol. ? Take sleeping pills or medicines that cause drowsiness. ? Make important decisions or sign legal documents. ? Take care of children on your own.  Rest. Eating and drinking  Follow the diet recommended by your health care provider.  If you vomit: ? Drink water, juice, or soup when you can drink without vomiting. ? Make sure you have little or no nausea before eating solid foods. General instructions  Have a responsible adult stay with you until you are awake and alert.  Take over-the-counter and prescription medicines only as told by your health care provider.  If you smoke, do not smoke without supervision.  Keep all follow-up visits as told by your health care provider. This is important. Contact a health care provider if:  You keep feeling nauseous or you keep vomiting.  You feel light-headed.  You develop a rash.  You have a fever. Get help right away if:  You have trouble breathing. This information is not intended to replace advice given to you by your health care provider. Make sure you discuss any questions you have with your health care provider. Document Released: 06/05/2013 Document Revised: 01/18/2016 Document Reviewed:  12/05/2015 Elsevier Interactive Patient Education  2019 [employer].

## 2018-10-16 NOTE — Telephone Encounter (Signed)
Potassium low - Instructed pt to eat foods high in potassium. She voiced understanding.

## 2018-10-16 NOTE — Procedures (Signed)
  Procedure: R IJ Port catheter   EBL:   minimal Complications:  none immediate  See full dictation in [facility] P[facility].  D. Wisam Siefring MD Main # [phone] Pager  [phone]    

## 2018-10-16 NOTE — H&P (Signed)
Chief Complaint: Patient was seen in consultation today for lung cancer  Referring Physician(s): Mohamed,Mohamed  Supervising Physician: Arne Cleveland  Patient Status: [facility]  History of Present Illness: Donna Mcneil is a 78 y.o. female with past medical history of COPD, HTN, lupus.  She is known to radiology from recent lung biopsy on 10/01/18.  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  She has plans to initiate chemotherapy and presents to [facility] Radiology today for Port-A-cath placement.  She has been NPO.  She does not take blood thinners.  She denies concern or complaint today.   Past Medical History:  Diagnosis Date  . COPD exacerbation ([place]) 09/01/2018  . Diverticulitis   . Hypercholesteremia   . Hypertension   . Lupus ([place])   . Osteoporosis   . Tobacco use     Past Surgical History:  Procedure Laterality Date  . ABDOMINAL HYSTERECTOMY  12/1969  . APPENDECTOMY  1957  . CATARACT EXTRACTION    . CHOLECYSTECTOMY  1997  . LAPAROSCOPIC SIGMOID COLECTOMY  2013   secondary to diverticulitis  . spit seed removal    . TONSILLECTOMY  1945    Allergies: Codeine; Cyclosporine; Other; Pregabalin; and Zolpidem  Medications: Prior to Admission medications   Medication Sig Start Date End Date Taking? Authorizing Provider  acetaminophen (TYLENOL) 500 MG tablet Take 1,000 mg by mouth every 6 (six) hours as needed for mild pain.    Yes [provider]  albuterol (PROVENTIL) (2.5 MG/3ML) 0.083% nebulizer solution Take 3 mLs (2.5 mg total) by nebulization every 4 (four) hours as needed for wheezing or shortness of breath. 10/11/18  Yes [name], [name], PA  ALPRAZolam (XANAX) 0.5 MG tablet TAKE 1 TABLET BY MOUTH TWICE A DAY AS NEEDED Patient taking differently: Take 0.5 mg by mouth 2 (two) times daily.  08/20/18  Yes [name], [name], PA  amLODipine (NORVASC) 10 MG tablet TAKE 1 TABLET BY MOUTH ONCE DAILY 10/02/18  Yes  [name], [name], PA  aspirin 81 MG tablet Take 81 mg by mouth daily.   Yes [provider]  atorvastatin (LIPITOR) 40 MG tablet TAKE 1 TABLET BY MOUTH ONCE DAILY Patient taking differently: Take 40 mg by mouth every evening.  05/14/18  Yes [name], [name], PA  b complex vitamins tablet Take 2 tablets by mouth 2 (two) times daily.   Yes [provider]  Biotin (BIOTIN 5000) 5 MG CAPS Take 5,000 mg by mouth daily.   Yes [provider]  Calcium Carbonate-Vitamin D (CALCIUM 600+D) 600-200 MG-UNIT TABS Take 1 tablet by mouth 2 (two) times daily.    Yes [provider]  carboxymethylcellulose (REFRESH TEARS) 0.5 % SOLN Place 1 drop into both eyes daily as needed (dry eyes).    Yes [provider]  Coenzyme Q10 (COQ10) 100 MG CAPS Take 100 mg by mouth daily.   Yes [provider]  folic acid (FOLVITE) 1 MG tablet Take 1 tablet (1 mg total) by mouth daily. 09/26/18  Yes Curt Bears, MD  guaiFENesin (MUCINEX) 600 MG 12 hr tablet Take 1 tablet (600 mg total) by mouth 2 (two) times daily. 09/06/18  Yes Shelly Coss, MD  hydrochlorothiazide (HYDRODIURIL) 50 MG tablet TAKE 1 TABLET BY MOUTH ONCE A DAY Patient taking differently: Take 50 mg by mouth daily.  10/20/17  Yes [name], [name], PA  hydroxychloroquine (PLAQUENIL) 200 MG tablet Take 200 mg by mouth daily.  11/07/17 11/07/18  Yes [provider]  Javier Docker Oil 500 MG CAPS Take 500 mg by mouth daily.   Yes [provider]  methotrexate (RHEUMATREX) 2.5 MG tablet Take 15 mg by mouth every Monday.  10/01/15  Yes [provider]  Multiple Vitamins-Minerals (MULTIVITAMIN ADULT PO) Take 1 tablet by mouth daily.    Yes [provider]  Omega 3 1000 MG CAPS Take 1,000-2,000 mg by mouth See admin instructions. Take 2000 mg in the morning and 1000 mg at night   Yes [provider]  Potassium 99 MG TABS Take 99 mg by mouth daily.    Yes [provider]    PROAIR HFA 108 (90 Base) MCG/ACT inhaler Inhale 2 puffs into the lungs every 6 (six) hours as needed for wheezing or shortness of breath. 09/27/18  Yes [name], [name], PA  senna-docusate (SENOKOT-S) 8.6-50 MG tablet Take 1 tablet by mouth 2 (two) times daily.   Yes [provider]  umeclidinium-vilanterol (ANORO ELLIPTA) 62.5-25 MCG/INH AEPB Inhale 1 puff into the lungs daily. 09/12/18  Yes Martyn Ehrich, NP  Vitamin D, Cholecalciferol, 1000 units TABS Take 1,000 Units by mouth at bedtime.    Yes [provider]  diphenhydrAMINE (BENADRYL) 25 MG tablet Take 25 mg by mouth daily as needed for allergies.    [provider]  gabapentin (NEURONTIN) 100 MG capsule Take 100 mg by mouth 3 (three) times daily.  09/08/15   [provider]  lidocaine-prilocaine (EMLA) cream Apply 1 application topically as needed. 09/26/18   Curt Bears, MD  predniSONE (DELTASONE) 10 MG tablet Take 4 pills daily for 3 days then 3 pills daily for 3 days then 2 pills daily for 3 days then 1 pill daily for 3 days then stop 09/06/18   Shelly Coss, MD  prochlorperazine (COMPAZINE) 10 MG tablet Take 1 tablet (10 mg total) by mouth every 6 (six) hours as needed for nausea or vomiting. 09/26/18   Curt Bears, MD     Family History  Problem Relation Age of Onset  . Alcohol abuse Mother   . Cirrhosis Mother   . Heart disease Father   . Hypertension Father   . Gout Father   . Ulcers Father   . Hyperlipidemia Father   . Alcohol abuse Brother   . Heart disease Brother        MI at age 81  . OCD Son   . Prostatitis Paternal Grandfather   . Pneumonia Paternal Grandfather   . Cancer Son     Social History   Socioeconomic History  . Marital status: Divorced    Spouse name: Not on file  . Number of children: 2  . Years of education: Not on file  . Highest education level: High school graduate  Occupational History  . Occupation: retired  Scientific laboratory technician  . Financial  resource strain: Not hard at all  . Food insecurity:    Worry: Never true    Inability: Never true  . Transportation needs:    Medical: No    Non-medical: No  Tobacco Use  . Smoking status: Former Smoker    Packs/day: 0.50    Types: Cigarettes    Last attempt to quit: 09/01/2018    Years since quitting: 0.1  . Smokeless tobacco: Never Used  . Tobacco comment: started smoking at age 70  Substance and Sexual Activity  . Alcohol use: No    Alcohol/week: 0.0 standard drinks  . Drug use: No  .  Sexual activity: Not Currently  Lifestyle  . Physical activity:    Days per week: Not on file    Minutes per session: Not on file  . Stress: To some extent  Relationships  . Social connections:    Talks on phone: Not on file    Gets together: Not on file    Attends religious service: Not on file    Active member of club or organization: Not on file    Attends meetings of clubs or organizations: Not on file    Relationship status: Not on file  Other Topics Concern  . Not on file  Social History Narrative  . Not on file     Review of Systems: A 12 point ROS discussed and pertinent positives are indicated in the HPI above.  All other systems are negative.  Review of Systems  Constitutional: Negative for fatigue and fever.  Respiratory: Positive for cough. Negative for shortness of breath.   Cardiovascular: Negative for chest pain.  Gastrointestinal: Negative for abdominal pain.  Musculoskeletal: Negative for back pain.  Psychiatric/Behavioral: Negative for behavioral problems and confusion.    Vital Signs: There were no vitals taken for this visit.  Physical Exam Vitals signs and nursing note reviewed.  Constitutional:      Appearance: Normal appearance.  Neck:     Musculoskeletal: Normal range of motion and neck supple.  Cardiovascular:     Rate and Rhythm: Normal rate. Rhythm irregular.     Comments: Occasional extraneous beat Pulmonary:     Effort: Pulmonary effort is  normal. No respiratory distress.     Comments: Decreased breath sounds on left Skin:    General: Skin is warm and dry.  Neurological:     General: No focal deficit present.     Mental Status: She is alert and oriented to person, place, and time.  Psychiatric:        Mood and Affect: Mood normal.        Behavior: Behavior normal.        Thought Content: Thought content normal.        Judgment: Judgment normal.      MD Evaluation Airway: WNL Heart: WNL Abdomen: WNL Chest/ Lungs: WNL ASA  Classification: 3 Mallampati/Airway Score: One   Imaging: Nm Pet Image Initial (pi) Skull Base To Thigh  Result Date: 09/20/2018 CLINICAL DATA:  Initial treatment strategy for LEFT lung cancer. Malignant effusion. EXAM: NUCLEAR MEDICINE PET SKULL BASE TO THIGH TECHNIQUE: 7.24 mCi F-18 FDG was injected intravenously. Full-ring PET imaging was performed from the skull base to thigh after the radiotracer. CT data was obtained and used for attenuation correction and anatomic localization. Fasting blood glucose: 102 mg/dl COMPARISON:  None. FINDINGS: Mediastinal blood pool activity: SUV max 2.24 NECK: Small nodule in the posterior aspect of LEFT parotid gland SUV max equal 5.0. This nodule measures 9 mm on image 25/4. Incidental CT findings: none CHEST: There is a rind of hypermetabolic activity involving the near entirety of the LEFT pleural space. This activity is most intense at the base and apex. For example hypermetabolic activity of the LEFT apical pleura with SUV max equal 8.2. Along the lateral margin the LEFT upper lobe, anterior to the fissure, there is a discrete hypermetabolic nodule measuring 1.7 cm with intense metabolic activity (SUV max equal 11.1). There is a hypermetabolic LEFT hilar lymph node with SUV max equal 12.3. Additional hypermetabolic subcarinal node which is hypermetabolic with SUV max equal 19.5 No hypermetabolic supraclavicular nodes.  No hypermetabolic pulmonary nodules on the  RIGHT. Incidental CT findings: Coronary artery calcification and aortic atherosclerotic calcification. ABDOMEN/PELVIS: No abnormal hypermetabolic activity within the liver, pancreas, adrenal glands, or spleen. No hypermetabolic lymph nodes in the abdomen or pelvis. Low-density collection along the sigmoid colon with peripheral calcifications (image 148/4 ) does not have metabolic activity. Incidental CT findings: none SKELETON: No focal hypermetabolic activity to suggest skeletal metastasis. Incidental CT findings: none IMPRESSION: 1. Hypermetabolic nodule in the lateral aspect of the LEFT upper lobe consists with primary bronchogenic carcinoma. 2. Ipsilateral hilar hypermetabolic nodal metastasis and subcarinal mediastinal nodal metastasis. 3. Diffuse metabolic activity associated with the LEFT pleural surface is most concerning for diffuse pleural metastasis in the LEFT hemithorax. 4. No evidence of metastatic disease outside of the LEFT hemithorax or mediastinum. 5. 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 Consider ENT consultation at some point following lung cancer evaluation / treatment. Electronically Signed   By: Suzy Bouchard M.D.   On: 09/20/2018 15:35   Ct Biopsy  Addendum Date: 10/03/2018   ADDENDUM REPORT: 10/03/2018 14:02 ADDENDUM: The original report was by Dr. Sandi Mariscal. The following addendum is by Dr. Van Clines: This is a brief correction of Dr. Pascal Lux' prior report, impression # 1 should read, "Technically successful CT guided core needle biopsy of dominant hypermetabolic LEFT upper lobe/pleural nodule." Electronically Signed   By: Van Clines M.D.   On: 10/03/2018 14:02   Result Date: 10/03/2018 INDICATION: Hypermetabolic left-sided pleural nodularity worrisome for malignancy. Please perform CT-guided biopsy for tissue diagnostic purposes. EXAM: CT GUIDED BIOPSY OF  DOMINANT HYPERMETABOLIC LEFT UPPER LOBE/PLEURAL NODULE COMPARISON:  PET-CT-09/20/2018 MEDICATIONS: None. ANESTHESIA/SEDATION: Fentanyl 50 mcg IV; Versed 2 mg IV Sedation time: 35 minutes; The patient was continuously monitored during the procedure by the interventional radiology nurse under my direct supervision. CONTRAST:  None COMPLICATIONS: None immediate. PROCEDURE: Informed consent was obtained from the patient following an explanation of the procedure, risks, benefits and alternatives. The patient understands,agrees and consents for the procedure. All questions were addressed. A time out was performed prior to the initiation of the procedure. The patient was positioned supine, slightly RPO on the CT table and a limited chest CT was performed for procedural planning demonstrating unchanged size and appearance the approximately 2.2 x 1.8 cm hypermetabolic subpleural nodule within the right upper lobe (image 14, series 3). The operative site was prepped and draped in the usual sterile fashion. Under sterile conditions and local anesthesia, a 17 gauge coaxial needle was advanced into the peripheral aspect of the nodule. Positioning was confirmed with intermittent CT fluoroscopy and followed by the acquisition of 5 core needle biopsies with an 18 gauge core needle biopsy device. The coaxial needle was removed following deployment of a Biosentry plug and superficial hemostasis was achieved with manual compression. Limited post procedural chest CT was negative for pneumothorax or additional complication. A dressing was placed. The patient tolerated the procedure well without immediate postprocedural complication. The patient was escorted to have an upright chest radiograph. Postprocedural imaging demonstrates worsening pleural nodularity about the medial basilar segment of the left lower lobe with dominant pleural nodule now measuring approximately 2.0 x 1.5 cm and adjacent pleural nodule measuring approximately 1.5  x 0.8 cm (both nodules seen on image 17, series 2), neither of which were seen on preceding PET-CT performed 09/20/2018. IMPRESSION: 1. Technically successful CT guided core needle core biopsy of  dominant hypermetabolic right upper lobe/pleural nodule. 2. Worsening pleural nodularity involving the medial basilar segment of the left lower lobe. Electronically Signed: By: Sandi Mariscal M.D. On: 10/01/2018 13:44   Dg Chest Port 1 View  Result Date: 10/01/2018 CLINICAL DATA:  Post left lung biopsy EXAM: PORTABLE CHEST 1 VIEW COMPARISON:  PET 09/20/2018, chest x-ray 09/12/2018 FINDINGS: Diffuse pleural thickening on the left is unchanged with consolidation left lower lobe. No pneumothorax post left lung biopsy COPD with hyperinflation of the right lung which is clear. Atherosclerotic aortic arch. IMPRESSION: No acute complication post needle biopsy left lung nodule. Electronically Signed   By: Franchot Gallo M.D.   On: 10/01/2018 12:43    Labs:  CBC: Recent Labs    09/26/18 1312 10/01/18 0849 10/10/18 0809 10/16/18 1300  WBC 6.1 9.6 11.6* 8.2  HGB 13.0 12.4 11.8* 11.0*  HCT 38.0 37.2 35.4* 33.6*  PLT 255 326 469* 270    COAGS: Recent Labs    09/02/18 0031 10/01/18 0934 10/16/18 1300  INR 0.98 1.01 0.97  APTT 33  --   --     BMP: Recent Labs    09/26/18 1312 10/01/18 0849 10/10/18 0809 10/16/18 1300  NA 137 136 140 136  K 2.9* 4.7 3.1* 4.3  CL 97* 97* 103 99  CO2 27 24 24 25   GLUCOSE 90 102* 98 82  BUN 11 14 13 8   CALCIUM 9.4 8.6* 9.2 9.2  CREATININE 0.72 0.69 0.69 0.52  GFRNONAA >60 >60 >60 >60  GFRAA >60 >60 >60 >60    LIVER FUNCTION TESTS: Recent Labs    05/14/18 1225  09/02/18 0315 09/02/18 1013 09/12/18 1506 09/26/18 1312 10/10/18 0809  BILITOT 0.5  --  1.2  --   --  0.5 0.4  AST 23  --  17  --  21 35 17  ALT 24  --  18  --  38* 165* 12  ALKPHOS 57  --  43  --   --  151* 89  PROT 7.5  --  6.8 7.2  --  7.1 7.1  ALBUMIN 4.5   < > 2.9* 3.0* 3.7 2.8* 2.3*   <  > = values in this interval not displayed.    TUMOR MARKERS: No results for input(s): AFPTM, CEA, CA199, CHROMGRNA in the last 8760 hours.  Assessment and Plan: Patient with past medical history of HTN, tobacco use presents with complaint of lung adenocarcinoma.  IR consulted for Port-A-Cath placement at the request of Dr. Julien Nordmann. Case reviewed by Dr. Vernard Gambles who approves patient for procedure.  Patient presents today in their usual state of health.  She has been NPO and is not currently on blood thinners.   Risks and benefits of image guided port-a-catheter placement was discussed with the patient including, but not limited to bleeding, infection, pneumothorax, or fibrin sheath development and need for additional procedures.  All of the patient's questions were answered, patient is agreeable to proceed. Consent signed and in chart.   Thank you for this interesting consult.  I greatly enjoyed meeting ANNYA LIZANA and look forward to participating in their care.  A copy of this report was sent to the requesting provider on this date.  Electronically Signed: Docia Barrier, PA 10/16/2018, 1:47 PM   I spent a total of    15 Minutes in face to face in clinical consultation, greater than 50% of which was counseling/coordinating care for lung cancer.

## 2018-10-17 ENCOUNTER — Ambulatory Visit: Payer: Medicare Other | Admitting: Physician Assistant

## 2018-10-17 ENCOUNTER — Inpatient Hospital Stay: Payer: Medicare Other

## 2018-10-17 ENCOUNTER — Inpatient Hospital Stay (HOSPITAL_BASED_OUTPATIENT_CLINIC_OR_DEPARTMENT_OTHER): Payer: Medicare Other | Admitting: Physician Assistant

## 2018-10-17 ENCOUNTER — Other Ambulatory Visit: Payer: Medicare Other

## 2018-10-17 ENCOUNTER — Telehealth: Payer: Self-pay | Admitting: Physician Assistant

## 2018-10-17 ENCOUNTER — Encounter: Payer: Self-pay | Admitting: Physician Assistant

## 2018-10-17 ENCOUNTER — Ambulatory Visit: Payer: Medicare Other | Admitting: Nurse Practitioner

## 2018-10-17 ENCOUNTER — Encounter (HOSPITAL_COMMUNITY): Payer: Self-pay | Admitting: Internal Medicine

## 2018-10-17 VITALS — BP 152/71 | HR 91 | Temp 98.8°F | Resp 20 | Ht 62.0 in | Wt 137.5 lb

## 2018-10-17 DIAGNOSIS — J449 Chronic obstructive pulmonary disease, unspecified: Secondary | ICD-10-CM | POA: Diagnosis not present

## 2018-10-17 DIAGNOSIS — Z79899 Other long term (current) drug therapy: Secondary | ICD-10-CM | POA: Diagnosis not present

## 2018-10-17 DIAGNOSIS — M329 Systemic lupus erythematosus, unspecified: Secondary | ICD-10-CM | POA: Diagnosis not present

## 2018-10-17 DIAGNOSIS — R110 Nausea: Secondary | ICD-10-CM

## 2018-10-17 DIAGNOSIS — E7800 Pure hypercholesterolemia, unspecified: Secondary | ICD-10-CM | POA: Diagnosis not present

## 2018-10-17 DIAGNOSIS — C3492 Malignant neoplasm of unspecified part of left bronchus or lung: Secondary | ICD-10-CM

## 2018-10-17 DIAGNOSIS — J910 Malignant pleural effusion: Secondary | ICD-10-CM

## 2018-10-17 DIAGNOSIS — C3412 Malignant neoplasm of upper lobe, left bronchus or lung: Secondary | ICD-10-CM

## 2018-10-17 DIAGNOSIS — C782 Secondary malignant neoplasm of pleura: Secondary | ICD-10-CM | POA: Diagnosis not present

## 2018-10-17 DIAGNOSIS — F17210 Nicotine dependence, cigarettes, uncomplicated: Secondary | ICD-10-CM

## 2018-10-17 DIAGNOSIS — I700 Atherosclerosis of aorta: Secondary | ICD-10-CM | POA: Diagnosis not present

## 2018-10-17 DIAGNOSIS — C771 Secondary and unspecified malignant neoplasm of intrathoracic lymph nodes: Secondary | ICD-10-CM | POA: Diagnosis not present

## 2018-10-17 DIAGNOSIS — M810 Age-related osteoporosis without current pathological fracture: Secondary | ICD-10-CM | POA: Diagnosis not present

## 2018-10-17 DIAGNOSIS — Z5111 Encounter for antineoplastic chemotherapy: Secondary | ICD-10-CM

## 2018-10-17 DIAGNOSIS — Z5112 Encounter for antineoplastic immunotherapy: Secondary | ICD-10-CM

## 2018-10-17 DIAGNOSIS — I10 Essential (primary) hypertension: Secondary | ICD-10-CM | POA: Diagnosis not present

## 2018-10-17 DIAGNOSIS — Z7982 Long term (current) use of aspirin: Secondary | ICD-10-CM | POA: Diagnosis not present

## 2018-10-17 DIAGNOSIS — Z95828 Presence of other vascular implants and grafts: Secondary | ICD-10-CM

## 2018-10-17 DIAGNOSIS — M069 Rheumatoid arthritis, unspecified: Secondary | ICD-10-CM

## 2018-10-17 LAB — CBC WITH DIFFERENTIAL (CANCER CENTER ONLY)
Abs Immature Granulocytes: 0.02 10*3/uL (ref 0.00–0.07)
Basophils Absolute: 0 10*3/uL (ref 0.0–0.1)
Basophils Relative: 1 %
Eosinophils Absolute: 0.3 10*3/uL (ref 0.0–0.5)
Eosinophils Relative: 5 %
HCT: 30.7 % — ABNORMAL LOW (ref 36.0–46.0)
Hemoglobin: 10.4 g/dL — ABNORMAL LOW (ref 12.0–15.0)
Immature Granulocytes: 0 %
Lymphocytes Relative: 35 %
Lymphs Abs: 2 10*3/uL (ref 0.7–4.0)
MCH: 30.6 pg (ref 26.0–34.0)
MCHC: 33.9 g/dL (ref 30.0–36.0)
MCV: 90.3 fL (ref 80.0–100.0)
Monocytes Absolute: 0.3 10*3/uL (ref 0.1–1.0)
Monocytes Relative: 5 %
Neutro Abs: 3.1 10*3/uL (ref 1.7–7.7)
Neutrophils Relative %: 54 %
Platelet Count: 211 10*3/uL (ref 150–400)
RBC: 3.4 MIL/uL — ABNORMAL LOW (ref 3.87–5.11)
RDW: 13.9 % (ref 11.5–15.5)
WBC Count: 5.6 10*3/uL (ref 4.0–10.5)
nRBC: 0 % (ref 0.0–0.2)

## 2018-10-17 LAB — CMP (CANCER CENTER ONLY)
ALK PHOS: 72 U/L (ref 38–126)
ALT: 21 U/L (ref 0–44)
ANION GAP: 10 (ref 5–15)
AST: 28 U/L (ref 15–41)
Albumin: 2.4 g/dL — ABNORMAL LOW (ref 3.5–5.0)
BUN: 9 mg/dL (ref 8–23)
CO2: 29 mmol/L (ref 22–32)
Calcium: 9.9 mg/dL (ref 8.9–10.3)
Chloride: 97 mmol/L — ABNORMAL LOW (ref 98–111)
Creatinine: 0.63 mg/dL (ref 0.44–1.00)
GFR, Est AFR Am: >60 mL/min (ref >60)
GFR, Estimated: >60 mL/min (ref >60)
Glucose, Bld: 83 mg/dL (ref 70–99)
Potassium: 3.7 mmol/L (ref 3.5–5.1)
Sodium: 136 mmol/L (ref 135–145)
Total Bilirubin: 0.6 mg/dL (ref 0.3–1.2)
Total Protein: 6.9 g/dL (ref 6.5–8.1)

## 2018-10-17 MED ORDER — HEPARIN SOD (PORK) LOCK FLUSH 100 UNIT/ML IV SOLN
500.0000 [IU] | Freq: Once | INTRAVENOUS | Status: AC
  Administered 2018-10-17: 500 [IU] via INTRAVENOUS
  Filled 2018-10-17: qty 5

## 2018-10-17 MED ORDER — SODIUM CHLORIDE 0.9% FLUSH
10.0000 mL | INTRAVENOUS | Status: DC | PRN
  Administered 2018-10-17: 10 mL via INTRAVENOUS
  Filled 2018-10-17: qty 10

## 2018-10-17 NOTE — Progress Notes (Signed)
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  She is not a candidate for immunotherapy.  Biomarker Findings Tumor Mutational Burden - 18 Muts/Mb Microsatellite status - MS-Stable Genomic Findings For a complete list of the genes assayed, please refer to the Appendix. BRAF T241M NF1 W2075*, V1035f*3 BCORL1 RP591MB*84STAG2 splice site 26659+9J>TTERT promoter -124C>T 7 Disease relevant genes with no reportable alterations: ALK, EGFR, ERBB2, KRAS, MET, RET, ROS1  PD-L1: 1%.  PRIOR THERAPY: None  CURRENT THERAPY: Systemic chemotherapy with carboplatin for AUC of 5, Alimta 500 mg/M2 and a Avastin 15 mg/KG every 3 weeks.  First dose October 10, 2018. Status post 1 cycle  INTERVAL HISTORY: Donna SHEVCHENKO78y.o. female returns to the clinic today accompanied by her ex-husband, Donna Mcneil  She received her first treatment last week and tolerated it fairly well without any adverse effects except for mild fatigue and a minimally dimished appetite without weight loss.  The patient is feeling fine today without any specific complaints. She had a port placed yesterday and had some nausea after the procedure for which she took Compazine with relief. The patient continues to endorse baseline shortness of breath and cough but denies any chest pain or hemoptysis.  She is on 3 L of oxygen nasal cannula at home.  She denies any headaches or visual changes.  She denies any fevers, chills, night sweats, or weight loss.  She denies any nausea, vomiting, diarrhea, constipation.  She  is here today for a follow-up visit after completing her first cycle of treatment last week.   MEDICAL HISTORY: Past Medical History:  Diagnosis Date  . [place]  . Diverticulitis   . Hypercholesteremia   . Hypertension   . Lupus ([facility]   . Osteoporosis   . Tobacco use     ALLERGIES:  is allergic to codeine; cyclosporine; other; pregabalin; and zolpidem.  MEDICATIONS:  Current Outpatient Medications  Medication Sig Dispense Refill  . acetaminophen (TYLENOL) 500 MG tablet Take 1,000 mg by mouth every 6 (six) hours as needed for mild pain.     .Marland Kitchenalbuterol (PROVENTIL) (2.5 MG/3ML) 0.083% nebulizer solution Take 3 mLs (2.5 mg total) by nebulization every 4 (four) hours as needed for wheezing or shortness of breath. 180 mL 2  . ALPRAZolam (XANAX) 0.5 MG tablet TAKE 1 TABLET BY MOUTH TWICE A DAY AS NEEDED (Patient taking differently: Take 0.5 mg by mouth 2 (two) times daily. ) 60 tablet 0  . amLODipine (NORVASC) 10 MG tablet TAKE 1 TABLET BY MOUTH ONCE DAILY 30 tablet 6  . aspirin 81 MG tablet Take 81 mg by mouth daily.    .Marland Kitchenatorvastatin (LIPITOR) 40 MG tablet TAKE 1 TABLET BY MOUTH ONCE DAILY (Patient taking differently: Take 40 mg by mouth every evening. ) 90 tablet 3  . b complex vitamins tablet Take 2 tablets by mouth 2 (two) times daily.    . Biotin (BIOTIN 5000) 5 MG CAPS Take 5,000 mg by mouth daily.    . Calcium Carbonate-Vitamin D (CALCIUM 600+D) 600-200 MG-UNIT TABS Take 1 tablet by mouth 2 (two) times daily.     .Marland Kitchen  carboxymethylcellulose (REFRESH TEARS) 0.5 % SOLN Place 1 drop into both eyes daily as needed (dry eyes).     . Coenzyme Q10 (COQ10) 100 MG CAPS Take 100 mg by mouth daily.    . folic acid (FOLVITE) 1 MG tablet Take 1 tablet (1 mg total) by mouth daily. 30 tablet 4  . gabapentin (NEURONTIN) 100 MG capsule Take 100 mg by mouth 3 (three) times daily.     Marland Kitchen guaiFENesin (MUCINEX) 600 MG 12 hr tablet Take 1 tablet (600 mg total) by mouth 2 (two) times  daily. 14 tablet 0  . hydrochlorothiazide (HYDRODIURIL) 50 MG tablet TAKE 1 TABLET BY MOUTH ONCE A DAY (Patient taking differently: Take 50 mg by mouth daily. ) 30 tablet 12  . hydroxychloroquine (PLAQUENIL) 200 MG tablet Take 200 mg by mouth daily.     Javier Docker Oil 500 MG CAPS Take 500 mg by mouth daily.    Marland Kitchen lidocaine-prilocaine (EMLA) cream Apply 1 application topically as needed. 30 g 0  . methotrexate (RHEUMATREX) 2.5 MG tablet Take 15 mg by mouth every Monday.     . Multiple Vitamins-Minerals (MULTIVITAMIN ADULT PO) Take 1 tablet by mouth daily.     . Omega 3 1000 MG CAPS Take 1,000-2,000 mg by mouth See admin instructions. Take 2000 mg in the morning and 1000 mg at night    . Potassium 99 MG TABS Take 99 mg by mouth daily.     Marland Kitchen PROAIR HFA 108 (90 Base) MCG/ACT inhaler Inhale 2 puffs into the lungs every 6 (six) hours as needed for wheezing or shortness of breath. 3 Inhaler 2  . prochlorperazine (COMPAZINE) 10 MG tablet Take 1 tablet (10 mg total) by mouth every 6 (six) hours as needed for nausea or vomiting. 30 tablet 0  . senna-docusate (SENOKOT-S) 8.6-50 MG tablet Take 1 tablet by mouth 2 (two) times daily.    Marland Kitchen umeclidinium-vilanterol (ANORO ELLIPTA) 62.5-25 MCG/INH AEPB Inhale 1 puff into the lungs daily. 60 each 3  . Vitamin D, Cholecalciferol, 1000 units TABS Take 1,000 Units by mouth at bedtime.      No current facility-administered medications for this visit.    Facility-Administered Medications Ordered in Other Visits  Medication Dose Route Frequency Provider Last Rate Last Dose  . sodium chloride flush (NS) 0.9 % injection 10 mL  10 mL Intravenous PRN Curt Bears, MD   10 mL at 10/17/18 1052    SURGICAL HISTORY:  Past Surgical History:  Procedure Laterality Date  . ABDOMINAL HYSTERECTOMY  12/1969  . APPENDECTOMY  1957  . CATARACT EXTRACTION    . CHOLECYSTECTOMY  1997  . IR IMAGING GUIDED PORT INSERTION  10/16/2018  . LAPAROSCOPIC SIGMOID COLECTOMY  2013   secondary  to diverticulitis  . spit seed removal    . TONSILLECTOMY  1945    REVIEW OF SYSTEMS:   Review of Systems  Constitutional: Positive minimally diminished appetite. Negative for chills, fatigue, fever and unexpected weight change.  HENT:   Negative for mouth sores, nosebleeds, sore throat and trouble swallowing.   Eyes: Negative for eye problems and icterus.  Respiratory: Positive for baseline cough and shortness of breath with exertion. Negative for hemoptysis and wheezing.  Cardiovascular: Positive for baseline leg swelling. Negative for chest pain. Gastrointestinal: Negative for abdominal pain, constipation, diarrhea, nausea and vomiting.  Genitourinary: Negative for bladder incontinence, difficulty urinating, dysuria, frequency and hematuria.   Musculoskeletal: Negative for back pain, gait problem, neck pain and neck stiffness.  Skin: Negative  for itching and rash.  Neurological: Negative for dizziness, extremity weakness, gait problem, headaches, light-headedness and seizures.  Hematological: Negative for adenopathy. Does not bruise/bleed easily.  Psychiatric/Behavioral: Negative for confusion, depression and sleep disturbance. The patient is not nervous/anxious.     PHYSICAL EXAMINATION:  Blood pressure (!) 152/71, pulse 91, temperature 98.8 F (37.1 C), temperature source Oral, resp. rate 20, height '5\' 2"'  (1.575 m), weight 137 lb 8 oz (62.4 kg), SpO2 98 %.  ECOG PERFORMANCE STATUS: 1 - Symptomatic but completely ambulatory  Physical Exam  Constitutional: Oriented to person, place, and time and well-developed, well-nourished, and in no distress. No distress.  HENT:  Head: Normocephalic and atraumatic.  Mouth/Throat: Oropharynx is clear and moist. No oropharyngeal exudate.  Eyes: Conjunctivae are normal. Right eye exhibits no discharge. Left eye exhibits no discharge. No scleral icterus.  Neck: Normal range of motion. Neck supple.  Cardiovascular: Normal rate, regular rhythm,  normal heart sounds and intact distal pulses.   Pulmonary/Chest: Diminished breath sounds on the lower left lung base with dullness to percussion. Repiratory effort normal. No respiratory distress. No wheezes. No rales.  Abdominal: Soft. Bowel sounds are normal. Exhibits no distension and no mass. There is no tenderness.  Musculoskeletal: Positive edema. Normal range of motion. Lymphadenopathy:    No cervical adenopathy.  Neurological: Alert and oriented to person, place, and time. Exhibits normal muscle tone. Gait normal. Coordination normal.  Skin: Skin is warm and dry. No rash noted. Not diaphoretic. No erythema. No pallor.  Psychiatric: Mood, memory and judgment normal.  Vitals reviewed.  LABORATORY DATA: Lab Results  Component Value Date   WBC 5.6 10/17/2018   HGB 10.4 (L) 10/17/2018   HCT 30.7 (L) 10/17/2018   MCV 90.3 10/17/2018   PLT 211 10/17/2018      Chemistry      Component Value Date/Time   NA 136 10/17/2018 0952   NA 136 05/14/2018 1225   NA 134 (L) 04/23/2012 0640   K 3.7 10/17/2018 0952   K 2.8 (L) 04/23/2012 0640   CL 97 (L) 10/17/2018 0952   CL 91 (L) 04/23/2012 0640   CO2 29 10/17/2018 0952   CO2 32 04/23/2012 0640   BUN 9 10/17/2018 0952   BUN 8 05/14/2018 1225   BUN 13 04/23/2012 0640   CREATININE 0.63 10/17/2018 0952   CREATININE 0.72 07/04/2017 1045      Component Value Date/Time   CALCIUM 9.9 10/17/2018 0952   CALCIUM 8.3 (L) 04/23/2012 0640   ALKPHOS 72 10/17/2018 0952   ALKPHOS 54 04/05/2012 0957   AST 28 10/17/2018 0952   ALT 21 10/17/2018 0952   ALT 23 04/05/2012 0957   BILITOT 0.6 10/17/2018 0952       RADIOGRAPHIC STUDIES:  Nm Pet Image Initial (pi) Skull Base To Thigh  Result Date: 09/20/2018 CLINICAL DATA:  Initial treatment strategy for LEFT lung cancer. Malignant effusion. EXAM: NUCLEAR MEDICINE PET SKULL BASE TO THIGH TECHNIQUE: 7.24 mCi F-18 FDG was injected intravenously. Full-ring PET imaging was performed from the skull  base to thigh after the radiotracer. CT data was obtained and used for attenuation correction and anatomic localization. Fasting blood glucose: 102 mg/dl COMPARISON:  None. FINDINGS: Mediastinal blood pool activity: SUV max 2.24 NECK: Small nodule in the posterior aspect of LEFT parotid gland SUV max equal 5.0. This nodule measures 9 mm on image 25/4. Incidental CT findings: none CHEST: There is a rind of hypermetabolic activity involving the near entirety of the LEFT pleural space.  This activity is most intense at the base and apex. For example hypermetabolic activity of the LEFT apical pleura with SUV max equal 8.2. Along the lateral margin the LEFT upper lobe, anterior to the fissure, there is a discrete hypermetabolic nodule measuring 1.7 cm with intense metabolic activity (SUV max equal 11.1). There is a hypermetabolic LEFT hilar lymph node with SUV max equal 12.3. Additional hypermetabolic subcarinal node which is hypermetabolic with SUV max equal 16.1 No hypermetabolic supraclavicular nodes. No hypermetabolic pulmonary nodules on the RIGHT. Incidental CT findings: Coronary artery calcification and aortic atherosclerotic calcification. ABDOMEN/PELVIS: No abnormal hypermetabolic activity within the liver, pancreas, adrenal glands, or spleen. No hypermetabolic lymph nodes in the abdomen or pelvis. Low-density collection along the sigmoid colon with peripheral calcifications (image 148/4 ) does not have metabolic activity. Incidental CT findings: none SKELETON: No focal hypermetabolic activity to suggest skeletal metastasis. Incidental CT findings: none IMPRESSION: 1. Hypermetabolic nodule in the lateral aspect of the LEFT upper lobe consists with primary bronchogenic carcinoma. 2. Ipsilateral hilar hypermetabolic nodal metastasis and subcarinal mediastinal nodal metastasis. 3. Diffuse metabolic activity associated with the LEFT pleural surface is most concerning for diffuse pleural metastasis in the LEFT  hemithorax. 4. No evidence of metastatic disease outside of the LEFT hemithorax or mediastinum. 5. 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 Consider ENT consultation at some point following lung cancer evaluation / treatment. Electronically Signed   By: Suzy Bouchard M.D.   On: 09/20/2018 15:35   Ct Biopsy  Addendum Date: 10/03/2018   ADDENDUM REPORT: 10/03/2018 14:02 ADDENDUM: The original report was by Dr. Sandi Mariscal. The following addendum is by Dr. Van Clines: This is a brief correction of Dr. Pascal Lux' prior report, impression # 1 should read, "Technically successful CT guided core needle biopsy of dominant hypermetabolic LEFT upper lobe/pleural nodule." Electronically Signed   By: Van Clines M.D.   On: 10/03/2018 14:02   Result Date: 10/03/2018 INDICATION: Hypermetabolic left-sided pleural nodularity worrisome for malignancy. Please perform CT-guided biopsy for tissue diagnostic purposes. EXAM: CT GUIDED BIOPSY OF DOMINANT HYPERMETABOLIC LEFT UPPER LOBE/PLEURAL NODULE COMPARISON:  PET-CT-09/20/2018 MEDICATIONS: None. ANESTHESIA/SEDATION: Fentanyl 50 mcg IV; Versed 2 mg IV Sedation time: 35 minutes; The patient was continuously monitored during the procedure by the interventional radiology nurse under my direct supervision. CONTRAST:  None COMPLICATIONS: None immediate. PROCEDURE: Informed consent was obtained from the patient following an explanation of the procedure, risks, benefits and alternatives. The patient understands,agrees and consents for the procedure. All questions were addressed. A time out was performed prior to the initiation of the procedure. The patient was positioned supine, slightly RPO on the CT table and a limited chest CT was performed for procedural planning demonstrating unchanged size and appearance the approximately 2.2 x 1.8 cm hypermetabolic subpleural  nodule within the right upper lobe (image 14, series 3). The operative site was prepped and draped in the usual sterile fashion. Under sterile conditions and local anesthesia, a 17 gauge coaxial needle was advanced into the peripheral aspect of the nodule. Positioning was confirmed with intermittent CT fluoroscopy and followed by the acquisition of 5 core needle biopsies with an 18 gauge core needle biopsy device. The coaxial needle was removed following deployment of a Biosentry plug and superficial hemostasis was achieved with manual compression. Limited post procedural chest CT was negative for pneumothorax or additional complication. A dressing was placed. The patient tolerated the procedure  well without immediate postprocedural complication. The patient was escorted to have an upright chest radiograph. Postprocedural imaging demonstrates worsening pleural nodularity about the medial basilar segment of the left lower lobe with dominant pleural nodule now measuring approximately 2.0 x 1.5 cm and adjacent pleural nodule measuring approximately 1.5 x 0.8 cm (both nodules seen on image 17, series 2), neither of which were seen on preceding PET-CT performed 09/20/2018. IMPRESSION: 1. Technically successful CT guided core needle core biopsy of dominant hypermetabolic right upper lobe/pleural nodule. 2. Worsening pleural nodularity involving the medial basilar segment of the left lower lobe. Electronically Signed: By: Sandi Mariscal M.D. On: 10/01/2018 13:44   Dg Chest Port 1 View  Result Date: 10/01/2018 CLINICAL DATA:  Post left lung biopsy EXAM: PORTABLE CHEST 1 VIEW COMPARISON:  PET 09/20/2018, chest x-ray 09/12/2018 FINDINGS: Diffuse pleural thickening on the left is unchanged with consolidation left lower lobe. No pneumothorax post left lung biopsy COPD with hyperinflation of the right lung which is clear. Atherosclerotic aortic arch. IMPRESSION: No acute complication post needle biopsy left lung nodule.  Electronically Signed   By: Franchot Gallo M.D.   On: 10/01/2018 12:43   Ir Imaging Guided Port Insertion  Result Date: 10/16/2018 CLINICAL DATA:  Adenocarcinoma of left lung, stage IV. Needs durable venous access for planned chemotherapy regimen EXAM: TUNNELED PORT CATHETER PLACEMENT WITH ULTRASOUND AND FLUOROSCOPIC GUIDANCE FLUOROSCOPY TIME:  0.1 minute; 11 uGym2 DAP ANESTHESIA/SEDATION: Intravenous Fentanyl and Versed were administered as conscious sedation during continuous monitoring of the patient's level of consciousness and physiological / cardiorespiratory status by the radiology RN, with a total moderate sedation time of 15 minutes. TECHNIQUE: The procedure, risks, benefits, and alternatives were explained to the patient. Questions regarding the procedure were encouraged and answered. The patient understands and consents to the procedure. As antibiotic prophylaxis, cefazolin 2 g was ordered pre-procedure and administered intravenously within one hour of incision. Patency of the right IJ vein was confirmed with ultrasound with image documentation. An appropriate skin site was determined. Skin site was marked. Region was prepped using maximum barrier technique including cap and mask, sterile gown, sterile gloves, large sterile sheet, and Chlorhexidine as cutaneous antisepsis. The region was infiltrated locally with 1% lidocaine. Under real-time ultrasound guidance, the right IJ vein was accessed with a 21 gauge micropuncture needle; the needle tip within the vein was confirmed with ultrasound image documentation. Needle was exchanged over a 018 guidewire for transitional dilator which allowed passage of the [facility] wire into the IVC. Over this, the transitional dilator was exchanged for a 5 [place] MPA catheter. A small incision was made on the right anterior chest wall and a subcutaneous pocket fashioned. The power-injectable port was positioned and its catheter tunneled to the right IJ dermatotomy site.  The MPA catheter was exchanged over an Amplatz wire for a peel-away sheath, through which the port catheter, which had been trimmed to the appropriate length, was advanced and positioned under fluoroscopy with its tip at the cavoatrial junction. Spot chest radiograph confirms good catheter position and no pneumothorax. The pocket was closed with deep interrupted and subcuticular continuous 3-0 Monocryl sutures. The port was flushed per protocol. The incisions were covered with Dermabond then covered with a sterile dressing. COMPLICATIONS: COMPLICATIONS None immediate IMPRESSION: Technically successful right IJ power-injectable port catheter placement. Ready for routine use. Electronically Signed   By: Lucrezia Europe M.D.   On: 10/16/2018 15:56     ASSESSMENT/PLAN:  This is a very pleasant 78 year old Caucasian female recently  diagnosed with stage IV (T1b, N2, M1A) non-small cell lung cancer. Pathology consistent with  adenocarcinoma of the left lower lobe with left hilar and mediastinal lymphadenopathy. Additionally, found to have a left malignant pleural effusion with pleural base metastasis diagnosed in January 2020. [employer]ant 360 testing showed no actionable mutations.   The patient is currently undergoing palliative systemic chemotherapy with carboplatin for an AUC of 5, on Alimta 52m/m and Avastin 15 mg/kg every 3 weeks.  The patient is not a candidate for immunotherapy due to her history of lupus and rheumatoid arthritis.  First dose was administered on October 10, 2018.  Patient is status post 1 cycle.   She was seen with Dr. MJulien Nordmanntoday.  She tolerated her first treatment well without any concerning adverse effects except for some fatigue and mild appetite changes.   Recommend for her to proceed with cycle #2 in 2 weeks.  We will see her for evaluation in 2 weeks prior to starting her next treatment.   The patient had a Port-A-Cath placed yesterday and tolerated the procedure well except for  some nausea after the procedure which was relieved with Compazine.  She is feeling better today.   The patient states that at her previous hospitalizations, she choose to change her code status to DNR. The patient, her ex-husband and I had a lengthy discussion regarding her code status. In the event of cardiac or respiratory arrest, the patient does not wish to undergo potentially life saving interventions such as CPR/ACLS, intubation, or defibrillation. She requested a DNR sheet to keep at home which was signed and provided.   The patient was advised to call immediately if she has any concerning symptoms in the interval. The patient voices understanding of current disease status and treatment options and is in agreement with the current care plan. All questions were answered. The patient knows to call the clinic with any problems, questions or concerns. We can certainly see the patient much sooner if necessary   Orders Placed This Encounter  Procedures  . DNR (Do Not Resuscitate)    Order Specific Question:   In the event of cardiac or respiratory ARREST    Answer:   Do not call a "code blue"    Order Specific Question:   In the event of cardiac or respiratory ARREST    Answer:   Do not perform Intubation, CPR, defibrillation or ACLS    Order Specific Question:   In the event of cardiac or respiratory ARREST    Answer:   Use medication by any route, position, wound care, and other measures to relive pain and suffering. May use oxygen, suction and manual treatment of airway obstruction as needed for comfort.     Donna Givler L Charlann Wayne, PA-C 10/17/18  ADDENDUM: Hematology/Oncology Attending: I had a face-to-face encounter with the patient today.  I recommended her care plan.  This is a very pleasant 78years old white female recently diagnosed with metastatic non-small cell lung cancer, adenocarcinoma with no actionable mutations.  She has PDL 1 expression of 1%.  The patient is currently  undergoing systemic chemotherapy with carboplatin, Alimta and Avastin status post 1 cycle given last week.  She is not a good candidate for immunotherapy because of her history of lupus and rheumatoid arthritis. The patient tolerated the first week of her treatment well except for fatigue and mild nausea.  She had a Port-A-Cath placed yesterday. I recommended for the patient to continue her current treatment and she will come  back for follow-up visit in 2 weeks for evaluation before starting cycle #2. She was advised to call immediately if she has any concerning symptoms in the interval.  Disclaimer: This note was dictated with voice recognition software. Similar sounding words can inadvertently be transcribed and may be missed upon review. Eilleen Kempf, MD 10/17/18

## 2018-10-17 NOTE — Telephone Encounter (Signed)
Scheduled appt per 2/19 los - added appt per 2/19 los - pt to get an updated schedule next visit.

## 2018-10-24 ENCOUNTER — Inpatient Hospital Stay: Payer: Medicare Other

## 2018-10-24 DIAGNOSIS — Z5112 Encounter for antineoplastic immunotherapy: Secondary | ICD-10-CM | POA: Diagnosis not present

## 2018-10-24 DIAGNOSIS — Z79899 Other long term (current) drug therapy: Secondary | ICD-10-CM | POA: Diagnosis not present

## 2018-10-24 DIAGNOSIS — C771 Secondary and unspecified malignant neoplasm of intrathoracic lymph nodes: Secondary | ICD-10-CM | POA: Diagnosis not present

## 2018-10-24 DIAGNOSIS — M329 Systemic lupus erythematosus, unspecified: Secondary | ICD-10-CM | POA: Diagnosis not present

## 2018-10-24 DIAGNOSIS — I700 Atherosclerosis of aorta: Secondary | ICD-10-CM | POA: Diagnosis not present

## 2018-10-24 DIAGNOSIS — C3492 Malignant neoplasm of unspecified part of left bronchus or lung: Secondary | ICD-10-CM

## 2018-10-24 DIAGNOSIS — Z95828 Presence of other vascular implants and grafts: Secondary | ICD-10-CM | POA: Insufficient documentation

## 2018-10-24 DIAGNOSIS — M069 Rheumatoid arthritis, unspecified: Secondary | ICD-10-CM | POA: Diagnosis not present

## 2018-10-24 DIAGNOSIS — Z7982 Long term (current) use of aspirin: Secondary | ICD-10-CM | POA: Diagnosis not present

## 2018-10-24 DIAGNOSIS — M810 Age-related osteoporosis without current pathological fracture: Secondary | ICD-10-CM | POA: Diagnosis not present

## 2018-10-24 DIAGNOSIS — C782 Secondary malignant neoplasm of pleura: Secondary | ICD-10-CM | POA: Diagnosis not present

## 2018-10-24 DIAGNOSIS — C3412 Malignant neoplasm of upper lobe, left bronchus or lung: Secondary | ICD-10-CM | POA: Diagnosis not present

## 2018-10-24 DIAGNOSIS — I10 Essential (primary) hypertension: Secondary | ICD-10-CM | POA: Diagnosis not present

## 2018-10-24 DIAGNOSIS — J449 Chronic obstructive pulmonary disease, unspecified: Secondary | ICD-10-CM | POA: Diagnosis not present

## 2018-10-24 DIAGNOSIS — Z5111 Encounter for antineoplastic chemotherapy: Secondary | ICD-10-CM | POA: Diagnosis not present

## 2018-10-24 DIAGNOSIS — E7800 Pure hypercholesterolemia, unspecified: Secondary | ICD-10-CM | POA: Diagnosis not present

## 2018-10-24 DIAGNOSIS — J910 Malignant pleural effusion: Secondary | ICD-10-CM | POA: Diagnosis not present

## 2018-10-24 DIAGNOSIS — R110 Nausea: Secondary | ICD-10-CM | POA: Diagnosis not present

## 2018-10-24 LAB — CBC WITH DIFFERENTIAL (CANCER CENTER ONLY)
Abs Immature Granulocytes: 0.01 10*3/uL (ref 0.00–0.07)
BASOS PCT: 1 %
Basophils Absolute: 0 10*3/uL (ref 0.0–0.1)
Eosinophils Absolute: 0.1 10*3/uL (ref 0.0–0.5)
Eosinophils Relative: 2 %
HCT: 30.9 % — ABNORMAL LOW (ref 36.0–46.0)
Hemoglobin: 10.6 g/dL — ABNORMAL LOW (ref 12.0–15.0)
Immature Granulocytes: 0 %
Lymphocytes Relative: 34 %
Lymphs Abs: 1.9 10*3/uL (ref 0.7–4.0)
MCH: 30.9 pg (ref 26.0–34.0)
MCHC: 34.3 g/dL (ref 30.0–36.0)
MCV: 90.1 fL (ref 80.0–100.0)
Monocytes Absolute: 0.6 10*3/uL (ref 0.1–1.0)
Monocytes Relative: 10 %
Neutro Abs: 3.1 10*3/uL (ref 1.7–7.7)
Neutrophils Relative %: 53 %
Platelet Count: 179 10*3/uL (ref 150–400)
RBC: 3.43 MIL/uL — AB (ref 3.87–5.11)
RDW: 14 % (ref 11.5–15.5)
WBC Count: 5.7 10*3/uL (ref 4.0–10.5)
nRBC: 0 % (ref 0.0–0.2)

## 2018-10-24 LAB — CMP (CANCER CENTER ONLY)
ALT: 22 U/L (ref 0–44)
AST: 26 U/L (ref 15–41)
Albumin: 2.6 g/dL — ABNORMAL LOW (ref 3.5–5.0)
Alkaline Phosphatase: 90 U/L (ref 38–126)
Anion gap: 10 (ref 5–15)
BUN: 11 mg/dL (ref 8–23)
CO2: 30 mmol/L (ref 22–32)
Calcium: 10 mg/dL (ref 8.9–10.3)
Chloride: 96 mmol/L — ABNORMAL LOW (ref 98–111)
Creatinine: 0.63 mg/dL (ref 0.44–1.00)
GFR, Est AFR Am: >60 mL/min (ref >60)
GFR, Estimated: >60 mL/min (ref >60)
Glucose, Bld: 81 mg/dL (ref 70–99)
Potassium: 3.3 mmol/L — ABNORMAL LOW (ref 3.5–5.1)
Sodium: 136 mmol/L (ref 135–145)
TOTAL PROTEIN: 7.3 g/dL (ref 6.5–8.1)
Total Bilirubin: 0.4 mg/dL (ref 0.3–1.2)

## 2018-10-24 MED ORDER — HEPARIN SOD (PORK) LOCK FLUSH 100 UNIT/ML IV SOLN
500.0000 [IU] | Freq: Once | INTRAVENOUS | Status: AC | PRN
  Administered 2018-10-24: 500 [IU]
  Filled 2018-10-24: qty 5

## 2018-10-24 MED ORDER — SODIUM CHLORIDE 0.9% FLUSH
10.0000 mL | INTRAVENOUS | Status: DC | PRN
  Administered 2018-10-24: 10 mL
  Filled 2018-10-24: qty 10

## 2018-10-30 ENCOUNTER — Encounter: Payer: Self-pay | Admitting: Pulmonary Disease

## 2018-10-30 ENCOUNTER — Ambulatory Visit (INDEPENDENT_AMBULATORY_CARE_PROVIDER_SITE_OTHER)
Admission: RE | Admit: 2018-10-30 | Discharge: 2018-10-30 | Disposition: A | Discharge status: Home / Self Care | Payer: Medicare Other | Source: Ambulatory Visit | Attending: Pulmonary Disease | Admitting: Pulmonary Disease

## 2018-10-30 ENCOUNTER — Ambulatory Visit (INDEPENDENT_AMBULATORY_CARE_PROVIDER_SITE_OTHER): Payer: Medicare Other | Admitting: Pulmonary Disease

## 2018-10-30 VITALS — BP 116/72 | HR 52 | Ht 62.0 in

## 2018-10-30 DIAGNOSIS — J90 Pleural effusion, not elsewhere classified: Secondary | ICD-10-CM | POA: Diagnosis not present

## 2018-10-30 DIAGNOSIS — R918 Other nonspecific abnormal finding of lung field: Secondary | ICD-10-CM | POA: Diagnosis not present

## 2018-10-30 DIAGNOSIS — C3492 Malignant neoplasm of unspecified part of left bronchus or lung: Secondary | ICD-10-CM

## 2018-10-30 DIAGNOSIS — M0579 Rheumatoid arthritis with rheumatoid factor of multiple sites without organ or systems involvement: Secondary | ICD-10-CM | POA: Diagnosis not present

## 2018-10-30 DIAGNOSIS — J9611 Chronic respiratory failure with hypoxia: Secondary | ICD-10-CM | POA: Diagnosis not present

## 2018-10-30 NOTE — Progress Notes (Signed)
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  Subjective:   PATIENT ID: Donna Mcneil GENDER: female DOB: [birth year], MRN: [id number]   HPI  Chief Complaint  Patient presents with  . Follow-up    F/U per [facility] for resp. failure. States breathing has been ok since last visit.    Donna Mcneil returns today for follow-up regarding her newly diagnosed left lung cancer, adenocarcinoma, severe COPD, chronic respiratory failure with hypoxemia.  She says that lately her breathing has been much better, particularly in the last 2 days.  She does not have much trouble with shortness of breath on a day-to-day basis but she does struggle with significant sinus congestion.  She says that this will make her feel a bit more short of breath.  However, she does not feel chest tightness wheezing or mucus production.  She has been using her oxygen consistently but will sometimes take it off to go to the bathroom or while eating.  She says that she continues to take her methotrexate regularly and she commonly feels weak after she takes that, she last took it yesterday.  She has been receiving chemotherapy for her adenocarcinoma and her last infusion was 2 weeks ago, the next is scheduled for next week.  Denies fevers or chills or sick contacts right now.  She is doing her best to avoid sick people and has been practicing good hand hygiene.  Past Medical History:  Diagnosis Date  . COPD exacerbation ([place]) 09/01/2018  . Diverticulitis   . Hypercholesteremia   . Hypertension   . Lupus ([place])   . Osteoporosis   . Tobacco use      Family History  Problem Relation Age of Onset  . Alcohol abuse Mother   . Cirrhosis Mother   . Heart disease Father   . Hypertension Father   . Gout Father   . Ulcers Father   . Hyperlipidemia Father   . Alcohol abuse Brother   . Heart disease Brother     MI at age 25  . OCD Son   . Prostatitis Paternal Grandfather   . Pneumonia Paternal Grandfather   . Cancer Son      Social History   Socioeconomic History  . Marital status: Divorced    Spouse name: Not on file  . Number of children: 2  . Years of education: Not on file  . Highest education level: High school graduate  Occupational History  . Occupation: retired  Scientific laboratory technician  . Financial resource strain: Not hard at all  . Food insecurity:    Worry: Never true    Inability: Never true  . Transportation needs:    Medical: No    Non-medical: No  Tobacco Use  . Smoking status: Former Smoker    Packs/day: 0.50    Types: Cigarettes    Last attempt to quit: 09/01/2018    Years since quitting: 0.1  . Smokeless tobacco: Never Used  . Tobacco comment: started smoking at age 3  Substance and Sexual Activity  . Alcohol use: No    Alcohol/week: 0.0 standard drinks  . Drug use: No  . Sexual activity: Not Currently  Lifestyle  . Physical activity:    Days per week: Not on file    Minutes per session: Not on file  . Stress: To some extent  Relationships  . Social connections:    Talks on  phone: Not on file    Gets together: Not on file    Attends religious service: Not on file    Active member of club or organization: Not on file    Attends meetings of clubs or organizations: Not on file    Relationship status: Not on file  . Intimate partner violence:    Fear of current or ex partner: Not on file    Emotionally abused: Not on file    Physically abused: Not on file    Forced sexual activity: Not on file  Other Topics Concern  . Not on file  Social History Narrative  . Not on file     Allergies  Allergen Reactions  . Codeine Other (See Comments)    GI upset  . Cyclosporine Other (See Comments)    Burning eyes  . Other     Other reaction(s): OTHER  . Pregabalin Other (See Comments)    Personality changes-becomes angry  . Zolpidem     Other reaction(s): ITCHING      Outpatient Medications Prior to Visit  Medication Sig Dispense Refill  . acetaminophen (TYLENOL) 500 MG tablet Take 1,000 mg by mouth every 6 (six) hours as needed for mild pain.     Marland Kitchen albuterol (PROVENTIL) (2.5 MG/3ML) 0.083% nebulizer solution Take 3 mLs (2.5 mg total) by nebulization every 4 (four) hours as needed for wheezing or shortness of breath. 180 mL 2  . ALPRAZolam (XANAX) 0.5 MG tablet TAKE 1 TABLET BY MOUTH TWICE A DAY AS NEEDED (Patient taking differently: Take 0.5 mg by mouth 2 (two) times daily. ) 60 tablet 0  . amLODipine (NORVASC) 10 MG tablet TAKE 1 TABLET BY MOUTH ONCE DAILY 30 tablet 6  . aspirin 81 MG tablet Take 81 mg by mouth daily.    Marland Kitchen atorvastatin (LIPITOR) 40 MG tablet TAKE 1 TABLET BY MOUTH ONCE DAILY (Patient taking differently: Take 40 mg by mouth every evening. ) 90 tablet 3  . b complex vitamins tablet Take 2 tablets by mouth 2 (two) times daily.    . Biotin (BIOTIN 5000) 5 MG CAPS Take 5,000 mg by mouth daily.    . Calcium Carbonate-Vitamin D (CALCIUM 600+D) 600-200 MG-UNIT TABS Take 1 tablet by mouth 2 (two) times daily.     . carboxymethylcellulose (REFRESH TEARS) 0.5 % SOLN Place 1 drop into both eyes daily as needed (dry eyes).     . Coenzyme Q10 (COQ10) 100 MG CAPS Take 100 mg by mouth daily.    . folic acid (FOLVITE) 1 MG tablet Take 1 tablet (1 mg total) by mouth daily. 30 tablet 4  . gabapentin (NEURONTIN) 100 MG capsule Take 100 mg by mouth 3 (three) times daily.     Marland Kitchen guaiFENesin (MUCINEX) 600 MG 12 hr tablet Take 1 tablet (600 mg total) by mouth 2 (two) times daily. 14 tablet 0  . hydrochlorothiazide (HYDRODIURIL) 50 MG tablet TAKE 1 TABLET BY MOUTH ONCE A DAY (Patient taking differently: Take 50 mg by mouth daily. ) 30 tablet 12  . hydroxychloroquine (PLAQUENIL) 200 MG tablet Take 200 mg by mouth daily.     Javier Docker Oil 500 MG CAPS Take 500 mg by mouth daily.    Marland Kitchen lidocaine-prilocaine (EMLA) cream Apply 1 application topically as needed. 30 g 0    . methotrexate (RHEUMATREX) 2.5 MG tablet Take 15 mg by mouth every Monday.     . Multiple Vitamins-Minerals (MULTIVITAMIN ADULT PO) Take 1 tablet by mouth daily.     Marland Kitchen  Omega 3 1000 MG CAPS Take 1,000-2,000 mg by mouth See admin instructions. Take 2000 mg in the morning and 1000 mg at night    . Potassium 99 MG TABS Take 99 mg by mouth daily.     Marland Kitchen PROAIR HFA 108 (90 Base) MCG/ACT inhaler Inhale 2 puffs into the lungs every 6 (six) hours as needed for wheezing or shortness of breath. 3 Inhaler 2  . prochlorperazine (COMPAZINE) 10 MG tablet Take 1 tablet (10 mg total) by mouth every 6 (six) hours as needed for nausea or vomiting. 30 tablet 0  . senna-docusate (SENOKOT-S) 8.6-50 MG tablet Take 1 tablet by mouth 2 (two) times daily.    Marland Kitchen umeclidinium-vilanterol (ANORO ELLIPTA) 62.5-25 MCG/INH AEPB Inhale 1 puff into the lungs daily. 60 each 3  . Vitamin D, Cholecalciferol, 1000 units TABS Take 1,000 Units by mouth at bedtime.      No facility-administered medications prior to visit.     Review of Systems  Constitutional: Positive for malaise/fatigue. Negative for diaphoresis, fever and weight loss.  HENT: Negative for congestion, nosebleeds and sinus pain.   Respiratory: Positive for shortness of breath. Negative for cough, hemoptysis, sputum production and stridor.   Cardiovascular: Negative for palpitations, orthopnea, claudication and leg swelling.  Skin: Negative for itching and rash.      Objective:  Physical Exam   Vitals:   10/30/18 1102  BP: 116/72  Pulse: (!) 52  SpO2: 98%  Height: 5\' 2"  (1.575 m)   3L   Gen: chronically ill appearing HENT: OP clear, TM's clear, neck supple PULM: Poor air movement B, normal percussion CV: RRR, no mgr, trace edema GI: BS+, soft, nontender Derm: no cyanosis or rash Psyche: normal mood and affect   CBC    Component Value Date/Time   WBC 5.7 10/24/2018 1145   WBC 8.2 10/16/2018 1300   RBC 3.43 (L) 10/24/2018 1145   HGB 10.6 (L)  10/24/2018 1145   HGB 15.1 05/14/2018 1225   HCT 30.9 (L) 10/24/2018 1145   HCT 41.0 05/14/2018 1225   PLT 179 10/24/2018 1145   PLT 224 05/14/2018 1225   MCV 90.1 10/24/2018 1145   MCV 91 05/14/2018 1225   MCV 93 04/23/2012 0640   MCH 30.9 10/24/2018 1145   MCHC 34.3 10/24/2018 1145   RDW 14.0 10/24/2018 1145   RDW 13.8 05/14/2018 1225   RDW 14.6 (H) 04/23/2012 0640   LYMPHSABS 1.9 10/24/2018 1145   LYMPHSABS 1.9 05/14/2018 1225   LYMPHSABS 0.5 (L) 04/23/2012 0640   MONOABS 0.6 10/24/2018 1145   MONOABS 0.7 04/23/2012 0640   EOSABS 0.1 10/24/2018 1145   EOSABS 0.1 05/14/2018 1225   EOSABS 0.0 04/23/2012 0640   BASOSABS 0.0 10/24/2018 1145   BASOSABS 0.1 05/14/2018 1225   BASOSABS 0.0 04/23/2012 0640     Chest imaging: 08/2018 PET CT > LUL hypermetabolic nodule likely bronchogenic carcinoma, ipsilateral adenopathy, left pleural hypermetabolism, no evidence of metastatic disease outside left hemithorax or mediastinum October 01, 2018 chest x-ray shows what appears to be a small effusion in the left, lung mass.  PFT:  Labs:  Path: 09/2018 left lung needle biopsy: adenocarcinoma  Echo:  Heart Catheterization:       Assessment & Plan:   Pleural effusion, left - Plan: DG Chest 2 View  Chronic respiratory failure with hypoxia (HCC)  Adenocarcinoma of left lung, stage 4 (HCC)  Rheumatoid arthritis involving multiple sites with positive rheumatoid factor (HCC)  Mass of upper lobe of left  lung  Discussion: Donna Mcneil feels fairly well right now, but she is having problems with allergic rhinitis because she has not been taking her allergy medicines.  I worry that her left pleural effusion could recur and by my review of the chest x-ray from February 3 I worry that this may have already happened.  Lung adenocarcinoma: Continue cancer treatment as directed by Dr. Earlie Server  Severe COPD: Continue Anoro 1 puff daily no matter how you feel Use albuterol as needed for  chest tightness wheezing or shortness of breath Practice good hand hygiene Stay active  Chronic respiratory failure with hypoxemia: Continue to use 2 to 3 L of oxygen continuously  Left pleural effusion: We will check a chest x-ray today  Allergic rhinitis: Use generic cetirizine 10 mg daily Use fluticasone (the generic for Flonase) 2 sprays each nostril daily no matter how you feel Keep using saline rinses  We will see you back in 2 to 3 months or sooner if needed  Greater than 50% of this 26-minute visit spent face-to-face  Current Outpatient Medications:  .  acetaminophen (TYLENOL) 500 MG tablet, Take 1,000 mg by mouth every 6 (six) hours as needed for mild pain. , Disp: , Rfl:  .  albuterol (PROVENTIL) (2.5 MG/3ML) 0.083% nebulizer solution, Take 3 mLs (2.5 mg total) by nebulization every 4 (four) hours as needed for wheezing or shortness of breath., Disp: 180 mL, Rfl: 2 .  ALPRAZolam (XANAX) 0.5 MG tablet, TAKE 1 TABLET BY MOUTH TWICE A DAY AS NEEDED (Patient taking differently: Take 0.5 mg by mouth 2 (two) times daily. ), Disp: 60 tablet, Rfl: 0 .  amLODipine (NORVASC) 10 MG tablet, TAKE 1 TABLET BY MOUTH ONCE DAILY, Disp: 30 tablet, Rfl: 6 .  aspirin 81 MG tablet, Take 81 mg by mouth daily., Disp: , Rfl:  .  atorvastatin (LIPITOR) 40 MG tablet, TAKE 1 TABLET BY MOUTH ONCE DAILY (Patient taking differently: Take 40 mg by mouth every evening. ), Disp: 90 tablet, Rfl: 3 .  b complex vitamins tablet, Take 2 tablets by mouth 2 (two) times daily., Disp: , Rfl:  .  Biotin (BIOTIN 5000) 5 MG CAPS, Take 5,000 mg by mouth daily., Disp: , Rfl:  .  Calcium Carbonate-Vitamin D (CALCIUM 600+D) 600-200 MG-UNIT TABS, Take 1 tablet by mouth 2 (two) times daily. , Disp: , Rfl:  .  carboxymethylcellulose (REFRESH TEARS) 0.5 % SOLN, Place 1 drop into both eyes daily as needed (dry eyes). , Disp: , Rfl:  .  Coenzyme Q10 (COQ10) 100 MG CAPS, Take 100 mg by mouth daily., Disp: , Rfl:  .  folic acid  (FOLVITE) 1 MG tablet, Take 1 tablet (1 mg total) by mouth daily., Disp: 30 tablet, Rfl: 4 .  gabapentin (NEURONTIN) 100 MG capsule, Take 100 mg by mouth 3 (three) times daily. , Disp: , Rfl:  .  guaiFENesin (MUCINEX) 600 MG 12 hr tablet, Take 1 tablet (600 mg total) by mouth 2 (two) times daily., Disp: 14 tablet, Rfl: 0 .  hydrochlorothiazide (HYDRODIURIL) 50 MG tablet, TAKE 1 TABLET BY MOUTH ONCE A DAY (Patient taking differently: Take 50 mg by mouth daily. ), Disp: 30 tablet, Rfl: 12 .  hydroxychloroquine (PLAQUENIL) 200 MG tablet, Take 200 mg by mouth daily. , Disp: , Rfl:  .  Krill Oil 500 MG CAPS, Take 500 mg by mouth daily., Disp: , Rfl:  .  lidocaine-prilocaine (EMLA) cream, Apply 1 application topically as needed., Disp: 30 g, Rfl: 0 .  methotrexate (RHEUMATREX) 2.5 MG tablet, Take 15 mg by mouth every Monday. , Disp: , Rfl:  .  Multiple Vitamins-Minerals (MULTIVITAMIN ADULT PO), Take 1 tablet by mouth daily. , Disp: , Rfl:  .  Omega 3 1000 MG CAPS, Take 1,000-2,000 mg by mouth See admin instructions. Take 2000 mg in the morning and 1000 mg at night, Disp: , Rfl:  .  Potassium 99 MG TABS, Take 99 mg by mouth daily. , Disp: , Rfl:  .  PROAIR HFA 108 (90 Base) MCG/ACT inhaler, Inhale 2 puffs into the lungs every 6 (six) hours as needed for wheezing or shortness of breath., Disp: 3 Inhaler, Rfl: 2 .  prochlorperazine (COMPAZINE) 10 MG tablet, Take 1 tablet (10 mg total) by mouth every 6 (six) hours as needed for nausea or vomiting., Disp: 30 tablet, Rfl: 0 .  senna-docusate (SENOKOT-S) 8.6-50 MG tablet, Take 1 tablet by mouth 2 (two) times daily., Disp: , Rfl:  .  umeclidinium-vilanterol (ANORO ELLIPTA) 62.5-25 MCG/INH AEPB, Inhale 1 puff into the lungs daily., Disp: 60 each, Rfl: 3 .  Vitamin D, Cholecalciferol, 1000 units TABS, Take 1,000 Units by mouth at bedtime. , Disp: , Rfl:

## 2018-10-30 NOTE — Patient Instructions (Signed)
Lung adenocarcinoma: Continue cancer treatment as directed by Dr. Earlie Server  Severe COPD: Continue Anoro 1 puff daily no matter how you feel Use albuterol as needed for chest tightness wheezing or shortness of breath Practice good hand hygiene Stay active  Chronic respiratory failure with hypoxemia: Continue to use 2 to 3 L of oxygen continuously  Left pleural effusion: We will check a chest x-ray today  Allergic rhinitis: Use generic cetirizine 10 mg daily Use fluticasone (the generic for Flonase) 2 sprays each nostril daily no matter how you feel Keep using saline rinses  We will see you back in 2 to 3 months or sooner if needed

## 2018-10-31 DIAGNOSIS — Z79899 Other long term (current) drug therapy: Secondary | ICD-10-CM | POA: Diagnosis not present

## 2018-11-01 ENCOUNTER — Inpatient Hospital Stay: Payer: Medicare Other

## 2018-11-01 ENCOUNTER — Inpatient Hospital Stay: Payer: Medicare Other | Attending: Internal Medicine | Admitting: Internal Medicine

## 2018-11-01 ENCOUNTER — Encounter: Payer: Self-pay | Admitting: Internal Medicine

## 2018-11-01 ENCOUNTER — Telehealth: Payer: Self-pay | Admitting: Internal Medicine

## 2018-11-01 VITALS — BP 125/80 | HR 53 | Temp 97.9°F | Resp 18 | Ht 62.0 in | Wt 129.3 lb

## 2018-11-01 DIAGNOSIS — M0579 Rheumatoid arthritis with rheumatoid factor of multiple sites without organ or systems involvement: Secondary | ICD-10-CM

## 2018-11-01 DIAGNOSIS — E7800 Pure hypercholesterolemia, unspecified: Secondary | ICD-10-CM | POA: Diagnosis not present

## 2018-11-01 DIAGNOSIS — Z95828 Presence of other vascular implants and grafts: Secondary | ICD-10-CM

## 2018-11-01 DIAGNOSIS — C3492 Malignant neoplasm of unspecified part of left bronchus or lung: Secondary | ICD-10-CM

## 2018-11-01 DIAGNOSIS — L932 Other local lupus erythematosus: Secondary | ICD-10-CM

## 2018-11-01 DIAGNOSIS — R5383 Other fatigue: Secondary | ICD-10-CM | POA: Diagnosis not present

## 2018-11-01 DIAGNOSIS — M069 Rheumatoid arthritis, unspecified: Secondary | ICD-10-CM | POA: Diagnosis not present

## 2018-11-01 DIAGNOSIS — Z5112 Encounter for antineoplastic immunotherapy: Secondary | ICD-10-CM | POA: Diagnosis not present

## 2018-11-01 DIAGNOSIS — Z8719 Personal history of other diseases of the digestive system: Secondary | ICD-10-CM | POA: Insufficient documentation

## 2018-11-01 DIAGNOSIS — M810 Age-related osteoporosis without current pathological fracture: Secondary | ICD-10-CM | POA: Diagnosis not present

## 2018-11-01 DIAGNOSIS — J449 Chronic obstructive pulmonary disease, unspecified: Secondary | ICD-10-CM | POA: Insufficient documentation

## 2018-11-01 DIAGNOSIS — C3412 Malignant neoplasm of upper lobe, left bronchus or lung: Secondary | ICD-10-CM | POA: Diagnosis not present

## 2018-11-01 DIAGNOSIS — I10 Essential (primary) hypertension: Secondary | ICD-10-CM | POA: Diagnosis not present

## 2018-11-01 DIAGNOSIS — Z5111 Encounter for antineoplastic chemotherapy: Secondary | ICD-10-CM | POA: Diagnosis not present

## 2018-11-01 DIAGNOSIS — C778 Secondary and unspecified malignant neoplasm of lymph nodes of multiple regions: Secondary | ICD-10-CM | POA: Insufficient documentation

## 2018-11-01 DIAGNOSIS — F17210 Nicotine dependence, cigarettes, uncomplicated: Secondary | ICD-10-CM | POA: Diagnosis not present

## 2018-11-01 DIAGNOSIS — C782 Secondary malignant neoplasm of pleura: Secondary | ICD-10-CM | POA: Diagnosis not present

## 2018-11-01 DIAGNOSIS — Z7982 Long term (current) use of aspirin: Secondary | ICD-10-CM | POA: Insufficient documentation

## 2018-11-01 DIAGNOSIS — Z79899 Other long term (current) drug therapy: Secondary | ICD-10-CM | POA: Insufficient documentation

## 2018-11-01 DIAGNOSIS — M329 Systemic lupus erythematosus, unspecified: Secondary | ICD-10-CM | POA: Diagnosis not present

## 2018-11-01 LAB — COMPREHENSIVE METABOLIC PANEL
ALK PHOS: 162 U/L — AB (ref 38–126)
ALT: 30 U/L (ref 0–44)
AST: 20 U/L (ref 15–41)
Albumin: 2.4 g/dL — ABNORMAL LOW (ref 3.5–5.0)
Anion gap: 12 (ref 5–15)
BUN: 10 mg/dL (ref 8–23)
CO2: 26 mmol/L (ref 22–32)
Calcium: 9.5 mg/dL (ref 8.9–10.3)
Chloride: 96 mmol/L — ABNORMAL LOW (ref 98–111)
Creatinine, Ser: 0.65 mg/dL (ref 0.44–1.00)
GFR calc Af Amer: >60 mL/min (ref >60)
GFR calc non Af Amer: >60 mL/min (ref >60)
GLUCOSE: 98 mg/dL (ref 70–99)
Potassium: 3.1 mmol/L — ABNORMAL LOW (ref 3.5–5.1)
Sodium: 134 mmol/L — ABNORMAL LOW (ref 135–145)
Total Bilirubin: 0.3 mg/dL (ref 0.3–1.2)
Total Protein: 7.6 g/dL (ref 6.5–8.1)

## 2018-11-01 LAB — CBC WITH DIFFERENTIAL (CANCER CENTER ONLY)
Abs Immature Granulocytes: 0.11 10*3/uL — ABNORMAL HIGH (ref 0.00–0.07)
Basophils Absolute: 0.1 10*3/uL (ref 0.0–0.1)
Basophils Relative: 1 %
Eosinophils Absolute: 0.1 10*3/uL (ref 0.0–0.5)
Eosinophils Relative: 1 %
HCT: 32.4 % — ABNORMAL LOW (ref 36.0–46.0)
HEMOGLOBIN: 11.2 g/dL — AB (ref 12.0–15.0)
IMMATURE GRANULOCYTES: 1 %
LYMPHS PCT: 19 %
Lymphs Abs: 2.4 10*3/uL (ref 0.7–4.0)
MCH: 30.9 pg (ref 26.0–34.0)
MCHC: 34.6 g/dL (ref 30.0–36.0)
MCV: 89.5 fL (ref 80.0–100.0)
Monocytes Absolute: 1.4 10*3/uL — ABNORMAL HIGH (ref 0.1–1.0)
Monocytes Relative: 11 %
NEUTROS PCT: 67 %
Neutro Abs: 8.5 10*3/uL — ABNORMAL HIGH (ref 1.7–7.7)
Platelet Count: 671 10*3/uL — ABNORMAL HIGH (ref 150–400)
RBC: 3.62 MIL/uL — ABNORMAL LOW (ref 3.87–5.11)
RDW: 15.9 % — ABNORMAL HIGH (ref 11.5–15.5)
WBC Count: 12.5 10*3/uL — ABNORMAL HIGH (ref 4.0–10.5)
nRBC: 0 % (ref 0.0–0.2)

## 2018-11-01 LAB — TOTAL PROTEIN, URINE DIPSTICK: Protein, ur: <30 mg/dL — AB

## 2018-11-01 MED ORDER — SODIUM CHLORIDE 0.9% FLUSH
10.0000 mL | INTRAVENOUS | Status: DC | PRN
  Administered 2018-11-01: 10 mL
  Filled 2018-11-01: qty 10

## 2018-11-01 MED ORDER — PALONOSETRON HCL INJECTION 0.25 MG/5ML
0.2500 mg | Freq: Once | INTRAVENOUS | Status: AC
  Administered 2018-11-01: 0.25 mg via INTRAVENOUS

## 2018-11-01 MED ORDER — PALONOSETRON HCL INJECTION 0.25 MG/5ML
INTRAVENOUS | Status: AC
  Filled 2018-11-01: qty 5

## 2018-11-01 MED ORDER — POTASSIUM CHLORIDE CRYS ER 20 MEQ PO TBCR
20.0000 meq | EXTENDED_RELEASE_TABLET | Freq: Once | ORAL | Status: AC
  Administered 2018-11-01: 20 meq via ORAL

## 2018-11-01 MED ORDER — SODIUM CHLORIDE 0.9 % IV SOLN
485.0000 mg/m2 | Freq: Once | INTRAVENOUS | Status: AC
  Administered 2018-11-01: 800 mg via INTRAVENOUS
  Filled 2018-11-01: qty 20

## 2018-11-01 MED ORDER — SODIUM CHLORIDE 0.9 % IV SOLN
352.5000 mg | Freq: Once | INTRAVENOUS | Status: AC
  Administered 2018-11-01: 350 mg via INTRAVENOUS
  Filled 2018-11-01: qty 35

## 2018-11-01 MED ORDER — SODIUM CHLORIDE 0.9 % IV SOLN
Freq: Once | INTRAVENOUS | Status: AC
  Administered 2018-11-01: 14:00:00 via INTRAVENOUS
  Filled 2018-11-01: qty 5

## 2018-11-01 MED ORDER — POTASSIUM CHLORIDE CRYS ER 20 MEQ PO TBCR
EXTENDED_RELEASE_TABLET | ORAL | Status: AC
  Filled 2018-11-01: qty 1

## 2018-11-01 MED ORDER — SODIUM CHLORIDE 0.9 % IV SOLN
14.5000 mg/kg | Freq: Once | INTRAVENOUS | Status: AC
  Administered 2018-11-01: 900 mg via INTRAVENOUS
  Filled 2018-11-01: qty 4

## 2018-11-01 MED ORDER — HEPARIN SOD (PORK) LOCK FLUSH 100 UNIT/ML IV SOLN
500.0000 [IU] | Freq: Once | INTRAVENOUS | Status: AC | PRN
  Administered 2018-11-01: 500 [IU]
  Filled 2018-11-01: qty 5

## 2018-11-01 MED ORDER — SODIUM CHLORIDE 0.9 % IV SOLN
Freq: Once | INTRAVENOUS | Status: AC
  Administered 2018-11-01: 13:00:00 via INTRAVENOUS
  Filled 2018-11-01: qty 250

## 2018-11-01 NOTE — Progress Notes (Signed)
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  She is not a candidate for immunotherapy.  PRIOR THERAPY:None  CURRENT THERAPY: Systemic chemotherapy with carboplatin for AUC of 5, Alimta 500 mg/M2 and a Avastin 15 mg/KG every 3 weeks.  First dose October 09, 2018.  INTERVAL HISTORY: Donna Mcneil 78 y.o. female returns to the clinic today for follow-up visit accompanied by her brother.  The patient is feeling fine except for fatigue.  She also has baseline shortness of breath and currently on home oxygen.  She tolerated the first cycle of her treatment with carboplatin, Alimta and Avastin fairly well.  She denied having any bleeding issues.  She has no nausea, vomiting, diarrhea or constipation.  She had a Port-A-Cath placed recently.  She is here for evaluation before starting cycle #2.  MEDICAL HISTORY: Past Medical History:  Diagnosis Date  . COPD exacerbation ([place]) 09/01/2018  . Diverticulitis   . Hypercholesteremia   . Hypertension   . Lupus ([place])   . Osteoporosis   . Tobacco use     ALLERGIES:  is allergic to codeine; cyclosporine; other; pregabalin; and zolpidem.  MEDICATIONS:  Current Outpatient Medications  Medication Sig Dispense Refill  . acetaminophen (TYLENOL) 500 MG tablet Take 1,000 mg by mouth every 6 (six) hours as needed for mild pain.     Marland Kitchen albuterol (PROVENTIL) (2.5 MG/3ML) 0.083% nebulizer solution Take 3 mLs (2.5 mg total) by nebulization every 4 (four) hours as needed for  wheezing or shortness of breath. 180 mL 2  . ALPRAZolam (XANAX) 0.5 MG tablet TAKE 1 TABLET BY MOUTH TWICE A DAY AS NEEDED (Patient taking differently: Take 0.5 mg by mouth 2 (two) times daily. ) 60 tablet 0  . amLODipine (NORVASC) 10 MG tablet TAKE 1 TABLET BY MOUTH ONCE DAILY 30 tablet 6  . aspirin 81 MG tablet Take 81 mg by mouth daily.    Marland Kitchen atorvastatin (LIPITOR) 40 MG tablet TAKE 1 TABLET BY MOUTH ONCE DAILY (Patient taking differently: Take 40 mg by mouth every evening. ) 90 tablet 3  . b complex vitamins tablet Take 2 tablets by mouth 2 (two) times daily.    . Biotin (BIOTIN 5000) 5 MG CAPS Take 5,000 mg by mouth daily.    . Calcium Carbonate-Vitamin D (CALCIUM 600+D) 600-200 MG-UNIT TABS Take 1 tablet by mouth 2 (two) times daily.     . carboxymethylcellulose (REFRESH TEARS) 0.5 % SOLN Place 1 drop into both eyes daily as needed (dry eyes).     . Coenzyme Q10 (COQ10) 100 MG CAPS Take 100 mg by mouth daily.    . folic acid (FOLVITE) 1 MG tablet Take 1 tablet (1 mg total) by mouth daily. 30 tablet 4  . gabapentin (NEURONTIN) 100 MG capsule Take 100 mg by mouth 3 (three) times daily.     Marland Kitchen guaiFENesin (MUCINEX) 600 MG 12 hr tablet Take 1 tablet (600 mg total) by mouth 2 (two) times daily. 14 tablet 0  . hydrochlorothiazide (HYDRODIURIL) 50 MG tablet TAKE 1 TABLET BY MOUTH  ONCE A DAY (Patient taking differently: Take 50 mg by mouth daily. ) 30 tablet 12  . hydroxychloroquine (PLAQUENIL) 200 MG tablet Take 200 mg by mouth daily.     Javier Docker Oil 500 MG CAPS Take 500 mg by mouth daily.    Marland Kitchen lidocaine-prilocaine (EMLA) cream Apply 1 application topically as needed. 30 g 0  . methotrexate (RHEUMATREX) 2.5 MG tablet Take 15 mg by mouth every Monday.     . Multiple Vitamins-Minerals (MULTIVITAMIN ADULT PO) Take 1 tablet by mouth daily.     . Omega 3 1000 MG CAPS Take 1,000-2,000 mg by mouth See admin instructions. Take 2000 mg in the morning and 1000 mg at night    . Potassium 99 MG TABS Take 99 mg  by mouth daily.     Marland Kitchen PROAIR HFA 108 (90 Base) MCG/ACT inhaler Inhale 2 puffs into the lungs every 6 (six) hours as needed for wheezing or shortness of breath. 3 Inhaler 2  . prochlorperazine (COMPAZINE) 10 MG tablet Take 1 tablet (10 mg total) by mouth every 6 (six) hours as needed for nausea or vomiting. 30 tablet 0  . senna-docusate (SENOKOT-S) 8.6-50 MG tablet Take 1 tablet by mouth 2 (two) times daily.    Marland Kitchen umeclidinium-vilanterol (ANORO ELLIPTA) 62.5-25 MCG/INH AEPB Inhale 1 puff into the lungs daily. 60 each 3  . Vitamin D, Cholecalciferol, 1000 units TABS Take 1,000 Units by mouth at bedtime.      No current facility-administered medications for this visit.     SURGICAL HISTORY:  Past Surgical History:  Procedure Laterality Date  . ABDOMINAL HYSTERECTOMY  12/1969  . APPENDECTOMY  1957  . CATARACT EXTRACTION    . CHOLECYSTECTOMY  1997  . IR IMAGING GUIDED PORT INSERTION  10/16/2018  . LAPAROSCOPIC SIGMOID COLECTOMY  2013   secondary to diverticulitis  . spit seed removal    . TONSILLECTOMY  1945    REVIEW OF SYSTEMS:  A comprehensive review of systems was negative except for: Constitutional: positive for fatigue Respiratory: positive for dyspnea on exertion   PHYSICAL EXAMINATION: General appearance: alert, cooperative, fatigued and no distress Head: Normocephalic, without obvious abnormality, atraumatic Neck: no adenopathy, no JVD, supple, symmetrical, trachea midline and thyroid not enlarged, symmetric, no tenderness/mass/nodules Lymph nodes: Cervical, supraclavicular, and axillary nodes normal. Resp: diminished breath sounds LLL and dullness to percussion LLL Back: symmetric, no curvature. ROM normal. No CVA tenderness. Cardio: regular rate and rhythm, S1, S2 normal, no murmur, click, rub or gallop GI: soft, non-tender; bowel sounds normal; no masses,  no organomegaly Extremities: extremities normal, atraumatic, no cyanosis or edema  ECOG PERFORMANCE STATUS: 1 -  Symptomatic but completely ambulatory  Blood pressure 125/80, pulse (!) 53, temperature 97.9 F (36.6 C), temperature source Oral, resp. rate 18, height 5\' 2"  (1.575 m), weight 129 lb 4.8 oz (58.7 kg), SpO2 96 %.  LABORATORY DATA: Lab Results  Component Value Date   WBC 12.5 (H) 11/01/2018   HGB 11.2 (L) 11/01/2018   HCT 32.4 (L) 11/01/2018   MCV 89.5 11/01/2018   PLT 671 (H) 11/01/2018      Chemistry      Component Value Date/Time   NA 136 10/24/2018 1145   NA 136 05/14/2018 1225   NA 134 (L) 04/23/2012 0640   K 3.3 (L) 10/24/2018 1145   K 2.8 (L) 04/23/2012 0640   CL 96 (L) 10/24/2018 1145   CL 91 (L) 04/23/2012 0640   CO2 30 10/24/2018 1145   CO2  32 04/23/2012 0640   BUN 11 10/24/2018 1145   BUN 8 05/14/2018 1225   BUN 13 04/23/2012 0640   CREATININE 0.63 10/24/2018 1145   CREATININE 0.72 07/04/2017 1045      Component Value Date/Time   CALCIUM 10.0 10/24/2018 1145   CALCIUM 8.3 (L) 04/23/2012 0640   ALKPHOS 90 10/24/2018 1145   ALKPHOS 54 04/05/2012 0957   AST 26 10/24/2018 1145   ALT 22 10/24/2018 1145   ALT 23 04/05/2012 0957   BILITOT 0.4 10/24/2018 1145       RADIOGRAPHIC STUDIES: Dg Chest 2 View  Result Date: 10/30/2018 CLINICAL DATA:  Left pleural effusion. EXAM: CHEST - 2 VIEW COMPARISON:  10/01/2018 and 09/12/2018 FINDINGS: A right jugular Port-A-Cath has been placed and terminates over the mid to lower SVC. The cardiac silhouette is borderline enlarged. Aortic atherosclerosis is noted. The lungs are hyperinflated with diffusely increased interstitial markings bilaterally. There is improved left lung aeration with resolved basilar consolidation compared to 10/01/2018 and with the overall appearance of the lungs similar to the older study from 09/12/2018. Left apical pleural thickening is again noted. There is also pleural thickening and/or a persistent small pleural effusion in the left lung base. No pneumothorax is identified. No acute osseous abnormality  is seen. IMPRESSION: 1. Improved aeration of the left lung base. Persistent small left pleural effusion and/or pleural thickening. 2. COPD/chronic bronchitic changes. Electronically Signed   By: Logan Bores M.D.   On: 10/30/2018 17:06   Ir Imaging Guided Port Insertion  Result Date: 10/16/2018 CLINICAL DATA:  Adenocarcinoma of left lung, stage IV. Needs durable venous access for planned chemotherapy regimen EXAM: TUNNELED PORT CATHETER PLACEMENT WITH ULTRASOUND AND FLUOROSCOPIC GUIDANCE FLUOROSCOPY TIME:  0.1 minute; 11 uGym2 DAP ANESTHESIA/SEDATION: Intravenous Fentanyl and Versed were administered as conscious sedation during continuous monitoring of the patient's level of consciousness and physiological / cardiorespiratory status by the radiology RN, with a total moderate sedation time of 15 minutes. TECHNIQUE: The procedure, risks, benefits, and alternatives were explained to the patient. Questions regarding the procedure were encouraged and answered. The patient understands and consents to the procedure. As antibiotic prophylaxis, cefazolin 2 g was ordered pre-procedure and administered intravenously within one hour of incision. Patency of the right IJ vein was confirmed with ultrasound with image documentation. An appropriate skin site was determined. Skin site was marked. Region was prepped using maximum barrier technique including cap and mask, sterile gown, sterile gloves, large sterile sheet, and Chlorhexidine as cutaneous antisepsis. The region was infiltrated locally with 1% lidocaine. Under real-time ultrasound guidance, the right IJ vein was accessed with a 21 gauge micropuncture needle; the needle tip within the vein was confirmed with ultrasound image documentation. Needle was exchanged over a 018 guidewire for transitional dilator which allowed passage of the [place] wire into the IVC. Over this, the transitional dilator was exchanged for a 5 [place] MPA catheter. A small incision was made on the  right anterior chest wall and a subcutaneous pocket fashioned. The power-injectable port was positioned and its catheter tunneled to the right IJ dermatotomy site. The MPA catheter was exchanged over an Amplatz wire for a peel-away sheath, through which the port catheter, which had been trimmed to the appropriate length, was advanced and positioned under fluoroscopy with its tip at the cavoatrial junction. Spot chest radiograph confirms good catheter position and no pneumothorax. The pocket was closed with deep interrupted and subcuticular continuous 3-0 Monocryl sutures. The port was flushed per protocol. The incisions  were covered with Dermabond then covered with a sterile dressing. COMPLICATIONS: COMPLICATIONS None immediate IMPRESSION: Technically successful right IJ power-injectable port catheter placement. Ready for routine use. Electronically Signed   By: Lucrezia Europe M.D.   On: 10/16/2018 15:56    ASSESSMENT AND PLAN: This is a very pleasant 78 years old white female recently diagnosed with stage IV (T1b, N2, M1 a) non-small cell lung cancer highly suspicious for adenocarcinoma presented with left upper lobe lung nodule in addition to left hilar and mediastinal lymphadenopathy as well as malignant right pleural effusion and pleural based metastasis diagnosed in January 2020. The patient was started on systemic chemotherapy with carboplatin, Alimta and Avastin status post 1 cycle.  She tolerated the first cycle of her treatment well except for fatigue. I recommended for her to proceed with cycle #2 today as scheduled. For the anemia, I recommended for her to take over-the-counter iron tablets 1-2 tablets every day. She will come back for follow-up visit in 3 weeks for evaluation before starting cycle #3. The patient was advised to call immediately if she has any concerning symptoms in the interval. The patient voices understanding of current disease status and treatment options and is in agreement with  the current care plan. All questions were answered. The patient knows to call the clinic with any problems, questions or concerns. We can certainly see the patient much sooner if necessary.  I spent 10 minutes counseling the patient face to face. The total time spent in the appointment was 15 minutes.  Disclaimer: This note was dictated with voice recognition software. Similar sounding words can inadvertently be transcribed and may not be corrected upon review.

## 2018-11-01 NOTE — Telephone Encounter (Signed)
Scheduled appt per 3/5 los - patient to get an updated schedule next visit.

## 2018-11-01 NOTE — Progress Notes (Signed)
Okay to treat with Potassium 3.1 today per Dr. Julien Nordmann.  Potassium 23mEq X 1 today per Dr. Julien Nordmann.  Port site is clean, dry, and intact.  No redness, streaking, or drainage present.

## 2018-11-01 NOTE — Patient Instructions (Signed)
[facility] Common Discharge Instructions for Patients Receiving Chemotherapy  Today you received the following chemotherapy agents Bevacizumab (AVASTIN), Pemetrexed (ALIMTA) & Carboplatin (PARAPLATIN).  To help prevent nausea and vomiting after your treatment, we encourage you to take your nausea medication as prescribed.   If you develop nausea and vomiting that is not controlled by your nausea medication, call the clinic.   BELOW ARE SYMPTOMS THAT SHOULD BE REPORTED IMMEDIATELY:  *FEVER GREATER THAN 100.5 F  *CHILLS WITH OR WITHOUT FEVER  NAUSEA AND VOMITING THAT IS NOT CONTROLLED WITH YOUR NAUSEA MEDICATION  *UNUSUAL SHORTNESS OF BREATH  *UNUSUAL BRUISING OR BLEEDING  TENDERNESS IN MOUTH AND THROAT WITH OR WITHOUT PRESENCE OF ULCERS  *URINARY PROBLEMS  *BOWEL PROBLEMS  UNUSUAL RASH Items with * indicate a potential emergency and should be followed up as soon as possible.

## 2018-11-01 NOTE — Patient Instructions (Signed)

## 2018-11-03 DIAGNOSIS — J9611 Chronic respiratory failure with hypoxia: Secondary | ICD-10-CM | POA: Diagnosis not present

## 2018-11-03 DIAGNOSIS — C3492 Malignant neoplasm of unspecified part of left bronchus or lung: Secondary | ICD-10-CM | POA: Diagnosis not present

## 2018-11-03 DIAGNOSIS — J449 Chronic obstructive pulmonary disease, unspecified: Secondary | ICD-10-CM | POA: Diagnosis not present

## 2018-11-05 ENCOUNTER — Other Ambulatory Visit: Payer: Self-pay | Admitting: Family Medicine

## 2018-11-05 DIAGNOSIS — F409 Phobic anxiety disorder, unspecified: Secondary | ICD-10-CM

## 2018-11-07 ENCOUNTER — Inpatient Hospital Stay: Payer: Medicare Other

## 2018-11-07 ENCOUNTER — Other Ambulatory Visit: Payer: Self-pay

## 2018-11-07 DIAGNOSIS — C3412 Malignant neoplasm of upper lobe, left bronchus or lung: Secondary | ICD-10-CM | POA: Diagnosis not present

## 2018-11-07 DIAGNOSIS — J449 Chronic obstructive pulmonary disease, unspecified: Secondary | ICD-10-CM | POA: Diagnosis not present

## 2018-11-07 DIAGNOSIS — Z5112 Encounter for antineoplastic immunotherapy: Secondary | ICD-10-CM | POA: Diagnosis not present

## 2018-11-07 DIAGNOSIS — C778 Secondary and unspecified malignant neoplasm of lymph nodes of multiple regions: Secondary | ICD-10-CM | POA: Diagnosis not present

## 2018-11-07 DIAGNOSIS — Z79899 Other long term (current) drug therapy: Secondary | ICD-10-CM | POA: Diagnosis not present

## 2018-11-07 DIAGNOSIS — C782 Secondary malignant neoplasm of pleura: Secondary | ICD-10-CM | POA: Diagnosis not present

## 2018-11-07 DIAGNOSIS — R5383 Other fatigue: Secondary | ICD-10-CM | POA: Diagnosis not present

## 2018-11-07 DIAGNOSIS — C3492 Malignant neoplasm of unspecified part of left bronchus or lung: Secondary | ICD-10-CM

## 2018-11-07 DIAGNOSIS — Z7982 Long term (current) use of aspirin: Secondary | ICD-10-CM | POA: Diagnosis not present

## 2018-11-07 DIAGNOSIS — Z5111 Encounter for antineoplastic chemotherapy: Secondary | ICD-10-CM | POA: Diagnosis not present

## 2018-11-07 DIAGNOSIS — Z8719 Personal history of other diseases of the digestive system: Secondary | ICD-10-CM | POA: Diagnosis not present

## 2018-11-07 DIAGNOSIS — E7800 Pure hypercholesterolemia, unspecified: Secondary | ICD-10-CM | POA: Diagnosis not present

## 2018-11-07 DIAGNOSIS — I10 Essential (primary) hypertension: Secondary | ICD-10-CM | POA: Diagnosis not present

## 2018-11-07 DIAGNOSIS — M810 Age-related osteoporosis without current pathological fracture: Secondary | ICD-10-CM | POA: Diagnosis not present

## 2018-11-07 DIAGNOSIS — Z95828 Presence of other vascular implants and grafts: Secondary | ICD-10-CM

## 2018-11-07 DIAGNOSIS — M069 Rheumatoid arthritis, unspecified: Secondary | ICD-10-CM | POA: Diagnosis not present

## 2018-11-07 DIAGNOSIS — M329 Systemic lupus erythematosus, unspecified: Secondary | ICD-10-CM | POA: Diagnosis not present

## 2018-11-07 LAB — CMP (CANCER CENTER ONLY)
ALBUMIN: 2.5 g/dL — AB (ref 3.5–5.0)
ALT: 18 U/L (ref 0–44)
AST: 24 U/L (ref 15–41)
Alkaline Phosphatase: 98 U/L (ref 38–126)
Anion gap: 12 (ref 5–15)
BUN: 13 mg/dL (ref 8–23)
CALCIUM: 10.3 mg/dL (ref 8.9–10.3)
CO2: 30 mmol/L (ref 22–32)
Chloride: 92 mmol/L — ABNORMAL LOW (ref 98–111)
Creatinine: 0.66 mg/dL (ref 0.44–1.00)
GFR, Est AFR Am: >60 mL/min (ref >60)
GFR, Estimated: >60 mL/min (ref >60)
Glucose, Bld: 100 mg/dL — ABNORMAL HIGH (ref 70–99)
Potassium: 3.5 mmol/L (ref 3.5–5.1)
Sodium: 134 mmol/L — ABNORMAL LOW (ref 135–145)
Total Bilirubin: 0.6 mg/dL (ref 0.3–1.2)
Total Protein: 7.1 g/dL (ref 6.5–8.1)

## 2018-11-07 LAB — CBC WITH DIFFERENTIAL (CANCER CENTER ONLY)
ABS IMMATURE GRANULOCYTES: 0.02 10*3/uL (ref 0.00–0.07)
BASOS PCT: 1 %
Basophils Absolute: 0.1 10*3/uL (ref 0.0–0.1)
EOS ABS: 0.1 10*3/uL (ref 0.0–0.5)
Eosinophils Relative: 1 %
HCT: 32.6 % — ABNORMAL LOW (ref 36.0–46.0)
Hemoglobin: 10.8 g/dL — ABNORMAL LOW (ref 12.0–15.0)
IMMATURE GRANULOCYTES: 0 %
Lymphocytes Relative: 18 %
Lymphs Abs: 1.6 10*3/uL (ref 0.7–4.0)
MCH: 30.4 pg (ref 26.0–34.0)
MCHC: 33.1 g/dL (ref 30.0–36.0)
MCV: 91.8 fL (ref 80.0–100.0)
Monocytes Absolute: 0.3 10*3/uL (ref 0.1–1.0)
Monocytes Relative: 3 %
NEUTROS PCT: 77 %
Neutro Abs: 6.7 10*3/uL (ref 1.7–7.7)
PLATELETS: 390 10*3/uL (ref 150–400)
RBC: 3.55 MIL/uL — ABNORMAL LOW (ref 3.87–5.11)
RDW: 15.4 % (ref 11.5–15.5)
WBC Count: 8.7 10*3/uL (ref 4.0–10.5)
nRBC: 0 % (ref 0.0–0.2)

## 2018-11-07 MED ORDER — HEPARIN SOD (PORK) LOCK FLUSH 100 UNIT/ML IV SOLN
500.0000 [IU] | Freq: Once | INTRAVENOUS | Status: AC | PRN
  Administered 2018-11-07: 500 [IU]
  Filled 2018-11-07: qty 5

## 2018-11-07 MED ORDER — SODIUM CHLORIDE 0.9% FLUSH
10.0000 mL | INTRAVENOUS | Status: DC | PRN
  Administered 2018-11-07: 10 mL
  Filled 2018-11-07: qty 10

## 2018-11-07 NOTE — Patient Instructions (Signed)

## 2018-11-13 ENCOUNTER — Other Ambulatory Visit: Payer: Self-pay | Admitting: Family Medicine

## 2018-11-14 ENCOUNTER — Other Ambulatory Visit: Payer: Self-pay

## 2018-11-14 ENCOUNTER — Inpatient Hospital Stay: Payer: Medicare Other

## 2018-11-14 ENCOUNTER — Other Ambulatory Visit: Payer: Self-pay | Admitting: Medical Oncology

## 2018-11-14 DIAGNOSIS — C3492 Malignant neoplasm of unspecified part of left bronchus or lung: Secondary | ICD-10-CM

## 2018-11-14 DIAGNOSIS — Z5112 Encounter for antineoplastic immunotherapy: Secondary | ICD-10-CM | POA: Diagnosis not present

## 2018-11-14 DIAGNOSIS — I10 Essential (primary) hypertension: Secondary | ICD-10-CM | POA: Diagnosis not present

## 2018-11-14 DIAGNOSIS — C778 Secondary and unspecified malignant neoplasm of lymph nodes of multiple regions: Secondary | ICD-10-CM | POA: Diagnosis not present

## 2018-11-14 DIAGNOSIS — M810 Age-related osteoporosis without current pathological fracture: Secondary | ICD-10-CM | POA: Diagnosis not present

## 2018-11-14 DIAGNOSIS — Z8719 Personal history of other diseases of the digestive system: Secondary | ICD-10-CM | POA: Diagnosis not present

## 2018-11-14 DIAGNOSIS — R5383 Other fatigue: Secondary | ICD-10-CM | POA: Diagnosis not present

## 2018-11-14 DIAGNOSIS — Z95828 Presence of other vascular implants and grafts: Secondary | ICD-10-CM

## 2018-11-14 DIAGNOSIS — Z79899 Other long term (current) drug therapy: Secondary | ICD-10-CM | POA: Diagnosis not present

## 2018-11-14 DIAGNOSIS — E7800 Pure hypercholesterolemia, unspecified: Secondary | ICD-10-CM | POA: Diagnosis not present

## 2018-11-14 DIAGNOSIS — J449 Chronic obstructive pulmonary disease, unspecified: Secondary | ICD-10-CM | POA: Diagnosis not present

## 2018-11-14 DIAGNOSIS — M069 Rheumatoid arthritis, unspecified: Secondary | ICD-10-CM | POA: Diagnosis not present

## 2018-11-14 DIAGNOSIS — C3412 Malignant neoplasm of upper lobe, left bronchus or lung: Secondary | ICD-10-CM | POA: Diagnosis not present

## 2018-11-14 DIAGNOSIS — M329 Systemic lupus erythematosus, unspecified: Secondary | ICD-10-CM | POA: Diagnosis not present

## 2018-11-14 DIAGNOSIS — C782 Secondary malignant neoplasm of pleura: Secondary | ICD-10-CM | POA: Diagnosis not present

## 2018-11-14 DIAGNOSIS — Z7982 Long term (current) use of aspirin: Secondary | ICD-10-CM | POA: Diagnosis not present

## 2018-11-14 DIAGNOSIS — E876 Hypokalemia: Secondary | ICD-10-CM

## 2018-11-14 DIAGNOSIS — Z5111 Encounter for antineoplastic chemotherapy: Secondary | ICD-10-CM | POA: Diagnosis not present

## 2018-11-14 LAB — CBC WITH DIFFERENTIAL (CANCER CENTER ONLY)
Abs Immature Granulocytes: 0.07 10*3/uL (ref 0.00–0.07)
Basophils Absolute: 0.1 10*3/uL (ref 0.0–0.1)
Basophils Relative: 1 %
EOS ABS: 0.1 10*3/uL (ref 0.0–0.5)
EOS PCT: 1 %
HCT: 29.1 % — ABNORMAL LOW (ref 36.0–46.0)
Hemoglobin: 10 g/dL — ABNORMAL LOW (ref 12.0–15.0)
Immature Granulocytes: 1 %
Lymphocytes Relative: 15 %
Lymphs Abs: 1.6 10*3/uL (ref 0.7–4.0)
MCH: 30.9 pg (ref 26.0–34.0)
MCHC: 34.4 g/dL (ref 30.0–36.0)
MCV: 89.8 fL (ref 80.0–100.0)
Monocytes Absolute: 1.1 10*3/uL — ABNORMAL HIGH (ref 0.1–1.0)
Monocytes Relative: 11 %
Neutro Abs: 7.3 10*3/uL (ref 1.7–7.7)
Neutrophils Relative %: 71 %
Platelet Count: 159 10*3/uL (ref 150–400)
RBC: 3.24 MIL/uL — ABNORMAL LOW (ref 3.87–5.11)
RDW: 15.9 % — AB (ref 11.5–15.5)
WBC: 10.2 10*3/uL (ref 4.0–10.5)
nRBC: 0 % (ref 0.0–0.2)

## 2018-11-14 LAB — CMP (CANCER CENTER ONLY)
ALK PHOS: 98 U/L (ref 38–126)
ALT: 28 U/L (ref 0–44)
AST: 22 U/L (ref 15–41)
Albumin: 2.3 g/dL — ABNORMAL LOW (ref 3.5–5.0)
Anion gap: 11 (ref 5–15)
BUN: 11 mg/dL (ref 8–23)
CO2: 28 mmol/L (ref 22–32)
Calcium: 8.8 mg/dL — ABNORMAL LOW (ref 8.9–10.3)
Chloride: 95 mmol/L — ABNORMAL LOW (ref 98–111)
Creatinine: 0.63 mg/dL (ref 0.44–1.00)
GFR, Est AFR Am: >60 mL/min (ref >60)
Glucose, Bld: 111 mg/dL — ABNORMAL HIGH (ref 70–99)
Potassium: 3 mmol/L — CL (ref 3.5–5.1)
Sodium: 134 mmol/L — ABNORMAL LOW (ref 135–145)
Total Bilirubin: 0.5 mg/dL (ref 0.3–1.2)
Total Protein: 7 g/dL (ref 6.5–8.1)

## 2018-11-14 MED ORDER — POTASSIUM CHLORIDE CRYS ER 20 MEQ PO TBCR: 20.0000 meq | EXTENDED_RELEASE_TABLET | Freq: Every day | ORAL | 0 refills | Status: DC

## 2018-11-14 MED ORDER — SODIUM CHLORIDE 0.9% FLUSH
10.0000 mL | INTRAVENOUS | Status: DC | PRN
  Administered 2018-11-14: 10 mL
  Filled 2018-11-14: qty 10

## 2018-11-14 MED ORDER — HEPARIN SOD (PORK) LOCK FLUSH 100 UNIT/ML IV SOLN
500.0000 [IU] | Freq: Once | INTRAVENOUS | Status: AC | PRN
  Administered 2018-11-14: 500 [IU]
  Filled 2018-11-14: qty 5

## 2018-11-19 ENCOUNTER — Other Ambulatory Visit: Payer: Self-pay

## 2018-11-19 ENCOUNTER — Other Ambulatory Visit (HOSPITAL_COMMUNITY): Payer: Self-pay

## 2018-11-19 ENCOUNTER — Emergency Department (HOSPITAL_COMMUNITY): Payer: Medicare Other

## 2018-11-19 ENCOUNTER — Telehealth: Payer: Self-pay | Admitting: Medical Oncology

## 2018-11-19 ENCOUNTER — Telehealth: Payer: Self-pay | Admitting: *Deleted

## 2018-11-19 ENCOUNTER — Inpatient Hospital Stay (HOSPITAL_COMMUNITY)
Admission: EM | Admit: 2018-11-19 | Discharge: 2018-12-01 | DRG: 189 | Disposition: A | Discharge status: Skilled Nursing Facility | Payer: Medicare Other | Attending: Internal Medicine | Admitting: Internal Medicine

## 2018-11-19 ENCOUNTER — Encounter (HOSPITAL_COMMUNITY): Payer: Self-pay | Admitting: Emergency Medicine

## 2018-11-19 DIAGNOSIS — I248 Other forms of acute ischemic heart disease: Secondary | ICD-10-CM | POA: Diagnosis not present

## 2018-11-19 DIAGNOSIS — I110 Hypertensive heart disease with heart failure: Secondary | ICD-10-CM | POA: Diagnosis not present

## 2018-11-19 DIAGNOSIS — D72829 Elevated white blood cell count, unspecified: Secondary | ICD-10-CM | POA: Diagnosis not present

## 2018-11-19 DIAGNOSIS — C3490 Malignant neoplasm of unspecified part of unspecified bronchus or lung: Secondary | ICD-10-CM | POA: Diagnosis not present

## 2018-11-19 DIAGNOSIS — R0902 Hypoxemia: Secondary | ICD-10-CM | POA: Diagnosis not present

## 2018-11-19 DIAGNOSIS — Z20828 Contact with and (suspected) exposure to other viral communicable diseases: Secondary | ICD-10-CM | POA: Diagnosis present

## 2018-11-19 DIAGNOSIS — G629 Polyneuropathy, unspecified: Secondary | ICD-10-CM | POA: Diagnosis not present

## 2018-11-19 DIAGNOSIS — Z66 Do not resuscitate: Secondary | ICD-10-CM | POA: Diagnosis not present

## 2018-11-19 DIAGNOSIS — M810 Age-related osteoporosis without current pathological fracture: Secondary | ICD-10-CM | POA: Diagnosis present

## 2018-11-19 DIAGNOSIS — I10 Essential (primary) hypertension: Secondary | ICD-10-CM | POA: Diagnosis not present

## 2018-11-19 DIAGNOSIS — J9621 Acute and chronic respiratory failure with hypoxia: Principal | ICD-10-CM | POA: Diagnosis present

## 2018-11-19 DIAGNOSIS — E876 Hypokalemia: Secondary | ICD-10-CM | POA: Diagnosis present

## 2018-11-19 DIAGNOSIS — K219 Gastro-esophageal reflux disease without esophagitis: Secondary | ICD-10-CM | POA: Diagnosis not present

## 2018-11-19 DIAGNOSIS — M1991 Primary osteoarthritis, unspecified site: Secondary | ICD-10-CM | POA: Diagnosis not present

## 2018-11-19 DIAGNOSIS — C3492 Malignant neoplasm of unspecified part of left bronchus or lung: Secondary | ICD-10-CM | POA: Diagnosis present

## 2018-11-19 DIAGNOSIS — J441 Chronic obstructive pulmonary disease with (acute) exacerbation: Secondary | ICD-10-CM | POA: Diagnosis not present

## 2018-11-19 DIAGNOSIS — D6481 Anemia due to antineoplastic chemotherapy: Secondary | ICD-10-CM | POA: Diagnosis present

## 2018-11-19 DIAGNOSIS — I5033 Acute on chronic diastolic (congestive) heart failure: Secondary | ICD-10-CM | POA: Diagnosis not present

## 2018-11-19 DIAGNOSIS — Z888 Allergy status to other drugs, medicaments and biological substances status: Secondary | ICD-10-CM

## 2018-11-19 DIAGNOSIS — Z8249 Family history of ischemic heart disease and other diseases of the circulatory system: Secondary | ICD-10-CM

## 2018-11-19 DIAGNOSIS — Z87891 Personal history of nicotine dependence: Secondary | ICD-10-CM

## 2018-11-19 DIAGNOSIS — R0602 Shortness of breath: Secondary | ICD-10-CM

## 2018-11-19 DIAGNOSIS — Z95828 Presence of other vascular implants and grafts: Secondary | ICD-10-CM

## 2018-11-19 DIAGNOSIS — I480 Paroxysmal atrial fibrillation: Secondary | ICD-10-CM | POA: Diagnosis not present

## 2018-11-19 DIAGNOSIS — I493 Ventricular premature depolarization: Secondary | ICD-10-CM | POA: Diagnosis not present

## 2018-11-19 DIAGNOSIS — E871 Hypo-osmolality and hyponatremia: Secondary | ICD-10-CM | POA: Diagnosis not present

## 2018-11-19 DIAGNOSIS — I499 Cardiac arrhythmia, unspecified: Secondary | ICD-10-CM | POA: Diagnosis not present

## 2018-11-19 DIAGNOSIS — J069 Acute upper respiratory infection, unspecified: Secondary | ICD-10-CM | POA: Diagnosis present

## 2018-11-19 DIAGNOSIS — G9009 Other idiopathic peripheral autonomic neuropathy: Secondary | ICD-10-CM | POA: Diagnosis not present

## 2018-11-19 DIAGNOSIS — Z9981 Dependence on supplemental oxygen: Secondary | ICD-10-CM

## 2018-11-19 DIAGNOSIS — Z7189 Other specified counseling: Secondary | ICD-10-CM | POA: Diagnosis not present

## 2018-11-19 DIAGNOSIS — E782 Mixed hyperlipidemia: Secondary | ICD-10-CM | POA: Diagnosis not present

## 2018-11-19 DIAGNOSIS — M329 Systemic lupus erythematosus, unspecified: Secondary | ICD-10-CM | POA: Diagnosis not present

## 2018-11-19 DIAGNOSIS — F409 Phobic anxiety disorder, unspecified: Secondary | ICD-10-CM | POA: Diagnosis present

## 2018-11-19 DIAGNOSIS — Z7401 Bed confinement status: Secondary | ICD-10-CM | POA: Diagnosis not present

## 2018-11-19 DIAGNOSIS — R05 Cough: Secondary | ICD-10-CM | POA: Diagnosis not present

## 2018-11-19 DIAGNOSIS — Z881 Allergy status to other antibiotic agents status: Secondary | ICD-10-CM

## 2018-11-19 DIAGNOSIS — I4891 Unspecified atrial fibrillation: Secondary | ICD-10-CM

## 2018-11-19 DIAGNOSIS — E785 Hyperlipidemia, unspecified: Secondary | ICD-10-CM | POA: Diagnosis present

## 2018-11-19 DIAGNOSIS — R000 Tachycardia, unspecified: Secondary | ICD-10-CM

## 2018-11-19 DIAGNOSIS — L932 Other local lupus erythematosus: Secondary | ICD-10-CM | POA: Diagnosis present

## 2018-11-19 DIAGNOSIS — J910 Malignant pleural effusion: Secondary | ICD-10-CM | POA: Diagnosis not present

## 2018-11-19 DIAGNOSIS — T502X5A Adverse effect of carbonic-anhydrase inhibitors, benzothiadiazides and other diuretics, initial encounter: Secondary | ICD-10-CM | POA: Diagnosis not present

## 2018-11-19 DIAGNOSIS — J449 Chronic obstructive pulmonary disease, unspecified: Secondary | ICD-10-CM | POA: Diagnosis present

## 2018-11-19 DIAGNOSIS — Z7982 Long term (current) use of aspirin: Secondary | ICD-10-CM

## 2018-11-19 DIAGNOSIS — J9611 Chronic respiratory failure with hypoxia: Secondary | ICD-10-CM | POA: Diagnosis present

## 2018-11-19 DIAGNOSIS — Z09 Encounter for follow-up examination after completed treatment for conditions other than malignant neoplasm: Secondary | ICD-10-CM

## 2018-11-19 DIAGNOSIS — Z79899 Other long term (current) drug therapy: Secondary | ICD-10-CM

## 2018-11-19 DIAGNOSIS — Z9049 Acquired absence of other specified parts of digestive tract: Secondary | ICD-10-CM

## 2018-11-19 DIAGNOSIS — J811 Chronic pulmonary edema: Secondary | ICD-10-CM | POA: Diagnosis not present

## 2018-11-19 DIAGNOSIS — Z90710 Acquired absence of both cervix and uterus: Secondary | ICD-10-CM

## 2018-11-19 DIAGNOSIS — J9601 Acute respiratory failure with hypoxia: Secondary | ICD-10-CM | POA: Diagnosis not present

## 2018-11-19 DIAGNOSIS — Z885 Allergy status to narcotic agent status: Secondary | ICD-10-CM

## 2018-11-19 DIAGNOSIS — Z515 Encounter for palliative care: Secondary | ICD-10-CM | POA: Diagnosis not present

## 2018-11-19 DIAGNOSIS — T451X5A Adverse effect of antineoplastic and immunosuppressive drugs, initial encounter: Secondary | ICD-10-CM | POA: Diagnosis present

## 2018-11-19 DIAGNOSIS — D638 Anemia in other chronic diseases classified elsewhere: Secondary | ICD-10-CM | POA: Diagnosis present

## 2018-11-19 DIAGNOSIS — L8990 Pressure ulcer of unspecified site, unspecified stage: Secondary | ICD-10-CM

## 2018-11-19 DIAGNOSIS — M059 Rheumatoid arthritis with rheumatoid factor, unspecified: Secondary | ICD-10-CM | POA: Diagnosis not present

## 2018-11-19 DIAGNOSIS — M2550 Pain in unspecified joint: Secondary | ICD-10-CM | POA: Diagnosis not present

## 2018-11-19 DIAGNOSIS — J90 Pleural effusion, not elsewhere classified: Secondary | ICD-10-CM | POA: Diagnosis not present

## 2018-11-19 LAB — TSH: TSH: 0.524 u[IU]/mL (ref 0.350–4.500)

## 2018-11-19 LAB — CBC WITH DIFFERENTIAL/PLATELET
Abs Immature Granulocytes: 0.05 10*3/uL (ref 0.00–0.07)
BASOS PCT: 1 %
Basophils Absolute: 0 10*3/uL (ref 0.0–0.1)
Eosinophils Absolute: 0.1 10*3/uL (ref 0.0–0.5)
Eosinophils Relative: 1 %
HEMATOCRIT: 39.7 % (ref 36.0–46.0)
Hemoglobin: 13.1 g/dL (ref 12.0–15.0)
Immature Granulocytes: 1 %
Lymphocytes Relative: 13 %
Lymphs Abs: 0.9 10*3/uL (ref 0.7–4.0)
MCH: 31.1 pg (ref 26.0–34.0)
MCHC: 33 g/dL (ref 30.0–36.0)
MCV: 94.3 fL (ref 80.0–100.0)
Monocytes Absolute: 1.2 10*3/uL — ABNORMAL HIGH (ref 0.1–1.0)
Monocytes Relative: 17 %
NEUTROS PCT: 67 %
NRBC: 0.4 % — AB (ref 0.0–0.2)
Neutro Abs: 4.7 10*3/uL (ref 1.7–7.7)
Platelets: 394 10*3/uL (ref 150–400)
RBC: 4.21 MIL/uL (ref 3.87–5.11)
RDW: 18.6 % — ABNORMAL HIGH (ref 11.5–15.5)
WBC: 6.9 10*3/uL (ref 4.0–10.5)

## 2018-11-19 LAB — COMPREHENSIVE METABOLIC PANEL
ALT: 20 U/L (ref 0–44)
AST: 20 U/L (ref 15–41)
Albumin: 2.4 g/dL — ABNORMAL LOW (ref 3.5–5.0)
Alkaline Phosphatase: 94 U/L (ref 38–126)
Anion gap: 11 (ref 5–15)
BUN: 19 mg/dL (ref 8–23)
CO2: 27 mmol/L (ref 22–32)
Calcium: 9.3 mg/dL (ref 8.9–10.3)
Chloride: 98 mmol/L (ref 98–111)
Creatinine, Ser: 0.57 mg/dL (ref 0.44–1.00)
GFR calc Af Amer: >60 mL/min (ref >60)
GFR calc non Af Amer: >60 mL/min (ref >60)
GLUCOSE: 121 mg/dL — AB (ref 70–99)
Potassium: 3.6 mmol/L (ref 3.5–5.1)
Sodium: 136 mmol/L (ref 135–145)
Total Bilirubin: 0.5 mg/dL (ref 0.3–1.2)
Total Protein: 7.4 g/dL (ref 6.5–8.1)

## 2018-11-19 LAB — BRAIN NATRIURETIC PEPTIDE: B Natriuretic Peptide: 540.4 pg/mL — ABNORMAL HIGH (ref 0.0–100.0)

## 2018-11-19 LAB — LACTIC ACID, PLASMA: Lactic Acid, Venous: 1.3 mmol/L (ref 0.5–1.9)

## 2018-11-19 LAB — INFLUENZA PANEL BY PCR (TYPE A & B)
Influenza A By PCR: NEGATIVE
Influenza B By PCR: NEGATIVE

## 2018-11-19 LAB — TROPONIN I: Troponin I: <0.03 ng/mL (ref <0.03)

## 2018-11-19 LAB — PROCALCITONIN: Procalcitonin: 59.26 ng/mL

## 2018-11-19 LAB — T4, FREE: Free T4: 1.33 ng/dL (ref 0.82–1.77)

## 2018-11-19 LAB — MRSA PCR SCREENING: MRSA by PCR: NEGATIVE

## 2018-11-19 MED ORDER — SODIUM CHLORIDE 0.9 % IV BOLUS
500.0000 mL | Freq: Once | INTRAVENOUS | Status: AC
  Administered 2018-11-19: 500 mL via INTRAVENOUS

## 2018-11-19 MED ORDER — ALPRAZOLAM 1 MG PO TABS
1.0000 mg | ORAL_TABLET | Freq: Every day | ORAL | Status: DC
  Administered 2018-11-20 – 2018-11-26 (×7): 1 mg via ORAL
  Filled 2018-11-19 (×7): qty 1
  Filled 2018-11-19: qty 2

## 2018-11-19 MED ORDER — POLYETHYLENE GLYCOL 3350 17 G PO PACK
17.0000 g | PACK | Freq: Every day | ORAL | Status: DC | PRN
  Administered 2018-11-29: 17 g via ORAL
  Filled 2018-11-19: qty 1

## 2018-11-19 MED ORDER — ENSURE ENLIVE PO LIQD
237.0000 mL | Freq: Two times a day (BID) | ORAL | Status: DC
  Administered 2018-11-20 – 2018-11-30 (×13): 237 mL via ORAL

## 2018-11-19 MED ORDER — UMECLIDINIUM-VILANTEROL 62.5-25 MCG/INH IN AEPB
1.0000 | INHALATION_SPRAY | Freq: Every day | RESPIRATORY_TRACT | Status: DC
  Administered 2018-11-20 – 2018-12-01 (×12): 1 via RESPIRATORY_TRACT
  Filled 2018-11-19 (×2): qty 14

## 2018-11-19 MED ORDER — IOHEXOL 350 MG/ML SOLN
100.0000 mL | Freq: Once | INTRAVENOUS | Status: AC | PRN
  Administered 2018-11-19: 100 mL via INTRAVENOUS

## 2018-11-19 MED ORDER — ASPIRIN EC 81 MG PO TBEC
81.0000 mg | DELAYED_RELEASE_TABLET | Freq: Every day | ORAL | Status: DC
  Administered 2018-11-20: 81 mg via ORAL
  Filled 2018-11-19: qty 1

## 2018-11-19 MED ORDER — ENOXAPARIN SODIUM 40 MG/0.4ML ~~LOC~~ SOLN
40.0000 mg | SUBCUTANEOUS | Status: DC
  Administered 2018-11-19: 40 mg via SUBCUTANEOUS
  Filled 2018-11-19: qty 0.4

## 2018-11-19 MED ORDER — AZITHROMYCIN 250 MG PO TABS
500.0000 mg | ORAL_TABLET | ORAL | Status: AC
  Administered 2018-11-20 – 2018-11-25 (×6): 500 mg via ORAL
  Filled 2018-11-19 (×7): qty 2

## 2018-11-19 MED ORDER — HYDROCHLOROTHIAZIDE 25 MG PO TABS
50.0000 mg | ORAL_TABLET | Freq: Every day | ORAL | Status: DC
  Administered 2018-11-20: 50 mg via ORAL
  Filled 2018-11-19: qty 2

## 2018-11-19 MED ORDER — ONDANSETRON HCL 4 MG/2ML IJ SOLN: 4.0000 mg | Freq: Four times a day (QID) | INTRAMUSCULAR | Status: DC | PRN

## 2018-11-19 MED ORDER — CARBOXYMETHYLCELLULOSE SODIUM 0.5 % OP SOLN: 1.0000 [drp] | Freq: Every day | OPHTHALMIC | Status: DC | PRN

## 2018-11-19 MED ORDER — PROCHLORPERAZINE MALEATE 10 MG PO TABS: 10.0000 mg | ORAL_TABLET | Freq: Four times a day (QID) | ORAL | Status: DC | PRN

## 2018-11-19 MED ORDER — METOPROLOL TARTRATE 12.5 MG HALF TABLET
12.5000 mg | ORAL_TABLET | Freq: Two times a day (BID) | ORAL | Status: DC
  Administered 2018-11-20: 12.5 mg via ORAL
  Filled 2018-11-19 (×2): qty 1

## 2018-11-19 MED ORDER — POLYVINYL ALCOHOL 1.4 % OP SOLN: 1.0000 [drp] | Freq: Every day | OPHTHALMIC | Status: DC | PRN

## 2018-11-19 MED ORDER — AMLODIPINE BESYLATE 10 MG PO TABS
10.0000 mg | ORAL_TABLET | Freq: Every day | ORAL | Status: DC
  Administered 2018-11-20: 10 mg via ORAL
  Filled 2018-11-19: qty 1

## 2018-11-19 MED ORDER — GABAPENTIN 100 MG PO CAPS
100.0000 mg | ORAL_CAPSULE | Freq: Three times a day (TID) | ORAL | Status: DC
  Administered 2018-11-20 – 2018-12-01 (×34): 100 mg via ORAL
  Filled 2018-11-19 (×35): qty 1

## 2018-11-19 MED ORDER — PREDNISONE 20 MG PO TABS
40.0000 mg | ORAL_TABLET | Freq: Every day | ORAL | Status: DC
  Administered 2018-11-20: 40 mg via ORAL
  Filled 2018-11-19: qty 2

## 2018-11-19 MED ORDER — LEVALBUTEROL TARTRATE 45 MCG/ACT IN AERO
1.0000 | INHALATION_SPRAY | Freq: Four times a day (QID) | RESPIRATORY_TRACT | Status: DC
  Administered 2018-11-20: 1 via RESPIRATORY_TRACT
  Filled 2018-11-19: qty 15

## 2018-11-19 MED ORDER — HYDROCODONE-ACETAMINOPHEN 5-325 MG PO TABS
1.0000 | ORAL_TABLET | ORAL | Status: DC | PRN
  Administered 2018-11-20 (×2): 1 via ORAL
  Administered 2018-11-21: 2 via ORAL
  Administered 2018-11-22: 1 via ORAL
  Administered 2018-11-23 – 2018-11-26 (×11): 2 via ORAL
  Administered 2018-11-26: 1 via ORAL
  Administered 2018-11-27 – 2018-12-01 (×18): 2 via ORAL
  Filled 2018-11-19: qty 2
  Filled 2018-11-19: qty 1
  Filled 2018-11-19 (×2): qty 2
  Filled 2018-11-19: qty 1
  Filled 2018-11-19 (×4): qty 2
  Filled 2018-11-19: qty 1
  Filled 2018-11-19 (×25): qty 2

## 2018-11-19 MED ORDER — HYDRALAZINE HCL 20 MG/ML IJ SOLN
5.0000 mg | INTRAMUSCULAR | Status: DC | PRN
  Filled 2018-11-19: qty 1

## 2018-11-19 MED ORDER — ALBUTEROL SULFATE HFA 108 (90 BASE) MCG/ACT IN AERS
4.0000 | INHALATION_SPRAY | Freq: Once | RESPIRATORY_TRACT | Status: AC
  Administered 2018-11-19: 4 via RESPIRATORY_TRACT
  Filled 2018-11-19: qty 6.7

## 2018-11-19 MED ORDER — METHYLPREDNISOLONE SODIUM SUCC 125 MG IJ SOLR
60.0000 mg | Freq: Once | INTRAMUSCULAR | Status: AC
  Administered 2018-11-19: 60 mg via INTRAVENOUS
  Filled 2018-11-19: qty 2

## 2018-11-19 MED ORDER — ONDANSETRON HCL 4 MG PO TABS: 4.0000 mg | ORAL_TABLET | Freq: Four times a day (QID) | ORAL | Status: DC | PRN

## 2018-11-19 MED ORDER — ACETAMINOPHEN 650 MG RE SUPP: 650.0000 mg | Freq: Four times a day (QID) | RECTAL | Status: DC | PRN

## 2018-11-19 MED ORDER — SODIUM CHLORIDE 0.9 % IV SOLN
1.0000 g | INTRAVENOUS | Status: AC
  Administered 2018-11-19 – 2018-11-25 (×7): 1 g via INTRAVENOUS
  Filled 2018-11-19 (×2): qty 1
  Filled 2018-11-19: qty 10
  Filled 2018-11-19 (×4): qty 1
  Filled 2018-11-19: qty 10
  Filled 2018-11-19: qty 1

## 2018-11-19 MED ORDER — LEVALBUTEROL HCL 0.63 MG/3ML IN NEBU
0.6300 mg | INHALATION_SOLUTION | Freq: Four times a day (QID) | RESPIRATORY_TRACT | Status: DC
  Administered 2018-11-19: 0.63 mg via RESPIRATORY_TRACT
  Filled 2018-11-19: qty 3

## 2018-11-19 MED ORDER — FOLIC ACID 1 MG PO TABS
1.0000 mg | ORAL_TABLET | Freq: Every day | ORAL | Status: DC
  Administered 2018-11-20 – 2018-12-01 (×12): 1 mg via ORAL
  Filled 2018-11-19 (×12): qty 1

## 2018-11-19 MED ORDER — POTASSIUM CHLORIDE CRYS ER 20 MEQ PO TBCR
40.0000 meq | EXTENDED_RELEASE_TABLET | Freq: Once | ORAL | Status: DC
  Filled 2018-11-19: qty 2

## 2018-11-19 MED ORDER — CHLORHEXIDINE GLUCONATE CLOTH 2 % EX PADS
6.0000 | MEDICATED_PAD | Freq: Every day | CUTANEOUS | Status: DC
  Administered 2018-11-20 – 2018-11-25 (×6): 6 via TOPICAL

## 2018-11-19 MED ORDER — SODIUM CHLORIDE (PF) 0.9 % IJ SOLN
INTRAMUSCULAR | Status: AC
  Filled 2018-11-19: qty 50

## 2018-11-19 MED ORDER — ACETAMINOPHEN 325 MG PO TABS
650.0000 mg | ORAL_TABLET | Freq: Four times a day (QID) | ORAL | Status: DC | PRN
  Filled 2018-11-19: qty 2

## 2018-11-19 MED ORDER — SODIUM CHLORIDE 0.9 % IV SOLN
INTRAVENOUS | Status: DC | PRN
  Administered 2018-11-19 – 2018-11-20 (×2): 250 mL via INTRAVENOUS
  Administered 2018-11-22: 1000 mL via INTRAVENOUS
  Administered 2018-11-24: via INTRAVENOUS

## 2018-11-19 MED ORDER — ATORVASTATIN CALCIUM 40 MG PO TABS
40.0000 mg | ORAL_TABLET | Freq: Every day | ORAL | Status: DC
  Administered 2018-11-20 – 2018-11-30 (×11): 40 mg via ORAL
  Filled 2018-11-19 (×12): qty 1

## 2018-11-19 MED ORDER — FUROSEMIDE 10 MG/ML IJ SOLN
20.0000 mg | Freq: Every day | INTRAMUSCULAR | Status: DC
  Administered 2018-11-19 – 2018-11-20 (×2): 20 mg via INTRAVENOUS
  Filled 2018-11-19 (×2): qty 2

## 2018-11-19 NOTE — ED Notes (Addendum)
Name/Age/Gender Donna Mcneil 78 y.o. female  Code Status Code Status History    Date Active Date Inactive Code Status Order ID Comments User Context   10/17/2018 1215 11/19/2018 1132 DNR [id number]  Heilingoetter, Waldo Laine Outpatient   09/01/2018 2345 09/06/2018 1824 DNR [id number]  Toy Baker, MD Inpatient    Questions for Most Recent Historical Code Status (Order [id number])    Question Answer Comment   In the event of cardiac or respiratory ARREST Do not call a "code blue"    In the event of cardiac or respiratory ARREST Do not perform Intubation, CPR, defibrillation or ACLS    In the event of cardiac or respiratory ARREST Use medication by any route, position, wound care, and other measures to relive pain and suffering. May use oxygen, suction and manual treatment of airway obstruction as needed for comfort.         Advance Directive Documentation     Most Recent Value  Type of Advance Directive  Healthcare Power of Attorney, Living will  Pre-existing out of facility DNR order (yellow form or pink MOST form)  -  "MOST" Form in Place?  -      Home/SNF/Other Home  Chief Complaint Shortness of Breath  Triage Note Pt complaint of sob and cough onset Friday night. Pt receives chemotherapy for stage 4 lung cancer. Denies fever. "Was sent here to be tested in the tent."    Level of Care/Admitting Diagnosis ED Disposition    ED Disposition Condition [place]: [place] [[id number]]  Level of Care: Telemetry [5]  Admit to tele based on following criteria: Monitor QTC interval  Admit to tele based on following criteria: Complex arrhythmia (Bradycardia/Tachycardia)  Diagnosis: Acute respiratory failure with hypoxia [facility]) [[id number]]  Admitting Physician: [place] ERIC J [[id number]]  Attending Physician: [place] ERIC J [[id number]]  Estimated length of stay: 3 - 4 days  Certification:: I certify this patient will need inpatient  services for at least 2 midnights  PT Class (Do Not Modify): Inpatient [101]  PT Acc Code (Do Not Modify): Private [1]       Medical/Surgery History Past Medical History:  Diagnosis Date  . COPD exacerbation ([place]) 09/01/2018  . Diverticulitis   . Hypercholesteremia   . Hypertension   . Lupus ([place])   . Osteoporosis   . Tobacco use    Past Surgical History:  Procedure Laterality Date  . ABDOMINAL HYSTERECTOMY  12/1969  . APPENDECTOMY  1957  . CATARACT EXTRACTION    . CHOLECYSTECTOMY  1997  . IR IMAGING GUIDED PORT INSERTION  10/16/2018  . LAPAROSCOPIC SIGMOID COLECTOMY  2013   secondary to diverticulitis  . spit seed removal    . TONSILLECTOMY  1945     Allergies Allergies  Allergen Reactions  . Codeine Other (See Comments)    GI upset  . Cyclosporine Other (See Comments)    Burning eyes  . Other     Other reaction(s): OTHER  . Pregabalin Other (See Comments)    Personality changes-becomes angry  . Zolpidem     Other reaction(s): ITCHING    IV Location/Drains/Wounds Patient Lines/Drains/Airways Status   Active Line/Drains/Airways    Name:   Placement date:   Placement time:   Site:   Days:   Implanted Port 10/16/18 Right Chest   10/16/18    1505    Chest   34   Chest Tube Lateral;Left Pleural   09/02/18  1230    Pleural   78   Incision (Closed) 10/01/18 Left;Lateral   10/01/18    1151     49          Labs/Imaging Results for orders placed or performed during the hospital encounter of 11/19/18 (from the past 48 hour(s))  Lactic acid, plasma     Status: None   Collection Time: 11/19/18  1:09 PM  Result Value Ref Range   Lactic Acid, Venous 1.3 0.5 - 1.9 mmol/L    Comment: Performed at [facility], [place] [address], [place], [place] [address]  Influenza panel by PCR (type A & B)     Status: None   Collection Time: 11/19/18  1:09 PM  Result Value Ref Range   Influenza A By PCR NEGATIVE NEGATIVE   Influenza B By PCR NEGATIVE NEGATIVE     Comment: (NOTE) The Xpert Xpress Flu assay is intended as an aid in the diagnosis of  influenza and should not be used as a sole basis for treatment.  This  assay is FDA approved for nasopharyngeal swab specimens only. Nasal  washings and aspirates are unacceptable for Xpert Xpress Flu testing. Performed at [facility], [place] [address], [place], [place] [address]   CBC with Differential     Status: Abnormal   Collection Time: 11/19/18  1:11 PM  Result Value Ref Range   WBC 6.9 4.0 - 10.5 K/uL   RBC 4.21 3.87 - 5.11 MIL/uL   Hemoglobin 13.1 12.0 - 15.0 g/dL   HCT 39.7 36.0 - 46.0 %   MCV 94.3 80.0 - 100.0 fL   MCH 31.1 26.0 - 34.0 pg   MCHC 33.0 30.0 - 36.0 g/dL   RDW 18.6 (H) 11.5 - 15.5 %   Platelets 394 150 - 400 K/uL   nRBC 0.4 (H) 0.0 - 0.2 %   Neutrophils Relative % 67 %   Neutro Abs 4.7 1.7 - 7.7 K/uL   Lymphocytes Relative 13 %   Lymphs Abs 0.9 0.7 - 4.0 K/uL   Monocytes Relative 17 %   Monocytes Absolute 1.2 (H) 0.1 - 1.0 K/uL   Eosinophils Relative 1 %   Eosinophils Absolute 0.1 0.0 - 0.5 K/uL   Basophils Relative 1 %   Basophils Absolute 0.0 0.0 - 0.1 K/uL   Immature Granulocytes 1 %   Abs Immature Granulocytes 0.05 0.00 - 0.07 K/uL    Comment: Performed at [facility], [place] [address] [place], [place] [address]  Comprehensive metabolic panel     Status: Abnormal   Collection Time: 11/19/18  1:11 PM  Result Value Ref Range   Sodium 136 135 - 145 mmol/L   Potassium 3.6 3.5 - 5.1 mmol/L   Chloride 98 98 - 111 mmol/L   CO2 27 22 - 32 mmol/L   Glucose, Bld 121 (H) 70 - 99 mg/dL   BUN 19 8 - 23 mg/dL   Creatinine, Ser 0.57 0.44 - 1.00 mg/dL   Calcium 9.3 8.9 - 10.3 mg/dL   Total Protein 7.4 6.5 - 8.1 g/dL   Albumin 2.4 (L) 3.5 - 5.0 g/dL   AST 20 15 - 41 U/L   ALT 20 0 - 44 U/L   Alkaline Phosphatase 94 38 - 126 U/L   Total Bilirubin 0.5 0.3 - 1.2 mg/dL   GFR calc non Af Amer >60 >60 mL/min   GFR calc Af Amer >60 >60  mL/min   Anion gap 11  5 - 15    Comment: Performed at [facility], [place] [address] [place], [place] [address]  Brain natriuretic peptide     Status: Abnormal   Collection Time: 11/19/18  1:12 PM  Result Value Ref Range   B Natriuretic Peptide 540.4 (H) 0.0 - 100.0 pg/mL    Comment: Performed at [facility], [place] [address] [place], [place] [address]  Troponin I - ONCE - STAT     Status: None   Collection Time: 11/19/18  1:17 PM  Result Value Ref Range   Troponin I <0.03 <0.03 ng/mL    Comment: Performed at [facility], [place] [address] [place], [place] [address]   Dg Chest 2 View  Result Date: 11/19/2018 CLINICAL DATA:  Increasing cough, congestion and shortness of breath since 11/15/2017. History of lung cancer. EXAM: CHEST - 2 VIEW COMPARISON:  PA and lateral chest 10/30/2018 and 09/12/2018. PET CT scan 09/20/2018. FINDINGS: The lungs are markedly emphysematous. Small left pleural effusion has slightly increased since the prior exam. There is some left basilar atelectasis. Left upper lobe pulmonary nodule seen on prior CT is not well demonstrated on this exam. No consolidative process pneumothorax. There is cardiomegaly. Aortic atherosclerosis is noted. IMPRESSION: No change in a small left pleural effusion. Subsegmental atelectasis left lung base has slightly increased since the most recent exam. Emphysema. Known left upper lobe pulmonary nodule is not visible on this exam. Cardiomegaly. Atherosclerosis. Electronically Signed   By: Inge Rise M.D.   On: 11/19/2018 12:41   Ct Angio Chest Pe W And/or Wo Contrast  Result Date: 11/19/2018 CLINICAL DATA:  Shortness of breath, cough, lung cancer EXAM: CT ANGIOGRAPHY CHEST WITH CONTRAST TECHNIQUE: Multidetector CT imaging of the chest was performed using the standard protocol during bolus administration of intravenous contrast. Multiplanar CT image reconstructions and MIPs were obtained  to evaluate the vascular anatomy. CONTRAST:  181mL OMNIPAQUE IOHEXOL 350 MG/ML SOLN COMPARISON:  09/01/2018 FINDINGS: Cardiovascular: Satisfactory opacification of the pulmonary arteries to the segmental level. No evidence of pulmonary embolism. Normal heart size. Scattered coronary artery calcifications. Aortic atherosclerosis. No pericardial effusion. Right chest port catheter. Mediastinum/Nodes: Slight interval decrease in size of left hilar and subcarinal lymph nodes. There are new enlarged pretracheal and AP window lymph nodes (series 4, image 42). Thyroid gland, trachea, and esophagus demonstrate no significant findings. Lungs/Pleura: Small left pleural effusion with thickening and nodularity. Interval decrease in size of primary left upper lobe lung malignancy measuring 1.4 x 1.0 cm, previously 1.9 x 1.6 cm when measured similarly (series 4, image 52). There is extensive bilateral peripheral nodular and heterogeneous airspace opacity (series 4, image 67). Underlying emphysema Upper Abdomen: No acute abnormality. Musculoskeletal: No chest wall abnormality. No acute or significant osseous findings. Review of the MIP images confirms the above findings. IMPRESSION: 1.  Negative examination for pulmonary embolism. 2. Redemonstrated stigmata of advanced left lung malignancy. Primary left upper lobe nodule appears decreased in size compared to prior examination with decreased bulk of left hilar and subcarinal lymphadenopathy. There is redemonstrated pleural thickening and nodularity with a reduced volume of pleural effusion. Findings are generally consistent with treatment response. 3. There is new, extensive bilateral peripheral nodular and heterogeneous airspace opacity and new enlarged paratracheal and AP window lymph nodes, this constellation of findings likely reflecting infection and/or edema. 4.  Underlying emphysema. Electronically Signed   By: Eddie Candle M.D.   On: 11/19/2018 15:31   EKG  Interpretation  Date/Time:  Monday November 19 2018 11:39:54 EDT Ventricular Rate:  131 PR Interval:    QRS Duration: 84 QT Interval:  331 QTC Calculation: 442 R Axis:   72 Text Interpretation:  Sinus tachycardia Paired ventricular premature complexes Anteroseptal infarct, age indeterminate ST elevation, consider inferior injury Baseline wander in lead(s) V3 Since prior ECG, rate has increased Confirmed by Gareth Morgan [id number]) on 11/19/2018 3:54:24 PM   Pending Labs Unresulted Labs (From admission, onward)    Start     Ordered   11/19/18 1218  Blood culture (routine x 2)  BLOOD CULTURE X 2,   STAT     11/19/18 1219   Signed and Held  HIV antibody (Routine Screening)  Once,   R     Signed and Held   Signed and Held  Culture, sputum-assessment  Once,   R    Question:  Patient immune status  Answer:  Immunocompromised   Signed and Held   Signed and Held  Gram stain  Once,   R    Question:  Patient immune status  Answer:  Immunocompromised   Signed and Held   Signed and Held  Strep pneumoniae urinary antigen  Once,   R     Signed and Held   Signed and Held  Creatinine, serum  (enoxaparin (LOVENOX)    CrCl >/= 30 ml/min)  Weekly,   R    Comments:  while on enoxaparin therapy    Signed and Held   Signed and Held  Culture, sputum-assessment  Once,   R    Question:  Patient immune status  Answer:  Immunocompromised   Signed and Held   Signed and Held  TSH  Once,   R     Signed and Held   Signed and Held  Basic metabolic panel  Daily,   R     Signed and Held   Signed and Held  CBC  Daily,   R     Signed and Held   Signed and Held  Legionella Pneumophila Serogp 1 Ur Ag  Once,   R     Signed and Held   Signed and Held  Respiratory Panel by PCR  (Respiratory virus panel with precautions)  Once,   R    Question:  Patient immune status  Answer:  Immunocompromised   Signed and Held   Signed and Held  Procalcitonin - Baseline  ONCE - STAT,   STAT     Signed and Held   Signed and Held   Procalcitonin  Daily,   R     Signed and Held   Signed and Held  Magnesium  Daily,   R     Signed and Held   Signed and Held  MRSA PCR Screening  Once,   R    Question:  Patient immune status  Answer:  Immunocompromised   Signed and Held          Vitals/Pain Today's Vitals   11/19/18 1136 11/19/18 1228 11/19/18 1431 11/19/18 1530  BP: (!) 160/93 (!) 154/70 (!) 151/65 (!) 170/90  Pulse: 67 (!) 43 (!) 118 (!) 32  Resp: 20 (!) 27 (!) 22 19  Temp: (!) 97.4 F (36.3 C)     TempSrc: Oral     SpO2: 95% 94% 95% 92%  Weight:      Height:      PainSc:        Intake/Output Last 24 hours No intake or output data in the 24  hours ending 11/19/18 1807  Isolation Precautions Droplet and Contact precautions  Medications Medications  sodium chloride (PF) 0.9 % injection (has no administration in time range)  albuterol (PROVENTIL HFA;VENTOLIN HFA) 108 (90 Base) MCG/ACT inhaler 4 puff (4 puffs Inhalation Given 11/19/18 1552)  sodium chloride 0.9 % bolus 500 mL (500 mLs Intravenous New Bag/Given 11/19/18 1302)  iohexol (OMNIPAQUE) 350 MG/ML injection 100 mL (100 mLs Intravenous Contrast Given 11/19/18 1452)    Mobility walks Low fall risk

## 2018-11-19 NOTE — ED Triage Notes (Addendum)
Pt complaint of sob and cough onset Friday night. Pt receives chemotherapy for stage 4 lung cancer. Denies fever. "Was sent here to be tested in the tent."

## 2018-11-19 NOTE — Telephone Encounter (Addendum)
Trouble breathing/ cough with yellowish green mucous since Friday , temp was 99.1 f up to 99.5 f on Saturday -took tylenol sat night and Sunday at 8 pm . Temp 99.7 this am and now 97 f. Took albuterol just now and breathing is better. Chemo 3/5. Allergic to [facility] and ragweed which is near her appt. Per Julien Nordmann she needs to go to screening tent at ED.Pt will go today around 11:00. Negative travel screen and states she has not been around anyone sick.

## 2018-11-19 NOTE — H&P (Signed)
History and Physical    Donna Mcneil FWY:[id number] DOB: June 26, 1941 DOA: 11/19/2018  PCP: [name], PA  Patient coming from: Home  I have personally briefly reviewed patient's old medical records in [place]  Chief Complaint: Shortness of breath, low-grade fever  HPI: Donna Mcneil is a 78 y.o. 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  Patient reports onset on Thursday with continued progression over this timeframe.  Fevers up to 99.5.  Patient currently on chemotherapy, last dose on October 09, 2018.  She currently lives alone with no sick contacts and no recent travel.  Patient denies any recent changes in her medications.  Also endorses that she has since quit smoking since her diagnosis in January 2020 of lung cancer.  She reports also increased lower extremity edema during this timeframe and associated nausea without vomiting.  Patient is followed by medical oncology, Dr. Earlie Server in the outpatient clinic.  Recently started on chemotherapy October 09, 2018 on carboplatin, Alimta and Avastin.  Recent echocardiogram 09/03/2018 with EF 65-70% with grade 1 diastolic dysfunction, no regional wall abnormalities.  Patient denies headache, no visual changes, no chest pain, no palpitations, no congestion, no sore throat, no abdominal pain, no weakness, no issues with bowel/bladder function, no paresthesias.  ED Course: Temperature 97.4, heart rate 118, BP 138/58, SPO2 92% on 5 L nasal cannula (baseline 2-3L [place]).  WBC 6.9 with lymphocyte count of 0.9, hemoglobin 13.1, platelets 394, sodium 136, potassium 3.6, BUN 19, creatinine 0.57, glucose 121.  BNP 540.4, troponin less than 0.03, lactic acid 1.3, rapid  flu negative, CTPA negative for pulmonary embolism with noted advanced left lung malignancy, pleural effusion and new extensive bilateral peripheral nodular and heterogeneous airspace opacity and new enlarged paratracheal lymph nodes consistent with infection versus edema.  Patient was given a 500 NS bolus and albuterol neb treatment and referred for admission by ED provider for acute on chronic hypoxic respiratory failure.  Review of Systems: As per HPI otherwise 10 point review of systems negative.    Past Medical History:  Diagnosis Date   COPD exacerbation ([place]) 09/01/2018   Diverticulitis    Hypercholesteremia    Hypertension    Lupus ([place])    Osteoporosis    Tobacco use     Past Surgical History:  Procedure Laterality Date   ABDOMINAL HYSTERECTOMY  12/1969   APPENDECTOMY  1957   CATARACT EXTRACTION     CHOLECYSTECTOMY  1997   IR IMAGING GUIDED PORT INSERTION  10/16/2018   LAPAROSCOPIC SIGMOID COLECTOMY  2013   secondary to diverticulitis   spit seed removal     TONSILLECTOMY  1945     reports that she quit smoking about 2 months ago. Her smoking use included cigarettes. She smoked 0.50 packs per day. She has never used smokeless tobacco. She reports that she does not drink alcohol or use drugs.  Allergies  Allergen Reactions   Codeine Other (See Comments)    GI upset   Cyclosporine Other (See Comments)    Burning eyes   Other     Other reaction(s): OTHER   Pregabalin Other (See Comments)    Personality changes-becomes angry   Zolpidem     Other reaction(s): ITCHING    Family History  Problem Relation Age of Onset   Alcohol abuse Mother    Cirrhosis Mother    Heart disease Father    Hypertension Father    Gout Father    Ulcers Father    Hyperlipidemia Father    Alcohol abuse Brother    Heart disease Brother        MI at age 69   OCD Son    Prostatitis Paternal Grandfather    Pneumonia Paternal Grandfather    Cancer Son      Family history reviewed and not pertinent   Prior to Admission medications   Medication Sig Start Date End Date Taking? Authorizing Provider  acetaminophen (TYLENOL) 500 MG tablet Take 1,000 mg by mouth every 6 (six) hours as needed for mild pain.    Yes [provider]  albuterol (PROVENTIL) (2.5 MG/3ML) 0.083% nebulizer solution Take 3 mLs (2.5 mg total) by nebulization every 4 (four) hours as needed for wheezing or shortness of breath. 10/11/18  Yes [name], [name], PA  ALPRAZolam (XANAX) 0.5 MG tablet TAKE 1 TABLET BY MOUTH TWICE A DAY AS NEEDED Patient taking differently: Take 1 mg by mouth at bedtime.  11/05/18  Yes [name], [name], PA  amLODipine (NORVASC) 10 MG tablet TAKE 1 TABLET BY MOUTH ONCE DAILY 10/02/18  Yes [name], [name], PA  aspirin 81 MG tablet Take 81 mg by mouth daily.   Yes [provider]  atorvastatin (LIPITOR) 40 MG tablet TAKE 1 TABLET BY MOUTH ONCE DAILY Patient taking differently: Take 40 mg by mouth every evening.  05/14/18  Yes [name], [name], PA  b complex vitamins tablet Take 2 tablets by mouth 2 (two) times daily.   Yes [provider]  Biotin (BIOTIN 5000) 5 MG CAPS Take 5,000 mg by mouth daily.   Yes [provider]  Calcium Carbonate-Vitamin D (CALCIUM 600+D) 600-200 MG-UNIT TABS Take 1 tablet by mouth 2 (two) times daily.    Yes [provider]  carboxymethylcellulose (REFRESH TEARS) 0.5 % SOLN Place 1 drop into both eyes daily as needed (dry eyes).    Yes [provider]  Coenzyme Q10 (COQ10) 100 MG CAPS Take 100 mg by mouth daily.   Yes [provider]  folic acid (FOLVITE) 1 MG tablet Take 1 tablet (1 mg total) by mouth daily. 09/26/18  Yes Curt Bears, MD  gabapentin (NEURONTIN) 100 MG capsule Take 100 mg by mouth 3 (three) times daily.  09/08/15  Yes [provider]  guaiFENesin (MUCINEX) 600 MG 12 hr tablet Take 1 tablet (600 mg total) by mouth 2 (two) times daily.  09/06/18  Yes Shelly Coss, MD  hydrochlorothiazide (HYDRODIURIL) 50 MG tablet TAKE 1 TABLET BY MOUTH ONCE DAILY 11/13/18  Yes [name], [name], PA  Krill Oil 500 MG CAPS Take 500 mg by mouth daily.   Yes [provider]  lidocaine-prilocaine (EMLA) cream Apply 1 application topically as needed. 09/26/18  Yes Curt Bears, MD  methotrexate (RHEUMATREX) 2.5 MG tablet Take 15 mg by mouth every Monday.  10/01/15  Yes [provider]  Multiple Vitamins-Minerals (MULTIVITAMIN ADULT PO) Take 1 tablet by mouth daily.    Yes [provider]  Omega 3 1000 MG CAPS Take 1,000-2,000 mg by mouth See admin instructions. Take 2000 mg in the morning and 1000 mg at night   Yes [provider]  potassium chloride SA (K-DUR,KLOR-CON) 20 MEQ tablet Take 1 tablet (20 mEq total) by mouth daily. 11/14/18  Yes  Curt Bears, MD  [facility] HFA [phone] Base) MCG/ACT inhaler Inhale 2 puffs into the lungs every 6 (six) hours as needed for wheezing or shortness of breath. 09/27/18  Yes [name], [name], PA  prochlorperazine (COMPAZINE) 10 MG tablet Take 1 tablet (10 mg total) by mouth every 6 (six) hours as needed for nausea or vomiting. 09/26/18  Yes Curt Bears, MD  umeclidinium-vilanterol [facility] ELLIPTA) 62.5-25 MCG/INH AEPB Inhale 1 puff into the lungs daily. 09/12/18  Yes Martyn Ehrich, NP    Physical Exam: Vitals:   11/19/18 1228 11/19/18 1431 11/19/18 1530 11/19/18 1800  BP: (!) 154/70 (!) 151/65 (!) 170/90 (!) 166/66  Pulse: (!) 43 (!) 118 (!) 32 (!) 118  Resp: (!) 27 (!) 22 19 (!) 31  Temp:      TempSrc:      SpO2: 94% 95% 92% 91%  Weight:      Height:        Constitutional: Mild respiratory distress Vitals:   11/19/18 1228 11/19/18 1431 11/19/18 1530 11/19/18 1800  BP: (!) 154/70 (!) 151/65 (!) 170/90 (!) 166/66  Pulse: (!) 43 (!) 118 (!) 32 (!) 118  Resp: (!) 27 (!) 22 19 (!) 31  Temp:      TempSrc:      SpO2: 94% 95% 92% 91%  Weight:      Height:         Eyes: PERRL, lids and conjunctivae normal ENMT: Mucous membranes are moist. Posterior pharynx clear of any exudate or lesions.Normal dentition.  Neck: normal, supple, no masses, no thyromegaly Respiratory: Diffuse inspiratory/expiratory wheezing, lung sounds slightly decreased bilateral bases with crackles, normal respiratory effort without accessory muscle use, on 5 L nasal cannula satting 92%.   Cardiovascular: Tachycardia, irregularly irregular rhythm, no murmurs / rubs / gallops.  Bilateral peripheral edema, right lower extremity slightly more edematous than left lower extremity. Abdomen: no tenderness, no masses palpated. No hepatosplenomegaly. Bowel sounds positive.  Musculoskeletal: no clubbing / cyanosis. No joint deformity upper and lower extremities. Good ROM, no contractures. Normal muscle tone.  Skin: no rashes, lesions, ulcers. No induration Neurologic: CN 2-12 grossly intact. Sensation intact, DTR normal. Strength 5/5 in all 4.  Psychiatric: Normal judgment and insight. Alert and oriented x 3. Normal mood.   Labs on Admission: I have personally reviewed following labs and imaging studies  CBC: Recent Labs  Lab 11/14/18 1134 11/19/18 1311  WBC 10.2 6.9  NEUTROABS 7.3 4.7  HGB 10.0* 13.1  HCT 29.1* 39.7  MCV 89.8 94.3  PLT 159 109   Basic Metabolic Panel: Recent Labs  Lab 11/14/18 1134 11/19/18 1311  NA 134* 136  K 3.0* 3.6  CL 95* 98  CO2 28 27  GLUCOSE 111* 121*  BUN 11 19  CREATININE 0.63 0.57  CALCIUM 8.8* 9.3   GFR: Estimated Creatinine Clearance: 45.8 mL/min (by C-G formula based on SCr of 0.57 mg/dL). Liver Function Tests: Recent Labs  Lab 11/14/18 1134 11/19/18 1311  AST 22 20  ALT 28 20  ALKPHOS 98 94  BILITOT 0.5 0.5  PROT 7.0 7.4  ALBUMIN 2.3* 2.4*   No results for input(s): LIPASE, AMYLASE in the last 168 hours. No results for input(s): AMMONIA in the last 168 hours. Coagulation Profile: No results for input(s): INR, PROTIME in the  last 168 hours. Cardiac Enzymes: Recent Labs  Lab 11/19/18 1317  TROPONINI <0.03   BNP (last 3 results) No results for input(s): PROBNP in the last 8760 hours. HbA1C:  No results for input(s): HGBA1C in the last 72 hours. CBG: No results for input(s): GLUCAP in the last 168 hours. Lipid Profile: No results for input(s): CHOL, HDL, LDLCALC, TRIG, CHOLHDL, LDLDIRECT in the last 72 hours. Thyroid Function Tests: No results for input(s): TSH, T4TOTAL, FREET4, T3FREE, THYROIDAB in the last 72 hours. Anemia Panel: No results for input(s): VITAMINB12, FOLATE, FERRITIN, TIBC, IRON, RETICCTPCT in the last 72 hours. Urine analysis:    Component Value Date/Time   COLORURINE YELLOW 09/02/2018 0642   APPEARANCEUR CLEAR 09/02/2018 0642   APPEARANCEUR Cloudy 02/17/2012 1142   LABSPEC >1.046 (H) 09/02/2018 0642   LABSPEC 1.028 02/17/2012 1142   PHURINE 6.0 09/02/2018 0642   GLUCOSEU NEGATIVE 09/02/2018 0642   GLUCOSEU Negative 02/17/2012 1142   HGBUR NEGATIVE 09/02/2018 0642   BILIRUBINUR NEGATIVE 09/02/2018 0642   BILIRUBINUR Negative 02/17/2012 1142   KETONESUR 5 (A) 09/02/2018 0642   PROTEINUR <30 (A) 11/01/2018 1139   NITRITE NEGATIVE 09/02/2018 0642   LEUKOCYTESUR NEGATIVE 09/02/2018 0642   LEUKOCYTESUR Negative 02/17/2012 1142    Radiological Exams on Admission: Dg Chest 2 View  Result Date: 11/19/2018 CLINICAL DATA:  Increasing cough, congestion and shortness of breath since 11/15/2017. History of lung cancer. EXAM: CHEST - 2 VIEW COMPARISON:  PA and lateral chest 10/30/2018 and 09/12/2018. PET CT scan 09/20/2018. FINDINGS: The lungs are markedly emphysematous. Small left pleural effusion has slightly increased since the prior exam. There is some left basilar atelectasis. Left upper lobe pulmonary nodule seen on prior CT is not well demonstrated on this exam. No consolidative process pneumothorax. There is cardiomegaly. Aortic atherosclerosis is noted. IMPRESSION: No change in a  small left pleural effusion. Subsegmental atelectasis left lung base has slightly increased since the most recent exam. Emphysema. Known left upper lobe pulmonary nodule is not visible on this exam. Cardiomegaly. Atherosclerosis. Electronically Signed   By: Inge Rise M.D.   On: 11/19/2018 12:41   Ct Angio Chest Pe W And/or Wo Contrast  Result Date: 11/19/2018 CLINICAL DATA:  Shortness of breath, cough, lung cancer EXAM: CT ANGIOGRAPHY CHEST WITH CONTRAST TECHNIQUE: Multidetector CT imaging of the chest was performed using the standard protocol during bolus administration of intravenous contrast. Multiplanar CT image reconstructions and MIPs were obtained to evaluate the vascular anatomy. CONTRAST:  177mL OMNIPAQUE IOHEXOL 350 MG/ML SOLN COMPARISON:  09/01/2018 FINDINGS: Cardiovascular: Satisfactory opacification of the pulmonary arteries to the segmental level. No evidence of pulmonary embolism. Normal heart size. Scattered coronary artery calcifications. Aortic atherosclerosis. No pericardial effusion. Right chest port catheter. Mediastinum/Nodes: Slight interval decrease in size of left hilar and subcarinal lymph nodes. There are new enlarged pretracheal and AP window lymph nodes (series 4, image 42). Thyroid gland, trachea, and esophagus demonstrate no significant findings. Lungs/Pleura: Small left pleural effusion with thickening and nodularity. Interval decrease in size of primary left upper lobe lung malignancy measuring 1.4 x 1.0 cm, previously 1.9 x 1.6 cm when measured similarly (series 4, image 52). There is extensive bilateral peripheral nodular and heterogeneous airspace opacity (series 4, image 67). Underlying emphysema Upper Abdomen: No acute abnormality. Musculoskeletal: No chest wall abnormality. No acute or significant osseous findings. Review of the MIP images confirms the above findings. IMPRESSION: 1.  Negative examination for pulmonary embolism. 2. Redemonstrated stigmata of advanced  left lung malignancy. Primary left upper lobe nodule appears decreased in size compared to prior examination with decreased bulk of left hilar and subcarinal lymphadenopathy. There is redemonstrated pleural thickening and nodularity with a reduced volume of  pleural effusion. Findings are generally consistent with treatment response. 3. There is new, extensive bilateral peripheral nodular and heterogeneous airspace opacity and new enlarged paratracheal and AP window lymph nodes, this constellation of findings likely reflecting infection and/or edema. 4.  Underlying emphysema. Electronically Signed   By: Eddie Candle M.D.   On: 11/19/2018 15:31    EKG: Independently reviewed.  Sinus tachycardia with occasional PVCs  Assessment/Plan Principal Problem:   Acute respiratory failure with hypoxia (HCC) Active Problems:   Seropositive rheumatoid arthritis (HCC)   Hyperlipidemia   Hypertension   Cutaneous lupus erythematosus   COPD (chronic obstructive pulmonary disease) (HCC)   Chronic respiratory failure with hypoxia (HCC)   Hyponatremia   Malignant pleural effusion   Adenocarcinoma of left lung, stage 4 (HCC)   Port-A-Cath in place   Acute on chronic hypoxic respiratory failure COPD exacerbation Acute on chronic diastolic congestive heart failure Community-acquired pneumonia Patient presenting with progressive dyspnea associated with possibly worsening bilateral lower extremity edema as well as productive cough with yellow/green sputum.  Etiology likely multifactorial with history of newly diagnosed non-small cell lung cancer in combination with likely COPD exacerbation vs diastolic CHF exacerbation vs possible community-acquired pneumonia.  Patient is usually on 2-3 L/min, now requiring 5 L to maintain adequate oxygenation.  Very low suspicion for SARS-COV-2 infection as she has had no sick contacts, no recent travel, with normal lymphocyte count.  BNP was notably elevated at 540 with a lactic  acid of 1.3 and negative rapid influenza.  CTPA negative for pulmonary embolism and no multifocal pneumonia appreciated. --Admit to med telemetry --Check respiratory viral panel, procalcitonin --Sputum culture --Blood cultures x2: Pending --Urine strep pneumonia/Legionella antigen --Respiratory therapy for duo nebs, albuterol as needed --Prednisone 40 mg p.o. daily x5 days --Start antibiotics with azithromycin and ceftriaxone --Recent echo January 2020 with EF 65-70% with grade 1 diastolic dysfunction and no regional wall abnormalities; will not repeat at this time --Lasix 20 mg IV daily x 3 days --Continue to monitor strict I's and O's and daily weights --Repeat BMP with magnesium in a.m. --Supportive care  Sinus tachycardia Patient with sinus tachycardia with occasional PVCs, appears to have some intermittent atrial fibrillation. --Start low-dose metoprolol tartrate 12.5 mg p.o. twice daily --Check TSH/free T4 --Continue to monitor on telemetry --Repeat EKG in the a.m.  Chronic bilateral lower extremity edema Patient reports slightly worsening lower extremity edema over the past few weeks.  Recently underwent bilateral US duplex venous Dopplers on 09/04/2023 DVT. --Right lower extremity slightly more edematous than the left lower extremity --Will not repeat Dopplers at this time --Continue to monitor  Stage IV non-small cell lung cancer Follows with oncology, [facility] outpatient. Port in place.  Started first dose of chemotherapy October 09, 2018 with carboplatin, alimta, and avastin.  --Continue outpatient follow-up  Hypokalemia Potassium 3.6 on admission.  Will replete --Daily BMP with magnesium  SLE Rheumatoid arthritis On methotrexate 15 mg every Monday.  Will hold for now --Continue folic acid 1 mg daily  History of tobacco use disorder Patient recently quit smoking in January after diagnosis of lung cancer.  Patient smoked since the age of 35.  Encouraged her to  continue efforts for tobacco cessation.  DVT prophylaxis: Lovenox Code Status: DNR Family Communication: None, patient lives alone in [place], only support is ex-husband and grandson. Disposition Plan: Admit to inpatient, likely discharge back home when medically stable Consults called: None Admission status: Inpatient   Wynn Kernes J [place] DO [facility]  Pager [phone]  If 7PM-7AM, please contact night-coverage [email or web address] Password [facility]  11/19/2018, 7:02 PM

## 2018-11-19 NOTE — ED Provider Notes (Signed)
[facility] DEPT Provider Note   CSN: [id number] Arrival date & time: 11/19/18  1132    History   Chief Complaint Chief Complaint  Patient presents with  . Shortness of Breath  . Cough  . Cancer    HPI Donna Mcneil is a 78 y.o. female.     HPI   Difficulty breathing started Friday, has always had some difficulty, on 3L COPD, lung cancer, but not difficulty breathing like this before Difficulty eating because of difficulty breathing Every time eat, nausea Coughing up yellow mucus, that has been off and on for along time.  Temperature 99.1-99.5, then down to normal after taking tylenol Leg swelling about the same More dyspnea when laying flat, chronic since before November Using breathing treatments at home, was helping for little bit but then symptoms come back Taking anoro    Past Medical History:  Diagnosis Date  . COPD exacerbation ([place]) 09/01/2018  . Diverticulitis   . Hypercholesteremia   . Hypertension   . Lupus ([place])   . Osteoporosis   . Tobacco use     Patient Active Problem List   Diagnosis Date Noted  . Acute respiratory failure with hypoxia ([place]) 11/19/2018  . Port-A-Cath in place 10/24/2018  . Adenocarcinoma of left lung, stage 4 ([place]) 09/26/2018  . Goals of care, counseling/discussion 09/26/2018  . Encounter for antineoplastic chemotherapy 09/26/2018  . Community acquired pneumonia of left lower lobe of lung ([place])   . Mass of upper lobe of left lung 09/02/2018  . COPD (chronic obstructive pulmonary disease) ([place]) 09/01/2018  . Chronic respiratory failure with hypoxia ([place]) 09/01/2018  . Hyponatremia 09/01/2018  . 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  . 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 Rheumatoid arthritis involving multiple sites with positive rheumatoid factor ([place]) 12/12/2017  . Tobacco use 04/14/2017  . Carotid stenosis 04/14/2017  . Peripheral neuropathy 06/24/2016  . Phobic anxiety  disorder 11/13/2015  . Chronic bronchitis ([place]) 11/13/2015  . Osteoarthritis 10/05/2015  . Hyperlipidemia 10/05/2015  . Hypertension 10/05/2015  . Osteoporosis 10/05/2015  . Acid reflux 10/05/2015  . Peptic ulcer 10/05/2015  . Cataract 10/05/2015  . Tendinitis 10/05/2015  . Osteopenia 02/11/2014  . Seropositive rheumatoid arthritis ([place]) 02/11/2014    Past Surgical History:  Procedure Laterality Date  . ABDOMINAL HYSTERECTOMY  12/1969  . APPENDECTOMY  1957  . CATARACT EXTRACTION    . CHOLECYSTECTOMY  1997  . IR IMAGING GUIDED PORT INSERTION  10/16/2018  . LAPAROSCOPIC SIGMOID COLECTOMY  2013   secondary to diverticulitis  . spit seed removal    . TONSILLECTOMY  1945     OB History   No obstetric history on file.      Home Medications    Prior to Admission medications   Medication Sig Start Date End Date Taking? Authorizing Provider  acetaminophen (TYLENOL) 500 MG tablet Take 1,000 mg by mouth every 6 (six) hours as needed for mild pain.    Yes [provider]  albuterol (PROVENTIL) (2.5 MG/3ML) 0.083% nebulizer solution Take 3 mLs (2.5 mg total) by nebulization every 4 (four) hours as needed for wheezing or shortness of breath. 10/11/18  Yes [name], [name], PA  ALPRAZolam (XANAX) 0.5 MG tablet TAKE 1 TABLET BY MOUTH TWICE A DAY AS NEEDED Patient taking differently: Take 1 mg by mouth at bedtime.  11/05/18  Yes [name], [name], PA  amLODipine (NORVASC) 10 MG tablet TAKE 1 TABLET BY MOUTH ONCE DAILY 10/02/18  Yes [name], [name],  PA  aspirin 81 MG tablet Take 81 mg by mouth daily.   Yes [provider]  atorvastatin (LIPITOR) 40 MG tablet TAKE 1 TABLET BY MOUTH ONCE DAILY Patient taking differently: Take 40 mg by mouth every evening.  05/14/18  Yes [name], [name], PA  b complex vitamins tablet Take 2 tablets by mouth 2 (two) times daily.   Yes [provider]  Biotin (BIOTIN 5000) 5 MG CAPS Take 5,000 mg by mouth daily.   Yes [provider]  Calcium Carbonate-Vitamin D (CALCIUM 600+D) 600-200 MG-UNIT TABS Take 1 tablet by mouth 2 (two) times daily.    Yes [provider]  carboxymethylcellulose (REFRESH TEARS) 0.5 % SOLN Place 1 drop into both eyes daily as needed (dry eyes).    Yes [provider]  Coenzyme Q10 (COQ10) 100 MG CAPS Take 100 mg by mouth daily.   Yes [provider]  folic acid (FOLVITE) 1 MG tablet Take 1 tablet (1 mg total) by mouth daily. 09/26/18  Yes Curt Bears, MD  gabapentin (NEURONTIN) 100 MG capsule Take 100 mg by mouth 3 (three) times daily.  09/08/15  Yes [provider]  guaiFENesin (MUCINEX) 600 MG 12 hr tablet Take 1 tablet (600 mg total) by mouth 2 (two) times daily. 09/06/18  Yes Shelly Coss, MD  hydrochlorothiazide (HYDRODIURIL) 50 MG tablet TAKE 1 TABLET BY MOUTH ONCE DAILY 11/13/18  Yes [name], [name], PA  Krill Oil 500 MG CAPS Take 500 mg by mouth daily.   Yes [provider]  lidocaine-prilocaine (EMLA) cream Apply 1 application topically as needed. 09/26/18  Yes Curt Bears, MD  methotrexate (RHEUMATREX) 2.5 MG tablet Take 15 mg by mouth every Monday.  10/01/15  Yes [provider]  Multiple Vitamins-Minerals (MULTIVITAMIN ADULT PO) Take 1 tablet by mouth daily.    Yes [provider]  Omega 3 1000 MG CAPS Take 1,000-2,000 mg by mouth See admin instructions. Take 2000 mg in the morning and 1000 mg at night   Yes [provider]  potassium chloride SA (K-DUR,KLOR-CON) 20 MEQ tablet Take 1 tablet (20 mEq total) by mouth daily. 11/14/18  Yes Curt Bears, MD  PROAIR HFA 108 [phone] Base) MCG/ACT inhaler Inhale 2 puffs into the lungs every 6 (six) hours as needed for wheezing or shortness of breath. 09/27/18  Yes [name], [name], PA  prochlorperazine (COMPAZINE) 10 MG tablet Take 1 tablet (10 mg total) by mouth every 6 (six) hours as needed for nausea or vomiting. 09/26/18  Yes Curt Bears, MD   umeclidinium-vilanterol [facility] ELLIPTA) 62.5-25 MCG/INH AEPB Inhale 1 puff into the lungs daily. 09/12/18  Yes Martyn Ehrich, NP    Family History Family History  Problem Relation Age of Onset  . Alcohol abuse Mother   . Cirrhosis Mother   . Heart disease Father   . Hypertension Father   . Gout Father   . Ulcers Father   . Hyperlipidemia Father   . Alcohol abuse Brother   . Heart disease Brother        MI at age 78  . OCD Son   . Prostatitis Paternal Grandfather   . Pneumonia Paternal Grandfather   . Cancer Son     Social History Social History   Tobacco Use  . Smoking status: Former Smoker    Packs/day: 0.50    Types: Cigarettes    Last attempt to quit: 09/01/2018    Years since quitting: 0.2  . Smokeless tobacco:  Never Used  . Tobacco comment: started smoking at age 39  Substance Use Topics  . Alcohol use: No    Alcohol/week: 0.0 standard drinks  . Drug use: No     Allergies   Codeine; Cyclosporine; Other; Pregabalin; and Zolpidem   Review of Systems Review of Systems  Constitutional: Positive for fatigue (chronic). Fever: 99.5.  HENT: Negative for congestion and sore throat.   Respiratory: Positive for cough and shortness of breath.   Cardiovascular: Negative for chest pain.  Gastrointestinal: Positive for nausea. Negative for abdominal pain, constipation, diarrhea and vomiting.  Genitourinary: Negative for dysuria.  Skin: Negative for rash.  Neurological: Positive for light-headedness and headaches. Negative for syncope.     Physical Exam Updated Vital Signs BP 140/68   Pulse (!) 113   Temp 98.3 F (36.8 C) (Axillary)   Resp (!) 21   Ht 5\' 2"  (1.575 m)   Wt 56.8 kg   SpO2 99%   BMI 22.90 kg/m   Physical Exam Vitals signs and nursing note reviewed.  Constitutional:      General: She is not in acute distress.    Appearance: She is well-developed. She is ill-appearing.  HENT:     Head: Normocephalic and atraumatic.  Eyes:      Conjunctiva/sclera: Conjunctivae normal.  Neck:     Musculoskeletal: Normal range of motion.  Cardiovascular:     Rate and Rhythm: Normal rate and regular rhythm.     Heart sounds: Normal heart sounds. No murmur. No friction rub. No gallop.   Pulmonary:     Effort: Pulmonary effort is normal. Tachypnea present. No respiratory distress.     Breath sounds: Rales (bibasilar) present. No wheezing.  Abdominal:     General: There is no distension.     Palpations: Abdomen is soft.     Tenderness: There is no abdominal tenderness. There is no guarding.  Musculoskeletal:        General: No tenderness.  Skin:    General: Skin is warm.     Findings: No erythema or rash.  Neurological:     Mental Status: She is alert and oriented to person, place, and time.      ED Treatments / Results  Labs (all labs ordered are listed, but only abnormal results are displayed) Labs Reviewed  CBC WITH DIFFERENTIAL/PLATELET - Abnormal; Notable for the following components:      Result Value   RDW 18.6 (*)    nRBC 0.4 (*)    Monocytes Absolute 1.2 (*)    All other components within normal limits  COMPREHENSIVE METABOLIC PANEL - Abnormal; Notable for the following components:   Glucose, Bld 121 (*)    Albumin 2.4 (*)    All other components within normal limits  BRAIN NATRIURETIC PEPTIDE - Abnormal; Notable for the following components:   B Natriuretic Peptide 540.4 (*)    All other components within normal limits  MRSA PCR SCREENING  CULTURE, BLOOD (ROUTINE X 2)  CULTURE, BLOOD (ROUTINE X 2)  EXPECTORATED SPUTUM ASSESSMENT W REFEX TO RESP CULTURE  GRAM STAIN  EXPECTORATED SPUTUM ASSESSMENT W REFEX TO RESP CULTURE  RESPIRATORY PANEL BY PCR  NOVEL CORONAVIRUS, NAA (HOSPITAL ORDER, SEND-OUT TO REF LAB)  RESPIRATORY PANEL BY PCR  LACTIC ACID, PLASMA  INFLUENZA PANEL BY PCR (TYPE A & B)  TROPONIN I  TSH  PROCALCITONIN  T4, FREE  HIV ANTIBODY (ROUTINE TESTING W REFLEX)  STREP PNEUMONIAE URINARY  ANTIGEN  BASIC METABOLIC PANEL  CBC  LEGIONELLA PNEUMOPHILA SEROGP 1 UR AG  PROCALCITONIN  MAGNESIUM    EKG EKG Interpretation  Date/Time:  Monday November 19 2018 11:39:54 EDT Ventricular Rate:  131 PR Interval:    QRS Duration: 84 QT Interval:  331 QTC Calculation: 442 R Axis:   72 Text Interpretation:  Sinus tachycardia Paired ventricular premature complexes Anteroseptal infarct, age indeterminate ST elevation, consider inferior injury Baseline wander in lead(s) V3 Since prior ECG, rate has increased Confirmed by Gareth Morgan [phone]) on 11/19/2018 3:54:24 PM   Radiology Dg Chest 2 View  Result Date: 11/19/2018 CLINICAL DATA:  Increasing cough, congestion and shortness of breath since 11/15/2017. History of lung cancer. EXAM: CHEST - 2 VIEW COMPARISON:  PA and lateral chest 10/30/2018 and 09/12/2018. PET CT scan 09/20/2018. FINDINGS: The lungs are markedly emphysematous. Small left pleural effusion has slightly increased since the prior exam. There is some left basilar atelectasis. Left upper lobe pulmonary nodule seen on prior CT is not well demonstrated on this exam. No consolidative process pneumothorax. There is cardiomegaly. Aortic atherosclerosis is noted. IMPRESSION: No change in a small left pleural effusion. Subsegmental atelectasis left lung base has slightly increased since the most recent exam. Emphysema. Known left upper lobe pulmonary nodule is not visible on this exam. Cardiomegaly. Atherosclerosis. Electronically Signed   By: Inge Rise M.D.   On: 11/19/2018 12:41   Ct Angio Chest Pe W And/or Wo Contrast  Result Date: 11/19/2018 CLINICAL DATA:  Shortness of breath, cough, lung cancer EXAM: CT ANGIOGRAPHY CHEST WITH CONTRAST TECHNIQUE: Multidetector CT imaging of the chest was performed using the standard protocol during bolus administration of intravenous contrast. Multiplanar CT image reconstructions and MIPs were obtained to evaluate the vascular anatomy.  CONTRAST:  166mL OMNIPAQUE IOHEXOL 350 MG/ML SOLN COMPARISON:  09/01/2018 FINDINGS: Cardiovascular: Satisfactory opacification of the pulmonary arteries to the segmental level. No evidence of pulmonary embolism. Normal heart size. Scattered coronary artery calcifications. Aortic atherosclerosis. No pericardial effusion. Right chest port catheter. Mediastinum/Nodes: Slight interval decrease in size of left hilar and subcarinal lymph nodes. There are new enlarged pretracheal and AP window lymph nodes (series 4, image 42). Thyroid gland, trachea, and esophagus demonstrate no significant findings. Lungs/Pleura: Small left pleural effusion with thickening and nodularity. Interval decrease in size of primary left upper lobe lung malignancy measuring 1.4 x 1.0 cm, previously 1.9 x 1.6 cm when measured similarly (series 4, image 52). There is extensive bilateral peripheral nodular and heterogeneous airspace opacity (series 4, image 67). Underlying emphysema Upper Abdomen: No acute abnormality. Musculoskeletal: No chest wall abnormality. No acute or significant osseous findings. Review of the MIP images confirms the above findings. IMPRESSION: 1.  Negative examination for pulmonary embolism. 2. Redemonstrated stigmata of advanced left lung malignancy. Primary left upper lobe nodule appears decreased in size compared to prior examination with decreased bulk of left hilar and subcarinal lymphadenopathy. There is redemonstrated pleural thickening and nodularity with a reduced volume of pleural effusion. Findings are generally consistent with treatment response. 3. There is new, extensive bilateral peripheral nodular and heterogeneous airspace opacity and new enlarged paratracheal and AP window lymph nodes, this constellation of findings likely reflecting infection and/or edema. 4.  Underlying emphysema. Electronically Signed   By: Eddie Candle M.D.   On: 11/19/2018 15:31    Procedures .Critical Care Performed by:  Gareth Morgan, MD Authorized by: Gareth Morgan, MD   Critical care provider statement:    Critical care time (minutes):  30   Critical care was necessary to treat  or prevent imminent or life-threatening deterioration of the following conditions:  Respiratory failure   Critical care was time spent personally by me on the following activities:  Pulse oximetry, ordering and review of radiographic studies, ordering and review of laboratory studies, evaluation of patient's response to treatment and examination of patient   (including critical care time)  Medications Ordered in ED Medications  sodium chloride (PF) 0.9 % injection (has no administration in time range)  aspirin EC tablet 81 mg (has no administration in time range)  amLODipine (NORVASC) tablet 10 mg (has no administration in time range)  atorvastatin (LIPITOR) tablet 40 mg (40 mg Oral Not Given 11/19/18 2115)  hydrochlorothiazide (HYDRODIURIL) tablet 50 mg (has no administration in time range)  ALPRAZolam Duanne Moron) tablet 1 mg (1 mg Oral Not Given 11/19/18 2115)  prochlorperazine (COMPAZINE) tablet 10 mg (has no administration in time range)  folic acid (FOLVITE) tablet 1 mg (has no administration in time range)  gabapentin (NEURONTIN) capsule 100 mg (100 mg Oral Not Given 11/19/18 2115)  umeclidinium-vilanterol (ANORO ELLIPTA) 62.5-25 MCG/INH 1 puff (has no administration in time range)  enoxaparin (LOVENOX) injection 40 mg (40 mg Subcutaneous Given 11/19/18 2137)  acetaminophen (TYLENOL) tablet 650 mg (has no administration in time range)    Or  acetaminophen (TYLENOL) suppository 650 mg (has no administration in time range)  HYDROcodone-acetaminophen (NORCO/VICODIN) 5-325 MG per tablet 1-2 tablet (has no administration in time range)  polyethylene glycol (MIRALAX / GLYCOLAX) packet 17 g (has no administration in time range)  ondansetron (ZOFRAN) tablet 4 mg (has no administration in time range)    Or  ondansetron (ZOFRAN)  injection 4 mg (has no administration in time range)  cefTRIAXone (ROCEPHIN) 1 g in sodium chloride 0.9 % 100 mL IVPB (0 g Intravenous Stopped 11/19/18 2226)  azithromycin (ZITHROMAX) tablet 500 mg (500 mg Oral Not Given 11/19/18 2115)  predniSONE (DELTASONE) tablet 40 mg (has no administration in time range)  metoprolol tartrate (LOPRESSOR) tablet 12.5 mg (12.5 mg Oral Not Given 11/19/18 2115)  feeding supplement (ENSURE ENLIVE) (ENSURE ENLIVE) liquid 237 mL (has no administration in time range)  furosemide (LASIX) injection 20 mg (20 mg Intravenous Given 11/19/18 2005)  potassium chloride SA (K-DUR,KLOR-CON) CR tablet 40 mEq (40 mEq Oral Not Given 11/19/18 2115)  polyvinyl alcohol (LIQUIFILM TEARS) 1.4 % ophthalmic solution 1 drop (has no administration in time range)  levalbuterol (XOPENEX HFA) inhaler 1 puff (has no administration in time range)  0.9 %  sodium chloride infusion (250 mLs Intravenous New Bag/Given 11/19/18 2155)  hydrALAZINE (APRESOLINE) injection 5 mg (has no administration in time range)  Chlorhexidine Gluconate Cloth 2 % PADS 6 each (has no administration in time range)  albuterol (PROVENTIL HFA;VENTOLIN HFA) 108 (90 Base) MCG/ACT inhaler 4 puff (4 puffs Inhalation Given 11/19/18 1552)  sodium chloride 0.9 % bolus 500 mL (500 mLs Intravenous New Bag/Given 11/19/18 1302)  iohexol (OMNIPAQUE) 350 MG/ML injection 100 mL (100 mLs Intravenous Contrast Given 11/19/18 1452)  methylPREDNISolone sodium succinate (SOLU-MEDROL) 125 mg/2 mL injection 60 mg (60 mg Intravenous Given 11/19/18 2005)     Initial Impression / Assessment and Plan / ED Course  I have reviewed the triage vital signs and the nursing notes.  Pertinent labs & imaging results that were available during my care of the patient were reviewed by me and considered in my medical decision making (see chart for details).        78 year old female with a history of stage IV lung adenocarcinoma,  COPD, hypertension,  hyperlipidemia, lupus, rheumatoid arthritis, presents with concern for shortness of breath and cough.  Differential diagnosis includes COPD exacerbation, pulmonary embolus, pneumonia, viral pneumonia/COVID19, CHF exacerbation.  CBC shows no sign of anemia.  Chest x-ray with no change in left pleural effusion.  Lactic acid within normal limits.  Given high risk for pulmonary embolus, CT PE study was done. Given concern for elevated temperature, respiratory symptoms, sent influenza swab.   CT with peripheral nodula and herogeneous airspace opacities, likely indicating infection and or edema.  BNP is elevated, worsening hypoxia after 500cc fluid order for initial concern for tachycardia and diminished po intake/decreased intravascular volume.  Tachycardia present, on telemetry appears to be atrial fibrillation, ECG looks ectopic atrial rhythm which also may contribute to symptoms.  Pt on contact precautions, hospitalist to admit for continued care, will make decision about further testing/COVD.    Final Clinical Impressions(s) / ED Diagnoses   Final diagnoses:  Shortness of breath  Hypoxia  Tachycardia    ED Discharge Orders    None       Gareth Morgan, MD 11/20/18 [phone]

## 2018-11-19 NOTE — ED Notes (Addendum)
PT Husband request CA MD made aware PT currently in ED

## 2018-11-19 NOTE — Progress Notes (Signed)
Patient has several PO medications. Paged Baltazar Najjar to see if we needed any PO medications switched to IV due to patient being placed on BiPAP. Will continue to monitor.

## 2018-11-19 NOTE — Telephone Encounter (Signed)
Oncology Nurse Navigator Documentation  Oncology Nurse Navigator Flowsheets 11/19/2018  Navigator Location [facility]  Referral date to RadOnc/MedOnc -  Navigator Encounter Type Telephone/I received a call from patient's husband.  Patient is in the ED. I updated Dr. Worthy Flank nurse.   Telephone Incoming Call  Abnormal Finding Date -  Confirmed Diagnosis Date -  Treatment Initiated Date -  Treatment Phase -  Barriers/Navigation Needs Coordination of Care  Education -  Interventions Coordination of Care  Coordination of Care -  Education Method -  Acuity Level 2  Time Spent with Patient 15

## 2018-11-19 NOTE — Significant Event (Signed)
Shift event: RN paged because pt was having difficulty breathing, was very tight and was desatting on her normal 3L O2. Rapid at bedside. NP to bedside.  S: pt says she has been SOB since last Friday, but worse today, and acutely worse tonight. No sick contacts that she knows of. No travel. Last chemo 2 weeks ago. On chronic steroids for lupus/RA and COPD. On 2-3 L O2 at home, chronically. No chest pain.  O: Acutely ill appearing elderly female in moderate to severe respiratory distress. BP and HR are OK. O2 sat now 94% on 5L. She is alert and oriented. Card: RRR. + 1+ pedal edema. Resp: shallow breathing with noted struggling and tachypnea. Lungs are very tight. Wheezing noted. Gave Solumedrol and Albuterol inhaler at bedside.  A/P: 1. Acute SOB and increased WOB in a chronic respiratory failure pt/Acute respiratory failure now noted. Move to SDU and place on bipap immediately. Place on airborne precautions due to bipap and nebs.Pt transferred according to airborne precautions. PPE used-gown, gloves, N95, hat, eye shield.  Had neg resp panel on admission. Other testing pending. NP spoke with Page Spiro ID who recommended no COVID 19 testing at this time.  2. PNA per rounding MD-on abx for CAP. Reviewed CXR and CTA-no pneumonia noted. Maybe d/c abx tomorrow?  3. Diastolic failure-stable. Last EF in Jan was 70% with grade I dCHF.  4. Lung cancer s/p chemo. Per hemoc. Has port.  5. Lupus/RA-on steroids.  6. Emphysema #1. Continue nebs.  7. Code status-spoke with pt who says she does not want CPR or intubation. States will try bipap.  KJKG, NP [facility]  Total critical care time: start 1950, end 2100 hours. Total 70  Mins.  Critical care time was exclusive of separately billable procedures and treating other patients. Critical care was necessary to treat or prevent imminent or life-threatening deterioration. Critical care was time spent personally by me on the following activities: development of treatment  plan with patient and/or surrogate as well as nursing, discussions with consultants, evaluation of patient's response to treatment, examination of patient, obtaining history from patient or surrogate, ordering and performing treatments and interventions, ordering and review of laboratory studies, ordering and review of radiographic studies, pulse oximetry and re-evaluation of patient's condition.

## 2018-11-20 ENCOUNTER — Other Ambulatory Visit: Payer: Self-pay

## 2018-11-20 DIAGNOSIS — R000 Tachycardia, unspecified: Secondary | ICD-10-CM

## 2018-11-20 DIAGNOSIS — L932 Other local lupus erythematosus: Secondary | ICD-10-CM

## 2018-11-20 DIAGNOSIS — I4891 Unspecified atrial fibrillation: Secondary | ICD-10-CM

## 2018-11-20 DIAGNOSIS — J9601 Acute respiratory failure with hypoxia: Secondary | ICD-10-CM

## 2018-11-20 DIAGNOSIS — Z95828 Presence of other vascular implants and grafts: Secondary | ICD-10-CM

## 2018-11-20 DIAGNOSIS — I5033 Acute on chronic diastolic (congestive) heart failure: Secondary | ICD-10-CM

## 2018-11-20 DIAGNOSIS — L8990 Pressure ulcer of unspecified site, unspecified stage: Secondary | ICD-10-CM

## 2018-11-20 DIAGNOSIS — M059 Rheumatoid arthritis with rheumatoid factor, unspecified: Secondary | ICD-10-CM

## 2018-11-20 LAB — BASIC METABOLIC PANEL
Anion gap: 11 (ref 5–15)
BUN: 19 mg/dL (ref 8–23)
CO2: 28 mmol/L (ref 22–32)
Calcium: 8.5 mg/dL — ABNORMAL LOW (ref 8.9–10.3)
Chloride: 99 mmol/L (ref 98–111)
Creatinine, Ser: 0.59 mg/dL (ref 0.44–1.00)
GFR calc Af Amer: >60 mL/min (ref >60)
GLUCOSE: 130 mg/dL — AB (ref 70–99)
Potassium: 3.9 mmol/L (ref 3.5–5.1)
Sodium: 138 mmol/L (ref 135–145)

## 2018-11-20 LAB — URINALYSIS, ROUTINE W REFLEX MICROSCOPIC
Bilirubin Urine: NEGATIVE
GLUCOSE, UA: NEGATIVE mg/dL
Hgb urine dipstick: NEGATIVE
Ketones, ur: NEGATIVE mg/dL
Leukocytes,Ua: NEGATIVE
Nitrite: NEGATIVE
Protein, ur: NEGATIVE mg/dL
Specific Gravity, Urine: 1.006 (ref 1.005–1.030)
pH: 5 (ref 5.0–8.0)

## 2018-11-20 LAB — RESPIRATORY PANEL BY PCR
Adenovirus: NOT DETECTED
Bordetella pertussis: NOT DETECTED
Chlamydophila pneumoniae: NOT DETECTED
Coronavirus 229E: NOT DETECTED
Coronavirus HKU1: NOT DETECTED
Coronavirus NL63: NOT DETECTED
Coronavirus OC43: NOT DETECTED
INFLUENZA A-RVPPCR: NOT DETECTED
Influenza B: NOT DETECTED
Metapneumovirus: NOT DETECTED
Mycoplasma pneumoniae: NOT DETECTED
Parainfluenza Virus 1: NOT DETECTED
Parainfluenza Virus 2: NOT DETECTED
Parainfluenza Virus 3: NOT DETECTED
Parainfluenza Virus 4: NOT DETECTED
Respiratory Syncytial Virus: NOT DETECTED
Rhinovirus / Enterovirus: NOT DETECTED

## 2018-11-20 LAB — VITAMIN B12: VITAMIN B 12: 2524 pg/mL — AB (ref 180–914)

## 2018-11-20 LAB — CBC
HCT: 28.4 % — ABNORMAL LOW (ref 36.0–46.0)
Hemoglobin: 9.1 g/dL — ABNORMAL LOW (ref 12.0–15.0)
MCH: 30.6 pg (ref 26.0–34.0)
MCHC: 32 g/dL (ref 30.0–36.0)
MCV: 95.6 fL (ref 80.0–100.0)
Platelets: 479 10*3/uL — ABNORMAL HIGH (ref 150–400)
RBC: 2.97 MIL/uL — ABNORMAL LOW (ref 3.87–5.11)
RDW: 18.2 % — ABNORMAL HIGH (ref 11.5–15.5)
WBC: 4.9 10*3/uL (ref 4.0–10.5)
nRBC: 0.6 % — ABNORMAL HIGH (ref 0.0–0.2)

## 2018-11-20 LAB — TROPONIN I
Troponin I: <0.03 ng/mL (ref <0.03)
Troponin I: 0.03 ng/mL (ref <0.03)

## 2018-11-20 LAB — PROTIME-INR
INR: 0.9 (ref 0.8–1.2)
Prothrombin Time: 12.4 seconds (ref 11.4–15.2)

## 2018-11-20 LAB — EXPECTORATED SPUTUM ASSESSMENT W GRAM STAIN, RFLX TO RESP C

## 2018-11-20 LAB — STREP PNEUMONIAE URINARY ANTIGEN: STREP PNEUMO URINARY ANTIGEN: NEGATIVE

## 2018-11-20 LAB — MAGNESIUM: Magnesium: 2 mg/dL (ref 1.7–2.4)

## 2018-11-20 LAB — PROCALCITONIN: Procalcitonin: 49.07 ng/mL

## 2018-11-20 LAB — IRON AND TIBC
Iron: 19 ug/dL — ABNORMAL LOW (ref 28–170)
Saturation Ratios: 10 % — ABNORMAL LOW (ref 10.4–31.8)
TIBC: 189 ug/dL — ABNORMAL LOW (ref 250–450)
UIBC: 170 ug/dL

## 2018-11-20 LAB — FOLATE: Folate: 20.6 ng/mL (ref >5.9)

## 2018-11-20 LAB — FERRITIN: Ferritin: 843 ng/mL — ABNORMAL HIGH (ref 11–307)

## 2018-11-20 LAB — HIV ANTIBODY (ROUTINE TESTING W REFLEX): HIV Screen 4th Generation wRfx: NONREACTIVE

## 2018-11-20 MED ORDER — LEVALBUTEROL TARTRATE 45 MCG/ACT IN AERO
2.0000 | INHALATION_SPRAY | Freq: Four times a day (QID) | RESPIRATORY_TRACT | Status: DC
  Filled 2018-11-20: qty 15

## 2018-11-20 MED ORDER — IPRATROPIUM BROMIDE 0.02 % IN SOLN: 0.5000 mg | Freq: Four times a day (QID) | RESPIRATORY_TRACT | Status: DC

## 2018-11-20 MED ORDER — GUAIFENESIN-DM 100-10 MG/5ML PO SYRP
5.0000 mL | ORAL_SOLUTION | ORAL | Status: DC | PRN
  Administered 2018-11-20 – 2018-11-30 (×9): 5 mL via ORAL
  Filled 2018-11-20 (×9): qty 10

## 2018-11-20 MED ORDER — GUAIFENESIN ER 600 MG PO TB12
1200.0000 mg | ORAL_TABLET | Freq: Two times a day (BID) | ORAL | Status: DC
  Administered 2018-11-20 – 2018-11-28 (×17): 1200 mg via ORAL
  Filled 2018-11-20 (×17): qty 2

## 2018-11-20 MED ORDER — FLUTICASONE PROPIONATE 50 MCG/ACT NA SUSP
2.0000 | Freq: Every day | NASAL | Status: DC
  Administered 2018-11-20 – 2018-12-01 (×12): 2 via NASAL
  Filled 2018-11-20: qty 16

## 2018-11-20 MED ORDER — CHLORHEXIDINE GLUCONATE 0.12 % MT SOLN
15.0000 mL | Freq: Two times a day (BID) | OROMUCOSAL | Status: DC
  Administered 2018-11-20 – 2018-12-01 (×20): 15 mL via OROMUCOSAL
  Filled 2018-11-20 (×21): qty 15

## 2018-11-20 MED ORDER — METOPROLOL TARTRATE 5 MG/5ML IV SOLN
5.0000 mg | Freq: Once | INTRAVENOUS | Status: AC
  Administered 2018-11-20: 5 mg via INTRAVENOUS
  Filled 2018-11-20: qty 5

## 2018-11-20 MED ORDER — LIP MEDEX EX OINT
1.0000 "application " | TOPICAL_OINTMENT | CUTANEOUS | Status: DC | PRN
  Administered 2018-11-20: 1 via TOPICAL

## 2018-11-20 MED ORDER — ONDANSETRON HCL 4 MG/2ML IJ SOLN: 4.0000 mg | Freq: Four times a day (QID) | INTRAMUSCULAR | Status: DC | PRN

## 2018-11-20 MED ORDER — LEVALBUTEROL TARTRATE 45 MCG/ACT IN AERO: 2.0000 | INHALATION_SPRAY | Freq: Four times a day (QID) | RESPIRATORY_TRACT | Status: DC

## 2018-11-20 MED ORDER — DILTIAZEM LOAD VIA INFUSION
10.0000 mg | Freq: Once | INTRAVENOUS | Status: AC
  Administered 2018-11-20: 10 mg via INTRAVENOUS
  Filled 2018-11-20: qty 10

## 2018-11-20 MED ORDER — IPRATROPIUM BROMIDE 0.02 % IN SOLN: 0.5000 mg | RESPIRATORY_TRACT | Status: DC | PRN

## 2018-11-20 MED ORDER — FUROSEMIDE 10 MG/ML IJ SOLN
20.0000 mg | Freq: Two times a day (BID) | INTRAMUSCULAR | Status: DC
  Administered 2018-11-20 – 2018-11-23 (×6): 20 mg via INTRAVENOUS
  Filled 2018-11-20 (×6): qty 2

## 2018-11-20 MED ORDER — PANTOPRAZOLE SODIUM 40 MG PO TBEC
40.0000 mg | DELAYED_RELEASE_TABLET | Freq: Every day | ORAL | Status: DC
  Administered 2018-11-20 – 2018-12-01 (×11): 40 mg via ORAL
  Filled 2018-11-20 (×11): qty 1

## 2018-11-20 MED ORDER — LEVALBUTEROL HCL 0.63 MG/3ML IN NEBU: 0.6300 mg | INHALATION_SOLUTION | Freq: Four times a day (QID) | RESPIRATORY_TRACT | Status: DC

## 2018-11-20 MED ORDER — METOPROLOL TARTRATE 5 MG/5ML IV SOLN
2.5000 mg | INTRAVENOUS | Status: DC | PRN
  Administered 2018-11-20 – 2018-11-22 (×3): 2.5 mg via INTRAVENOUS
  Filled 2018-11-20 (×3): qty 5

## 2018-11-20 MED ORDER — APIXABAN 2.5 MG PO TABS
2.5000 mg | ORAL_TABLET | Freq: Two times a day (BID) | ORAL | Status: DC
  Administered 2018-11-20 – 2018-12-01 (×23): 2.5 mg via ORAL
  Filled 2018-11-20 (×23): qty 1

## 2018-11-20 MED ORDER — LEVALBUTEROL HCL 0.63 MG/3ML IN NEBU
0.6300 mg | INHALATION_SOLUTION | RESPIRATORY_TRACT | Status: DC | PRN
  Administered 2018-11-21: 0.63 mg via RESPIRATORY_TRACT
  Filled 2018-11-20: qty 3

## 2018-11-20 MED ORDER — METHYLPREDNISOLONE SODIUM SUCC 125 MG IJ SOLR
80.0000 mg | Freq: Two times a day (BID) | INTRAMUSCULAR | Status: DC
  Administered 2018-11-20 – 2018-11-22 (×4): 80 mg via INTRAVENOUS
  Filled 2018-11-20 (×4): qty 2

## 2018-11-20 MED ORDER — LORATADINE 10 MG PO TABS
10.0000 mg | ORAL_TABLET | Freq: Every day | ORAL | Status: DC
  Administered 2018-11-20 – 2018-12-01 (×12): 10 mg via ORAL
  Filled 2018-11-20 (×12): qty 1

## 2018-11-20 MED ORDER — DILTIAZEM HCL 100 MG IV SOLR
INTRAVENOUS | Status: AC
  Filled 2018-11-20: qty 100

## 2018-11-20 MED ORDER — LEVALBUTEROL TARTRATE 45 MCG/ACT IN AERO
2.0000 | INHALATION_SPRAY | Freq: Four times a day (QID) | RESPIRATORY_TRACT | Status: DC
  Administered 2018-11-20 – 2018-11-21 (×7): 2 via RESPIRATORY_TRACT

## 2018-11-20 MED ORDER — ACETAMINOPHEN 325 MG PO TABS: 650.0000 mg | ORAL_TABLET | ORAL | Status: DC | PRN

## 2018-11-20 MED ORDER — ADULT MULTIVITAMIN W/MINERALS CH
1.0000 | ORAL_TABLET | Freq: Every day | ORAL | Status: DC
  Administered 2018-11-20 – 2018-12-01 (×12): 1 via ORAL
  Filled 2018-11-20 (×12): qty 1

## 2018-11-20 MED ORDER — METHYLPREDNISOLONE SODIUM SUCC 125 MG IJ SOLR
80.0000 mg | Freq: Three times a day (TID) | INTRAMUSCULAR | Status: DC
  Administered 2018-11-20: 80 mg via INTRAVENOUS
  Filled 2018-11-20: qty 2

## 2018-11-20 MED ORDER — BUDESONIDE 0.5 MG/2ML IN SUSP
0.5000 mg | Freq: Two times a day (BID) | RESPIRATORY_TRACT | Status: DC
  Administered 2018-11-20 – 2018-11-22 (×4): 0.5 mg via RESPIRATORY_TRACT
  Filled 2018-11-20 (×5): qty 2

## 2018-11-20 MED ORDER — DILTIAZEM HCL 100 MG IV SOLR
20.0000 mg/h | INTRAVENOUS | Status: DC
  Administered 2018-11-20: 20 mg/h via INTRAVENOUS
  Administered 2018-11-20: 5 mg/h via INTRAVENOUS
  Administered 2018-11-20 – 2018-11-21 (×4): 20 mg/h via INTRAVENOUS
  Filled 2018-11-20 (×6): qty 100

## 2018-11-20 MED ORDER — LIP MEDEX EX OINT
TOPICAL_OINTMENT | CUTANEOUS | Status: AC
  Filled 2018-11-20: qty 7

## 2018-11-20 MED ORDER — METOPROLOL TARTRATE 25 MG PO TABS: 25.0000 mg | ORAL_TABLET | Freq: Two times a day (BID) | ORAL | Status: DC

## 2018-11-20 MED ORDER — SODIUM CHLORIDE 0.9% FLUSH
10.0000 mL | Freq: Two times a day (BID) | INTRAVENOUS | Status: DC
  Administered 2018-11-21 – 2018-12-01 (×13): 10 mL

## 2018-11-20 MED ORDER — METOPROLOL TARTRATE 25 MG PO TABS
50.0000 mg | ORAL_TABLET | Freq: Two times a day (BID) | ORAL | Status: DC
  Administered 2018-11-20: 50 mg via ORAL
  Filled 2018-11-20 (×2): qty 2

## 2018-11-20 MED ORDER — IPRATROPIUM BROMIDE HFA 17 MCG/ACT IN AERS: 2.0000 | INHALATION_SPRAY | Freq: Four times a day (QID) | RESPIRATORY_TRACT | Status: DC

## 2018-11-20 MED ORDER — IPRATROPIUM BROMIDE HFA 17 MCG/ACT IN AERS
2.0000 | INHALATION_SPRAY | RESPIRATORY_TRACT | Status: DC
  Filled 2018-11-20: qty 12.9

## 2018-11-20 MED ORDER — IPRATROPIUM BROMIDE HFA 17 MCG/ACT IN AERS
2.0000 | INHALATION_SPRAY | Freq: Four times a day (QID) | RESPIRATORY_TRACT | Status: DC
  Administered 2018-11-20 – 2018-11-21 (×7): 2 via RESPIRATORY_TRACT

## 2018-11-20 MED ORDER — ARFORMOTEROL TARTRATE 15 MCG/2ML IN NEBU
15.0000 ug | INHALATION_SOLUTION | Freq: Two times a day (BID) | RESPIRATORY_TRACT | Status: DC
  Administered 2018-11-20 – 2018-11-22 (×4): 15 ug via RESPIRATORY_TRACT
  Filled 2018-11-20 (×5): qty 2

## 2018-11-20 MED ORDER — SODIUM CHLORIDE 0.9% FLUSH
10.0000 mL | INTRAVENOUS | Status: DC | PRN
  Administered 2018-11-20 – 2018-11-27 (×4): 10 mL
  Filled 2018-11-20 (×4): qty 40

## 2018-11-20 MED ORDER — ORAL CARE MOUTH RINSE
15.0000 mL | Freq: Two times a day (BID) | OROMUCOSAL | Status: DC
  Administered 2018-11-20 – 2018-11-29 (×10): 15 mL via OROMUCOSAL

## 2018-11-20 NOTE — Progress Notes (Signed)
Pt has no hx travel Pt has no hx sick contacts Has been afebrile Wbc normal Will stop isolation

## 2018-11-20 NOTE — Consult Note (Addendum)
CARDIOLOGY CONSULT NOTE       Patient ID: Donna Mcneil MRN: [id number] DOB/AGE: [birth year] 78 y.o.  Admit date: 11/19/2018 Referring Physician: Grandville Silos Primary Physician: [name], PA Primary Cardiologist: None Reason for Consultation: Afib  Principal Problem:   Acute respiratory failure with hypoxia ([place]) Active Problems:   Seropositive rheumatoid arthritis ([place])   Hyperlipidemia   Hypertension   Cutaneous lupus erythematosus   COPD (chronic obstructive pulmonary disease) (HCC)   Chronic respiratory failure with hypoxia (HCC)   Hyponatremia   Malignant pleural effusion   Adenocarcinoma of left lung, stage 4 (HCC)   Port-A-Cath in place   Pressure injury of skin   Tachycardia   Acute on chronic diastolic CHF (congestive heart failure) (HCC)   Atrial fibrillation with RVR (HCC)   HPI:  78 y.o. with stage iV non small cell lung cancer getting chemoRx. She has been on oxygen 3L since January Admitted With URI cough low grade fever and sputum. Last dose chemo 10/09/18 Smoking up unitl diagnosis of cancer in January Pre Chemo echo 09/02/18 with EF 65-70% no valve disease Developed rapid atrial fib after admission has been Rx with iv cardizem but rates still high in 140 bpm range. She complains of smothering and dyspnea despite sats over 90% She is anxious. No chest pain or palpitations. CHADVASC score 3 for age and HTN  ROS All other systems reviewed and negative except as noted above  Past Medical History:  Diagnosis Date   COPD exacerbation ([address]) 09/01/2018   Diverticulitis    Hypercholesteremia    Hypertension    Lupus ([place])    Osteoporosis    Tobacco use     Family History  Problem Relation Age of Onset   Alcohol abuse Mother    Cirrhosis Mother    Heart disease Father    Hypertension Father    Gout Father    Ulcers Father    Hyperlipidemia Father    Alcohol abuse Brother    Heart disease Brother        MI at age 11   OCD Son     Prostatitis Paternal Grandfather    Pneumonia Paternal Grandfather    Cancer Son     Social History   Socioeconomic History   Marital status: Divorced    Spouse name: Not on file   Number of children: 2   Years of education: Not on file   Highest education level: High school graduate  Occupational History   Occupation: retired  Scientist, 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 all   Food insecurity:    Worry: Never true    Inability: Never true   Transportation needs:    Medical: No    Non-medical: No  Tobacco Use   Smoking status: Former Smoker    Packs/day: 0.50    Types: Cigarettes    Last attempt to quit: 09/01/2018    Years since quitting: 0.2   Smokeless tobacco: Never Used   Tobacco comment: started smoking at age 74  Substance and Sexual Activity   Alcohol use: No    Alcohol/week: 0.0 standard drinks   Drug use: No   Sexual activity: Not Currently  Lifestyle   Physical activity:    Days per week: Not on file    Minutes per session: Not on file   Stress: To some extent  Relationships   Social connections:    Talks on phone: Not on file    Gets together: Not  on file    Attends religious service: Not on file    Active member of club or organization: Not on file    Attends meetings of clubs or organizations: Not on file    Relationship status: Not on file   Intimate partner violence:    Fear of current or ex partner: Not on file    Emotionally abused: Not on file    Physically abused: Not on file    Forced sexual activity: Not on file  Other Topics Concern   Not on file  Social History Narrative   Not on file    Past Surgical History:  Procedure Laterality Date   ABDOMINAL HYSTERECTOMY  12/1969   [place]  10/16/2018   [place]  2013   secondary to diverticulitis   spit seed removal      TONSILLECTOMY  1945      ALPRAZolam  1 mg Oral Q2000   amLODipine  10 mg Oral Daily   apixaban  2.5 mg Oral BID   arformoterol  15 mcg Nebulization BID   aspirin EC  81 mg Oral Daily   atorvastatin  40 mg Oral Q2000   azithromycin  500 mg Oral Q24H   budesonide (PULMICORT) nebulizer solution  0.5 mg Nebulization BID   chlorhexidine  15 mL Mouth Rinse BID   Chlorhexidine Gluconate Cloth  6 each Topical Daily   feeding supplement (ENSURE ENLIVE)  237 mL Oral BID BM   fluticasone  2 spray Each Nare Daily   folic acid  1 mg Oral Daily   furosemide  20 mg Intravenous Q12H   gabapentin  100 mg Oral TID   guaiFENesin  1,200 mg Oral BID   hydrochlorothiazide  50 mg Oral Daily   ipratropium  2 puff Inhalation Q6H   levalbuterol  2 puff Inhalation Q6H   lip balm       loratadine  10 mg Oral Daily   mouth rinse  15 mL Mouth Rinse q12n4p   methylPREDNISolone (SOLU-MEDROL) injection  80 mg Intravenous Q12H   metoprolol tartrate  25 mg Oral BID   multivitamin with minerals  1 tablet Oral Daily   pantoprazole  40 mg Oral Q0600   potassium chloride  40 mEq Oral Once   sodium chloride flush  10-40 mL Intracatheter Q12H   umeclidinium-vilanterol  1 puff Inhalation Daily    sodium chloride 10 mL/hr at 11/20/18 0443   cefTRIAXone (ROCEPHIN)  IV Stopped (11/19/18 2226)   diltiazem (CARDIZEM) infusion 20 mg/hr (11/20/18 1250)    Physical Exam: Blood pressure 104/67, pulse (!) 169, temperature 97.7 F (36.5 C), temperature source Oral, resp. rate (!) 27, height 5\' 2"  (1.575 m), weight 55.6 kg, SpO2 95 %.    Anxious Chronically ill white female  HEENT: normal Neck supple with no adenopathy JVP normal no bruits no thyromegaly Lungs diffuse rhonchi  Heart:  S1/S2 no murmur, no rub, gallop or click PMI normal Abdomen: benighn, BS positve, no tenderness, no AAA no bruit.  No HSM or HJR Distal pulses intact with no bruits Plus one bilateral  edema Neuro  non-focal Skin warm and dry No muscular weakness  Labs:   Lab Results  Component Value Date   WBC 4.9 11/20/2018   HGB 9.1 (L) 11/20/2018   HCT 28.4 (L) 11/20/2018   MCV 95.6 11/20/2018   PLT 479 (H)  11/20/2018    Recent Labs  Lab 11/19/18 1311 11/20/18 0508  NA 136 138  K 3.6 3.9  CL 98 99  CO2 27 28  BUN 19 19  CREATININE 0.57 0.59  CALCIUM 9.3 8.5*  PROT 7.4  --   BILITOT 0.5  --   ALKPHOS 94  --   ALT 20  --   AST 20  --   GLUCOSE 121* 130*   Lab Results  Component Value Date   CKMB 0.6 04/20/2012   TROPONINI <0.03 11/20/2018    Lab Results  Component Value Date   CHOL 124 05/14/2018   CHOL 144 01/09/2018   CHOL 143 10/10/2017   Lab Results  Component Value Date   HDL 57 05/14/2018   HDL 43 01/09/2018   HDL 51 10/10/2017   Lab Results  Component Value Date   LDLCALC 35 05/14/2018   LDLCALC 53 01/09/2018   LDLCALC 59 10/10/2017   Lab Results  Component Value Date   TRIG 158 (H) 05/14/2018   TRIG 239 (H) 01/09/2018   TRIG 164 (H) 10/10/2017   Lab Results  Component Value Date   CHOLHDL 2.2 05/14/2018   CHOLHDL 3.3 01/09/2018   CHOLHDL 2.8 10/10/2017   No results found for: LDLDIRECT    Radiology: Dg Chest 2 View  Result Date: 11/19/2018 CLINICAL DATA:  Increasing cough, congestion and shortness of breath since 11/15/2017. History of lung cancer. EXAM: CHEST - 2 VIEW COMPARISON:  PA and lateral chest 10/30/2018 and 09/12/2018. PET CT scan 09/20/2018. FINDINGS: The lungs are markedly emphysematous. Small left pleural effusion has slightly increased since the prior exam. There is some left basilar atelectasis. Left upper lobe pulmonary nodule seen on prior CT is not well demonstrated on this exam. No consolidative process pneumothorax. There is cardiomegaly. Aortic atherosclerosis is noted. IMPRESSION: No change in a small left pleural effusion. Subsegmental atelectasis left lung base has slightly increased since the most recent exam. Emphysema.  Known left upper lobe pulmonary nodule is not visible on this exam. Cardiomegaly. Atherosclerosis. Electronically Signed   By: Inge Rise M.D.   On: 11/19/2018 12:41   Dg Chest 2 View  Result Date: 10/30/2018 CLINICAL DATA:  Left pleural effusion. EXAM: CHEST - 2 VIEW COMPARISON:  10/01/2018 and 09/12/2018 FINDINGS: A right jugular Port-A-Cath has been placed and terminates over the mid to lower SVC. The cardiac silhouette is borderline enlarged. Aortic atherosclerosis is noted. The lungs are hyperinflated with diffusely increased interstitial markings bilaterally. There is improved left lung aeration with resolved basilar consolidation compared to 10/01/2018 and with the overall appearance of the lungs similar to the older study from 09/12/2018. Left apical pleural thickening is again noted. There is also pleural thickening and/or a persistent small pleural effusion in the left lung base. No pneumothorax is identified. No acute osseous abnormality is seen. IMPRESSION: 1. Improved aeration of the left lung base. Persistent small left pleural effusion and/or pleural thickening. 2. COPD/chronic bronchitic changes. Electronically Signed   By: Logan Bores M.D.   On: 10/30/2018 17:06   Ct Angio Chest Pe W And/or Wo Contrast  Result Date: 11/19/2018 CLINICAL DATA:  Shortness of breath, cough, lung cancer EXAM: CT ANGIOGRAPHY CHEST WITH CONTRAST TECHNIQUE: Multidetector CT imaging of the chest was performed using the standard protocol during bolus administration of intravenous contrast. Multiplanar CT image reconstructions and MIPs were obtained to evaluate the vascular anatomy. CONTRAST:  131mL OMNIPAQUE IOHEXOL 350 MG/ML SOLN COMPARISON:  09/01/2018 FINDINGS: Cardiovascular: Satisfactory opacification  of the pulmonary arteries to the segmental level. No evidence of pulmonary embolism. Normal heart size. Scattered coronary artery calcifications. Aortic atherosclerosis. No pericardial effusion. Right chest  port catheter. Mediastinum/Nodes: Slight interval decrease in size of left hilar and subcarinal lymph nodes. There are new enlarged pretracheal and AP window lymph nodes (series 4, image 42). Thyroid gland, trachea, and esophagus demonstrate no significant findings. Lungs/Pleura: Small left pleural effusion with thickening and nodularity. Interval decrease in size of primary left upper lobe lung malignancy measuring 1.4 x 1.0 cm, previously 1.9 x 1.6 cm when measured similarly (series 4, image 52). There is extensive bilateral peripheral nodular and heterogeneous airspace opacity (series 4, image 67). Underlying emphysema Upper Abdomen: No acute abnormality. Musculoskeletal: No chest wall abnormality. No acute or significant osseous findings. Review of the MIP images confirms the above findings. IMPRESSION: 1.  Negative examination for pulmonary embolism. 2. Redemonstrated stigmata of advanced left lung malignancy. Primary left upper lobe nodule appears decreased in size compared to prior examination with decreased bulk of left hilar and subcarinal lymphadenopathy. There is redemonstrated pleural thickening and nodularity with a reduced volume of pleural effusion. Findings are generally consistent with treatment response. 3. There is new, extensive bilateral peripheral nodular and heterogeneous airspace opacity and new enlarged paratracheal and AP window lymph nodes, this constellation of findings likely reflecting infection and/or edema. 4.  Underlying emphysema. Electronically Signed   By: Eddie Candle M.D.   On: 11/19/2018 15:31    IYM:EBRAX afib non specific ST changes admitting SR with PVC   ASSESSMENT AND PLAN:   PAF:  In setting of stage 4 lung cancer , hypermetabolic and anemia. Rate control no likely possible Her HR will unlikely be regulated below 100 bpm especially with her anxiety. Have written for rebolus 10 mg and increase cardizem drip to 20 mg/hour Her BP is ok Will increase oral beta  blocker as she is not actively bronchospastic. No need to repeat echo done fairly recently. Given metastatic lung cancer and anemia would be cautious with anticoagulation  but primary service appears to have her on eliquis 2.5 mg bid Will d/c norvasc to make room for iv cardizem and oral beta blocker    Signed: Jenkins Rouge 11/20/2018, 1:35 PM

## 2018-11-20 NOTE — Progress Notes (Signed)
Per health system leadership, therapy services are being held at this time until patient tests negative for COVID-19. Will follow.   Blondell Reveal Kistler PT 11/20/2018  Acute Rehabilitation Services Pager [phone] Office [phone]

## 2018-11-20 NOTE — Progress Notes (Signed)
Patient requested me to notify her ex-husband, Hilario Quarry, at [phone] to update him on her condition and her transfer to ICU. She stated the specific times to call him which were 445 and 545. Unable to reach him; therefore, I left message for him to call me back at [phone]. Will continue to try to reach patient's ex-husband.

## 2018-11-20 NOTE — Progress Notes (Signed)
Upon shift change RN to greet pt and noticed pt SOB,  tachypnic with labored breathing, and states "I cannot catch my breath." Pt maintaining oxygen saturation at 93% on 5L. CMT reported pt had multiple runs of v-tach. Vital signs obtained. Rapid response nurse and on call provider Baltazar Najjar, NP called to bedside. IV lasix and solumedrol given to patient per NP order. Pt to be transfered to ICU for bipap and airborne precautions. Pt transported with proper precautions taken. This nurse helped Kir[place] RN with admission.

## 2018-11-20 NOTE — Progress Notes (Addendum)
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  Patient with a prior tobacco history and quit in January 2020 after her diagnosis of lung cancer.  Patient also presented with increased lower extremity edema with associated nausea.  Patient seen in the ED white count of 6.9, hemoglobin of 13.1 platelet count of 394 with a BNP of 540.4.  Troponin less than 0.03.  CT angiogram chest was negative for PE however noticed advanced left lung malignancy, pleural effusion and new extensive bilateral peripheral nodular and heterogeneous airspace opacity and new enlarged paratracheal lymph nodes consistent with infection versus edema.  Overnight on the day of admission patient noted to go into respiratory decompensation was transferred to the ICU and placed on the BiPAP.  ID, Dr. Johnnye Sima was curbside by nurse practitioner Forrest Moron the evening of 11/19/2018 and it was felt testing for COVID 19 was not warranted at this time.   Assessment & Plan:   Principal Problem:   Acute respiratory failure with hypoxia (HCC) Active Problems:   Seropositive rheumatoid arthritis (HCC)   Hyperlipidemia   Hypertension   Cutaneous lupus erythematosus   COPD (chronic obstructive pulmonary disease) (HCC)   Chronic respiratory failure with hypoxia (HCC)   Hyponatremia   Malignant pleural effusion   Adenocarcinoma of left lung, stage 4 (HCC)   Port-A-Cath in place   Pressure injury of skin   Tachycardia   Acute on chronic diastolic CHF (congestive heart failure) (HCC)  1 acute on chronic respiratory failure with hypoxia secondary to acute COPD  exacerbation and acute on chronic diastolic heart failure Overnight patient noted to have some respiratory decompensation and transferred to the stepdown unit where she was placed on the BiPAP.  ID was curb sided and it was felt that patient did not need testing for COVID 19 at that time.  Patient improving clinically with minimal expiratory wheezing.  Blood cultures pending.  Sputum Gram stain and culture pending.  Influenza PCR negative.  Respiratory viral panel pending.  Procalcitonin was elevated but is trending back down.  Urine strep pneumococcus antigen is negative.  Urine Legionella antigen pending.  Patient with recent 2D echo in January 2020 with a EF of 65 to 70% with grade 1 diastolic dysfunction with no wall motion abnormalities.  Urine output of 350 cc recorded since admission.  Continue Xopenex MDIs.  Add Atrovent MDIs.  Place on Brovana and Pulmicort.  Continue azithromycin and Rocephin.  Place on Flonase, Mucinex, PPI, Claritin.  IV Solu-Medrol 80 mg was ordered early on this morning and will decrease to IV Solu-Medrol 60 mg IV every 12 hours and taper.  Continue Lasix 20 mg IV daily for 2 more days and transition back to oral Lasix.  Forrest Moron, NP overnight discussed case with Dr. Ileene Patrick of ID who felt testing for COVID 19 not warranted at this time.  Patient lives alone, only goes from cancer center and back home.  Patient with no recent sick contacts.  Patient with no recent travel.  Strict I's and O's.  Daily weights.  Discontinue airborne and contact precautions and place on droplet precautions pending respiratory viral panel.  Follow.  2.  Sinus tachycardia Patient noted to have a sinus tachycardia with occasional PVCs.  Patient started on low-dose beta-blocker.  TSH free T4 within normal limits.  Change albuterol nebs to Xopenex MDIs.  3.  Anemia Likely secondary to chemotherapy.  Patient with no overt bleeding.  Check an anemia panel.  Follow H&H.  Transfusion threshold hemoglobin  less than 7.  4.  Acute on chronic bilateral lower extremity edema Patient reporting slightly worsened over lower extremity edema over the past few weeks.  Patient recently underwent bilateral duplex ultrasound on 09/03/2018 which was negative for DVT.  We will not repeat Dopplers at this time.  Lower extremity edema improving.  Continue IV Lasix.  See problem #1.  5.  Stage IV non-small cell lung cancer Patient follows with oncology Dr. Lorna Few.  Port-A-Cath in place.  Patient received first dose of chemotherapy October 09, 2018 with carboplatin, Alimta, and have a statin.  Oncology has been informed of patient's admission via epic.  Outpatient follow-up with oncology.  6.  Hypokalemia Repleted.  7.  SLE/rheumatoid arthritis On methotrexate 15 mg every Monday.  Hold methotrexate for now.  Continue folic acid 1 mg daily.  8.  History of tobacco use disorder Patient recently quit smoking in January 2020 after being diagnosed with lung cancer.  Patient has been smoking since age 73.  Patient congratulated on her efforts on quitting tobacco use.  9.  Nonpressure wound right buttocks present on admission Continue current wound care.  10.  Hypertension Continue Norvasc, HCTZ and Lasix.    11 hyperlipidemia Continue statin.   DVT prophylaxis: Lovenox Code Status: DNR Family Communication: Updated patient.  No family at bedside. Disposition Plan: Likely back to prior living situation prior to admission when clinically improved.  Likely transfer to telemetry if remains stable in the next 24 hours.   Consultants:   Curbside ID: Dr. Johnnye Sima 11/19/2018--per nurse practitioner Forrest Moron.  Procedures:   CT chest 11/19/2018  Chest x-ray 11/19/2018  Antimicrobials:  IV Rocephin 11/19/2018  Oral azithromycin 11/19/2018   Subjective: Patient sitting up in bed.  Feels shortness of breath has improved since admission.  Feels wheezing is improving.  Denies any chest pain.  States  lower extremity edema has improved significantly since admission.  Cough.  Events overnight noted.  Objective: Vitals:   11/20/18 0700 11/20/18 0800 11/20/18 0834 11/20/18 0900  BP: (!) 147/63 (!) 141/36 (!) 159/80 122/85  Pulse: (!) 113 (!) 54  (!) 111  Resp: 20 20  14   Temp:   98.7 F (37.1 C)   TempSrc:   Axillary   SpO2: 100% 100%  99%  Weight:      Height:        Intake/Output Summary (Last 24 hours) at 11/20/2018 0953 Last data filed at 11/20/2018 0900 Gross per 24 hour  Intake 160.37 ml  Output 525 ml  Net -364.63 ml   Filed Weights   11/19/18 1134 11/19/18 2115 11/20/18 0312  Weight: 58.1 kg 56.8 kg 55.6 kg    Examination:  General exam: Appears calm and comfortable  Respiratory system: Minimal expiratory wheezing.  Fair air movement.  No crackles.  Speaking in full sentences.  Normal respiratory effort.   Cardiovascular system: Tachycardia.  No JVD.  No murmurs rubs or gallops.  Trace bilateral lower extremity edema.  Gastrointestinal system: Abdomen is nondistended, soft and nontender. No organomegaly or masses felt. Normal bowel sounds heard. Central nervous system: Alert and oriented. No focal neurological deficits. Extremities:  Symmetric 5 x 5 power. Skin: No rashes, lesions or ulcers Psychiatry: Judgement and insight appear normal. Mood & affect appropriate.     Data Reviewed: I have personally reviewed following labs and imaging studies  CBC: Recent Labs  Lab 11/14/18 1134 11/19/18 1311 11/20/18 0508  WBC 10.2 6.9 4.9  NEUTROABS 7.3 4.7  --   HGB 10.0* 13.1 9.1*  HCT 29.1* 39.7 28.4*  MCV 89.8 94.3 95.6  PLT 159 394 903*   Basic Metabolic Panel: Recent Labs  Lab 11/14/18 1134 11/19/18 1311 11/20/18 0508  NA 134* 136 138  K 3.0* 3.6 3.9  CL 95* 98 99  CO2 28 27 28   GLUCOSE 111* 121* 130*  BUN 11 19 19   CREATININE 0.63 0.57 0.59  CALCIUM 8.8* 9.3 8.5*  MG  --   --  2.0   GFR: Estimated Creatinine Clearance: 45.8 mL/min (by C-G  formula based on SCr of 0.59 mg/dL). Liver Function Tests: Recent Labs  Lab 11/14/18 1134 11/19/18 1311  AST 22 20  ALT 28 20  ALKPHOS 98 94  BILITOT 0.5 0.5  PROT 7.0 7.4  ALBUMIN 2.3* 2.4*   No results for input(s): LIPASE, AMYLASE in the last 168 hours. No results for input(s): AMMONIA in the last 168 hours. Coagulation Profile: No results for input(s): INR, PROTIME in the last 168 hours. Cardiac Enzymes: Recent Labs  Lab 11/19/18 1317  TROPONINI <0.03   BNP (last 3 results) No results for input(s): PROBNP in the last 8760 hours. HbA1C: No results for input(s): HGBA1C in the last 72 hours. CBG: No results for input(s): GLUCAP in the last 168 hours. Lipid Profile: No results for input(s): CHOL, HDL, LDLCALC, TRIG, CHOLHDL, LDLDIRECT in the last 72 hours. Thyroid Function Tests: Recent Labs    11/19/18 1252  TSH 0.524  FREET4 1.33   Anemia Panel: Recent Labs    11/19/18 2113  VITAMINB12 2,524*  FOLATE 20.6  FERRITIN 843*  TIBC 189*  IRON 19*   Sepsis Labs: Recent Labs  Lab 11/19/18 1309 11/19/18 2113 11/20/18 0508  PROCALCITON  --  59.26 49.07  LATICACIDVEN 1.3  --   --     Recent Results (from the past 240 hour(s))  Respiratory Panel by PCR     Status: None   Collection Time: 11/19/18  9:13 PM  Result Value Ref Range Status   Adenovirus NOT DETECTED NOT DETECTED Final   Coronavirus 229E NOT DETECTED NOT DETECTED Final    Comment: (NOTE) The Coronavirus on the Respiratory Panel, DOES NOT test for the novel  Coronavirus (2019 nCoV)    Coronavirus HKU1 NOT DETECTED NOT DETECTED Final   Coronavirus NL63 NOT DETECTED NOT DETECTED Final   Coronavirus OC43 NOT DETECTED NOT DETECTED Final   Metapneumovirus NOT DETECTED NOT DETECTED Final   Rhinovirus / Enterovirus NOT DETECTED NOT DETECTED Final   Influenza A NOT DETECTED NOT DETECTED Final   Influenza B NOT DETECTED NOT DETECTED Final   Parainfluenza Virus 1 NOT DETECTED NOT DETECTED Final    Parainfluenza Virus 2 NOT DETECTED NOT DETECTED Final   Parainfluenza Virus 3 NOT DETECTED NOT DETECTED Final   Parainfluenza Virus 4 NOT DETECTED NOT DETECTED Final   Respiratory Syncytial Virus NOT DETECTED NOT DETECTED Final   Bordetella pertussis NOT DETECTED NOT DETECTED Final   Chlamydophila pneumoniae NOT DETECTED NOT DETECTED Final   Mycoplasma pneumoniae NOT DETECTED NOT DETECTED Final    Comment: Performed at [facility], [place]. [address]  [place], [place] [address]  MRSA PCR Screening     Status: None   Collection Time: 11/19/18  9:13 PM  Result Value Ref Range Status   MRSA by PCR NEGATIVE NEGATIVE Final    Comment:        The GeneXpert MRSA Assay (FDA approved for NASAL specimens only), is one component of a comprehensive MRSA colonization surveillance program. It is not intended to diagnose MRSA infection nor to guide or monitor treatment for MRSA infections. Performed at [facility], [place] [address] [place], [place] [address]          Radiology Studies: Dg Chest 2 View  Result Date: 11/19/2018 CLINICAL DATA:  Increasing cough, congestion and shortness of breath since 11/15/2017. History of lung cancer. EXAM: CHEST - 2 VIEW COMPARISON:  PA and lateral chest 10/30/2018 and 09/12/2018. PET CT scan 09/20/2018. FINDINGS: The lungs are markedly emphysematous. Small left pleural effusion has slightly increased since the prior exam. There is some left basilar atelectasis. Left upper lobe pulmonary nodule seen on prior CT is not well demonstrated on this exam. No consolidative process pneumothorax. There is cardiomegaly. Aortic atherosclerosis is noted. IMPRESSION: No change in a small left pleural effusion. Subsegmental atelectasis left lung base has slightly increased since the most recent exam. Emphysema. Known left upper lobe pulmonary nodule is not visible on this exam. Cardiomegaly. Atherosclerosis. Electronically Signed   By: Inge Rise  M.D.   On: 11/19/2018 12:41   Ct Angio Chest Pe W And/or Wo Contrast  Result Date: 11/19/2018 CLINICAL DATA:  Shortness of breath, cough, lung cancer EXAM: CT ANGIOGRAPHY CHEST WITH CONTRAST TECHNIQUE: Multidetector CT imaging of the chest was performed using the standard protocol during bolus administration of intravenous contrast. Multiplanar CT image reconstructions and MIPs were obtained to evaluate the vascular anatomy. CONTRAST:  110mL OMNIPAQUE IOHEXOL 350 MG/ML SOLN COMPARISON:  09/01/2018 FINDINGS: Cardiovascular: Satisfactory opacification of the pulmonary arteries to the segmental level. No evidence of pulmonary embolism. Normal heart size. Scattered coronary artery calcifications. Aortic atherosclerosis. No pericardial effusion. Right chest port catheter. Mediastinum/Nodes: Slight interval decrease in size of left hilar and subcarinal lymph nodes. There are new enlarged pretracheal and AP window lymph nodes (series 4, image 42). Thyroid gland, trachea, and esophagus demonstrate no significant findings. Lungs/Pleura: Small left pleural effusion with thickening and nodularity. Interval decrease in size of primary left upper lobe lung malignancy measuring 1.4 x 1.0 cm, previously 1.9 x 1.6 cm when measured similarly (series 4, image 52). There is extensive bilateral peripheral nodular and heterogeneous airspace opacity (series 4, image 67). Underlying emphysema Upper Abdomen: No acute abnormality. Musculoskeletal: No chest wall abnormality. No acute or significant osseous findings. Review of the MIP images confirms the above findings. IMPRESSION: 1.  Negative examination for pulmonary embolism. 2. Redemonstrated stigmata of advanced left lung malignancy. Primary left upper lobe nodule appears decreased in size compared to prior examination with decreased bulk of left hilar and subcarinal lymphadenopathy. There is redemonstrated pleural thickening and nodularity with a reduced volume of pleural effusion.  Findings are generally consistent with treatment response. 3. There is new, extensive bilateral peripheral nodular and heterogeneous airspace opacity and new enlarged paratracheal and AP window lymph nodes, this constellation of findings likely reflecting infection and/or edema. 4.  Underlying emphysema. Electronically Signed   By: Eddie Candle M.D.   On: 11/19/2018 15:31        Scheduled Meds:  ALPRAZolam  1 mg Oral Q2000   amLODipine  10 mg  Oral Daily   arformoterol  15 mcg Nebulization BID   aspirin EC  81 mg Oral Daily   atorvastatin  40 mg Oral Q2000   azithromycin  500 mg Oral Q24H   budesonide (PULMICORT) nebulizer solution  0.5 mg Nebulization BID   chlorhexidine  15 mL Mouth Rinse BID   Chlorhexidine Gluconate Cloth  6 each Topical Daily   enoxaparin (LOVENOX) injection  40 mg Subcutaneous Q24H   feeding supplement (ENSURE ENLIVE)  237 mL Oral BID BM   fluticasone  2 spray Each Nare Daily   folic acid  1 mg Oral Daily   furosemide  20 mg Intravenous Daily   gabapentin  100 mg Oral TID   guaiFENesin  1,200 mg Oral BID   hydrochlorothiazide  50 mg Oral Daily   ipratropium  0.5 mg Nebulization Q6H   levalbuterol  0.63 mg Nebulization Q6H   lip balm       loratadine  10 mg Oral Daily   mouth rinse  15 mL Mouth Rinse q12n4p   methylPREDNISolone (SOLU-MEDROL) injection  80 mg Intravenous TID   metoprolol tartrate  12.5 mg Oral BID   pantoprazole  40 mg Oral Q0600   potassium chloride  40 mEq Oral Once   sodium chloride flush  10-40 mL Intracatheter Q12H   umeclidinium-vilanterol  1 puff Inhalation Daily   Continuous Infusions:  sodium chloride 10 mL/hr at 11/20/18 0443   cefTRIAXone (ROCEPHIN)  IV Stopped (11/19/18 2226)     LOS: 1 day    Time spent: 40 minutes    Irine Seal, MD [facility]  If 7PM-7AM, please contact night-coverage [email or web address] 11/20/2018, 9:53 AM

## 2018-11-20 NOTE — Progress Notes (Signed)
While collecting urine, pt.s heart rate went to 170s sustained. MD paged. Came to beside. EKG afib RVR. New orders given. Patient does not complain of chest pain or SOB.

## 2018-11-20 NOTE — Progress Notes (Signed)
Patient states that she is unable to urinate using a purewick. Patient stated that she is agreeable to a foley catheter. Paged Baltazar Najjar. New order for foley. Placed 14 french foley with [employer]) assistance. 350 ml urine output after insertion. Will continue to monitor.

## 2018-11-20 NOTE — Progress Notes (Signed)
Initial Nutrition Assessment  RD working remotely.   DOCUMENTATION CODES:   Not applicable  INTERVENTION:  - Continue Ensure Enlive BID, each supplement provides 350 kcal and 20 grams of protein. - Will order daily multivitamin with minerals. - Continue to encourage PO intakes.    NUTRITION DIAGNOSIS:   Increased nutrient needs related to acute illness, chronic illness, cancer and cancer related treatments as evidenced by estimated needs.  GOAL:   Patient will meet greater than or equal to 90% of their needs  MONITOR:   PO intake, Supplement acceptance, Weight trends, Labs, Skin  REASON FOR ASSESSMENT:   Malnutrition Screening Tool, Consult Assessment of nutrition requirement/status  ASSESSMENT:   78 year old female history of stage IV NSCLC on chemotherapy, lupus/rheumatoid arthritis, chronic hypoxic respiratory failure on 2-3 L [place], and COPD. She presented to the ED with SOB, productive cough, low-grade fevers x5 days, increased lower extremity edema, and nausea.  Patient with a prior tobacco history and quit in January 2020 after her diagnosis of lung cancer. CT angiogram chest was negative for PE however noticed advanced left lung malignancy, pleural effusion, and new extensive bilateral peripheral nodular and heterogeneous airspace opacity and new enlarged paratracheal lymph nodes consistent with infection versus edema.  BMI indicates normal weight. No intakes documented since admission. Patient was seen by another RD on 1/6. At that time, patient had reported ongoing decreased appetite and oral intakes which had acutely worsened with SOB/difficulty breathing around that time. NFPE done by RD on that date and findings were mild muscle wasting to temple, clavicle, and acromion process; all other areas WDL.   Per chart review, current weight is 122 lb and weight on 2/19 ws 137 lb. This indicates 15 lb weight loss (11% body weight) in the past 1 month; significant for time  frame. Highly suspect patient meets criteria for some degree of malnutrition but unable to confirm at this time.   Per Dr. Biagio Borg note from yesterday AM: acute on chronic respiratory failure 2/2 COPD exacerbation and acute on CHF, flu negative, respiratory viral panel pending, it was felt by ID that patient did not need testing for COVID-19, stage 4 NSCLC with first chemo on 10/09/2018.    Medications reviewed; 1 mg folvite/day, 20 mg IV lasix/day, 50 mg hydrodiuril/day, 60 mg solu-medrol x1 dose 3/23, 80 mg solu-medrol BID, 40 mEq K-Dur x2 dose 3/23. Labs reviewed; Ca: 8.5 mg/dl.     NUTRITION - FOCUSED PHYSICAL EXAM:  Unable to perform at this time.   Diet Order:   Diet Order            Diet Heart Room service appropriate? Yes; Fluid consistency: Thin; Fluid restriction: 1500 mL Fluid  Diet effective now        Diet heart healthy/carb modified Room service appropriate? Yes; Fluid consistency: Thin; Fluid restriction: 1500 mL Fluid  Diet effective now              EDUCATION NEEDS:   No education needs have been identified at this time  Skin:  Skin Assessment: Skin Integrity Issues: Skin Integrity Issues:: Other (Comment) Other: tiny, full thickness non-pressure wound to R buttocks  Last BM:  3/21  Height:   Ht Readings from Last 1 Encounters:  11/19/18 5\' 2"  (1.575 m)    Weight:   Wt Readings from Last 1 Encounters:  11/20/18 55.6 kg    Ideal Body Weight:  50 kg  BMI:  Body mass index is 22.42 kg/m.  Estimated Nutritional Needs:  Kcal:  1830-2000 kcal  Protein:  80-90 grams  Fluid:  >/= 1.8 L/day     Jarome Matin, MS, RD, LDN, [facility] Inpatient Clinical Dietitian Pager # [phone] After hours/weekend pager # [phone]

## 2018-11-20 NOTE — Progress Notes (Signed)
OT Cancellation Note  Patient Details Name: Donna Mcneil MRN: [id number] DOB: 28-Jun-1941   Cancelled Treatment:    Reason Eval/Treat Not Completed: Other (comment)  Per health system leadership, therapy services are being held at this time until patient tests negative for COVID-19.    Kari Baars, OT Acute Rehabilitation Services [phone] Office- [phone], Thereasa Parkin 11/20/2018, 8:36 AM

## 2018-11-20 NOTE — Progress Notes (Signed)
Was called by RN that patient noted to be tachycardic.  Went and assessed patient. General: Some use of accessory muscles of respiration however speaking in full sentences. Respiration: Lungs clear to auscultation bilaterally anterior lung fields. Cardiovascular: Irregularly irregular. Abdomen: Soft, nontender, nondistended, positive bowel sounds.  Assessment/plan 1 atrial fibrillation CHA2DS2VASC score at least 5 Patient noted to be in A. fib with heart rate sustaining in the 160s.  Likely secondary to acute lung issues of acute COPD exacerbation, volume overload and stage IV non-small cell lung cancer.  Patient denies any chest pain.  Patient denies any history of bleeding or peptic ulcer disease.  TSH done of 0.524.  Free T4 of 1.33.  Troponin which were cycled was less than 0.03.  Potassium was 3.9 this morning.  Magnesium of 2.0.  Patient with recent 2D echo in September 03, 2018 with a EF of 65 to 70%, moderately increased systolic pulmonary artery pressure.  Grade 1 diastolic dysfunction.  Mild mitral valvular stenosis.  Patient noted to have a hemoglobin of 9.1 this morning from 13.1 on admission.  Patient with no overt bleeding.  Anemia likely secondary to chemotherapy.  Cycle cardiac enzymes every 6 hours x3.  Will give Cardizem 10 mg IV x1 and place on a Cardizem drip.  Start Eliquis 2.5 mg twice daily for anticoagulation.  Monitor H&H closely and also monitor for bleeding.   Time spent 35 minutes.  Assessed patient at bedside, managing life-threatening cardiac arrhythmia.

## 2018-11-20 NOTE — Significant Event (Signed)
Rapid Response Event Note  Overview: Time Called: 1940 Arrival Time: 1950 Event Type: Other (Comment)(Review of patient)  Initial Focused Assessment: Called by primary RN after she had reviewed patient's chart and received report from ED RN, primary RN was concerned patient needed higher level of care. Patient also having some respiratory distress. Patient  short of breath, having difficulty breathing, expiratory wheeze upon auscultation, desatting on 3L O2. Patient also sinus tachycardia, having frequent PVCs leading to runs of V-tach with pulse. BP stable.  Interventions: Patient currently on low risk COVID-19 precautions. After my assessment and respiratory therapists assessment, we felt the patient needed nebulizer treatments and potentially BiPAP. Per current guidelines, patient needs to be on high risk COVID-19 precautions to received nebulizer treatments and BiPAP.   Tylene Fantasia, NP notified that patient was short of breath, having difficulty breathing, expiratory wheeze upon auscultation, desatting on 3L O2.. Orders placed for high risk COVID-19 and BiPAP. Solumedrol and inhaler given at bedside. Nebulizer orders already in place. Myself and the NP remained with patient while I coordinated the transfer of patient to a stepdown negative pressure room, under airborne precautions.  Plan of Care (if not transferred):  Event Summary:   Donna Mcneil

## 2018-11-21 ENCOUNTER — Inpatient Hospital Stay (HOSPITAL_COMMUNITY): Payer: Medicare Other

## 2018-11-21 ENCOUNTER — Other Ambulatory Visit: Payer: Medicare Other

## 2018-11-21 DIAGNOSIS — J9611 Chronic respiratory failure with hypoxia: Secondary | ICD-10-CM

## 2018-11-21 LAB — BASIC METABOLIC PANEL
Anion gap: 10 (ref 5–15)
BUN: 35 mg/dL — AB (ref 8–23)
CO2: 28 mmol/L (ref 22–32)
Calcium: 7.9 mg/dL — ABNORMAL LOW (ref 8.9–10.3)
Chloride: 95 mmol/L — ABNORMAL LOW (ref 98–111)
Creatinine, Ser: 0.93 mg/dL (ref 0.44–1.00)
GFR calc Af Amer: >60 mL/min (ref >60)
GFR, EST NON AFRICAN AMERICAN: 59 mL/min — AB (ref >60)
GLUCOSE: 158 mg/dL — AB (ref 70–99)
Potassium: 3.7 mmol/L (ref 3.5–5.1)
Sodium: 133 mmol/L — ABNORMAL LOW (ref 135–145)

## 2018-11-21 LAB — PROTIME-INR
INR: 1.2 (ref 0.8–1.2)
Prothrombin Time: 14.7 seconds (ref 11.4–15.2)

## 2018-11-21 LAB — URINE CULTURE: Culture: NO GROWTH

## 2018-11-21 LAB — CBC
HCT: 28.1 % — ABNORMAL LOW (ref 36.0–46.0)
HEMOGLOBIN: 9.1 g/dL — AB (ref 12.0–15.0)
MCH: 30.7 pg (ref 26.0–34.0)
MCHC: 32.4 g/dL (ref 30.0–36.0)
MCV: 94.9 fL (ref 80.0–100.0)
Platelets: 609 10*3/uL — ABNORMAL HIGH (ref 150–400)
RBC: 2.96 MIL/uL — ABNORMAL LOW (ref 3.87–5.11)
RDW: 18.4 % — ABNORMAL HIGH (ref 11.5–15.5)
WBC: 6.3 10*3/uL (ref 4.0–10.5)
nRBC: 1.6 % — ABNORMAL HIGH (ref 0.0–0.2)

## 2018-11-21 LAB — LIPID PANEL
CHOL/HDL RATIO: 3.1 ratio
Cholesterol: 105 mg/dL (ref 0–200)
HDL: 34 mg/dL — ABNORMAL LOW (ref >40)
LDL Cholesterol: 50 mg/dL (ref 0–99)
Triglycerides: 106 mg/dL (ref <150)
VLDL: 21 mg/dL (ref 0–40)

## 2018-11-21 LAB — TROPONIN I: Troponin I: 0.04 ng/mL (ref <0.03)

## 2018-11-21 LAB — PROCALCITONIN: PROCALCITONIN: 34.54 ng/mL

## 2018-11-21 MED ORDER — DIGOXIN 0.25 MG/ML IJ SOLN
0.2500 mg | Freq: Every day | INTRAMUSCULAR | Status: DC
  Administered 2018-11-22: 0.25 mg via INTRAVENOUS
  Filled 2018-11-21: qty 2

## 2018-11-21 MED ORDER — SODIUM CHLORIDE 0.9 % IV BOLUS
500.0000 mL | Freq: Once | INTRAVENOUS | Status: AC
  Administered 2018-11-21: 500 mL via INTRAVENOUS

## 2018-11-21 MED ORDER — DIGOXIN 0.25 MG/ML IJ SOLN
0.5000 mg | Freq: Once | INTRAMUSCULAR | Status: AC
  Administered 2018-11-21: 0.5 mg via INTRAVENOUS
  Filled 2018-11-21: qty 2

## 2018-11-21 MED ORDER — AMIODARONE IV BOLUS ONLY 150 MG/100ML
150.0000 mg | Freq: Once | INTRAVENOUS | Status: AC
  Administered 2018-11-21: 150 mg via INTRAVENOUS
  Filled 2018-11-21: qty 100

## 2018-11-21 MED ORDER — METOPROLOL TARTRATE 25 MG PO TABS: 50.0000 mg | ORAL_TABLET | Freq: Three times a day (TID) | ORAL | Status: DC

## 2018-11-21 MED ORDER — METOPROLOL TARTRATE 25 MG PO TABS
50.0000 mg | ORAL_TABLET | Freq: Three times a day (TID) | ORAL | Status: DC
  Administered 2018-11-21 (×3): 50 mg via ORAL
  Filled 2018-11-21 (×3): qty 2

## 2018-11-21 NOTE — Progress Notes (Signed)
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  Patient with a prior tobacco history and quit in January 2020 after her diagnosis of lung cancer.  Patient also presented with increased lower extremity edema with associated nausea.  CT angiogram chest was negative for PE however noticed advanced left lung malignancy, pleural effusion and new extensive bilateral peripheral nodular and heterogeneous airspace opacity and new enlarged paratracheal lymph nodes consistent with infection versus edema.    Overnight on the day of admission patient noted to go into respiratory decompensation was transferred to the ICU and placed on the BiPAP.  ID, Dr. Johnnye Sima was curbside by nurse practitioner Forrest Moron the evening of 11/19/2018 and it was felt testing for COVID 19 was not warranted at this time.  Yesterday pt went in to afib with RVR requiring IV cardizem and IV amiodarone.   Assessment & Plan:   Principal Problem:   Acute respiratory failure with hypoxia (HCC) Active Problems:   Seropositive rheumatoid arthritis (HCC)   Hyperlipidemia   Hypertension   Cutaneous lupus erythematosus   COPD (chronic obstructive pulmonary disease) (HCC)   Chronic respiratory failure with hypoxia (HCC)   Hyponatremia   Malignant pleural effusion   Adenocarcinoma of left lung, stage 4 (HCC)   Port-A-Cath in place   Pressure injury of skin   Tachycardia   Acute on chronic diastolic CHF (congestive heart failure) (HCC)   Atrial fibrillation with RVR (HCC)  Acute on chronic respiratory failure with hypoxia secondary to acute COPD exacerbation  and acute on chronic diastolic heart failure. Patient currently in stepdown requiring BiPAP yesterday. Respiratory panel is negative influenza PCR is negative pro calcitonin level still elevated.  For strep pneumo antigen is negative.Afebrile overnight, WBC count within normal limits. Steroids, continue with Lasix. Continue with Rocephin and Zithromax for questionable atypical infection. Continue with Brovana and Pulmicort.   Atrial fibrillation with RVR Heart rate still between 130s to 150s. Cardizem IV maxed out and she was given a dose of IV amiodarone earlier this morning. Cardiology consulted and recommendations given Thyroid panel within normal limits    Anemia of chronic disease Hemoglobin stable around 9.  Continue to monitor and transfuse to keep hemoglobin greater than 7.    Acute on chronic bilateral lower extremity edema probably secondary to mild acute on chronic diastolic heart failure. Improving with IV Lasix.    Stage IV non-small cell lung cancer Patient follows up with Dr. Julien Nordmann.   History of SLE and rheumatoid arthritis Patient was on methotrexate 15 mg, which is on hold for now    History of tobacco use disorder Patient recently quit smoking in January 2020.   Hypertension Well controlled resume with Norvasc and Lasix   Hyperlipidemia resume statin      DVT prophylaxis: Eliquis Code Status: DNR Family Communication: None at bedside Disposition Plan: Pending clinical improvement.   Consultants:   Cardiology  Infectious disease  Procedures: None  Antimicrobials: Rocephin and Zithromax   Subjective: No chest pain or palpitations.  Some shortness of breath and tachypnea still present  Objective: Vitals:   11/21/18 0700 11/21/18 0715 11/21/18 0746 11/21/18 0800  BP:  119/70 (!) 111/52    Pulse: (!) 29 (!) 45    Resp: (!) 31 14    Temp:    (!) 97.3 F (36.3 C)  TempSrc:    Oral  SpO2: 91% 91% 94%   Weight:      Height:         Intake/Output Summary (Last 24 hours) at 11/21/2018 0853 Last data filed at 11/21/2018 1941 Gross per 24 hour  Intake 1808.72 ml  Output 1045 ml  Net 763.72 ml   Filed Weights   11/19/18 2115 11/20/18 0312 11/21/18 0500  Weight: 56.8 kg 55.6 kg 59.2 kg    Examination:  General exam: in mild distress. Respiratory system: diminished air entry, tachypnea Cardiovascular system: S1 & S2 heard, irregular, tachycardic.  Gastrointestinal system: Abdomen is nondistended, soft and nontender. No organomegaly or masses felt. Normal bowel sounds heard. Central nervous system: Alert and oriented. No focal neurological deficits. Extremities: Bilateral lower extremity edema Skin: No rashes, lesions or ulcers Psychiatry: . Mood & affect appropriate.     Data Reviewed: I have personally reviewed following labs and imaging studies  CBC: Recent Labs  Lab 11/14/18 1134 11/19/18 1311 11/20/18 0508 11/21/18 0557  WBC 10.2 6.9 4.9 6.3  NEUTROABS 7.3 4.7  --   --   HGB 10.0* 13.1 9.1* 9.1*  HCT 29.1* 39.7 28.4* 28.1*  MCV 89.8 94.3 95.6 94.9  PLT 159 394 479* 740*   Basic Metabolic Panel: Recent Labs  Lab 11/14/18 1134 11/19/18 1311 11/20/18 0508 11/21/18 0557  NA 134* 136 138 133*  K 3.0* 3.6 3.9 3.7  CL 95* 98 99 95*  CO2 28 27 28 28   GLUCOSE 111* 121* 130* 158*  BUN 11 19 19  35*  CREATININE 0.63 0.57 0.59 0.93  CALCIUM 8.8* 9.3 8.5* 7.9*  MG  --   --  2.0  --    GFR: Estimated Creatinine Clearance: 39.4 mL/min (by C-G formula based on SCr of 0.93 mg/dL). Liver Function Tests: Recent Labs  Lab 11/14/18 1134 11/19/18 1311  AST 22 20  ALT 28 20  ALKPHOS 98 94  BILITOT 0.5 0.5  PROT 7.0 7.4  ALBUMIN 2.3* 2.4*   No results for input(s): LIPASE, AMYLASE in the last 168 hours. No results for input(s): AMMONIA in the last 168 hours. Coagulation Profile: Recent Labs  Lab 11/20/18 1102 11/21/18 0557  INR 0.9 1.2   Cardiac Enzymes: Recent Labs  Lab 11/19/18 1317  11/20/18 1121 11/20/18 1645 11/20/18 2252  TROPONINI <0.03 <0.03 0.03* 0.04*   BNP (last 3 results) No results for input(s): PROBNP in the last 8760 hours. HbA1C: No results for input(s): HGBA1C in the last 72 hours. CBG: No results for input(s): GLUCAP in the last 168 hours. Lipid Profile: Recent Labs    11/21/18 0557  CHOL 105  HDL 34*  LDLCALC 50  TRIG 106  CHOLHDL 3.1   Thyroid Function Tests: Recent Labs    11/19/18 1252  TSH 0.524  FREET4 1.33   Anemia Panel: Recent Labs    11/19/18 2113  VITAMINB12 2,524*  FOLATE 20.6  FERRITIN 843*  TIBC 189*  IRON 19*   Sepsis Labs: Recent Labs  Lab 11/19/18 1309 11/19/18 2113 11/20/18 0508 11/21/18 0557  PROCALCITON  --  59.26 49.07 34.54  LATICACIDVEN 1.3  --   --   --     Recent Results (from the past 240 hour(s))  Blood culture (routine x 2)     Status: None (Preliminary  result)   Collection Time: 11/19/18 12:46 PM  Result Value Ref Range Status   Specimen Description   Final    BLOOD PORTA CATH RIGHT SIDE Performed at [facility] [address] [place], [place] [address]    Special Requests   Final    BOTTLES DRAWN AEROBIC AND ANAEROBIC Blood Culture adequate volume Performed at [facility] [address] [place], [place] [address]    Culture   Final    NO GROWTH < 24 HOURS Performed at [facility] [address], [place], [place] [address]    Report Status PENDING  Incomplete  Blood culture (routine x 2)     Status: None (Preliminary result)   Collection Time: 11/19/18 12:52 PM  Result Value Ref Range Status   Specimen Description   Final    BLOOD RIGHT ANTECUBITAL Performed at [facility] [address], [place], [place] [address]    Special Requests   Final    BOTTLES DRAWN AEROBIC AND ANAEROBIC Blood Culture adequate volume Performed at [facility] [address] [place], [place] [address]     Culture   Final    NO GROWTH < 24 HOURS Performed at [facility] [address] [place], [place] [address]    Report Status PENDING  Incomplete  Respiratory Panel by PCR     Status: None   Collection Time: 11/19/18  9:13 PM  Result Value Ref Range Status   Adenovirus NOT DETECTED NOT DETECTED Final   Coronavirus 229E NOT DETECTED NOT DETECTED Final    Comment: (NOTE) The Coronavirus on the Respiratory Panel, DOES NOT test for the novel  Coronavirus (2019 nCoV)    Coronavirus HKU1 NOT DETECTED NOT DETECTED Final   Coronavirus NL63 NOT DETECTED NOT DETECTED Final   Coronavirus OC43 NOT DETECTED NOT DETECTED Final   Metapneumovirus NOT DETECTED NOT DETECTED Final   Rhinovirus / Enterovirus NOT DETECTED NOT DETECTED Final   Influenza A NOT DETECTED NOT DETECTED Final   Influenza B NOT DETECTED NOT DETECTED Final   Parainfluenza Virus 1 NOT DETECTED NOT DETECTED Final   Parainfluenza Virus 2 NOT DETECTED NOT DETECTED Final   Parainfluenza Virus 3 NOT DETECTED NOT DETECTED Final   Parainfluenza Virus 4 NOT DETECTED NOT DETECTED Final   Respiratory Syncytial Virus NOT DETECTED NOT DETECTED Final   Bordetella pertussis NOT DETECTED NOT DETECTED Final   Chlamydophila pneumoniae NOT DETECTED NOT DETECTED Final   Mycoplasma pneumoniae NOT DETECTED NOT DETECTED Final    Comment: Performed at [facility], [address] [place], [place] [address]  MRSA PCR Screening     Status: None   Collection Time: 11/19/18  9:13 PM  Result Value Ref Range Status   MRSA by PCR NEGATIVE NEGATIVE Final    Comment:        The GeneXpert MRSA Assay (FDA approved for NASAL specimens only), is one component of a comprehensive MRSA colonization surveillance program. It is not intended to diagnose MRSA infection nor to guide or monitor treatment for MRSA infections. Performed at [facility], [place] [address], [place], [place] [address]   Culture, Urine     Status: None    Collection Time: 11/20/18  8:00 AM  Result Value Ref Range Status   Specimen Description   Final    URINE, RANDOM Performed at [facility] [address] [place], [place] [address]  Special Requests   Final    NONE Performed at [facility], [place] [address], [place], [place] [address]    Culture   Final    NO GROWTH Performed at [facility], [place] [address], [place], [place] [address]    Report Status 11/21/2018 FINAL  Final  Culture, sputum-assessment     Status: None   Collection Time: 11/20/18 11:37 AM  Result Value Ref Range Status   Specimen Description SPUTUM  Final   Special Requests Immunocompromised  Final   Sputum evaluation   Final    THIS SPECIMEN IS ACCEPTABLE FOR SPUTUM CULTURE Performed at [facility], [place] [address], [place], [place] [address]    Report Status 11/20/2018 FINAL  Final  Culture, respiratory     Status: None (Preliminary result)   Collection Time: 11/20/18 11:37 AM  Result Value Ref Range Status   Specimen Description   Final    SPUTUM Performed at [facility] [address] [place], [place] [address]    Special Requests   Final    Immunocompromised Reflexed from [id number] Performed at [facility], [place] [address], [place], [place] [address]    Gram Stain   Final    ABUNDANT WBC PRESENT, PREDOMINANTLY PMN FEW GRAM POSITIVE COCCI FEW GRAM POSITIVE RODS Performed at [facility], [place] [address], [place], [place] [address]    Culture PENDING  Incomplete   Report Status PENDING  Incomplete         Radiology Studies: Dg Chest 2 View  Result Date: 11/19/2018 CLINICAL DATA:  Increasing cough, congestion and shortness of breath since 11/15/2017. History of lung cancer. EXAM: CHEST - 2 VIEW COMPARISON:  PA and lateral chest 10/30/2018 and 09/12/2018. PET CT scan 09/20/2018. FINDINGS: The lungs are markedly emphysematous. Small left  pleural effusion has slightly increased since the prior exam. There is some left basilar atelectasis. Left upper lobe pulmonary nodule seen on prior CT is not well demonstrated on this exam. No consolidative process pneumothorax. There is cardiomegaly. Aortic atherosclerosis is noted. IMPRESSION: No change in a small left pleural effusion. Subsegmental atelectasis left lung base has slightly increased since the most recent exam. Emphysema. Known left upper lobe pulmonary nodule is not visible on this exam. Cardiomegaly. Atherosclerosis. Electronically Signed   By: Inge Rise M.D.   On: 11/19/2018 12:41   Ct Angio Chest Pe W And/or Wo Contrast  Result Date: 11/19/2018 CLINICAL DATA:  Shortness of breath, cough, lung cancer EXAM: CT ANGIOGRAPHY CHEST WITH CONTRAST TECHNIQUE: Multidetector CT imaging of the chest was performed using the standard protocol during bolus administration of intravenous contrast. Multiplanar CT image reconstructions and MIPs were obtained to evaluate the vascular anatomy. CONTRAST:  113mL OMNIPAQUE IOHEXOL 350 MG/ML SOLN COMPARISON:  09/01/2018 FINDINGS: Cardiovascular: Satisfactory opacification of the pulmonary arteries to the segmental level. No evidence of pulmonary embolism. Normal heart size. Scattered coronary artery calcifications. Aortic atherosclerosis. No pericardial effusion. Right chest port catheter. Mediastinum/Nodes: Slight interval decrease in size of left hilar and subcarinal lymph nodes. There are new enlarged pretracheal and AP window lymph nodes (series 4, image 42). Thyroid gland, trachea, and esophagus demonstrate no significant findings. Lungs/Pleura: Small left pleural effusion with thickening and nodularity. Interval decrease in size of primary left upper lobe lung malignancy measuring 1.4 x 1.0 cm, previously 1.9 x 1.6 cm when measured similarly (series 4, image 52). There is extensive bilateral peripheral nodular and heterogeneous airspace opacity (  series  4, image 67). Underlying emphysema Upper Abdomen: No acute abnormality. Musculoskeletal: No chest wall abnormality. No acute or significant osseous findings. Review of the MIP images confirms the above findings. IMPRESSION: 1.  Negative examination for pulmonary embolism. 2. Redemonstrated stigmata of advanced left lung malignancy. Primary left upper lobe nodule appears decreased in size compared to prior examination with decreased bulk of left hilar and subcarinal lymphadenopathy. There is redemonstrated pleural thickening and nodularity with a reduced volume of pleural effusion. Findings are generally consistent with treatment response. 3. There is new, extensive bilateral peripheral nodular and heterogeneous airspace opacity and new enlarged paratracheal and AP window lymph nodes, this constellation of findings likely reflecting infection and/or edema. 4.  Underlying emphysema. Electronically Signed   By: Eddie Candle M.D.   On: 11/19/2018 15:31   Dg Chest Port 1 View  Result Date: 11/21/2018 CLINICAL DATA:  Shortness of breath EXAM: PORTABLE CHEST 1 VIEW COMPARISON:  11/19/2018 FINDINGS: Borderline heart size, normal on recent CT. Stable mediastinal contours. Porta catheter on the right in good position. Interstitial coarsening that is diffuse and increased. There is hyperinflation and emphysema. History of lung cancer. IMPRESSION: 1. Increasing interstitial opacity which could be edema or atypical infection. 2. COPD. Electronically Signed   By: Monte Fantasia M.D.   On: 11/21/2018 07:22        Scheduled Meds:  ALPRAZolam  1 mg Oral Q2000   apixaban  2.5 mg Oral BID   arformoterol  15 mcg Nebulization BID   atorvastatin  40 mg Oral Q2000   azithromycin  500 mg Oral Q24H   budesonide (PULMICORT) nebulizer solution  0.5 mg Nebulization BID   chlorhexidine  15 mL Mouth Rinse BID   Chlorhexidine Gluconate Cloth  6 each Topical Daily   feeding supplement (ENSURE ENLIVE)  237 mL Oral BID  BM   fluticasone  2 spray Each Nare Daily   folic acid  1 mg Oral Daily   furosemide  20 mg Intravenous Q12H   gabapentin  100 mg Oral TID   guaiFENesin  1,200 mg Oral BID   hydrochlorothiazide  50 mg Oral Daily   ipratropium  2 puff Inhalation Q6H   levalbuterol  2 puff Inhalation Q6H   loratadine  10 mg Oral Daily   mouth rinse  15 mL Mouth Rinse q12n4p   methylPREDNISolone (SOLU-MEDROL) injection  80 mg Intravenous Q12H   metoprolol tartrate  50 mg Oral BID   multivitamin with minerals  1 tablet Oral Daily   pantoprazole  40 mg Oral Q0600   potassium chloride  40 mEq Oral Once   sodium chloride flush  10-40 mL Intracatheter Q12H   umeclidinium-vilanterol  1 puff Inhalation Daily   Continuous Infusions:  sodium chloride Stopped (11/21/18 0516)   cefTRIAXone (ROCEPHIN)  IV 200 mL/hr at 11/20/18 2200   diltiazem (CARDIZEM) infusion 20 mg/hr (11/21/18 0600)     LOS: 2 days    Time spent: 32 minutes.    Hosie Poisson, MD [facility] Pager [phone]  If 7PM-7AM, please contact night-coverage [email or web address] Password [facility] 11/21/2018, 8:53 AM

## 2018-11-21 NOTE — Progress Notes (Signed)
Pt uop still only 80 cc since 1900.  HR although decreased to 120s for short time after NS bolus. Remains in 140s.  Pt quietly resting.  Chest xray done.  Labs sent.  Nelle Don NP paged to be made aware.  Awaiting orders

## 2018-11-21 NOTE — Evaluation (Signed)
Occupational Therapy Evaluation Patient Details Name: Donna Mcneil MRN: [id number] DOB: 1940/11/14 Today's Date: 11/21/2018    History of Present Illness 78 yo female admitted to ED on 3/23 with shortness of breath, on home O2. Pt with stage IV lung cancer. PMH includes COPD exacerbation, diverticulitis, HTN, lupus, OP, ARF with hypoxia, malignant pleural effusion, RA, peripheral neuropathy, anxiety, broncitis, loop sigmoid colectomy.    Clinical Impression   Pt admitted with the above. Pt currently with functional limitations due to the deficits listed below (see OT Problem List).  Pt will benefit from skilled OT to increase their safety and independence with ADL and functional mobility for ADL to facilitate discharge to venue listed below.      Follow Up Recommendations  SNF    Equipment Recommendations  None recommended by OT    Recommendations for Other Services       Precautions / Restrictions Precautions Precautions: Fall Precaution Comments: on 12LO2 via HFNC, watch sats  Restrictions Weight Bearing Restrictions: No      Mobility Bed Mobility Overal bed mobility: Needs Assistance Bed Mobility: Supine to Sit;Sit to Supine     Supine to sit: Min assist;HOB elevated;+2 for safety/equipment Sit to supine: Min assist;HOB elevated;+2 for safety/equipment   General bed mobility comments: Min assist for supine<>sit for LE lifting and translation to EOB, trunk support, and steadying at EOB. Pt able to sit EOB without PT/OT support with use of UE propping. Pt with dyspnea 3/4 with activity, pt requires increased time to recover breathing. Sats 92-95% on 12LO2 via HFNC during session, RR in low 20s, and HR in 70s-80s range.   Transfers Overall transfer level: (Pt defers due to fatigue and dyspnea)                    Balance Overall balance assessment: Needs assistance;History of Falls(fall during hospitalization per pt) Sitting-balance support: Bilateral  upper extremity supported;Feet unsupported Sitting balance-Leahy Scale: Fair Sitting balance - Comments: Pt able to sit EOB ~5 minutes with bilateral UE support. Pt unable to accept challenge to sitting balance.     Standing balance-Leahy Scale: (NT)                             ADL either performed or assessed with clinical judgement   ADL Overall ADL's : Needs assistance/impaired Eating/Feeding: Minimal assistance;Sitting   Grooming: Minimal assistance;Sitting   Upper Body Bathing: Moderate assistance;Sitting   Lower Body Bathing: Sitting/lateral leans;Maximal assistance;Total assistance   Upper Body Dressing : Set up;Sitting   Lower Body Dressing: Sitting/lateral leans;Maximal assistance;Total assistance                 General ADL Comments: pt fatigued VERY quickly. Pt will need significant A with ADL activity     Vision Patient Visual Report: No change from baseline       Perception     Praxis      Pertinent Vitals/Pain Pain Assessment: No/denies pain     Hand Dominance Right   Extremity/Trunk Assessment Upper Extremity Assessment Upper Extremity Assessment: Generalized weakness   Lower Extremity Assessment Lower Extremity Assessment: Generalized weakness(Pt able to perform AROM hip and knee flexion and extension, DF/PF in supine position.)   Cervical / Trunk Assessment Cervical / Trunk Assessment: Normal;Other exceptions Cervical / Trunk Exceptions: heavy accessory muscle breathing noted on eval.    Communication Communication Communication: No difficulties   Cognition Arousal/Alertness: Awake/alert Behavior During Therapy: [facility]  for tasks assessed/performed Overall Cognitive Status: Within Functional Limits for tasks assessed                                     General Comments  Pt complaining of dizziness upon sitting EOB, BP 139/71 and pulse of 77 bpm. Improved with continued sitting EOB. Pt instructed in pursed lip  breathing, and PT instructed pt in breathing in through nose and out through mouth to recover dyspnea.    Exercises     Shoulder Instructions      Home Living Family/patient expects to be discharged to:: Private residence Living Arrangements: Alone Available Help at Discharge: Neighbor;Family;Available PRN/intermittently(ex-husband and his wife, grandson, and neighbors) Type of Home: Apartment Home Access: Ramped entrance     Home Layout: One level               Home Equipment: Walker - 4 wheels          Prior Functioning/Environment Level of Independence: Independent with assistive device(s)        Comments: Pt reports utilizing rollator for ambulation. Pt wears home O2, and often feels dyspneic with ADLs. Pt states she could ask neighbors for help, but doesn't ask.         OT Problem List: Decreased strength;Decreased activity tolerance;Decreased safety awareness;Decreased knowledge of precautions;Decreased knowledge of use of DME or AE;Cardiopulmonary status limiting activity;Impaired balance (sitting and/or standing)      OT Treatment/Interventions: Self-care/ADL training;Patient/family education;DME and/or AE instruction    OT Goals(Current goals can be found in the care plan section) Acute Rehab OT Goals Patient Stated Goal: breathe better OT Goal Formulation: With patient Time For Goal Achievement: 12/05/18  OT Frequency: Min 2X/week   Barriers to D/C: Decreased caregiver support          Co-evaluation PT/OT/SLP Co-Evaluation/Treatment: Yes Reason for Co-Treatment: For patient/therapist safety PT goals addressed during session: Mobility/safety with mobility OT goals addressed during session: ADL's and self-care      AM-PAC OT "6 Clicks" Daily Activity     Outcome Measure Help from another person eating meals?: A Little Help from another person taking care of personal grooming?: A Little Help from another person toileting, which includes using  toliet, bedpan, or urinal?: Total Help from another person bathing (including washing, rinsing, drying)?: A Lot Help from another person to put on and taking off regular upper body clothing?: A Lot Help from another person to put on and taking off regular lower body clothing?: Total 6 Click Score: 12   End of Session Nurse Communication: Mobility status  Activity Tolerance: Patient tolerated treatment well Patient left: in bed;with bed alarm set  OT Visit Diagnosis: Unsteadiness on feet (R26.81);Muscle weakness (generalized) (M62.81);Other abnormalities of gait and mobility (R26.89);History of falling (Z91.81)                Time: 1126-1150 OT Time Calculation (min): 24 min Charges:  OT General Charges $OT Visit: 1 Visit OT Evaluation $OT Eval Moderate Complexity: 1 Mod  Kari Baars, [place] [phone] Office- [phone], Edwena Felty D 11/21/2018, 3:39 PM

## 2018-11-21 NOTE — Progress Notes (Signed)
Noted that patient has only had 50 cc urine out since lasix given at 2000.  Bladder scan 0 cc urine.  Confirmed by two RNs.  HR remains elevated.  Will notify on call provider

## 2018-11-21 NOTE — Evaluation (Signed)
Physical Therapy Evaluation Patient Details Name: Donna Mcneil MRN: [id number] DOB: [birth year] Today's Date: 11/21/2018   History of Present Illness  78 yo female admitted to ED on 3/23 with shortness of breath, on home O2. Pt with stage IV lung cancer. PMH includes COPD exacerbation, diverticulitis, HTN, lupus, OP, ARF with hypoxia, malignant pleural effusion, RA, peripheral neuropathy, anxiety, broncitis, loop sigmoid colectomy.   Clinical Impression   Pt presents with dyspnea at rest and increased dyspnea on exertion, LE weakness, difficulty performing bed mobility, heavy accessory muscle breathing, decreased activity tolerance, and decreased knowledge of breathing techniques to recover sats/dyspnea. Pt to benefit from acute PT to address deficits. Pt able to sit EOB for ~5 minutes, limited by fatigue and dyspnea. Pt's sats stayed 92-95% on 12LO2 via HFNC, and frequent rest breaks were required to recover dyspnea. HR and BP WNL during eval. PT recommending SNF placement to address mobility deficits. Pt currently lives alone, and pt has been having difficulty performing ADLs and tasks in the home for some time due to breathing. PT to progress mobility as tolerated, and will continue to follow acutely.      Follow Up Recommendations SNF;Supervision/Assistance - 24 hour    Equipment Recommendations  Other (comment)(defer to next venue )    Recommendations for Other Services       Precautions / Restrictions Precautions Precautions: Fall Precaution Comments: on 12LO2 via HFNC, watch sats  Restrictions Weight Bearing Restrictions: No      Mobility  Bed Mobility Overal bed mobility: Needs Assistance Bed Mobility: Supine to Sit;Sit to Supine     Supine to sit: Min assist;HOB elevated;+2 for safety/equipment Sit to supine: Min assist;HOB elevated;+2 for safety/equipment   General bed mobility comments: Min assist for supine<>sit for LE lifting and translation to EOB, trunk  support, and steadying at EOB. Pt able to sit EOB without PT/OT support with use of UE propping. Pt with dyspnea 3/4 with activity, pt requires increased time to recover breathing. Sats 92-95% on 12LO2 via HFNC during session, RR in low 20s, and HR in 70s-80s range.   Transfers Overall transfer level: (Pt defers due to fatigue and dyspnea)                  Ambulation/Gait                Stairs            Wheelchair Mobility    Modified Rankin (Stroke Patients Only)       Balance Overall balance assessment: Needs assistance;History of Falls(fall during hospitalization per pt) Sitting-balance support: Bilateral upper extremity supported;Feet unsupported Sitting balance-Leahy Scale: Fair Sitting balance - Comments: Pt able to sit EOB ~5 minutes with bilateral UE support. Pt unable to accept challenge to sitting balance.     Standing balance-Leahy Scale: (NT)                               Pertinent Vitals/Pain Pain Assessment: No/denies pain    Home Living Family/patient expects to be discharged to:: Private residence Living Arrangements: Alone Available Help at Discharge: Neighbor;Family;Available PRN/intermittently(ex-husband and his wife, grandson, and neighbors) Type of Home: Apartment Home Access: Ramped entrance     Home Layout: One level Home Equipment: Walker - 4 wheels      Prior Function Level of Independence: Independent with assistive device(s)         Comments: Pt reports utilizing rollator for  ambulation. Pt wears home O2, and often feels dyspneic with ADLs. Pt states she could ask neighbors for help, but doesn't ask.      Hand Dominance   Dominant Hand: Right    Extremity/Trunk Assessment   Upper Extremity Assessment Upper Extremity Assessment: Defer to OT evaluation    Lower Extremity Assessment Lower Extremity Assessment: Generalized weakness(Pt able to perform AROM hip and knee flexion and extension, DF/PF in  supine position.)    Cervical / Trunk Assessment Cervical / Trunk Assessment: Normal;Other exceptions Cervical / Trunk Exceptions: heavy accessory muscle breathing noted on eval.   Communication   Communication: No difficulties  Cognition Arousal/Alertness: Awake/alert Behavior During Therapy: WFL for tasks assessed/performed Overall Cognitive Status: Within Functional Limits for tasks assessed                                        General Comments General comments (skin integrity, edema, etc.): Pt complaining of dizziness upon sitting EOB, BP 139/71 and pulse of 77 bpm. Improved with continued sitting EOB. Pt instructed in pursed lip breathing, and PT instructed pt in breathing in through nose and out through mouth to recover dyspnea.    Exercises     Assessment/Plan    PT Assessment Patient needs continued PT services  PT Problem List Decreased strength;Decreased mobility;Decreased activity tolerance;Cardiopulmonary status limiting activity;Decreased balance;Decreased knowledge of use of DME       PT Treatment Interventions DME instruction;Functional mobility training;Patient/family education;Balance training;Gait training;Therapeutic activities;Therapeutic exercise;Neuromuscular re-education    PT Goals (Current goals can be found in the Care Plan section)  Acute Rehab PT Goals Patient Stated Goal: breathe better PT Goal Formulation: With patient Time For Goal Achievement: 12/05/18 Potential to Achieve Goals: Good    Frequency Min 2X/week   Barriers to discharge        Co-evaluation PT/OT/SLP Co-Evaluation/Treatment: Yes Reason for Co-Treatment: For patient/therapist safety PT goals addressed during session: Mobility/safety with mobility OT goals addressed during session: ADL's and self-care       AM-PAC PT "6 Clicks" Mobility  Outcome Measure Help needed turning from your back to your side while in a flat bed without using bedrails?: A  Lot Help needed moving from lying on your back to sitting on the side of a flat bed without using bedrails?: A Lot Help needed moving to and from a bed to a chair (including a wheelchair)?: A Lot Help needed standing up from a chair using your arms (e.g., wheelchair or bedside chair)?: A Lot Help needed to walk in hospital room?: Total Help needed climbing 3-5 steps with a railing? : Total 6 Click Score: 10    End of Session Equipment Utilized During Treatment: Oxygen Activity Tolerance: Treatment limited secondary to medical complications (Comment);Patient limited by fatigue(O2 demand and dyspnea) Patient left: in bed;with bed alarm set;with call bell/phone within reach Nurse Communication: Mobility status PT Visit Diagnosis: Muscle weakness (generalized) (M62.81);Other abnormalities of gait and mobility (R26.89)    Time: 0272-5366 PT Time Calculation (min) (ACUTE ONLY): 21 min   Charges:   PT Evaluation $PT Eval Low Complexity: 1 Low          Shakinah Navis Conception Chancy, PT Acute Rehabilitation Services Pager [phone]  Office [phone]   Katya Rolston D Keen Ewalt 11/21/2018, 2:01 PM

## 2018-11-21 NOTE — Progress Notes (Signed)
UOP picking up after second NS bolus.  HR decreasing to 120-130's after Amiodarone bolus.  Blood pressure holding well.  Will continue to monitor closely

## 2018-11-21 NOTE — Progress Notes (Addendum)
Progress Note  Patient Name: Donna Mcneil Date of Encounter: 11/21/2018  Primary Cardiologist: Dr. Johnsie Cancel  Subjective   Breathing is better, however did not put out much urine last night. Patient says she has not drink much. Denies any CP.   Inpatient Medications    Scheduled Meds:  ALPRAZolam  1 mg Oral Q2000   apixaban  2.5 mg Oral BID   arformoterol  15 mcg Nebulization BID   atorvastatin  40 mg Oral Q2000   azithromycin  500 mg Oral Q24H   budesonide (PULMICORT) nebulizer solution  0.5 mg Nebulization BID   chlorhexidine  15 mL Mouth Rinse BID   Chlorhexidine Gluconate Cloth  6 each Topical Daily   feeding supplement (ENSURE ENLIVE)  237 mL Oral BID BM   fluticasone  2 spray Each Nare Daily   folic acid  1 mg Oral Daily   furosemide  20 mg Intravenous Q12H   gabapentin  100 mg Oral TID   guaiFENesin  1,200 mg Oral BID   hydrochlorothiazide  50 mg Oral Daily   ipratropium  2 puff Inhalation Q6H   levalbuterol  2 puff Inhalation Q6H   loratadine  10 mg Oral Daily   mouth rinse  15 mL Mouth Rinse q12n4p   methylPREDNISolone (SOLU-MEDROL) injection  80 mg Intravenous Q12H   metoprolol tartrate  50 mg Oral BID   multivitamin with minerals  1 tablet Oral Daily   pantoprazole  40 mg Oral Q0600   potassium chloride  40 mEq Oral Once   sodium chloride flush  10-40 mL Intracatheter Q12H   umeclidinium-vilanterol  1 puff Inhalation Daily   Continuous Infusions:  sodium chloride Stopped (11/21/18 0516)   cefTRIAXone (ROCEPHIN)  IV 200 mL/hr at 11/20/18 2200   diltiazem (CARDIZEM) infusion 20 mg/hr (11/21/18 0600)   PRN Meds: sodium chloride, acetaminophen **OR** [DISCONTINUED] acetaminophen, acetaminophen, guaiFENesin-dextromethorphan, hydrALAZINE, HYDROcodone-acetaminophen, levalbuterol, lip balm, metoprolol tartrate, ondansetron **OR** ondansetron (ZOFRAN) IV, polyethylene glycol, polyvinyl alcohol, prochlorperazine, sodium chloride  flush   Vital Signs    Vitals:   11/21/18 0700 11/21/18 0715 11/21/18 0746 11/21/18 0800  BP: 119/70 (!) 111/52    Pulse: (!) 29 (!) 45    Resp: (!) 31 14    Temp:    (!) 97.3 F (36.3 C)  TempSrc:    Oral  SpO2: 91% 91% 94%   Weight:      Height:        Intake/Output Summary (Last 24 hours) at 11/21/2018 0819 Last data filed at 11/21/2018 1610 Gross per 24 hour  Intake 1808.72 ml  Output 1045 ml  Net 763.72 ml   Last 3 Weights 11/21/2018 11/20/2018 11/19/2018  Weight (lbs) 130 lb 8.2 oz 122 lb 9.2 oz 125 lb 3.5 oz  Weight (kg) 59.2 kg 55.6 kg 56.8 kg      Telemetry    Atrial fibrillation, HR 140s - Personally Reviewed  ECG    Atrial fibrillation with RVR - Personally Reviewed  Physical Exam   GEN: No acute distress.   Neck: No JVD Cardiac: tachycardic, no murmurs, rubs, or gallops.  Respiratory: Clear to auscultation bilaterally. GI: Soft, nontender, non-distended  MS: No edema; No deformity. Neuro:  Nonfocal  Psych: Normal affect   Labs    Chemistry Recent Labs  Lab 11/14/18 1134 11/19/18 1311 11/20/18 0508 11/21/18 0557  NA 134* 136 138 133*  K 3.0* 3.6 3.9 3.7  CL 95* 98 99 95*  CO2 28 27 28  28  GLUCOSE 111* 121* 130* 158*  BUN 11 19 19  35*  CREATININE 0.63 0.57 0.59 0.93  CALCIUM 8.8* 9.3 8.5* 7.9*  PROT 7.0 7.4  --   --   ALBUMIN 2.3* 2.4*  --   --   AST 22 20  --   --   ALT 28 20  --   --   ALKPHOS 98 94  --   --   BILITOT 0.5 0.5  --   --   GFRNONAA >60 >60 >60 59*  GFRAA >60 >60 >60 >60  ANIONGAP 11 11 11 10      Hematology Recent Labs  Lab 11/19/18 1311 11/20/18 0508 11/21/18 0557  WBC 6.9 4.9 6.3  RBC 4.21 2.97* 2.96*  HGB 13.1 9.1* 9.1*  HCT 39.7 28.4* 28.1*  MCV 94.3 95.6 94.9  MCH 31.1 30.6 30.7  MCHC 33.0 32.0 32.4  RDW 18.6* 18.2* 18.4*  PLT 394 479* 609*    Cardiac Enzymes Recent Labs  Lab 11/19/18 1317 11/20/18 1121 11/20/18 1645 11/20/18 2252  TROPONINI <0.03 <0.03 0.03* 0.04*   No results for input(s):  TROPIPOC in the last 168 hours.   BNP Recent Labs  Lab 11/19/18 1312  BNP 540.4*     DDimer No results for input(s): DDIMER in the last 168 hours.   Radiology    Dg Chest 2 View  Result Date: 11/19/2018 CLINICAL DATA:  Increasing cough, congestion and shortness of breath since 11/15/2017. History of lung cancer. EXAM: CHEST - 2 VIEW COMPARISON:  PA and lateral chest 10/30/2018 and 09/12/2018. PET CT scan 09/20/2018. FINDINGS: The lungs are markedly emphysematous. Small left pleural effusion has slightly increased since the prior exam. There is some left basilar atelectasis. Left upper lobe pulmonary nodule seen on prior CT is not well demonstrated on this exam. No consolidative process pneumothorax. There is cardiomegaly. Aortic atherosclerosis is noted. IMPRESSION: No change in a small left pleural effusion. Subsegmental atelectasis left lung base has slightly increased since the most recent exam. Emphysema. Known left upper lobe pulmonary nodule is not visible on this exam. Cardiomegaly. Atherosclerosis. Electronically Signed   By: Inge Rise M.D.   On: 11/19/2018 12:41   Ct Angio Chest Pe W And/or Wo Contrast  Result Date: 11/19/2018 CLINICAL DATA:  Shortness of breath, cough, lung cancer EXAM: CT ANGIOGRAPHY CHEST WITH CONTRAST TECHNIQUE: Multidetector CT imaging of the chest was performed using the standard protocol during bolus administration of intravenous contrast. Multiplanar CT image reconstructions and MIPs were obtained to evaluate the vascular anatomy. CONTRAST:  183mL OMNIPAQUE IOHEXOL 350 MG/ML SOLN COMPARISON:  09/01/2018 FINDINGS: Cardiovascular: Satisfactory opacification of the pulmonary arteries to the segmental level. No evidence of pulmonary embolism. Normal heart size. Scattered coronary artery calcifications. Aortic atherosclerosis. No pericardial effusion. Right chest port catheter. Mediastinum/Nodes: Slight interval decrease in size of left hilar and subcarinal  lymph nodes. There are new enlarged pretracheal and AP window lymph nodes (series 4, image 42). Thyroid gland, trachea, and esophagus demonstrate no significant findings. Lungs/Pleura: Small left pleural effusion with thickening and nodularity. Interval decrease in size of primary left upper lobe lung malignancy measuring 1.4 x 1.0 cm, previously 1.9 x 1.6 cm when measured similarly (series 4, image 52). There is extensive bilateral peripheral nodular and heterogeneous airspace opacity (series 4, image 67). Underlying emphysema Upper Abdomen: No acute abnormality. Musculoskeletal: No chest wall abnormality. No acute or significant osseous findings. Review of the MIP images confirms the above findings. IMPRESSION: 1.  Negative examination for pulmonary  embolism. 2. Redemonstrated stigmata of advanced left lung malignancy. Primary left upper lobe nodule appears decreased in size compared to prior examination with decreased bulk of left hilar and subcarinal lymphadenopathy. There is redemonstrated pleural thickening and nodularity with a reduced volume of pleural effusion. Findings are generally consistent with treatment response. 3. There is new, extensive bilateral peripheral nodular and heterogeneous airspace opacity and new enlarged paratracheal and AP window lymph nodes, this constellation of findings likely reflecting infection and/or edema. 4.  Underlying emphysema. Electronically Signed   By: Eddie Candle M.D.   On: 11/19/2018 15:31   Dg Chest Port 1 View  Result Date: 11/21/2018 CLINICAL DATA:  Shortness of breath EXAM: PORTABLE CHEST 1 VIEW COMPARISON:  11/19/2018 FINDINGS: Borderline heart size, normal on recent CT. Stable mediastinal contours. Porta catheter on the right in good position. Interstitial coarsening that is diffuse and increased. There is hyperinflation and emphysema. History of lung cancer. IMPRESSION: 1. Increasing interstitial opacity which could be edema or atypical infection. 2. COPD.  Electronically Signed   By: Monte Fantasia M.D.   On: 11/21/2018 07:22    Cardiac Studies   Echo 09/03/2018 LV EF: 65% -   70%  ------------------------------------------------------------------- Indications:      Dyspnea 786.09.  ------------------------------------------------------------------- History:   PMH:   Chronic obstructive pulmonary disease.  Risk factors:  Current tobacco use. Hypertension. Dyslipidemia.  ------------------------------------------------------------------- Study Conclusions  - Left ventricle: The cavity size was normal. There was mild focal   basal hypertrophy of the septum. Systolic function was vigorous.   The estimated ejection fraction was in the range of 65% to 70%.   Wall motion was normal; there were no regional wall motion   abnormalities. Doppler parameters are consistent with abnormal   left ventricular relaxation (grade 1 diastolic dysfunction). - Aortic valve: Trileaflet; mildly thickened, mildly calcified   leaflets. - Mitral valve: Calcified annulus. Mildly thickened, moderately   calcified leaflets . The findings are consistent with mild   stenosis. Mean gradient (D): 5 mm Hg. Valve area by continuity   equation (using LVOT flow): 1.68 cm^2. - Pulmonary arteries: Systolic pressure was moderately increased.   PA peak pressure: 49 mm Hg (S). - Pericardium, extracardiac: A trivial pericardial effusion was   identified posterior to the heart.  Patient Profile     78 y.o. 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 She was started on eliquis 2.5mg  BID.   Assessment & Plan    1. PAF with RVR: occurred in the setting of URI   - HR very resistent to rate control, likely due to combination of URI and lung cancer  - will try to aim for HR of <120 bpm. Stop HCTZ, increase metoprolol to 50mg  TID dosing.  Continue IV diltiazem. May consider low dose digoxin. Patient was given a bolus on IV amiodarone this morning which did not help with the HR.   - technically if truly decided to proceed with eliquis after discharge, she qualifies for 5mg  dosage. CHA2DS2-Vasc score 4 (female, HTN, age)  13. Acute on chronic respiratory failure: per primary team.   3. Lower extremity edema: Resolved. Cr trending up from 0.5 to 0.9, even though CXR shows possible increasing interstitial opacity which could be edema or atypical infection. Suspect she is euvolemic based on physical exam, she still has markedly diminished breath sound in the left lung,  however does not appears to be significantly volume overloaded.   4. 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 Still with chronic dyspnea As discussed yesterday unlikely to get rate below 100 Beta blocker increased add iv digoxin on low dose elquis given anemia and metastatic cancer   Jenkins Rouge

## 2018-11-22 ENCOUNTER — Inpatient Hospital Stay: Payer: Medicare Other | Admitting: Physician Assistant

## 2018-11-22 ENCOUNTER — Inpatient Hospital Stay: Payer: Medicare Other

## 2018-11-22 LAB — BASIC METABOLIC PANEL
Anion gap: 11 (ref 5–15)
BUN: 44 mg/dL — ABNORMAL HIGH (ref 8–23)
CO2: 26 mmol/L (ref 22–32)
Calcium: 7.7 mg/dL — ABNORMAL LOW (ref 8.9–10.3)
Chloride: 95 mmol/L — ABNORMAL LOW (ref 98–111)
Creatinine, Ser: 0.98 mg/dL (ref 0.44–1.00)
GFR calc Af Amer: >60 mL/min (ref >60)
GFR calc non Af Amer: 55 mL/min — ABNORMAL LOW (ref >60)
GLUCOSE: 150 mg/dL — AB (ref 70–99)
Potassium: 3.8 mmol/L (ref 3.5–5.1)
Sodium: 132 mmol/L — ABNORMAL LOW (ref 135–145)

## 2018-11-22 LAB — CBC
HCT: 28.2 % — ABNORMAL LOW (ref 36.0–46.0)
HEMOGLOBIN: 9.1 g/dL — AB (ref 12.0–15.0)
MCH: 30.8 pg (ref 26.0–34.0)
MCHC: 32.3 g/dL (ref 30.0–36.0)
MCV: 95.6 fL (ref 80.0–100.0)
Platelets: 619 10*3/uL — ABNORMAL HIGH (ref 150–400)
RBC: 2.95 MIL/uL — ABNORMAL LOW (ref 3.87–5.11)
RDW: 17.6 % — ABNORMAL HIGH (ref 11.5–15.5)
WBC: 9.1 10*3/uL (ref 4.0–10.5)
nRBC: 1 % — ABNORMAL HIGH (ref 0.0–0.2)

## 2018-11-22 LAB — CULTURE, RESPIRATORY W GRAM STAIN: Culture: NORMAL

## 2018-11-22 LAB — CULTURE, RESPIRATORY

## 2018-11-22 LAB — LEGIONELLA PNEUMOPHILA SEROGP 1 UR AG: L. pneumophila Serogp 1 Ur Ag: NEGATIVE

## 2018-11-22 LAB — MAGNESIUM: Magnesium: 2.4 mg/dL (ref 1.7–2.4)

## 2018-11-22 MED ORDER — METHYLPREDNISOLONE SODIUM SUCC 40 MG IJ SOLR
40.0000 mg | Freq: Two times a day (BID) | INTRAMUSCULAR | Status: DC
  Administered 2018-11-22 – 2018-11-23 (×2): 40 mg via INTRAVENOUS
  Filled 2018-11-22 (×2): qty 1

## 2018-11-22 MED ORDER — LEVALBUTEROL TARTRATE 45 MCG/ACT IN AERO
2.0000 | INHALATION_SPRAY | Freq: Three times a day (TID) | RESPIRATORY_TRACT | Status: DC
  Administered 2018-11-22 – 2018-12-01 (×28): 2 via RESPIRATORY_TRACT

## 2018-11-22 MED ORDER — METOPROLOL TARTRATE 5 MG/5ML IV SOLN
2.5000 mg | Freq: Once | INTRAVENOUS | Status: AC
  Administered 2018-11-22: 2.5 mg via INTRAVENOUS

## 2018-11-22 MED ORDER — METOPROLOL TARTRATE 50 MG PO TABS
75.0000 mg | ORAL_TABLET | Freq: Three times a day (TID) | ORAL | Status: DC
  Administered 2018-11-22 – 2018-11-26 (×13): 75 mg via ORAL
  Filled 2018-11-22 (×3): qty 1
  Filled 2018-11-22: qty 3
  Filled 2018-11-22 (×4): qty 1
  Filled 2018-11-22 (×5): qty 3

## 2018-11-22 MED ORDER — DILTIAZEM HCL-DEXTROSE 100-5 MG/100ML-% IV SOLN (PREMIX)
5.0000 mg/h | INTRAVENOUS | Status: DC
  Administered 2018-11-22 – 2018-11-24 (×2): 5 mg/h via INTRAVENOUS
  Filled 2018-11-22 (×2): qty 100

## 2018-11-22 MED ORDER — DILTIAZEM LOAD VIA INFUSION
10.0000 mg | Freq: Once | INTRAVENOUS | Status: AC
  Administered 2018-11-22: 10 mg via INTRAVENOUS
  Filled 2018-11-22: qty 10

## 2018-11-22 MED ORDER — IPRATROPIUM BROMIDE HFA 17 MCG/ACT IN AERS
2.0000 | INHALATION_SPRAY | Freq: Three times a day (TID) | RESPIRATORY_TRACT | Status: DC
  Administered 2018-11-22 – 2018-12-01 (×28): 2 via RESPIRATORY_TRACT

## 2018-11-22 NOTE — Progress Notes (Signed)
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  Patient with a prior tobacco history and quit in January 2020 after her diagnosis of lung cancer.  Patient also presented with increased lower extremity edema with associated nausea.  CT angiogram chest was negative for PE however noticed advanced left lung malignancy, pleural effusion and new extensive bilateral peripheral nodular and heterogeneous airspace opacity and new enlarged paratracheal lymph nodes consistent with infection versus edema.    Overnight on the day of admission patient noted to go into respiratory decompensation was transferred to the ICU and placed on the BiPAP.  ID, Dr. Johnnye Sima was curbside by nurse practitioner Forrest Moron the evening of 11/19/2018 and it was felt testing for COVID 19 was not warranted at this time.   Assessment & Plan:   Principal Problem:   Acute respiratory failure with hypoxia (HCC) Active Problems:   Seropositive rheumatoid arthritis (HCC)   Hyperlipidemia   Hypertension   Cutaneous lupus erythematosus   COPD (chronic obstructive pulmonary disease) (HCC)   Chronic respiratory failure with hypoxia (HCC)   Hyponatremia   Malignant pleural effusion   Adenocarcinoma of left lung, stage 4 (HCC)   Port-A-Cath in place   Pressure injury of skin   Tachycardia   Acute on chronic diastolic CHF (congestive heart failure) (HCC)   Atrial fibrillation with RVR (HCC)  Acute on chronic respiratory failure with hypoxia secondary to acute COPD exacerbation and acute on chronic diastolic heart failure. Patient currently in stepdown ,  trying to wean her off the oxygen.  Respiratory panel is negative influenza PCR is negative pro calcitonin level still elevated.   Urine strep pneumo antigen is negative. Afebrile overnight, WBC count within normal limits. Start tapering steroids as her wheezing has improved and we will try to wean her oxygen as appropraite.  Continue with Rocephin and Zithromax for questionable atypical infection. Continue with Brovana and Pulmicort.   Atrial fibrillation with RVR Rate better today but oral metoprolol.  On IV digoxin.  Cardiology consulted and recommendations given Thyroid panel within normal limits.    Anemia of chronic disease Hemoglobin stable around 9.  Continue to monitor and transfuse to keep hemoglobin greater than 7.    Acute on chronic bilateral lower extremity edema probably secondary to mild acute on chronic diastolic heart failure. Improving with IV Lasix. Continue with strict intake and output.  Fluid restriction and daily weights.     Stage IV non-small cell lung cancer Patient follows up with Dr. Julien Nordmann.   History of SLE and rheumatoid arthritis Patient was on methotrexate 15 mg, which is on hold for now    History of tobacco use disorder Patient recently quit smoking in January 2020.   Hypertension Well controlled resume with Norvasc and Lasix   Hyperlipidemia resume statin      DVT prophylaxis: Eliquis Code Status: DNR Family Communication: None at bedside Disposition Plan: Pending clinical improvement. Possible transfer to the floor if able to wean her oxygen appropriate for the floor.    Consultants:   Cardiology  Infectious disease  Procedures: None  Antimicrobials: Rocephin and Zithromax   Subjective: No chest pain or  palpitations.  Some shortness of breath and tachypnea still present. Still taking pauses while talking.   Objective: Vitals:   11/22/18 0500 11/22/18 0600 11/22/18 0825 11/22/18 0826  BP: 106/79 (!) 148/60     Pulse: 63 (!) 26    Resp: 14 15    Temp:   97.6 F (36.4 C)   TempSrc:   Oral   SpO2: 100% 100% 100% 100%  Weight:      Height:        Intake/Output Summary (Last 24 hours) at 11/22/2018 1259 Last data filed at 11/22/2018 0600 Gross per 24 hour  Intake 377.78 ml  Output 735 ml  Net -357.22 ml   Filed Weights   11/20/18 0312 11/21/18 0500 11/22/18 0346  Weight: 55.6 kg 59.2 kg 57.8 kg    Examination:  General exam: in mild distress still on 8 lit of [place] oxygen.  Respiratory system: diminished air entry, tachypnea Cardiovascular system: S1 & S2 heard, irregular, tachycardic.  Gastrointestinal system: Abdomen is soft NT nd bs+ Central nervous system: Alert and oriented. No focal neurological deficits. Extremities: Bilateral lower extremity edema Skin: No rashes, lesions or ulcers Psychiatry: . Mood & affect appropriate.     Data Reviewed: I have personally reviewed following labs and imaging studies  CBC: Recent Labs  Lab 11/19/18 1311 11/20/18 0508 11/21/18 0557 11/22/18 0355  WBC 6.9 4.9 6.3 9.1  NEUTROABS 4.7  --   --   --   HGB 13.1 9.1* 9.1* 9.1*  HCT 39.7 28.4* 28.1* 28.2*  MCV 94.3 95.6 94.9 95.6  PLT 394 479* 609* 782*   Basic Metabolic Panel: Recent Labs  Lab 11/19/18 1311 11/20/18 0508 11/21/18 0557 11/22/18 0355  NA 136 138 133* 132*  K 3.6 3.9 3.7 3.8  CL 98 99 95* 95*  CO2 27 28 28 26   GLUCOSE 121* 130* 158* 150*  BUN 19 19 35* 44*  CREATININE 0.57 0.59 0.93 0.98  CALCIUM 9.3 8.5* 7.9* 7.7*  MG  --  2.0  --  2.4   GFR: Estimated Creatinine Clearance: 37.4 mL/min (by C-G formula based on SCr of 0.98 mg/dL). Liver Function Tests: Recent Labs  Lab 11/19/18 1311  AST 20  ALT 20  ALKPHOS 94  BILITOT 0.5  PROT 7.4  ALBUMIN 2.4*   No results for input(s): LIPASE, AMYLASE in the last 168 hours. No results for input(s): AMMONIA in the last 168 hours. Coagulation Profile: Recent Labs  Lab 11/20/18 1102 11/21/18 0557  INR 0.9 1.2     Cardiac Enzymes: Recent Labs  Lab 11/19/18 1317 11/20/18 1121 11/20/18 1645 11/20/18 2252  TROPONINI <0.03 <0.03 0.03* 0.04*   BNP (last 3 results) No results for input(s): PROBNP in the last 8760 hours. HbA1C: No results for input(s): HGBA1C in the last 72 hours. CBG: No results for input(s): GLUCAP in the last 168 hours. Lipid Profile: Recent Labs    11/21/18 0557  CHOL 105  HDL 34*  LDLCALC 50  TRIG 106  CHOLHDL 3.1   Thyroid Function Tests: No results for input(s): TSH, T4TOTAL, FREET4, T3FREE, THYROIDAB in the last 72 hours. Anemia Panel: Recent Labs    11/19/18 2113  VITAMINB12 2,524*  FOLATE 20.6  FERRITIN 843*  TIBC 189*  IRON 19*   Sepsis Labs: Recent Labs  Lab 11/19/18 1309 11/19/18 2113 11/20/18 0508 11/21/18 0557  PROCALCITON  --  59.26 49.07 34.54  LATICACIDVEN 1.3  --   --   --     Recent  Results (from the past 240 hour(s))  Blood culture (routine x 2)     Status: None (Preliminary result)   Collection Time: 11/19/18 12:46 PM  Result Value Ref Range Status   Specimen Description   Final    BLOOD PORTA CATH RIGHT SIDE Performed at [facility] [address], [place], [place] [address]    Special Requests   Final    BOTTLES DRAWN AEROBIC AND ANAEROBIC Blood Culture adequate volume Performed at [facility] [address] [place], [place] [address]    Culture   Final    NO GROWTH 3 DAYS Performed at [facility], [place] [address] [place], [place] [address]    Report Status PENDING  Incomplete  Blood culture (routine x 2)     Status: None (Preliminary result)   Collection Time: 11/19/18 12:52 PM  Result Value Ref Range Status   Specimen Description   Final    BLOOD RIGHT ANTECUBITAL Performed at [facility] [address] [place], [place] [address]    Special Requests   Final    BOTTLES DRAWN AEROBIC AND ANAEROBIC Blood Culture adequate volume Performed at  [facility] [address], [place], [place] [address]    Culture   Final    NO GROWTH 3 DAYS Performed at [facility], [place] [address], [place], [place] [address]    Report Status PENDING  Incomplete  Respiratory Panel by PCR     Status: None   Collection Time: 11/19/18  9:13 PM  Result Value Ref Range Status   Adenovirus NOT DETECTED NOT DETECTED Final   Coronavirus 229E NOT DETECTED NOT DETECTED Final    Comment: (NOTE) The Coronavirus on the Respiratory Panel, DOES NOT test for the novel  Coronavirus (2019 nCoV)    Coronavirus HKU1 NOT DETECTED NOT DETECTED Final   Coronavirus NL63 NOT DETECTED NOT DETECTED Final   Coronavirus OC43 NOT DETECTED NOT DETECTED Final   Metapneumovirus NOT DETECTED NOT DETECTED Final   Rhinovirus / Enterovirus NOT DETECTED NOT DETECTED Final   Influenza A NOT DETECTED NOT DETECTED Final   Influenza B NOT DETECTED NOT DETECTED Final   Parainfluenza Virus 1 NOT DETECTED NOT DETECTED Final   Parainfluenza Virus 2 NOT DETECTED NOT DETECTED Final   Parainfluenza Virus 3 NOT DETECTED NOT DETECTED Final   Parainfluenza Virus 4 NOT DETECTED NOT DETECTED Final   Respiratory Syncytial Virus NOT DETECTED NOT DETECTED Final   Bordetella pertussis NOT DETECTED NOT DETECTED Final   Chlamydophila pneumoniae NOT DETECTED NOT DETECTED Final   Mycoplasma pneumoniae NOT DETECTED NOT DETECTED Final    Comment: Performed at [facility], [address] [place], [place] [address]  MRSA PCR Screening     Status: None   Collection Time: 11/19/18  9:13 PM  Result Value Ref Range Status   MRSA by PCR NEGATIVE NEGATIVE Final    Comment:        The GeneXpert MRSA Assay (FDA approved for NASAL specimens only), is one component of a comprehensive MRSA colonization surveillance program. It is not intended to diagnose MRSA infection nor to guide or monitor treatment for MRSA infections. Performed at [facility], [place] [address] [place], [place] [address]   Culture, Urine     Status: None   Collection Time: 11/20/18  8:00 AM  Result Value Ref Range Status   Specimen Description   Final  URINE, RANDOM Performed at [facility], [place] [address], [place], [place] [address]    Special Requests   Final    NONE Performed at [facility], [place] [address] [place], [place] [address]    Culture   Final    NO GROWTH Performed at [facility], [place] [address], [place], [place] [address]    Report Status 11/21/2018 FINAL  Final  Culture, sputum-assessment     Status: None   Collection Time: 11/20/18 11:37 AM  Result Value Ref Range Status   Specimen Description SPUTUM  Final   Special Requests Immunocompromised  Final   Sputum evaluation   Final    THIS SPECIMEN IS ACCEPTABLE FOR SPUTUM CULTURE Performed at [facility], [place] [address] [place], [place] [address]    Report Status 11/20/2018 FINAL  Final  Culture, respiratory     Status: None   Collection Time: 11/20/18 11:37 AM  Result Value Ref Range Status   Specimen Description   Final    SPUTUM Performed at [facility] [address] [place], [place] [address]    Special Requests   Final    Immunocompromised Reflexed from [id number] Performed at [facility], [place] [address] [place], [place] [address]    Gram Stain   Final    ABUNDANT WBC PRESENT, PREDOMINANTLY PMN FEW GRAM POSITIVE COCCI FEW GRAM POSITIVE RODS    Culture   Final    FEW Consistent with normal respiratory flora. Performed at [facility], [place] [address], [place], [place] [address]    Report Status 11/22/2018 FINAL  Final         Radiology Studies: Dg Chest Port 1 View  Result Date: 11/21/2018 CLINICAL DATA:  Shortness of breath EXAM: PORTABLE CHEST 1 VIEW COMPARISON:  11/19/2018 FINDINGS: Borderline heart size, normal on recent CT. Stable mediastinal contours.  Porta catheter on the right in good position. Interstitial coarsening that is diffuse and increased. There is hyperinflation and emphysema. History of lung cancer. IMPRESSION: 1. Increasing interstitial opacity which could be edema or atypical infection. 2. COPD. Electronically Signed   By: Monte Fantasia M.D.   On: 11/21/2018 07:22        Scheduled Meds:  ALPRAZolam  1 mg Oral Q2000   apixaban  2.5 mg Oral BID   arformoterol  15 mcg Nebulization BID   atorvastatin  40 mg Oral Q2000   azithromycin  500 mg Oral Q24H   budesonide (PULMICORT) nebulizer solution  0.5 mg Nebulization BID   chlorhexidine  15 mL Mouth Rinse BID   Chlorhexidine Gluconate Cloth  6 each Topical Daily   digoxin  0.25 mg Intravenous Daily   feeding supplement (ENSURE ENLIVE)  237 mL Oral BID BM   fluticasone  2 spray Each Nare Daily   folic acid  1 mg Oral Daily   furosemide  20 mg Intravenous Q12H   gabapentin  100 mg Oral TID   guaiFENesin  1,200 mg Oral BID   ipratropium  2 puff Inhalation TID   levalbuterol  2 puff Inhalation TID   loratadine  10 mg Oral Daily   mouth rinse  15 mL Mouth Rinse q12n4p   methylPREDNISolone (SOLU-MEDROL) injection  40 mg Intravenous Q12H   metoprolol tartrate  75 mg Oral TID   multivitamin with minerals  1 tablet Oral Daily   pantoprazole  40 mg Oral Q0600   potassium chloride  40 mEq  Oral Once   sodium chloride flush  10-40 mL Intracatheter Q12H   umeclidinium-vilanterol  1 puff Inhalation Daily   Continuous Infusions:  sodium chloride 10 mL/hr at 11/21/18 1800   cefTRIAXone (ROCEPHIN)  IV Stopped (11/21/18 2059)   diltiazem (CARDIZEM) infusion Stopped (11/21/18 1647)     LOS: 3 days    Time spent: 32 minutes.    Hosie Poisson, MD [facility] Pager [phone]  If 7PM-7AM, please contact night-coverage [email or web address] Password [facility] 11/22/2018, 12:59 PM

## 2018-11-22 NOTE — TOC Initial Note (Signed)
Transition of Care [facility]) - Initial/Assessment Note    Patient Details  Name: Donna Mcneil MRN: [id number] Date of Birth: 01-11-1941  Transition of [facility]) CM/SW Contact:    Nila Nephew, LCSW Phone Number: [phone] 11/22/2018, 11:38 AM  Clinical Narrative:   Pt admitted from home for COPD exacerbation/respiratory failure. She resides with alone but exhusband, grandson, family friend, and a private aide assist in her care. Pt typically ambulates with a walker and has home O2- exhusband reports she does not have a portable tank due to [place] not covering it.  Pt of the cancer center getting treatment lung cancer-ex husband states she "has had 3 or for rounds of chemo, was supposed to get one today." States he or a friend help with taking her to appointments.  Ex husband reports pt has been to SNF for rehab in the past and had home health therapy and "neither have been very helpful, she will definitely not agree to go back to a SNF, but she may be open to home health when she comes back home." Ex husband expressed "I am very happy with all the care she has gotten here and at the cancer center except we felt like we got an attitude from someone in the ED when I brought her here." CSW encouraged him to follow up with patient experience. TOC team will follow during hospitalization for disposition needs.                  Expected Discharge Plan: [place] Barriers to Discharge: Continued Medical Work up   Patient Goals and CMS Choice Patient states their goals for this hospitalization and ongoing recovery are:: "go home" CMS Medicare.gov Compare Post Acute Care list provided to:: (na) Choice offered to / list presented to : NA  Expected Discharge Plan and Services Expected Discharge Plan: [place]       Living arrangements for the past 2 months: Single Family Home Expected Discharge Date: (unknown)                        Prior Living Arrangements/Services Living arrangements for the past 2 months: Single Family Home Lives with:: Self(ex husband, grandson, and aide assist) Patient language and need for interpreter reviewed:: No Do you feel safe going back to the place where you live?: Yes      Need for Family Participation in Patient Care: Yes (Comment)(ex husband helps with care) Care giver support system in place?: Yes (comment)(family and home aide) Current home services: DME(walker, oxygen) Criminal Activity/Legal Involvement Pertinent to Current Situation/Hospitalization: No - Comment as needed  Activities of Daily Living Home Assistive Devices/Equipment: Environmental consultant (specify type), Eyeglasses, Shower chair with back ADL Screening (condition at time of admission) Patient's cognitive ability adequate to safely complete daily activities?: Yes Is the patient deaf or have difficulty hearing?: No Does the patient have difficulty seeing, even when wearing glasses/contacts?: No Does the patient have difficulty concentrating, remembering, or making decisions?: No Patient able to express need for assistance with ADLs?: Yes Does the patient have difficulty dressing or bathing?: Yes Independently performs ADLs?: No Communication: Independent Dressing (OT): Independent Grooming: Independent Feeding: Independent Bathing: Needs assistance Is this a change from baseline?: Pre-admission baseline Toileting: Needs assistance Is this a change from baseline?: Pre-admission baseline In/Out Bed: Needs assistance Is this a change from baseline?: Pre-admission baseline Walks in Home: Needs assistance Is this a change from baseline?: Pre-admission  baseline Does the patient have difficulty walking or climbing stairs?: Yes Weakness of Legs: Both Weakness of Arms/Hands: Both  Permission Sought/Granted Permission sought to share information with : Family Supports Permission granted to share information with : Yes, Verbal  Permission Granted(is contact and caretaker per chart)  Share Information with NAME: Hilario Quarry ex husband [phone]           Emotional Assessment       Orientation: : Oriented to Self, Oriented to Place, Oriented to  Time, Oriented to Situation(per chart) Alcohol / Substance Use: Not Applicable Psych Involvement: No (comment)  Admission diagnosis:  Shortness of Breath Patient Active Problem List   Diagnosis Date Noted  . Pressure injury of skin 11/20/2018  . Tachycardia   . Acute on chronic diastolic CHF (congestive heart failure) ([place])   . Atrial fibrillation with RVR ([place])   . Acute respiratory failure with hypoxia ([place]) 11/19/2018  . Port-A-Cath in place 10/24/2018  . Adenocarcinoma of left lung, stage 4 ([place]) 09/26/2018  . Goals of care, counseling/discussion 09/26/2018  . Encounter for antineoplastic chemotherapy 09/26/2018  . Community acquired pneumonia of left lower lobe of lung ([place])   . Mass of upper lobe of left lung 09/02/2018  . COPD (chronic obstructive pulmonary disease) ([place]) 09/01/2018  . Chronic respiratory failure with hypoxia ([place]) 09/01/2018  . Hyponatremia 09/01/2018  . 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  . 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 Rheumatoid arthritis involving multiple sites with positive rheumatoid factor ([place]) 12/12/2017  . Tobacco use 04/14/2017  . Carotid stenosis 04/14/2017  . Peripheral neuropathy 06/24/2016  . 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 Chronic bronchitis ([place]) 11/13/2015  . Osteoarthritis 10/05/2015  . Hyperlipidemia 10/05/2015  . Hypertension 10/05/2015  . Osteoporosis 10/05/2015  . Acid reflux 10/05/2015  . Peptic ulcer 10/05/2015  . Cataract 10/05/2015  . Tendinitis 10/05/2015  . Osteopenia 02/11/2014  . Seropositive rheumatoid arthritis ([place]) 02/11/2014   PCP:  [name], PA Pharmacy:   [facility], [place]  [address] Phone: [phone] Fax: [place], [place] [facility] [address] [place] [address] [place] [address] Phone: [phone] Fax: [phone]     Social Determinants of Health ([place]) Interventions    Readmission Risk Interventions Readmission Risk Prevention Plan 11/22/2018  Transportation Screening Complete  Medication Review ([place]) Complete  PCP or Specialist appointment within 3-5 days of discharge Complete  HRI or [place] Complete  SW Recovery Care/Counseling Consult Complete  Palliative Care Screening Not [facility] Complete  Some recent data might be hidden

## 2018-11-22 NOTE — Progress Notes (Addendum)
Progress Note  Patient Name: Donna Mcneil Date of Encounter: 11/22/2018  Primary Cardiologist: Dr. Johnsie Cancel  Subjective   Pt feeling ok today. In NSR/AF with frequent PVC's. No chest pain or palpitations   Inpatient Medications    Scheduled Meds: . ALPRAZolam  1 mg Oral Q2000  . apixaban  2.5 mg Oral BID  . arformoterol  15 mcg Nebulization BID  . atorvastatin  40 mg Oral Q2000  . azithromycin  500 mg Oral Q24H  . budesonide (PULMICORT) nebulizer solution  0.5 mg Nebulization BID  . chlorhexidine  15 mL Mouth Rinse BID  . Chlorhexidine Gluconate Cloth  6 each Topical Daily  . digoxin  0.25 mg Intravenous Daily  . feeding supplement (ENSURE ENLIVE)  237 mL Oral BID BM  . fluticasone  2 spray Each Nare Daily  . folic acid  1 mg Oral Daily  . furosemide  20 mg Intravenous Q12H  . gabapentin  100 mg Oral TID  . guaiFENesin  1,200 mg Oral BID  . ipratropium  2 puff Inhalation TID  . levalbuterol  2 puff Inhalation TID  . loratadine  10 mg Oral Daily  . mouth rinse  15 mL Mouth Rinse q12n4p  . methylPREDNISolone (SOLU-MEDROL) injection  80 mg Intravenous Q12H  . metoprolol tartrate  50 mg Oral TID  . multivitamin with minerals  1 tablet Oral Daily  . pantoprazole  40 mg Oral Q0600  . potassium chloride  40 mEq Oral Once  . sodium chloride flush  10-40 mL Intracatheter Q12H  . umeclidinium-vilanterol  1 puff Inhalation Daily   Continuous Infusions: . sodium chloride 10 mL/hr at 11/21/18 1800  . cefTRIAXone (ROCEPHIN)  IV Stopped (11/21/18 2059)  . diltiazem (CARDIZEM) infusion Stopped (11/21/18 1647)   PRN Meds: sodium chloride, acetaminophen **OR** [DISCONTINUED] acetaminophen, acetaminophen, guaiFENesin-dextromethorphan, hydrALAZINE, HYDROcodone-acetaminophen, levalbuterol, lip balm, metoprolol tartrate, ondansetron **OR** ondansetron (ZOFRAN) IV, polyethylene glycol, polyvinyl alcohol, prochlorperazine, sodium chloride flush   Vital Signs    Vitals:   11/22/18 0346 11/22/18 0400 11/22/18 0500 11/22/18 0600  BP:  (!) 137/58 106/79 (!) 148/60  Pulse:  (!) 54 63 (!) 26  Resp:  15 14 15   Temp:  (!) 97.4 F (36.3 C)    TempSrc:  Oral    SpO2:  100% 100% 100%  Weight: 57.8 kg     Height:        Intake/Output Summary (Last 24 hours) at 11/22/2018 0745 Last data filed at 11/22/2018 0600 Gross per 24 hour  Intake 792.97 ml  Output 875 ml  Net -82.03 ml   Filed Weights   11/20/18 0312 11/21/18 0500 11/22/18 0346  Weight: 55.6 kg 59.2 kg 57.8 kg    Physical Exam   General: Frail, NAD Skin: Warm, dry, intact  Head: Normocephalic, atraumatic, clear, moist mucus membranes. Neck: Negative for carotid bruits. No JVD Lungs: Bilateral upper and lower extremity wheezing Breathing is unlabored. Cardiovascular: RRR with S1 S2. No murmurs, rubs, gallops, or LV heave appreciated. Abdomen: Soft, non-tender, non-distended with normoactive bowel sounds. No obvious abdominal masses. MSK: Strength and tone appear normal for age. 5/5 in all extremities Extremities: No edema. No clubbing or cyanosis. DP/PT pulses 2+ bilaterally Neuro: Alert and oriented. No focal deficits. No facial asymmetry. MAE spontaneously. Psych: Responds to questions appropriately with normal affect.    Labs    Chemistry Recent Labs  Lab 11/19/18 1311 11/20/18 0508 11/21/18 0557 11/22/18 0355  NA 136 138 133* 132*  K  3.6 3.9 3.7 3.8  CL 98 99 95* 95*  CO2 27 28 28 26   GLUCOSE 121* 130* 158* 150*  BUN 19 19 35* 44*  CREATININE 0.57 0.59 0.93 0.98  CALCIUM 9.3 8.5* 7.9* 7.7*  PROT 7.4  --   --   --   ALBUMIN 2.4*  --   --   --   AST 20  --   --   --   ALT 20  --   --   --   ALKPHOS 94  --   --   --   BILITOT 0.5  --   --   --   GFRNONAA >60 >60 59* 55*  GFRAA >60 >60 >60 >60  ANIONGAP 11 11 10 11      Hematology Recent Labs  Lab 11/20/18 0508 11/21/18 0557 11/22/18 0355  WBC 4.9 6.3 9.1  RBC 2.97* 2.96* 2.95*  HGB 9.1* 9.1* 9.1*  HCT 28.4* 28.1*  28.2*  MCV 95.6 94.9 95.6  MCH 30.6 30.7 30.8  MCHC 32.0 32.4 32.3  RDW 18.2* 18.4* 17.6*  PLT 479* 609* 619*    Cardiac Enzymes Recent Labs  Lab 11/19/18 1317 11/20/18 1121 11/20/18 1645 11/20/18 2252  TROPONINI <0.03 <0.03 0.03* 0.04*   No results for input(s): TROPIPOC in the last 168 hours.   BNP Recent Labs  Lab 11/19/18 1312  BNP 540.4*     DDimer No results for input(s): DDIMER in the last 168 hours.   Radiology    Dg Chest Port 1 View  Result Date: 11/21/2018 CLINICAL DATA:  Shortness of breath EXAM: PORTABLE CHEST 1 VIEW COMPARISON:  11/19/2018 FINDINGS: Borderline heart size, normal on recent CT. Stable mediastinal contours. Porta catheter on the right in good position. Interstitial coarsening that is diffuse and increased. There is hyperinflation and emphysema. History of lung cancer. IMPRESSION: 1. Increasing interstitial opacity which could be edema or atypical infection. 2. COPD. Electronically Signed   By: Monte Fantasia M.D.   On: 11/21/2018 07:22   Telemetry    NSR with PVC - Personally Reviewed  ECG    No new tracing as of 11/22/2018 - Personally Reviewed  Cardiac Studies   Echo 09/03/2018 LV EF: 65% - 70%  ------------------------------------------------------------------- Indications: Dyspnea 786.09.  ------------------------------------------------------------------- History: PMH: Chronic obstructive pulmonary disease. Risk factors: Current tobacco use. Hypertension. Dyslipidemia.  ------------------------------------------------------------------- Study Conclusions  - Left ventricle: The cavity size was normal. There was mild focal basal hypertrophy of the septum. Systolic function was vigorous. The estimated ejection fraction was in the range of 65% to 70%. Wall motion was normal; there were no regional wall motion abnormalities. Doppler parameters are consistent with abnormal left ventricular relaxation  (grade 1 diastolic dysfunction). - Aortic valve: Trileaflet; mildly thickened, mildly calcified leaflets. - Mitral valve: Calcified annulus. Mildly thickened, moderately calcified leaflets . The findings are consistent with mild stenosis. Mean gradient (D): 5 mm Hg. Valve area by continuity equation (using LVOT flow): 1.68 cm^2. - Pulmonary arteries: Systolic pressure was moderately increased. PA peak pressure: 49 mm Hg (S). - Pericardium, extracardiac: A trivial pericardial effusion was identified posterior to the heart.  Patient Profile     78 y.o. 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 She was started on eliquis 2.5mg  BID.   Assessment & Plan    1. Paroxysmal atrial fibrillation with RVR: -Likely in the  setting of acute underlying illness with URI, fever>>>treated with Diltiazem gtt with moderate control -Aim for HR <120bpm  -HCTZ stopped 11/21/2018 and metoprolol increased to 50mg  TID>>>will increase metoprolol to 75mg  TID given stable BP and frequent PVC -Not currently on IV Diltiazem  -Low dose digoxin 0.25mg  added 11/21/2018 -Continue ELiquis 2.5mg  BID  -Will check Mg+ level  -CHA2DS2VASc =4 (sex, age, HTN)  2. Acute on chronic respiratory failure:  -per primary team  3. Lower extremity edema:  -Resolved, on IV Lasix 20mg  BID -Creatinine stable but rising  -CXR shows possible increasing interstitial opacity which could be edema or atypical infection. -Appears euvolemic on exam   4. 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  Patient examined chart reviewed Exam  with less anxiety. No active wheezing Port a cath right pre pectoral area Telemetry with frequent PVC;s Mag level ordered. Converted to NSR with frequent PVCls continue low dose eliquis given metastatic lung cancer and higher dose beta blocker  Jenkins Rouge

## 2018-11-22 NOTE — Progress Notes (Signed)
Central tele notified charge RN (the Probation officer) that pt HR was sustaining in the 150s-160s. Vital signs obtained, BP 138/89. PRN dose of 2.5mg  metoprolol IV given. NP on call notified. After PRN dose given primary nurse notified NP that HR was still remaining in the 150s despite 2.5mg  metoprolol. NP placed order for additional one time dose 2.5mg  metoprolol IV. Will continue to monitor patient closely.

## 2018-11-22 NOTE — Progress Notes (Signed)
Pt visibly short of breath. Experiencing Orthopnea, becomes anxious and increasingly dyspneic when HOB not at 90 degrees. O2 [place] at 5L O2 sats are 88-90%. Pt requesting breathing tx. RT called and states they cannot give nebulizer tx due to orange banner on chart stating possible Novel Corona. In report, nurse states MD took pt off of isolation precautions. MD notified of breathing/oxygenation status. MD states pt is a "Low risk Covid Pt". CN notified due to no low risk precautions in place. Verbal order given for 5-10 min intermittent use of her 100% nonrebreathing to alleviate shortness of breath. CN to contact [facility] to clarify the appropriate precautions that should be in place to assure the safety of staff and other pts.

## 2018-11-23 DIAGNOSIS — J441 Chronic obstructive pulmonary disease with (acute) exacerbation: Secondary | ICD-10-CM

## 2018-11-23 LAB — BASIC METABOLIC PANEL
Anion gap: 9 (ref 5–15)
BUN: 34 mg/dL — ABNORMAL HIGH (ref 8–23)
CO2: 30 mmol/L (ref 22–32)
Calcium: 7.5 mg/dL — ABNORMAL LOW (ref 8.9–10.3)
Chloride: 94 mmol/L — ABNORMAL LOW (ref 98–111)
Creatinine, Ser: 0.7 mg/dL (ref 0.44–1.00)
GFR calc Af Amer: >60 mL/min (ref >60)
GFR calc non Af Amer: >60 mL/min (ref >60)
Glucose, Bld: 248 mg/dL — ABNORMAL HIGH (ref 70–99)
Potassium: 3.4 mmol/L — ABNORMAL LOW (ref 3.5–5.1)
Sodium: 133 mmol/L — ABNORMAL LOW (ref 135–145)

## 2018-11-23 MED ORDER — PREDNISONE 20 MG PO TABS
40.0000 mg | ORAL_TABLET | Freq: Every day | ORAL | Status: AC
  Administered 2018-11-24 – 2018-11-26 (×3): 40 mg via ORAL
  Filled 2018-11-23 (×3): qty 2

## 2018-11-23 MED ORDER — POTASSIUM CHLORIDE CRYS ER 20 MEQ PO TBCR
40.0000 meq | EXTENDED_RELEASE_TABLET | Freq: Once | ORAL | Status: AC
  Administered 2018-11-23: 40 meq via ORAL
  Filled 2018-11-23: qty 2

## 2018-11-23 MED ORDER — FUROSEMIDE 20 MG PO TABS
20.0000 mg | ORAL_TABLET | Freq: Two times a day (BID) | ORAL | Status: DC
  Administered 2018-11-23 – 2018-11-28 (×10): 20 mg via ORAL
  Filled 2018-11-23 (×10): qty 1

## 2018-11-23 NOTE — Progress Notes (Addendum)
Progress Note  Patient Name: Donna Mcneil Date of Encounter: 11/23/2018  Primary Cardiologist: Dr. Johnsie Cancel  Subjective   Pt feeling well this AM. HR controlled now. Pt seems to desaturate with elevated rates. There is room for gtt titration with BP   Inpatient Medications    Scheduled Meds: . ALPRAZolam  1 mg Oral Q2000  . apixaban  2.5 mg Oral BID  . arformoterol  15 mcg Nebulization BID  . atorvastatin  40 mg Oral Q2000  . azithromycin  500 mg Oral Q24H  . budesonide (PULMICORT) nebulizer solution  0.5 mg Nebulization BID  . chlorhexidine  15 mL Mouth Rinse BID  . Chlorhexidine Gluconate Cloth  6 each Topical Daily  . feeding supplement (ENSURE ENLIVE)  237 mL Oral BID BM  . fluticasone  2 spray Each Nare Daily  . folic acid  1 mg Oral Daily  . furosemide  20 mg Intravenous Q12H  . gabapentin  100 mg Oral TID  . guaiFENesin  1,200 mg Oral BID  . ipratropium  2 puff Inhalation TID  . levalbuterol  2 puff Inhalation TID  . loratadine  10 mg Oral Daily  . mouth rinse  15 mL Mouth Rinse q12n4p  . methylPREDNISolone (SOLU-MEDROL) injection  40 mg Intravenous Q12H  . metoprolol tartrate  75 mg Oral TID  . multivitamin with minerals  1 tablet Oral Daily  . pantoprazole  40 mg Oral Q0600  . potassium chloride  40 mEq Oral Once  . sodium chloride flush  10-40 mL Intracatheter Q12H  . umeclidinium-vilanterol  1 puff Inhalation Daily   Continuous Infusions: . sodium chloride 10 mL/hr at 11/23/18 0600  . cefTRIAXone (ROCEPHIN)  IV Stopped (11/22/18 2217)  . diltiazem (CARDIZEM) infusion 5 mg/hr (11/23/18 0600)   PRN Meds: sodium chloride, acetaminophen **OR** [DISCONTINUED] acetaminophen, acetaminophen, guaiFENesin-dextromethorphan, hydrALAZINE, HYDROcodone-acetaminophen, levalbuterol, lip balm, metoprolol tartrate, ondansetron **OR** ondansetron (ZOFRAN) IV, polyethylene glycol, polyvinyl alcohol, prochlorperazine, sodium chloride flush   Vital Signs    Vitals:    11/23/18 0400 11/23/18 0500 11/23/18 0600 11/23/18 0700  BP: (!) 130/49 (!) 123/48 136/65 (!) 143/47  Pulse: (!) 55 (!) 54 62 62  Resp: 12 13 (!) 23 20  Temp: (!) 97.5 F (36.4 C)     TempSrc: Oral     SpO2: 90% (!) 87% 95% 95%  Weight:      Height:        Intake/Output Summary (Last 24 hours) at 11/23/2018 0738 Last data filed at 11/23/2018 0600 Gross per 24 hour  Intake 1503.3 ml  Output 2100 ml  Net -596.7 ml   Filed Weights   11/22/18 0346 11/22/18 1817 11/23/18 0208  Weight: 57.8 kg 58.3 kg 59.1 kg    Physical Exam   General: Well developed, well nourished, NAD Skin: Warm, dry, intact  Head: Normocephalic, atraumatic, sclera non-icteric, no xanthomas, clear, moist mucus membranes. Neck: Negative for carotid bruits. No JVD Lungs: Rhonchi in bilateral lower lobes. Breathing is unlabored. Cardiovascular: Irregularly irregular with S1 S2. No murmurs, rubs, gallops, or LV heave appreciated. Abdomen: Soft, non-tender, non-distended with normoactive bowel sounds. No obvious abdominal masses. MSK: Strength and tone appear normal for age. 5/5 in all extremities Extremities: No edema. No clubbing or cyanosis. DP/PT pulses 2+ bilaterally Neuro: Alert and oriented. No focal deficits. No facial asymmetry. MAE spontaneously. Psych: Responds to questions appropriately with normal affect.    Labs    Chemistry Recent Labs  Lab 11/19/18 1311 11/20/18 6834  11/21/18 0557 11/22/18 0355  NA 136 138 133* 132*  K 3.6 3.9 3.7 3.8  CL 98 99 95* 95*  CO2 27 28 28 26   GLUCOSE 121* 130* 158* 150*  BUN 19 19 35* 44*  CREATININE 0.57 0.59 0.93 0.98  CALCIUM 9.3 8.5* 7.9* 7.7*  PROT 7.4  --   --   --   ALBUMIN 2.4*  --   --   --   AST 20  --   --   --   ALT 20  --   --   --   ALKPHOS 94  --   --   --   BILITOT 0.5  --   --   --   GFRNONAA >60 >60 59* 55*  GFRAA >60 >60 >60 >60  ANIONGAP 11 11 10 11      Hematology Recent Labs  Lab 11/20/18 0508 11/21/18 0557 11/22/18 0355   WBC 4.9 6.3 9.1  RBC 2.97* 2.96* 2.95*  HGB 9.1* 9.1* 9.1*  HCT 28.4* 28.1* 28.2*  MCV 95.6 94.9 95.6  MCH 30.6 30.7 30.8  MCHC 32.0 32.4 32.3  RDW 18.2* 18.4* 17.6*  PLT 479* 609* 619*    Cardiac Enzymes Recent Labs  Lab 11/19/18 1317 11/20/18 1121 11/20/18 1645 11/20/18 2252  TROPONINI <0.03 <0.03 0.03* 0.04*   No results for input(s): TROPIPOC in the last 168 hours.   BNP Recent Labs  Lab 11/19/18 1312  BNP 540.4*     DDimer No results for input(s): DDIMER in the last 168 hours.   Radiology    No results found.  Telemetry    11/23/2018 AF/NSR - Personally Reviewed  ECG    No new traciong as of 11/23/2018 - Personally Reviewed  Cardiac Studies   Echo 09/03/2018 LV EF: 65% - 70%  ------------------------------------------------------------------- Indications: Dyspnea 786.09.  ------------------------------------------------------------------- History: PMH: Chronic obstructive pulmonary disease. Risk factors: Current tobacco use. Hypertension. Dyslipidemia.  ------------------------------------------------------------------- Study Conclusions  - Left ventricle: The cavity size was normal. There was mild focal basal hypertrophy of the septum. Systolic function was vigorous. The estimated ejection fraction was in the range of 65% to 70%. Wall motion was normal; there were no regional wall motion abnormalities. Doppler parameters are consistent with abnormal left ventricular relaxation (grade 1 diastolic dysfunction). - Aortic valve: Trileaflet; mildly thickened, mildly calcified leaflets. - Mitral valve: Calcified annulus. Mildly thickened, moderately calcified leaflets . The findings are consistent with mild stenosis. Mean gradient (D): 5 mm Hg. Valve area by continuity equation (using LVOT flow): 1.68 cm^2. - Pulmonary arteries: Systolic pressure was moderately increased. PA peak pressure: 49 mm Hg (S). -  Pericardium, extracardiac: A trivial pericardial effusion was identified posterior to the heart.  Patient Profile     78 y.o. 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 She was started on eliquis 2.5mg  BID.  Assessment & Plan    1. Paroxysmal atrial fibrillation with RVR: -Likely in the setting of acute underlying illness with URI, fever>>>treated with Diltiazem gtt with moderate control -Pt went back into AF with RVR overnight>>>cardiology fellow paged with orders to restart Dilt gtt with bolus  -HR today>>70's with stable O2 saturations    -Metoprolol increased to 75mg  TID given stable BP and frequent PVC -Low dose digoxin 0.25mg  added 11/21/2018 -Continue ELiquis 2.5mg  BID  -CHA2DS2VASc =4 (sex, age, HTN)  2. Acute on chronic  respiratory failure:  -Per primary team  3. Lower extremity edema:  -Resolved, on IV Lasix 20mg  BID -Creatinine stable but rising>> 0.98 today  -CXR shows possible increasing interstitial opacity which could be edema or atypical infection. -Appears euvolemic on exam   4. 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  Rhythm doing better but has very fragile oxygenation status with metastatic lung CA. I stopped her digoxin last Night as she is in NSR with PVC;s and EF is normal by echo Continue low dose eliquis and high dose beta blocker Port a cath under left clavicle  Cardiology will sign off

## 2018-11-23 NOTE — Progress Notes (Signed)
OT Cancellation Note  Patient Details Name: JAHAIRA EARNHART MRN: [id number] DOB: 03-11-1941   Cancelled Treatment:    Reason Eval/Treat Not Completed: Medical issues which prohibited therapy per nursing  [facility] 11/23/2018, 11:18 AM  Lesle Chris, OTR/L Acute Rehabilitation Services [phone] [facility] pager [phone] office 11/23/2018

## 2018-11-23 NOTE — Progress Notes (Signed)
Per Dr. Karleen Hampshire - patient is not being tested for COVID-19 and her respiratory virus panel came back negative. Patient does not require any precautions.  Soyla Dryer, RN

## 2018-11-23 NOTE — Progress Notes (Signed)
PT Cancellation Note  Patient Details Name: AISLYNN CIFELLI MRN: [id number] DOB: Nov 07, 1940   Cancelled Treatment:     PT deferred at request of nursing - pt transferred to ICU over-night and RN reports severe SOB and elevated HR with minimal exertion.  Will follow.   Jaquay Posthumus 11/23/2018, 9:16 AM

## 2018-11-23 NOTE — Progress Notes (Signed)
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  Patient with a prior tobacco history and quit in January 2020 after her diagnosis of lung cancer.  Patient also presented with increased lower extremity edema with associated nausea.  CT angiogram chest was negative for PE however noticed advanced left lung malignancy, pleural effusion and new extensive bilateral peripheral nodular and heterogeneous airspace opacity and new enlarged paratracheal lymph nodes consistent with infection versus edema.  ID, Dr. Johnnye Sima was curbside by nurse practitioner Forrest Moron the evening of 11/19/2018 and it was felt testing for COVID 19 was not warranted at this time.  Overnight pt was transferred back to step down as she went into afib with RVR.  Palliative care consulted for goals of care.    Assessment & Plan:   Principal Problem:   Acute respiratory failure with hypoxia (HCC) Active Problems:   Seropositive rheumatoid arthritis (HCC)   Hyperlipidemia   Hypertension   Cutaneous lupus erythematosus   COPD (chronic obstructive pulmonary disease) (HCC)   Chronic respiratory failure with hypoxia (HCC)   Hyponatremia   Malignant pleural effusion   Adenocarcinoma of left lung, stage 4 (HCC)   Port-A-Cath in place   Pressure injury of skin   Tachycardia   Acute on chronic diastolic CHF (congestive heart failure) (HCC)   Atrial fibrillation with RVR (HCC)  Acute on chronic respiratory failure with hypoxia secondary to acute COPD exacerbation and acute on chronic diastolic heart failure. Patient currently in stepdown , trying to  wean her off the oxygen.  Respiratory panel is negative influenza PCR is negative pro calcitonin level still elevated.   Urine strep pneumo antigen and legionella antigen is negative. Afebrile overnight, WBC count within normal limits. Start tapering steroids as her wheezing has improved and we will try to wean her oxygen as appropraite.  Continue with Rocephin and Zithromax for questionable atypical infection. Continue with Brovana and Pulmicort.   Atrial fibrillation with RVR Rate better today but oral metoprolol.  On IV digoxin.  Cardiology consulted and recommendations given Thyroid panel within normal limits.    Anemia of chronic disease Hemoglobin stable around 9.  Continue to monitor and transfuse to keep hemoglobin greater than 7.    Acute on chronic bilateral lower extremity edema probably secondary to mild acute on chronic diastolic heart failure. Improving with IV Lasix. Change to oral.  Continue with strict intake and output.  Fluid restriction and daily weights.     Stage IV non-small cell lung cancer Patient follows up with Dr. Julien Nordmann.   History of SLE and rheumatoid arthritis Patient was on methotrexate 15 mg, which is on hold for now    History of tobacco use disorder Patient recently quit smoking in January 2020.   Hypertension Well controlled resume with Norvasc and Lasix   Hyperlipidemia resume statin      DVT prophylaxis: Eliquis Code Status: DNR Family Communication: None at bedside Disposition Plan: Pending clinical improvement. Possible transfer to the floor if able to wean her oxygen appropriate for the floor.    Consultants:   Cardiology  Infectious disease  Procedures: None  Antimicrobials: Rocephin and Zithromax  Subjective: No new complaints.  Objective: Vitals:   11/23/18 0500 11/23/18 0600 11/23/18 0700 11/23/18 0800  BP: (!) 123/48 136/65 (!) 143/47   Pulse: (!) 54 62 62   Resp: 13 (!) 23 20   Temp:    98 F  (36.7 C)  TempSrc:    Oral  SpO2: (!) 87% 95% 95%   Weight:      Height:        Intake/Output Summary (Last 24 hours) at 11/23/2018 0832 Last data filed at 11/23/2018 0600 Gross per 24 hour  Intake 1503.3 ml  Output 2100 ml  Net -596.7 ml   Filed Weights   11/22/18 0346 11/22/18 1817 11/23/18 0208  Weight: 57.8 kg 58.3 kg 59.1 kg    Examination:  General exam: in mild distress still on 8 lit of [place] oxygen.  Respiratory system: diminished air entry, tachypnea Cardiovascular system: S1 & S2 heard, irregular, tachycardic.  Gastrointestinal system: Abdomen is soft NT nd bs+ Central nervous system: Alert and oriented. No focal neurological deficits. Extremities: Bilateral lower extremity edema Skin: No rashes, lesions or ulcers Psychiatry: . Mood & affect appropriate.     Data Reviewed: I have personally reviewed following labs and imaging studies  CBC: Recent Labs  Lab 11/19/18 1311 11/20/18 0508 11/21/18 0557 11/22/18 0355  WBC 6.9 4.9 6.3 9.1  NEUTROABS 4.7  --   --   --   HGB 13.1 9.1* 9.1* 9.1*  HCT 39.7 28.4* 28.1* 28.2*  MCV 94.3 95.6 94.9 95.6  PLT 394 479* 609* 008*   Basic Metabolic Panel: Recent Labs  Lab 11/19/18 1311 11/20/18 0508 11/21/18 0557 11/22/18 0355  NA 136 138 133* 132*  K 3.6 3.9 3.7 3.8  CL 98 99 95* 95*  CO2 27 28 28 26   GLUCOSE 121* 130* 158* 150*  BUN 19 19 35* 44*  CREATININE 0.57 0.59 0.93 0.98  CALCIUM 9.3 8.5* 7.9* 7.7*  MG  --  2.0  --  2.4   GFR: Estimated Creatinine Clearance: 37.4 mL/min (by C-G formula based on SCr of 0.98 mg/dL). Liver Function Tests: Recent Labs  Lab 11/19/18 1311  AST 20  ALT 20  ALKPHOS 94  BILITOT 0.5  PROT 7.4  ALBUMIN 2.4*   No results for input(s): LIPASE, AMYLASE in the last 168 hours. No results for input(s): AMMONIA in the last 168 hours. Coagulation Profile: Recent Labs  Lab 11/20/18 1102 11/21/18 0557  INR 0.9 1.2   Cardiac Enzymes: Recent Labs  Lab 11/19/18 1317  11/20/18 1121 11/20/18 1645 11/20/18 2252  TROPONINI <0.03 <0.03 0.03* 0.04*   BNP (last 3 results) No results for input(s): PROBNP in the last 8760 hours. HbA1C: No results for input(s): HGBA1C in the last 72 hours. CBG: No results for input(s): GLUCAP in the last 168 hours. Lipid Profile: Recent Labs    11/21/18 0557  CHOL 105  HDL 34*  LDLCALC 50  TRIG 106  CHOLHDL 3.1   Thyroid Function Tests: No results for input(s): TSH, T4TOTAL, FREET4, T3FREE, THYROIDAB in the last 72 hours. Anemia Panel: No results for input(s): VITAMINB12, FOLATE, FERRITIN, TIBC, IRON, RETICCTPCT in the last 72 hours. Sepsis Labs: Recent Labs  Lab 11/19/18 1309 11/19/18 2113 11/20/18 0508 11/21/18 0557  PROCALCITON  --  59.26 49.07 34.54  LATICACIDVEN 1.3  --   --   --     Recent Results (from the past 240 hour(s))  Blood culture (routine x 2)     Status: None (Preliminary  result)   Collection Time: 11/19/18 12:46 PM  Result Value Ref Range Status   Specimen Description   Final    BLOOD PORTA CATH RIGHT SIDE Performed at [facility] [address] [place], [place] [address]    Special Requests   Final    BOTTLES DRAWN AEROBIC AND ANAEROBIC Blood Culture adequate volume Performed at [facility] [address], [place], [place] [address]    Culture   Final    NO GROWTH 4 DAYS Performed at [facility], [place] [address], [place], [place] [address]    Report Status PENDING  Incomplete  Blood culture (routine x 2)     Status: None (Preliminary result)   Collection Time: 11/19/18 12:52 PM  Result Value Ref Range Status   Specimen Description   Final    BLOOD RIGHT ANTECUBITAL Performed at [facility] [address] [place], [place] [address]    Special Requests   Final    BOTTLES DRAWN AEROBIC AND ANAEROBIC Blood Culture adequate volume Performed at [facility] [address], [place],  [place] [address]    Culture   Final    NO GROWTH 4 DAYS Performed at [facility], [place] [address] [place], [place] [address]    Report Status PENDING  Incomplete  Respiratory Panel by PCR     Status: None   Collection Time: 11/19/18  9:13 PM  Result Value Ref Range Status   Adenovirus NOT DETECTED NOT DETECTED Final   Coronavirus 229E NOT DETECTED NOT DETECTED Final    Comment: (NOTE) The Coronavirus on the Respiratory Panel, DOES NOT test for the novel  Coronavirus (2019 nCoV)    Coronavirus HKU1 NOT DETECTED NOT DETECTED Final   Coronavirus NL63 NOT DETECTED NOT DETECTED Final   Coronavirus OC43 NOT DETECTED NOT DETECTED Final   Metapneumovirus NOT DETECTED NOT DETECTED Final   Rhinovirus / Enterovirus NOT DETECTED NOT DETECTED Final   Influenza A NOT DETECTED NOT DETECTED Final   Influenza B NOT DETECTED NOT DETECTED Final   Parainfluenza Virus 1 NOT DETECTED NOT DETECTED Final   Parainfluenza Virus 2 NOT DETECTED NOT DETECTED Final   Parainfluenza Virus 3 NOT DETECTED NOT DETECTED Final   Parainfluenza Virus 4 NOT DETECTED NOT DETECTED Final   Respiratory Syncytial Virus NOT DETECTED NOT DETECTED Final   Bordetella pertussis NOT DETECTED NOT DETECTED Final   Chlamydophila pneumoniae NOT DETECTED NOT DETECTED Final   Mycoplasma pneumoniae NOT DETECTED NOT DETECTED Final    Comment: Performed at [facility], [address] [place], [place] [address]  MRSA PCR Screening     Status: None   Collection Time: 11/19/18  9:13 PM  Result Value Ref Range Status   MRSA by PCR NEGATIVE NEGATIVE Final    Comment:        The GeneXpert MRSA Assay (FDA approved for NASAL specimens only), is one component of a comprehensive MRSA colonization surveillance program. It is not intended to diagnose MRSA infection nor to guide or monitor treatment for MRSA infections. Performed at [facility], [place] [address], [place], [place] [address]   Culture, Urine      Status: None   Collection Time: 11/20/18  8:00 AM  Result Value Ref Range Status   Specimen Description   Final    URINE, RANDOM Performed at [facility] [address], [place],  [address]    Special  Requests   Final    NONE Performed at [facility], [place] [address] [place], [place] [address]    Culture   Final    NO GROWTH Performed at [facility], [place] [address] [place], [place] [address]    Report Status 11/21/2018 FINAL  Final  Culture, sputum-assessment     Status: None   Collection Time: 11/20/18 11:37 AM  Result Value Ref Range Status   Specimen Description SPUTUM  Final   Special Requests Immunocompromised  Final   Sputum evaluation   Final    THIS SPECIMEN IS ACCEPTABLE FOR SPUTUM CULTURE Performed at [facility], [place] [address] [place], [place] [address]    Report Status 11/20/2018 FINAL  Final  Culture, respiratory     Status: None   Collection Time: 11/20/18 11:37 AM  Result Value Ref Range Status   Specimen Description   Final    SPUTUM Performed at [facility] [address], [place], [place] [address]    Special Requests   Final    Immunocompromised Reflexed from [id number] Performed at [facility], [place] [address] [place], [place] [address]    Gram Stain   Final    ABUNDANT WBC PRESENT, PREDOMINANTLY PMN FEW GRAM POSITIVE COCCI FEW GRAM POSITIVE RODS    Culture   Final    FEW Consistent with normal respiratory flora. Performed at [facility], [place] [address], [place],  [address]    Report Status 11/22/2018 FINAL  Final         Radiology Studies: No results found.      Scheduled Meds: . ALPRAZolam  1 mg Oral Q2000  . apixaban  2.5 mg Oral BID  . atorvastatin  40 mg Oral Q2000  . azithromycin  500 mg Oral Q24H  . chlorhexidine  15 mL Mouth Rinse BID  . Chlorhexidine Gluconate Cloth  6 each Topical Daily  .  feeding supplement (ENSURE ENLIVE)  237 mL Oral BID BM  . fluticasone  2 spray Each Nare Daily  . folic acid  1 mg Oral Daily  . furosemide  20 mg Intravenous Q12H  . gabapentin  100 mg Oral TID  . guaiFENesin  1,200 mg Oral BID  . ipratropium  2 puff Inhalation TID  . levalbuterol  2 puff Inhalation TID  . loratadine  10 mg Oral Daily  . mouth rinse  15 mL Mouth Rinse q12n4p  . methylPREDNISolone (SOLU-MEDROL) injection  40 mg Intravenous Q12H  . metoprolol tartrate  75 mg Oral TID  . multivitamin with minerals  1 tablet Oral Daily  . pantoprazole  40 mg Oral Q0600  . potassium chloride  40 mEq Oral Once  . sodium chloride flush  10-40 mL Intracatheter Q12H  . umeclidinium-vilanterol  1 puff Inhalation Daily   Continuous Infusions: . sodium chloride 10 mL/hr at 11/23/18 0600  . cefTRIAXone (ROCEPHIN)  IV Stopped (11/22/18 2217)  . diltiazem (CARDIZEM) infusion 5 mg/hr (11/23/18 0600)     LOS: 4 days    Time spent: 28 minutes.    Hosie Poisson, MD [facility] Pager [phone]  If 7PM-7AM, please contact night-coverage [email or web address] Password [facility] 11/23/2018, 8:32 AM

## 2018-11-23 NOTE — Progress Notes (Signed)
As per [facility] RN note, Dr Karleen Hampshire, pt not covid risk. This is removed from her banner.

## 2018-11-23 NOTE — Progress Notes (Signed)
Patient complaining of feeling like something is stuck in her throat. States she feels a fullness/heaviness in her epigastric region. Patient vitals are stable, 93% on room air, sinus rhythm on the monitor but patient has many PVCs, bigeminy, 3 beat run of V-tach earlier in the shift. Dr. Silas [place] paged.  Roselyn Reef Shataya Winkles,RN

## 2018-11-23 NOTE — Progress Notes (Signed)
2200: Pt went into A.Fib with RVR HR in 150s-160s. Pt had just received her PM dose of Metoprolol but NP on call was notified and pt received an additional 5mg  Metoprolol IV per NP orders. NP Bodenheimer gave verbal order to monitor pt for 1 hour after Metoprolol given to see if HR decreases and if not to notify him. Order RBAV. See note from charge RN and [facility] for further details in regards to event.  2245: 1 hr after Metoprolol given, pt's HR remains in 150s-160s with VSS and pt asymptomatic. NP Bodenheimer notified per his request and order was given for Cardizem IV bolus and to start Cardizem infusion. See Orders and MAR for further administration details and flow sheets for VS monitoring.  2355: After 45 mins of pt being on Cardizem infusion, pt's HR remains in 140s-160s with labile SBP in 90s-100s. Due to SBP, Cardizem infusion could not be titrated as per protocol. NP Bodenheimer notified and increased LOC requested due to HR not decrease and fear of pt decompensating on floor. NP Bodenheimer agreed with ICU transfer since pt has been in A.Fib with RVR for over 3hrs. Transfer orders entered and waiting for ICU bed assignment. Will continue to monitor pt closely.   0100: SBAR RN-to-RN report given to [employer]. Pt brought to room 1240 and received by McKenzie at bedside. Pt awake, alert/oriented x3 and understands the reason why she has been transferred to ICU, VSS, resp even and unlabored, skin pink/warm/dry and in no acute distress.. Remote tele discontinued and pt placed on ICU continuous cardiac monitor.

## 2018-11-24 DIAGNOSIS — I480 Paroxysmal atrial fibrillation: Secondary | ICD-10-CM

## 2018-11-24 DIAGNOSIS — I5033 Acute on chronic diastolic (congestive) heart failure: Secondary | ICD-10-CM

## 2018-11-24 LAB — BASIC METABOLIC PANEL
Anion gap: 7 (ref 5–15)
BUN: 29 mg/dL — ABNORMAL HIGH (ref 8–23)
CO2: 33 mmol/L — ABNORMAL HIGH (ref 22–32)
Calcium: 7.8 mg/dL — ABNORMAL LOW (ref 8.9–10.3)
Chloride: 97 mmol/L — ABNORMAL LOW (ref 98–111)
Creatinine, Ser: 0.65 mg/dL (ref 0.44–1.00)
GFR calc Af Amer: >60 mL/min (ref >60)
GFR calc non Af Amer: >60 mL/min (ref >60)
Glucose, Bld: 91 mg/dL (ref 70–99)
POTASSIUM: 3.8 mmol/L (ref 3.5–5.1)
Sodium: 137 mmol/L (ref 135–145)

## 2018-11-24 LAB — CULTURE, BLOOD (ROUTINE X 2)
CULTURE: NO GROWTH
Culture: NO GROWTH
Special Requests: ADEQUATE
Special Requests: ADEQUATE

## 2018-11-24 MED ORDER — FLECAINIDE ACETATE 50 MG PO TABS
50.0000 mg | ORAL_TABLET | Freq: Two times a day (BID) | ORAL | Status: DC
  Administered 2018-11-24 – 2018-12-01 (×15): 50 mg via ORAL
  Filled 2018-11-24 (×15): qty 1

## 2018-11-24 MED ORDER — LEVALBUTEROL TARTRATE 45 MCG/ACT IN AERO
2.0000 | INHALATION_SPRAY | RESPIRATORY_TRACT | Status: DC | PRN
  Filled 2018-11-24: qty 15

## 2018-11-24 MED ORDER — PROPOFOL 1000 MG/100ML IV EMUL
INTRAVENOUS | Status: AC
  Filled 2018-11-24: qty 100

## 2018-11-24 NOTE — Progress Notes (Signed)
HR 50's. Tele states junctional beats. BP 136/49. Asymptomatic and resting. Cardizem gtt stopped. Bodenheimer aware. Will continue to monitor.

## 2018-11-24 NOTE — Progress Notes (Signed)
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  Patient with a prior tobacco history and quit in January 2020 after her diagnosis of lung cancer.  Patient also presented with increased lower extremity edema with associated nausea.  CT angiogram chest was negative for PE however noticed advanced left lung malignancy, pleural effusion and new extensive bilateral peripheral nodular and heterogeneous airspace opacity and new enlarged paratracheal lymph nodes consistent with infection versus edema.  ID, Dr. Johnnye Sima was curbside by nurse practitioner Forrest Moron the evening of 11/19/2018 and it was felt testing for COVID 19 was not warranted at this time.  Overnight pt was transferred back to step down as she went into afib with RVR.  Palliative care consulted for goals of care.    Assessment & Plan:   Principal Problem:   Acute respiratory failure with hypoxia (HCC) Active Problems:   Seropositive rheumatoid arthritis (HCC)   Hyperlipidemia   Hypertension   Cutaneous lupus erythematosus   COPD (chronic obstructive pulmonary disease) (HCC)   Chronic respiratory failure with hypoxia (HCC)   Hyponatremia   Malignant pleural effusion   Adenocarcinoma of left lung, stage 4 (HCC)   Port-A-Cath in place   Pressure injury of skin   Tachycardia   Acute on chronic diastolic CHF (congestive heart failure) (HCC)   Atrial fibrillation with RVR (HCC)  Acute on chronic respiratory failure with hypoxia secondary to acute COPD exacerbation and acute on chronic diastolic heart failure. Patient currently in stepdown , trying to  wean her off the oxygen.  Respiratory panel is negative influenza PCR is negative pro calcitonin level still elevated.   Urine strep pneumo antigen and legionella antigen is negative. Afebrile overnight, WBC count within normal limits. Start tapering steroids as her wheezing has improved and we will try to wean her oxygen as appropraite.  Continue with Rocephin and Zithromax for questionable atypical infection. Continue with Brovana and Pulmicort. Repeat CXR today.    Atrial fibrillation with RVR Rate better today  And in sinus rhythm with  oral metoprolol.  D/c IV cardizem and IV digoxin. She was started on flecainide by cardiology.  Thyroid panel within normal limits.    Anemia of chronic disease Hemoglobin stable around 9.  Continue to monitor and transfuse to keep hemoglobin greater than 7.    Acute on chronic bilateral lower extremity edema probably secondary to mild acute on chronic diastolic heart failure. Improving with IV Lasix. Change to oral.  Continue with strict intake and output.  Fluid restriction and daily weights.     Stage IV non-small cell lung cancer Patient follows up with Dr. Julien Nordmann.   History of SLE and rheumatoid arthritis Patient was on methotrexate 15 mg, which is on hold for now    History of tobacco use disorder Patient recently quit smoking in January 2020.   Hypertension Well controlled resume with Norvasc and Lasix   Hyperlipidemia resume statin      DVT prophylaxis: Eliquis Code Status: DNR Family Communication: None at bedside Disposition Plan: Pending clinical improvement. Possible transfer to the floor if able to wean her oxygen appropriate for the floor.    Consultants:  Cardiology  Infectious disease  Procedures: None  Antimicrobials: Rocephin and Zithromax   Subjective: No new complaints.  No chest pain or sob.   Objective: Vitals:   11/24/18 0817 11/24/18 0853 11/24/18 0854 11/24/18 0925  BP:   (!)  178/87   Pulse:  66 77   Resp:   20   Temp: (!) 97.5 F (36.4 C)     TempSrc: Oral     SpO2:   93% 92%  Weight:      Height:        Intake/Output Summary (Last 24 hours) at 11/24/2018 1029 Last data filed at 11/24/2018 0617 Gross per 24 hour  Intake 529.18 ml  Output 1750 ml  Net -1220.82 ml   Filed Weights   11/22/18 1817 11/23/18 0208 11/24/18 0600  Weight: 58.3 kg 59.1 kg 60.7 kg    Examination:  General exam: no distress on 3 lit of  oxygen.  Respiratory system: diminished air entry, tachypnea Cardiovascular system: S1 & S2 heard, regular.  Gastrointestinal system: Abdomen is soft NT nd bs+ Central nervous system: Alert and oriented. No focal neurological deficits. Extremities: edema improving.  Skin: No rashes, lesions or ulcers Psychiatry: . Mood & affect appropriate.     Data Reviewed: I have personally reviewed following labs and imaging studies  CBC: Recent Labs  Lab 11/19/18 1311 11/20/18 0508 11/21/18 0557 11/22/18 0355  WBC 6.9 4.9 6.3 9.1  NEUTROABS 4.7  --   --   --   HGB 13.1 9.1* 9.1* 9.1*  HCT 39.7 28.4* 28.1* 28.2*  MCV 94.3 95.6 94.9 95.6  PLT 394 479* 609* 623*   Basic Metabolic Panel: Recent Labs  Lab 11/20/18 0508 11/21/18 0557 11/22/18 0355 11/23/18 0936 11/24/18 0514  NA 138 133* 132* 133* 137  K 3.9 3.7 3.8 3.4* 3.8  CL 99 95* 95* 94* 97*  CO2 28 28 26 30  33*  GLUCOSE 130* 158* 150* 248* 91  BUN 19 35* 44* 34* 29*  CREATININE 0.59 0.93 0.98 0.70 0.65  CALCIUM 8.5* 7.9* 7.7* 7.5* 7.8*  MG 2.0  --  2.4  --   --    GFR: Estimated Creatinine Clearance: 49.7 mL/min (by C-G formula based on SCr of 0.65 mg/dL). Liver Function Tests: Recent Labs  Lab 11/19/18 1311  AST 20  ALT 20  ALKPHOS 94  BILITOT 0.5  PROT 7.4  ALBUMIN 2.4*   No results for input(s): LIPASE, AMYLASE in the last 168 hours. No results for input(s): AMMONIA in the last 168 hours. Coagulation Profile: Recent Labs  Lab 11/20/18 1102 11/21/18 0557   INR 0.9 1.2   Cardiac Enzymes: Recent Labs  Lab 11/19/18 1317 11/20/18 1121 11/20/18 1645 11/20/18 2252  TROPONINI <0.03 <0.03 0.03* 0.04*   BNP (last 3 results) No results for input(s): PROBNP in the last 8760 hours. HbA1C: No results for input(s): HGBA1C in the last 72 hours. CBG: No results for input(s): GLUCAP in the last 168 hours. Lipid Profile: No results for input(s): CHOL, HDL, LDLCALC, TRIG, CHOLHDL, LDLDIRECT in the last 72 hours. Thyroid Function Tests: No results for input(s): TSH, T4TOTAL, FREET4, T3FREE, THYROIDAB in the last 72 hours. Anemia Panel: No results for input(s): VITAMINB12, FOLATE, FERRITIN, TIBC, IRON, RETICCTPCT in the last 72 hours. Sepsis Labs: Recent Labs  Lab 11/19/18 1309 11/19/18 2113 11/20/18 0508 11/21/18 0557  PROCALCITON  --  59.26 49.07 34.54  LATICACIDVEN 1.3  --   --   --     Recent Results (  from the past 240 hour(s))  Blood culture (routine x 2)     Status: None (Preliminary result)   Collection Time: 11/19/18 12:46 PM  Result Value Ref Range Status   Specimen Description   Final    BLOOD PORTA CATH RIGHT SIDE Performed at [facility] [address], [place], [place] [address]    Special Requests   Final    BOTTLES DRAWN AEROBIC AND ANAEROBIC Blood Culture adequate volume Performed at [facility] [address] [place], [place] [address]    Culture   Final    NO GROWTH 4 DAYS Performed at [facility], [place] [address] [place], [place] [address]    Report Status PENDING  Incomplete  Blood culture (routine x 2)     Status: None (Preliminary result)   Collection Time: 11/19/18 12:52 PM  Result Value Ref Range Status   Specimen Description   Final    BLOOD RIGHT ANTECUBITAL Performed at [facility] [address] [place], [place] [address]    Special Requests   Final    BOTTLES DRAWN AEROBIC AND ANAEROBIC Blood Culture adequate volume Performed at  [facility] [address], [place], [place] [address]    Culture   Final    NO GROWTH 4 DAYS Performed at [facility], [place] [address], [place], [place] [address]    Report Status PENDING  Incomplete  Respiratory Panel by PCR     Status: None   Collection Time: 11/19/18  9:13 PM  Result Value Ref Range Status   Adenovirus NOT DETECTED NOT DETECTED Final   Coronavirus 229E NOT DETECTED NOT DETECTED Final    Comment: (NOTE) The Coronavirus on the Respiratory Panel, DOES NOT test for the novel  Coronavirus (2019 nCoV)    Coronavirus HKU1 NOT DETECTED NOT DETECTED Final   Coronavirus NL63 NOT DETECTED NOT DETECTED Final   Coronavirus OC43 NOT DETECTED NOT DETECTED Final   Metapneumovirus NOT DETECTED NOT DETECTED Final   Rhinovirus / Enterovirus NOT DETECTED NOT DETECTED Final   Influenza A NOT DETECTED NOT DETECTED Final   Influenza B NOT DETECTED NOT DETECTED Final   Parainfluenza Virus 1 NOT DETECTED NOT DETECTED Final   Parainfluenza Virus 2 NOT DETECTED NOT DETECTED Final   Parainfluenza Virus 3 NOT DETECTED NOT DETECTED Final   Parainfluenza Virus 4 NOT DETECTED NOT DETECTED Final   Respiratory Syncytial Virus NOT DETECTED NOT DETECTED Final   Bordetella pertussis NOT DETECTED NOT DETECTED Final   Chlamydophila pneumoniae NOT DETECTED NOT DETECTED Final   Mycoplasma pneumoniae NOT DETECTED NOT DETECTED Final    Comment: Performed at [facility], [address], [place], [place] [address]  MRSA PCR Screening     Status: None   Collection Time: 11/19/18  9:13 PM  Result Value Ref Range Status   MRSA by PCR NEGATIVE NEGATIVE Final    Comment:        The GeneXpert MRSA Assay (FDA approved for NASAL specimens only), is one component of a comprehensive MRSA colonization surveillance program. It is not intended to diagnose MRSA infection nor to guide or monitor treatment for MRSA infections. Performed at [facility], [place] [address], [place],  [address]   Culture, Urine     Status: None   Collection Time: 11/20/18  8:00 AM  Result Value Ref Range Status   Specimen Description   Final    URINE,  RANDOM Performed at [facility], [place] [address], [place], [place] [address]    Special Requests   Final    NONE Performed at [facility], [place] [address] [place], [place] [address]    Culture   Final    NO GROWTH Performed at [facility], [place] [address], [place], [place] [address]    Report Status 11/21/2018 FINAL  Final  Culture, sputum-assessment     Status: None   Collection Time: 11/20/18 11:37 AM  Result Value Ref Range Status   Specimen Description SPUTUM  Final   Special Requests Immunocompromised  Final   Sputum evaluation   Final    THIS SPECIMEN IS ACCEPTABLE FOR SPUTUM CULTURE Performed at [facility], [place] [address] [place], [place] [address]    Report Status 11/20/2018 FINAL  Final  Culture, respiratory     Status: None   Collection Time: 11/20/18 11:37 AM  Result Value Ref Range Status   Specimen Description   Final    SPUTUM Performed at [facility] [address], [place], [place] [address]    Special Requests   Final    Immunocompromised Reflexed from [id number] Performed at [facility], [place] [address], [place], [place] [address]    Gram Stain   Final    ABUNDANT WBC PRESENT, PREDOMINANTLY PMN FEW GRAM POSITIVE COCCI FEW GRAM POSITIVE RODS    Culture   Final    FEW Consistent with normal respiratory flora. Performed at [facility], [place] [address], [place],  [address]    Report Status 11/22/2018 FINAL  Final         Radiology Studies: No results found.      Scheduled Meds: . ALPRAZolam  1 mg Oral Q2000  . apixaban  2.5 mg Oral BID  . atorvastatin  40 mg Oral Q2000  . azithromycin  500 mg Oral Q24H  . chlorhexidine  15 mL Mouth Rinse BID   . Chlorhexidine Gluconate Cloth  6 each Topical Daily  . feeding supplement (ENSURE ENLIVE)  237 mL Oral BID BM  . flecainide  50 mg Oral Q12H  . fluticasone  2 spray Each Nare Daily  . folic acid  1 mg Oral Daily  . furosemide  20 mg Oral BID  . gabapentin  100 mg Oral TID  . guaiFENesin  1,200 mg Oral BID  . ipratropium  2 puff Inhalation TID  . levalbuterol  2 puff Inhalation TID  . loratadine  10 mg Oral Daily  . mouth rinse  15 mL Mouth Rinse q12n4p  . metoprolol tartrate  75 mg Oral TID  . multivitamin with minerals  1 tablet Oral Daily  . pantoprazole  40 mg Oral Q0600  . predniSONE  40 mg Oral QAC breakfast  . sodium chloride flush  10-40 mL Intracatheter Q12H  . umeclidinium-vilanterol  1 puff Inhalation Daily   Continuous Infusions: . sodium chloride 10 mL/hr at 11/24/18 0011  . cefTRIAXone (ROCEPHIN)  IV Stopped (11/23/18 2252)  . propofol       LOS: 5 days    Time spent: 28 minutes.    Hosie Poisson, MD [facility] Pager [phone]  If 7PM-7AM, please contact night-coverage [email or web address] Password [facility] 11/24/2018, 10:29 AM

## 2018-11-24 NOTE — Progress Notes (Signed)
PT Cancellation Note  Patient Details Name: Donna Mcneil MRN: [id number] DOB: 30-Mar-1941   Cancelled Treatment:     PT attempted but deferred at request of pt 2* fatigue.  RN reports pt had been up to [facility] and sat in chair for a short period of time.  Will follow.   [name] 11/24/2018, 3:36 PM

## 2018-11-24 NOTE — Progress Notes (Signed)
Progress Note   Subjective   Transferred to ICU for severe SOB and AF with RVR.  Now back in sinus rhythm.  Comfortable.  Breathing is much better.  Inpatient Medications    Scheduled Meds: . ALPRAZolam  1 mg Oral Q2000  . apixaban  2.5 mg Oral BID  . atorvastatin  40 mg Oral Q2000  . azithromycin  500 mg Oral Q24H  . chlorhexidine  15 mL Mouth Rinse BID  . Chlorhexidine Gluconate Cloth  6 each Topical Daily  . feeding supplement (ENSURE ENLIVE)  237 mL Oral BID BM  . flecainide  50 mg Oral Q12H  . fluticasone  2 spray Each Nare Daily  . folic acid  1 mg Oral Daily  . furosemide  20 mg Oral BID  . gabapentin  100 mg Oral TID  . guaiFENesin  1,200 mg Oral BID  . ipratropium  2 puff Inhalation TID  . levalbuterol  2 puff Inhalation TID  . loratadine  10 mg Oral Daily  . mouth rinse  15 mL Mouth Rinse q12n4p  . metoprolol tartrate  75 mg Oral TID  . multivitamin with minerals  1 tablet Oral Daily  . pantoprazole  40 mg Oral Q0600  . predniSONE  40 mg Oral QAC breakfast  . sodium chloride flush  10-40 mL Intracatheter Q12H  . umeclidinium-vilanterol  1 puff Inhalation Daily   Continuous Infusions: . sodium chloride 10 mL/hr at 11/24/18 0011  . cefTRIAXone (ROCEPHIN)  IV Stopped (11/23/18 2252)  . propofol     PRN Meds: sodium chloride, acetaminophen **OR** [DISCONTINUED] acetaminophen, acetaminophen, guaiFENesin-dextromethorphan, hydrALAZINE, HYDROcodone-acetaminophen, levalbuterol, lip balm, metoprolol tartrate, ondansetron **OR** ondansetron (ZOFRAN) IV, polyethylene glycol, polyvinyl alcohol, prochlorperazine, sodium chloride flush   Vital Signs    Vitals:   11/24/18 0600 11/24/18 0817 11/24/18 0853 11/24/18 0854  BP:    (!) 178/87  Pulse:   66 77  Resp:    20  Temp:  (!) 97.5 F (36.4 C)    TempSrc:  Oral    SpO2:    93%  Weight: 60.7 kg     Height:        Intake/Output Summary (Last 24 hours) at 11/24/2018 4235 Last data filed at 11/24/2018 0617 Gross  per 24 hour  Intake 529.18 ml  Output 1750 ml  Net -1220.82 ml   Filed Weights   11/22/18 1817 11/23/18 0208 11/24/18 0600  Weight: 58.3 kg 59.1 kg 60.7 kg    Telemetry    afib has converted to sinus rhythm - Personally Reviewed  Physical Exam   GEN- The patient is ill appearing, alert  Head- normocephalic, atraumatic Eyes-  Sclera clear, conjunctiva pink Ears- hearing intact Oropharynx- clear Neck- supple, Lungs-   normal work of breathing Heart- Regular rate and rhythm  GI- soft, NT, ND, + BS Extremities- no clubbing, cyanosis, or edema  MS- age appropriate atrophy Skin- no rash or lesion Psych- euthymic mood, full affect Neuro- strength and sensation are intact   Labs    Chemistry Recent Labs  Lab 11/19/18 1311  11/22/18 0355 11/23/18 0936 11/24/18 0514  NA 136   < > 132* 133* 137  K 3.6   < > 3.8 3.4* 3.8  CL 98   < > 95* 94* 97*  CO2 27   < > 26 30 33*  GLUCOSE 121*   < > 150* 248* 91  BUN 19   < > 44* 34* 29*  CREATININE 0.57   < >  0.98 0.70 0.65  CALCIUM 9.3   < > 7.7* 7.5* 7.8*  PROT 7.4  --   --   --   --   ALBUMIN 2.4*  --   --   --   --   AST 20  --   --   --   --   ALT 20  --   --   --   --   ALKPHOS 94  --   --   --   --   BILITOT 0.5  --   --   --   --   GFRNONAA >60   < > 55* >60 >60  GFRAA >60   < > >60 >60 >60  ANIONGAP 11   < > 11 9 7    < > = values in this interval not displayed.     Hematology Recent Labs  Lab 11/20/18 0508 11/21/18 0557 11/22/18 0355  WBC 4.9 6.3 9.1  RBC 2.97* 2.96* 2.95*  HGB 9.1* 9.1* 9.1*  HCT 28.4* 28.1* 28.2*  MCV 95.6 94.9 95.6  MCH 30.6 30.7 30.8  MCHC 32.0 32.4 32.3  RDW 18.2* 18.4* 17.6*  PLT 479* 609* 619*    Cardiac Enzymes Recent Labs  Lab 11/19/18 1317 11/20/18 1121 11/20/18 1645 11/20/18 2252  TROPONINI <0.03 <0.03 0.03* 0.04*   No results for input(s): TROPIPOC in the last 168 hours.      Assessment & Plan    1.  Paroxysmal atrial fibrillation Likely due to underlying  medical illness Now back in sinus Will add flecainide 50mg  BID and stop IV diltiazem.   Would avoid IV digoxin Continue metoprolol Will defer eliquis to primary team  2. Edema secondary to acute on chronic diastolic dysfunction Improving with gentle diuresis  3. Stage IV nonsmall cell lung cA Per primary team.  4. Anemia of chronic disease Per primary team May limit anticoagulation long term  Thompson Grayer MD, [facility] 11/24/2018 9:06 AM

## 2018-11-24 NOTE — Progress Notes (Signed)
Palliative medicine consult received.  Attempted to meet with patient today, but she involved with staff and worn out after being transitioned to floor from CCU.   Will f/u tomorrow.  Micheline Rough, MD [place] Palliative Medicine Team [phone]  NO CHARGE NOTE

## 2018-11-24 NOTE — Progress Notes (Signed)
Arrived to room from ICU.  Alert and oriented.  O2 at 3l/[place].  Oriented to room/unit.  Voiced understanding.

## 2018-11-25 ENCOUNTER — Other Ambulatory Visit: Payer: Self-pay

## 2018-11-25 ENCOUNTER — Inpatient Hospital Stay (HOSPITAL_COMMUNITY): Payer: Medicare Other

## 2018-11-25 DIAGNOSIS — C3492 Malignant neoplasm of unspecified part of left bronchus or lung: Secondary | ICD-10-CM

## 2018-11-25 DIAGNOSIS — Z515 Encounter for palliative care: Secondary | ICD-10-CM

## 2018-11-25 DIAGNOSIS — Z7189 Other specified counseling: Secondary | ICD-10-CM

## 2018-11-25 LAB — TROPONIN I: Troponin I: 0.03 ng/mL (ref <0.03)

## 2018-11-25 MED ORDER — MORPHINE SULFATE (PF) 2 MG/ML IV SOLN
0.5000 mg | INTRAVENOUS | Status: DC | PRN
  Administered 2018-11-25: 0.5 mg via INTRAVENOUS
  Filled 2018-11-25: qty 1

## 2018-11-25 NOTE — Progress Notes (Signed)
Patient complaining of difficulty breathing and a "heaviness" in her chest. EKG obtained, and a new set of vital signs obtained as well.   MD called about patient symptoms. MD stated she would look at EKG and call back. MD stated she is fine with the EKG, but wants to give a small dose of morphine to see if this helps relieve symptoms.   Patient asked if she could continue using the partial re-breather "for right now until I can breathe a little easier"   RN will give morphine as ordered and continue to monitor.

## 2018-11-25 NOTE — Progress Notes (Signed)
Occupational Therapy Treatment Patient Details Name: OTHA RICKLES MRN: [id number] DOB: 1941/06/26 Today's Date: 11/25/2018    History of present illness 78 yo female admitted to ED on 3/23 with shortness of breath, on home O2. Pt with stage IV lung cancer. PMH includes COPD exacerbation, diverticulitis, HTN, lupus, OP, ARF with hypoxia, malignant pleural effusion, RA, peripheral neuropathy, anxiety, broncitis, loop sigmoid colectomy.    OT comments    Follow Up Recommendations  SNF    Equipment Recommendations  None recommended by OT    Recommendations for Other Services      Precautions / Restrictions Precautions Precautions: Fall Precaution Comments: check oxygen saturations       Mobility Bed Mobility Overal bed mobility: Needs Assistance Bed Mobility: Supine to Sit     Supine to sit: Mod assist        Transfers Overall transfer level: Needs assistance Equipment used: Rolling walker (2 wheeled) Transfers: Sit to/from [facility] Sit to Stand: Mod assist Stand pivot transfers: Mod assist            Balance Overall balance assessment: Needs assistance;History of Falls(fall during hospitalization per pt) Sitting-balance support: Feet unsupported;Single extremity supported Sitting balance-Leahy Scale: Fair     Standing balance support: Bilateral upper extremity supported Standing balance-Leahy Scale: Poor(NT)                             ADL either performed or assessed with clinical judgement   ADL Overall ADL's : Needs assistance/impaired Eating/Feeding: Set up;Sitting       Upper Body Bathing: Set up;Sitting               Toilet Transfer: Moderate assistance;RW;Stand-pivot;Cueing for sequencing;Cueing for safety Toilet Transfer Details (indicate cue type and reason): bed to chair     Tub/ Shower Transfer: Moderate assistance;Cueing for safety;Cueing for sequencing     General ADL Comments: pt able to  tolerate increased activity this day     Vision Patient Visual Report: No change from baseline     Perception     Praxis      Cognition Arousal/Alertness: Awake/alert Behavior During Therapy: WFL for tasks assessed/performed Overall Cognitive Status: Within Functional Limits for tasks assessed                                          Exercises     Shoulder Instructions       General Comments      Pertinent Vitals/ Pain          Home Living                                          Prior Functioning/Environment              Frequency  Min 2X/week        Progress Toward Goals  OT Goals(current goals can now be found in the care plan section)  Progress towards OT goals: Progressing toward goals     Plan Discharge plan remains appropriate    Co-evaluation                 AM-PAC OT "6 Clicks" Daily Activity     Outcome Measure   Help from  another person eating meals?: A Little Help from another person taking care of personal grooming?: A Little Help from another person toileting, which includes using toliet, bedpan, or urinal?: A Lot Help from another person bathing (including washing, rinsing, drying)?: A Lot Help from another person to put on and taking off regular upper body clothing?: A Lot Help from another person to put on and taking off regular lower body clothing?: A Lot 6 Click Score: 14    End of Session    OT Visit Diagnosis: Unsteadiness on feet (R26.81);Muscle weakness (generalized) (M62.81);Other abnormalities of gait and mobility (R26.89);History of falling (Z91.81)   Activity Tolerance Patient tolerated treatment well   Patient Left in chair;with chair alarm set   Nurse Communication Mobility status        Time: 1050-1109 OT Time Calculation (min): 19 min  Charges: OT General Charges $OT Visit: 1 Visit OT Treatments $Self Care/Home Management : 8-22 mins  Kari Baars,  [place] [phone] Office- [place], Edwena Felty D 11/25/2018, 1:10 PM

## 2018-11-25 NOTE — Progress Notes (Signed)
CRITICAL VALUE ALERT  Critical Value:  Troponin 0.03   Date & Time Notied:  11/25/2018 2151   Provider Notified: [place]   Orders Received/Actions taken:

## 2018-11-25 NOTE — Progress Notes (Signed)
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  Patient with a prior tobacco history and quit in January 2020 after her diagnosis of lung cancer.  Patient also presented with increased lower extremity edema with associated nausea.  CT angiogram chest was negative for PE however noticed advanced left lung malignancy, pleural effusion and new extensive bilateral peripheral nodular and heterogeneous airspace opacity and new enlarged paratracheal lymph nodes consistent with infection versus edema.  ID, Dr. Johnnye Sima was curbside by nurse practitioner Forrest Moron the evening of 11/19/2018 and it was felt testing for COVID 19 was not warranted at this time.  Overnight pt was transferred back to step down as she went into afib with RVR.  Palliative care consulted for goals of care.    Assessment & Plan:   Principal Problem:   Acute respiratory failure with hypoxia (HCC) Active Problems:   Seropositive rheumatoid arthritis (HCC)   Hyperlipidemia   Hypertension   Cutaneous lupus erythematosus   COPD (chronic obstructive pulmonary disease) (HCC)   Chronic respiratory failure with hypoxia (HCC)   Hyponatremia   Malignant pleural effusion   Adenocarcinoma of left lung, stage 4 (HCC)   Port-A-Cath in place   Pressure injury of skin   Tachycardia   Acute on chronic diastolic CHF (congestive heart failure) (HCC)   Atrial fibrillation with RVR (HCC)  Acute on chronic respiratory failure with hypoxia secondary to acute COPD exacerbation and acute on chronic diastolic heart failure. Patient currently in stepdown , trying to  wean her off the oxygen.  Respiratory panel is negative influenza PCR is negative pro calcitonin level still elevated.   Urine strep pneumo antigen and legionella antigen is negative. Afebrile overnight, WBC count within normal limits. Start tapering steroids as her wheezing has improved and we will try to wean her oxygen as appropraite.  Continue with Rocephin and Zithromax for questionable atypical infection. Continue with Brovana and Pulmicort. Repeat CXR today shows improvement in the pneumonia.    Atrial fibrillation with RVR Rate better today  And in sinus rhythm with  oral metoprolol.  D/c IV cardizem and IV digoxin. She was started on flecainide by cardiology.  Thyroid panel within normal limits.    Anemia of chronic disease Hemoglobin stable around 9.  Continue to monitor and transfuse to keep hemoglobin greater than 7.    Acute on chronic bilateral lower extremity edema probably secondary to mild acute on chronic diastolic heart failure. Improving with IV Lasix. Change to oral.  Continue with strict intake and output.  Fluid restriction and daily weights.     Stage IV non-small cell lung cancer Patient follows up with Dr. Julien Nordmann.   History of SLE and rheumatoid arthritis Patient was on methotrexate 15 mg, which is on hold for now    History of tobacco use disorder Patient recently quit smoking in January 2020.   Hypertension Well controlled resume with Norvasc and Lasix   Hyperlipidemia resume statin      DVT prophylaxis: Eliquis Code Status: DNR Family Communication: None at bedside Disposition Plan: Pending clinical improvement. Possible transfer to the floor if able to wean her oxygen appropriate for the  floor.    Consultants:   Cardiology  Infectious disease  Procedures: None  Antimicrobials: Rocephin and Zithromax   Subjective: Sob still persistent.   Objective: Vitals:   11/24/18 2013 11/25/18 0442 11/25/18 0500 11/25/18 0757   BP: (!) 149/59 (!) 174/63    Pulse: (!) 54 (!) 58    Resp: 20 (!) 22    Temp: 97.8 F (36.6 C) 98.2 F (36.8 C)    TempSrc: Oral Oral    SpO2: 100% 98%  97%  Weight:   60.5 kg   Height:        Intake/Output Summary (Last 24 hours) at 11/25/2018 1108 Last data filed at 11/25/2018 0300 Gross per 24 hour  Intake 202.2 ml  Output -  Net 202.2 ml   Filed Weights   11/23/18 0208 11/24/18 0600 11/25/18 0500  Weight: 59.1 kg 60.7 kg 60.5 kg    Examination:  General exam: no distress on 3 lit of [place] oxygen.  Respiratory system: diminished air entry, tachypnea Cardiovascular system: S1 & S2 heard, regular.  Gastrointestinal system: Abdomen is soft NT nd bs+ Central nervous system: Alert and oriented. No focal neurological deficits. Extremities: edema improving.  Skin: No rashes, lesions or ulcers Psychiatry: . Mood & affect appropriate.     Data Reviewed: I have personally reviewed following labs and imaging studies  CBC: Recent Labs  Lab 11/19/18 1311 11/20/18 0508 11/21/18 0557 11/22/18 0355  WBC 6.9 4.9 6.3 9.1  NEUTROABS 4.7  --   --   --   HGB 13.1 9.1* 9.1* 9.1*  HCT 39.7 28.4* 28.1* 28.2*  MCV 94.3 95.6 94.9 95.6  PLT 394 479* 609* 353*   Basic Metabolic Panel: Recent Labs  Lab 11/20/18 0508 11/21/18 0557 11/22/18 0355 11/23/18 0936 11/24/18 0514  NA 138 133* 132* 133* 137  K 3.9 3.7 3.8 3.4* 3.8  CL 99 95* 95* 94* 97*  CO2 28 28 26 30  33*  GLUCOSE 130* 158* 150* 248* 91  BUN 19 35* 44* 34* 29*  CREATININE 0.59 0.93 0.98 0.70 0.65  CALCIUM 8.5* 7.9* 7.7* 7.5* 7.8*  MG 2.0  --  2.4  --   --    GFR: Estimated Creatinine Clearance: 49.7 mL/min (by C-G formula based on SCr of 0.65 mg/dL). Liver Function Tests: Recent Labs  Lab 11/19/18 1311  AST 20  ALT 20  ALKPHOS 94  BILITOT 0.5  PROT 7.4  ALBUMIN 2.4*   No results for input(s): LIPASE, AMYLASE in the last 168 hours. No results for input(s): AMMONIA in the last 168 hours. Coagulation  Profile: Recent Labs  Lab 11/20/18 1102 11/21/18 0557  INR 0.9 1.2   Cardiac Enzymes: Recent Labs  Lab 11/19/18 1317 11/20/18 1121 11/20/18 1645 11/20/18 2252  TROPONINI <0.03 <0.03 0.03* 0.04*   BNP (last 3 results) No results for input(s): PROBNP in the last 8760 hours. HbA1C: No results for input(s): HGBA1C in the last 72 hours. CBG: No results for input(s): GLUCAP in the last 168 hours. Lipid Profile: No results for input(s): CHOL, HDL, LDLCALC, TRIG, CHOLHDL, LDLDIRECT in the last 72 hours. Thyroid Function Tests: No results for input(s): TSH, T4TOTAL, FREET4, T3FREE, THYROIDAB in the last 72 hours. Anemia Panel: No results for input(s): VITAMINB12, FOLATE, FERRITIN, TIBC, IRON, RETICCTPCT in the last 72 hours. Sepsis Labs: Recent Labs  Lab 11/19/18 1309 11/19/18 2113 11/20/18 0508 11/21/18 0557  PROCALCITON  --  59.26 49.07 34.54  LATICACIDVEN 1.3  --   --   --  Recent Results (from the past 240 hour(s))  Blood culture (routine x 2)     Status: None   Collection Time: 11/19/18 12:46 PM  Result Value Ref Range Status   Specimen Description   Final    BLOOD PORTA CATH RIGHT SIDE Performed at [facility] [address], [place], [place] [address]    Special Requests   Final    BOTTLES DRAWN AEROBIC AND ANAEROBIC Blood Culture adequate volume Performed at [facility] [address], [place], [place] [address]    Culture   Final    NO GROWTH 5 DAYS Performed at [facility], [place] [address], [place], [place] [address]    Report Status 11/24/2018 FINAL  Final  Blood culture (routine x 2)     Status: None   Collection Time: 11/19/18 12:52 PM  Result Value Ref Range Status   Specimen Description   Final    BLOOD RIGHT ANTECUBITAL Performed at [facility] [address] [place], [place] [address]    Special Requests   Final    BOTTLES DRAWN AEROBIC AND ANAEROBIC Blood Culture adequate  volume Performed at [facility] [address] [place], [place] [address]    Culture   Final    NO GROWTH 5 DAYS Performed at [facility], [place] [address], [place], [place] [address]    Report Status 11/24/2018 FINAL  Final  Respiratory Panel by PCR     Status: None   Collection Time: 11/19/18  9:13 PM  Result Value Ref Range Status   Adenovirus NOT DETECTED NOT DETECTED Final   Coronavirus 229E NOT DETECTED NOT DETECTED Final    Comment: (NOTE) The Coronavirus on the Respiratory Panel, DOES NOT test for the novel  Coronavirus (2019 nCoV)    Coronavirus HKU1 NOT DETECTED NOT DETECTED Final   Coronavirus NL63 NOT DETECTED NOT DETECTED Final   Coronavirus OC43 NOT DETECTED NOT DETECTED Final   Metapneumovirus NOT DETECTED NOT DETECTED Final   Rhinovirus / Enterovirus NOT DETECTED NOT DETECTED Final   Influenza A NOT DETECTED NOT DETECTED Final   Influenza B NOT DETECTED NOT DETECTED Final   Parainfluenza Virus 1 NOT DETECTED NOT DETECTED Final   Parainfluenza Virus 2 NOT DETECTED NOT DETECTED Final   Parainfluenza Virus 3 NOT DETECTED NOT DETECTED Final   Parainfluenza Virus 4 NOT DETECTED NOT DETECTED Final   Respiratory Syncytial Virus NOT DETECTED NOT DETECTED Final   Bordetella pertussis NOT DETECTED NOT DETECTED Final   Chlamydophila pneumoniae NOT DETECTED NOT DETECTED Final   Mycoplasma pneumoniae NOT DETECTED NOT DETECTED Final    Comment: Performed at [facility], [place]. [address] [place], [place] [address]  MRSA PCR Screening     Status: None   Collection Time: 11/19/18  9:13 PM  Result Value Ref Range Status   MRSA by PCR NEGATIVE NEGATIVE Final    Comment:        The GeneXpert MRSA Assay (FDA approved for NASAL specimens only), is one component of a comprehensive MRSA colonization surveillance program. It is not intended to diagnose MRSA infection nor to guide or monitor treatment for MRSA infections. Performed at [facility], [place] [address], [place], [place] [address]   Culture, Urine     Status: None   Collection Time: 11/20/18  8:00 AM  Result Value Ref Range Status   Specimen Description   Final    URINE,  RANDOM Performed at [facility], [place] [address], [place], [place] [address]    Special Requests   Final    NONE Performed at [facility], [place] [address] [place], [place] [address]    Culture   Final    NO GROWTH Performed at [facility], [place] [address], [place], [place] [address]    Report Status 11/21/2018 FINAL  Final  Culture, sputum-assessment     Status: None   Collection Time: 11/20/18 11:37 AM  Result Value Ref Range Status   Specimen Description SPUTUM  Final   Special Requests Immunocompromised  Final   Sputum evaluation   Final    THIS SPECIMEN IS ACCEPTABLE FOR SPUTUM CULTURE Performed at [facility], [place] [address], [place], [place] [address]    Report Status 11/20/2018 FINAL  Final  Culture, respiratory     Status: None   Collection Time: 11/20/18 11:37 AM  Result Value Ref Range Status   Specimen Description   Final    SPUTUM Performed at [facility] [address], [place], [place] [address]    Special Requests   Final    Immunocompromised Reflexed from [id number] Performed at [facility], [place] [address], [place], [place] [address]    Gram Stain   Final    ABUNDANT WBC PRESENT, PREDOMINANTLY PMN FEW GRAM POSITIVE COCCI FEW GRAM POSITIVE RODS    Culture   Final    FEW Consistent with normal respiratory flora. Performed at [facility], [place] [address], [place], [place] [address]    Report Status 11/22/2018 FINAL  Final         Radiology Studies: Dg Chest Port 1 View  Result Date: 11/25/2018 CLINICAL DATA:  Stage IV non-small-cell lung cancer. Shortness of breath. EXAM: PORTABLE CHEST 1 VIEW COMPARISON:  11/21/2018 FINDINGS: Right  Port-A-Cath tip at low SVC. Midline trachea. Mild cardiomegaly. Atherosclerosis in the transverse aorta. Small bilateral pleural effusions are similar. No pneumothorax. Biapical pleural thickening. Similar interstitial thickening. Hyperinflation. Left greater than right base airspace disease is not significantly changed. Slight improvement in right upper lobe airspace disease. IMPRESSION: Improved right upper lobe airspace disease, possibly decreased infection. Otherwise, similar appearance of the chest with mild pulmonary venous congestion, small bilateral pleural effusions, and bibasilar airspace disease. Aortic Atherosclerosis (ICD10-I70.0). Electronically Signed   By: Abigail Miyamoto M.D.   On: 11/25/2018 10:31        Scheduled Meds: . ALPRAZolam  1 mg Oral Q2000  . apixaban  2.5 mg Oral BID  . atorvastatin  40 mg Oral Q2000  . azithromycin  500 mg Oral Q24H  . chlorhexidine  15 mL Mouth Rinse BID  . Chlorhexidine Gluconate Cloth  6 each Topical Daily  . feeding supplement (ENSURE ENLIVE)  237 mL Oral BID BM  . flecainide  50 mg Oral Q12H  . fluticasone  2 spray Each Nare Daily  . folic acid  1 mg Oral Daily  . furosemide  20 mg Oral BID  . gabapentin  100 mg Oral TID  . guaiFENesin  1,200 mg Oral BID  . ipratropium  2 puff Inhalation TID  . levalbuterol  2 puff Inhalation TID  . loratadine  10 mg Oral Daily  . mouth rinse  15 mL Mouth Rinse q12n4p  . metoprolol tartrate  75 mg Oral TID  . multivitamin with minerals  1 tablet Oral Daily  . pantoprazole  40 mg Oral  [id number]  . predniSONE  40 mg Oral QAC breakfast  . sodium chloride flush  10-40 mL Intracatheter Q12H  . umeclidinium-vilanterol  1 puff Inhalation Daily   Continuous Infusions: . sodium chloride 10 mL/hr at 11/24/18 0011  . cefTRIAXone (ROCEPHIN)  IV 1 g (11/24/18 2241)     LOS: 6 days    Time spent: 28 minutes.    Hosie Poisson, MD [facility] Pager [phone]  If 7PM-7AM, please contact night-coverage  [email or web address] Password [email or web address] 11/25/2018, 11:08 AM

## 2018-11-25 NOTE — Progress Notes (Signed)
Progress Note   Subjective   Doing well today, the patient denies CP.  SOB is stable.  No further AF since starting flecainide.  No new concerns  Inpatient Medications    Scheduled Meds: . ALPRAZolam  1 mg Oral Q2000  . apixaban  2.5 mg Oral BID  . atorvastatin  40 mg Oral Q2000  . azithromycin  500 mg Oral Q24H  . chlorhexidine  15 mL Mouth Rinse BID  . Chlorhexidine Gluconate Cloth  6 each Topical Daily  . feeding supplement (ENSURE ENLIVE)  237 mL Oral BID BM  . flecainide  50 mg Oral Q12H  . fluticasone  2 spray Each Nare Daily  . folic acid  1 mg Oral Daily  . furosemide  20 mg Oral BID  . gabapentin  100 mg Oral TID  . guaiFENesin  1,200 mg Oral BID  . ipratropium  2 puff Inhalation TID  . levalbuterol  2 puff Inhalation TID  . loratadine  10 mg Oral Daily  . mouth rinse  15 mL Mouth Rinse q12n4p  . metoprolol tartrate  75 mg Oral TID  . multivitamin with minerals  1 tablet Oral Daily  . pantoprazole  40 mg Oral Q0600  . predniSONE  40 mg Oral QAC breakfast  . sodium chloride flush  10-40 mL Intracatheter Q12H  . umeclidinium-vilanterol  1 puff Inhalation Daily   Continuous Infusions: . sodium chloride 10 mL/hr at 11/24/18 0011  . cefTRIAXone (ROCEPHIN)  IV 1 g (11/24/18 2241)   PRN Meds: sodium chloride, acetaminophen **OR** [DISCONTINUED] acetaminophen, acetaminophen, guaiFENesin-dextromethorphan, hydrALAZINE, HYDROcodone-acetaminophen, levalbuterol, lip balm, metoprolol tartrate, ondansetron **OR** ondansetron (ZOFRAN) IV, polyethylene glycol, polyvinyl alcohol, prochlorperazine, sodium chloride flush   Vital Signs    Vitals:   11/24/18 2013 11/25/18 0442 11/25/18 0500 11/25/18 0757  BP: (!) 149/59 (!) 174/63    Pulse: (!) 54 (!) 58    Resp: 20 (!) 22    Temp: 97.8 F (36.6 C) 98.2 F (36.8 C)    TempSrc: Oral Oral    SpO2: 100% 98%  97%  Weight:   60.5 kg   Height:        Intake/Output Summary (Last 24 hours) at 11/25/2018 0840 Last data filed  at 11/25/2018 0300 Gross per 24 hour  Intake 202.2 ml  Output 300 ml  Net -97.8 ml   Filed Weights   11/23/18 0208 11/24/18 0600 11/25/18 0500  Weight: 59.1 kg 60.7 kg 60.5 kg    Telemetry    Sinus rhythm with PVCs - Personally Reviewed  Physical Exam   GEN- The patient is ill appearing, alert and oriented x 3 today.   Head- normocephalic, atraumatic Eyes-  Sclera clear, conjunctiva pink Ears- hearing intact Oropharynx- clear Neck- supple, Lungs- tachypneic with O2 required Heart- Regular rate and rhythm  GI- soft, NT, ND, + BS Extremities- no clubbing, cyanosis, or edema  MS- age appropriate atrophy Skin- no rash or lesion Psych- euthymic mood, full affect Neuro- strength and sensation are intact   Labs    Chemistry Recent Labs  Lab 11/19/18 1311  11/22/18 0355 11/23/18 0936 11/24/18 0514  NA 136   < > 132* 133* 137  K 3.6   < > 3.8 3.4* 3.8  CL 98   < > 95* 94* 97*  CO2 27   < > 26 30 33*  GLUCOSE 121*   < > 150* 248* 91  BUN 19   < > 44* 34* 29*  CREATININE 0.57   < >  0.98 0.70 0.65  CALCIUM 9.3   < > 7.7* 7.5* 7.8*  PROT 7.4  --   --   --   --   ALBUMIN 2.4*  --   --   --   --   AST 20  --   --   --   --   ALT 20  --   --   --   --   ALKPHOS 94  --   --   --   --   BILITOT 0.5  --   --   --   --   GFRNONAA >60   < > 55* >60 >60  GFRAA >60   < > >60 >60 >60  ANIONGAP 11   < > 11 9 7    < > = values in this interval not displayed.     Hematology Recent Labs  Lab 11/20/18 0508 11/21/18 0557 11/22/18 0355  WBC 4.9 6.3 9.1  RBC 2.97* 2.96* 2.95*  HGB 9.1* 9.1* 9.1*  HCT 28.4* 28.1* 28.2*  MCV 95.6 94.9 95.6  MCH 30.6 30.7 30.8  MCHC 32.0 32.4 32.3  RDW 18.2* 18.4* 17.6*  PLT 479* 609* 619*    Cardiac Enzymes Recent Labs  Lab 11/19/18 1317 11/20/18 1121 11/20/18 1645 11/20/18 2252  TROPONINI <0.03 <0.03 0.03* 0.04*   No results for input(s): TROPIPOC in the last 168 hours.      Assessment & Plan    1.  Paroxysmal afib Maintaining  sinus rhythm with flecainide Obtain ekg tomorrow am given new flecainide start On eliquis as per primary team, hemaglobin appears stable  2. Acute on chronic diastolic dysfunction Diuresis as able  3. Stage IV NSCLCa Palliative discussions pending  4. Anemia stable  Thompson Grayer MD, [facility] 11/25/2018 8:40 AM

## 2018-11-25 NOTE — Progress Notes (Signed)
Physical Therapy Treatment Patient Details Name: Donna Mcneil MRN: [id number] DOB: [birth year] Today's Date: 11/25/2018    History of Present Illness 78 yo female admitted to ED on 3/23 with shortness of breath, on home O2. Pt with stage IV lung cancer. PMH includes COPD exacerbation, diverticulitis, HTN, lupus, OP, ARF with hypoxia, malignant pleural effusion, RA, peripheral neuropathy, anxiety, broncitis, loop sigmoid colectomy.     PT Comments    Pt very cooperative but requiring increased time and multiple rest breaks 2* SOB for task completion..  Follow Up Recommendations  SNF;Supervision/Assistance - 24 hour     Equipment Recommendations       Recommendations for Other Services       Precautions / Restrictions Precautions Precautions: Fall Precaution Comments: check oxygen saturations Restrictions Weight Bearing Restrictions: No    Mobility  Bed Mobility Overal bed mobility: Needs Assistance Bed Mobility: Sit to Supine     Supine to sit: Mod assist Sit to supine: Min assist   General bed mobility comments: min assist to manage LEs  Transfers Overall transfer level: Needs assistance Equipment used: Rolling walker (2 wheeled) Transfers: Sit to/from [facility] Sit to Stand: Min assist;Mod assist Stand pivot transfers: Min assist;Mod assist       General transfer comment: cues for use of UEs to self assist with move to standing.  Stand/pvt chair to [facility]  Ambulation/Gait Ambulation/Gait assistance: Min assist;Mod assist Gait Distance (Feet): 2 Feet Assistive device: 2 person hand held assist Gait Pattern/deviations: Step-to pattern;Decreased step length - right;Decreased step length - left;Shuffle;Trunk flexed Gait velocity: decr   General Gait Details: pt stood from commode and stepped back to beside   Chief Strategy Officer    Modified Rankin (Stroke Patients Only)       Balance Overall balance  assessment: Needs assistance;History of Falls(fall during hospitalization per pt) Sitting-balance support: Feet unsupported;Single extremity supported Sitting balance-Leahy Scale: Fair     Standing balance support: Bilateral upper extremity supported Standing balance-Leahy Scale: Poor(NT)                              Cognition Arousal/Alertness: Awake/alert Behavior During Therapy: WFL for tasks assessed/performed Overall Cognitive Status: Within Functional Limits for tasks assessed                                        Exercises      General Comments        Pertinent Vitals/Pain Pain Assessment: No/denies pain    Home Living                      Prior Function            PT Goals (current goals can now be found in the care plan section) Acute Rehab PT Goals Patient Stated Goal: breathe better PT Goal Formulation: With patient Time For Goal Achievement: 12/05/18 Potential to Achieve Goals: Good Progress towards PT goals: Progressing toward goals    Frequency    Min 2X/week      PT Plan Current plan remains appropriate    Co-evaluation              AM-PAC PT "6 Clicks" Mobility   Outcome Measure  Help needed turning from your back to  your side while in a flat bed without using bedrails?: A Little Help needed moving from lying on your back to sitting on the side of a flat bed without using bedrails?: A Lot Help needed moving to and from a bed to a chair (including a wheelchair)?: A Lot Help needed standing up from a chair using your arms (e.g., wheelchair or bedside chair)?: A Lot Help needed to walk in hospital room?: A Lot Help needed climbing 3-5 steps with a railing? : Total 6 Click Score: 12    End of Session Equipment Utilized During Treatment: Oxygen Activity Tolerance: Patient limited by fatigue Patient left: in bed;with bed alarm set;with call bell/phone within reach Nurse Communication: Mobility  status PT Visit Diagnosis: Muscle weakness (generalized) (M62.81);Other abnormalities of gait and mobility (R26.89)     Time: 7014-1030 PT Time Calculation (min) (ACUTE ONLY): 28 min  Charges:  $Gait Training: 8-22 mins $Therapeutic Activity: 8-22 mins                     [place] Pager [phone] Office [phone]    Tanner Vigna 11/25/2018, 1:15 PM

## 2018-11-26 LAB — TROPONIN I: Troponin I: 0.03 ng/mL (ref <0.03)

## 2018-11-26 MED ORDER — METOPROLOL TARTRATE 100 MG PO TABS: 100.0000 mg | ORAL_TABLET | Freq: Two times a day (BID) | ORAL | 0 refills | Status: DC

## 2018-11-26 MED ORDER — FLUTICASONE PROPIONATE 50 MCG/ACT NA SUSP: 2.0000 | Freq: Every day | NASAL | 2 refills | Status: AC

## 2018-11-26 MED ORDER — IPRATROPIUM BROMIDE HFA 17 MCG/ACT IN AERS: 2.0000 | INHALATION_SPRAY | Freq: Three times a day (TID) | RESPIRATORY_TRACT | 12 refills | Status: AC

## 2018-11-26 MED ORDER — POLYVINYL ALCOHOL 1.4 % OP SOLN: 1.0000 [drp] | Freq: Every day | OPHTHALMIC | 0 refills | Status: DC | PRN

## 2018-11-26 MED ORDER — FLECAINIDE ACETATE 50 MG PO TABS: 50.0000 mg | ORAL_TABLET | Freq: Two times a day (BID) | ORAL | 0 refills | Status: DC

## 2018-11-26 MED ORDER — LORATADINE 10 MG PO TABS: 10.0000 mg | ORAL_TABLET | Freq: Every day | ORAL | Status: DC

## 2018-11-26 MED ORDER — POLYETHYLENE GLYCOL 3350 17 G PO PACK: 17.0000 g | PACK | Freq: Every day | ORAL | 0 refills | Status: AC | PRN

## 2018-11-26 MED ORDER — APIXABAN 2.5 MG PO TABS: 2.5000 mg | ORAL_TABLET | Freq: Two times a day (BID) | ORAL | 0 refills | Status: DC

## 2018-11-26 MED ORDER — METHOTREXATE 2.5 MG PO TABS: 15.0000 mg | ORAL_TABLET | ORAL | 0 refills | Status: AC

## 2018-11-26 MED ORDER — PANTOPRAZOLE SODIUM 40 MG PO TBEC: 40.0000 mg | DELAYED_RELEASE_TABLET | Freq: Every day | ORAL | 0 refills | Status: DC

## 2018-11-26 MED ORDER — METOPROLOL TARTRATE 50 MG PO TABS
100.0000 mg | ORAL_TABLET | Freq: Two times a day (BID) | ORAL | Status: DC
  Administered 2018-11-26 – 2018-12-01 (×8): 100 mg via ORAL
  Filled 2018-11-26 (×10): qty 2

## 2018-11-26 MED ORDER — ENSURE ENLIVE PO LIQD: 237.0000 mL | Freq: Two times a day (BID) | ORAL | 12 refills | Status: AC

## 2018-11-26 MED ORDER — FUROSEMIDE 20 MG PO TABS: 20.0000 mg | ORAL_TABLET | Freq: Two times a day (BID) | ORAL | 0 refills | Status: DC

## 2018-11-26 NOTE — NC FL2 (Signed)
[place] LEVEL OF CARE SCREENING TOOL     IDENTIFICATION  Patient Name: Donna Mcneil Birthdate: 10/27/1940 Sex: female Admission Date (Current Location): 11/19/2018  [facility] and [place] Number:  Herbalist and Address:  [facility],  [place] [address] [place]      Provider Number: [id number]  Attending Physician Name and Address:  Hosie Poisson, MD  Relative Name and Phone Number:       Current Level of Care: Hospital Recommended Level of Care: [place] Prior Approval Number:    Date Approved/Denied:   PASRR Number:    Discharge Plan: SNF    Current Diagnoses: Patient Active Problem List   Diagnosis Date Noted  . Pressure injury of skin 11/20/2018  . Tachycardia   . Acute on chronic diastolic CHF (congestive heart failure) ([place])   . Atrial fibrillation with RVR ([place])   . Acute respiratory failure with hypoxia ([place]) 11/19/2018  . Port-A-Cath in place 10/24/2018  . Adenocarcinoma of left lung, stage 4 ([place]) 09/26/2018  . Goals of care, counseling/discussion 09/26/2018  . Encounter for antineoplastic chemotherapy 09/26/2018  . Community acquired pneumonia of left lower lobe of lung ([place])   . Mass of upper lobe of left lung 09/02/2018  . COPD (chronic obstructive pulmonary disease) ([place]) 09/01/2018  . Chronic respiratory failure with hypoxia ([place]) 09/01/2018  . Hyponatremia 09/01/2018  . 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  . 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 Rheumatoid arthritis involving multiple sites with positive rheumatoid factor ([place]) 12/12/2017  . Tobacco use 04/14/2017  . Carotid stenosis 04/14/2017  . Peripheral neuropathy 06/24/2016  . 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 Chronic bronchitis ([place]) 11/13/2015  . Osteoarthritis 10/05/2015  . Hyperlipidemia 10/05/2015  . Hypertension 10/05/2015  . Osteoporosis 10/05/2015  . Acid reflux 10/05/2015  . Peptic ulcer 10/05/2015   . Cataract 10/05/2015  . Tendinitis 10/05/2015  . Osteopenia 02/11/2014  . Seropositive rheumatoid arthritis ([place]) 02/11/2014    Orientation RESPIRATION BLADDER Height & Weight     Self, Time, Situation, Place  O2 Continent Weight: 61.2 kg Height:  5\' 2"  (157.5 cm)  BEHAVIORAL SYMPTOMS/MOOD NEUROLOGICAL BOWEL NUTRITION STATUS      Continent Diet  AMBULATORY STATUS COMMUNICATION OF NEEDS Skin   Extensive Assist Verbally Normal                       Personal Care Assistance Level of Assistance  Bathing, Feeding, Dressing Bathing Assistance: Maximum assistance Feeding assistance: Limited assistance Dressing Assistance: Maximum assistance     Functional Limitations Info             SPECIAL CARE FACTORS FREQUENCY  PT (By licensed PT), OT (By licensed OT)     PT Frequency: 5 times a week OT Frequency: 5 times a week            Contractures Contractures Info: Not present    Additional Factors Info  Code Status Code Status Info: DNR             Current Medications (11/26/2018):  This is the current hospital active medication list Current Facility-Administered Medications  Medication Dose Route Frequency Provider Last Rate Last Dose  . 0.9 %  sodium chloride infusion   Intravenous PRN Hosie Poisson, MD 10 mL/hr at 11/24/18 0011    . acetaminophen (TYLENOL) tablet 650 mg  650 mg Oral Q6H PRN Hosie Poisson, MD      . acetaminophen (TYLENOL) tablet  650 mg  650 mg Oral Q4H PRN Hosie Poisson, MD      . ALPRAZolam Duanne Moron) tablet 1 mg  1 mg Oral Q2000 Hosie Poisson, MD   1 mg at 11/25/18 2026  . apixaban (ELIQUIS) tablet 2.5 mg  2.5 mg Oral BID Hosie Poisson, MD   2.5 mg at 11/26/18 0919  . atorvastatin (LIPITOR) tablet 40 mg  40 mg Oral Q2000 Hosie Poisson, MD   40 mg at 11/25/18 2026  . azithromycin (ZITHROMAX) tablet 500 mg  500 mg Oral Q24H Hosie Poisson, MD   500 mg at 11/25/18 2026  . chlorhexidine (PERIDEX) 0.12 % solution 15 mL  15 mL Mouth Rinse BID Hosie Poisson, MD   15 mL at 11/26/18 0919  . feeding supplement (ENSURE ENLIVE) (ENSURE ENLIVE) liquid 237 mL  237 mL Oral BID BM Hosie Poisson, MD   237 mL at 11/25/18 0927  . flecainide (TAMBOCOR) tablet 50 mg  50 mg Oral Q12H Thompson Grayer, MD   50 mg at 11/26/18 0919  . fluticasone (FLONASE) 50 MCG/ACT nasal spray 2 spray  2 spray Each Nare Daily Hosie Poisson, MD   2 spray at 11/26/18 0919  . folic acid (FOLVITE) tablet 1 mg  1 mg Oral Daily Hosie Poisson, MD   1 mg at 11/26/18 4270  . furosemide (LASIX) tablet 20 mg  20 mg Oral BID Hosie Poisson, MD   20 mg at 11/26/18 0919  . gabapentin (NEURONTIN) capsule 100 mg  100 mg Oral TID Hosie Poisson, MD   100 mg at 11/26/18 0919  . guaiFENesin (MUCINEX) 12 hr tablet 1,200 mg  1,200 mg Oral BID Hosie Poisson, MD   1,200 mg at 11/26/18 0919  . guaiFENesin-dextromethorphan (ROBITUSSIN DM) 100-10 MG/5ML syrup 5 mL  5 mL Oral Q4H PRN Hosie Poisson, MD   5 mL at 11/24/18 2103  . hydrALAZINE (APRESOLINE) injection 5 mg  5 mg Intravenous Q4H PRN Hosie Poisson, MD      . HYDROcodone-acetaminophen (NORCO/VICODIN) 5-325 MG per tablet 1-2 tablet  1-2 tablet Oral Q4H PRN Hosie Poisson, MD   2 tablet at 11/26/18 0925  . ipratropium (ATROVENT HFA) inhaler 2 puff  2 puff Inhalation TID Hosie Poisson, MD   2 puff at 11/26/18 0818  . levalbuterol [facility] HFA) inhaler 2 puff  2 puff Inhalation TID Hosie Poisson, MD   2 puff at 11/26/18 0819  . levalbuterol (XOPENEX HFA) inhaler 2 puff  2 puff Inhalation Q4H PRN Hosie Poisson, MD      . lip balm (CARMEX) ointment 1 application  1 application Topical PRN Hosie Poisson, MD   1 application at 62/37/62 0254  . loratadine (CLARITIN) tablet 10 mg  10 mg Oral Daily Hosie Poisson, MD   10 mg at 11/26/18 0919  . MEDLINE mouth rinse  15 mL Mouth Rinse q12n4p Hosie Poisson, MD   15 mL at 11/25/18 1725  . metoprolol tartrate (LOPRESSOR) injection 2.5 mg  2.5 mg Intravenous Q2H PRN Hosie Poisson, MD   2.5 mg at 11/22/18 2133  .  metoprolol tartrate (LOPRESSOR) tablet 100 mg  100 mg Oral BID Candee Furbish C, MD      . morphine 2 MG/ML injection 0.5-1 mg  0.5-1 mg Intravenous Q4H PRN Hosie Poisson, MD   0.5 mg at 11/25/18 1824  . multivitamin with minerals tablet 1 tablet  1 tablet Oral Daily Hosie Poisson, MD   1 tablet at 11/26/18 [phone]  . ondansetron (ZOFRAN) tablet 4 mg  4 mg Oral Q6H PRN Hosie Poisson, MD       Or  . ondansetron (ZOFRAN) injection 4 mg  4 mg Intravenous Q6H PRN Hosie Poisson, MD      . pantoprazole (PROTONIX) EC tablet 40 mg  40 mg Oral Q0600 Hosie Poisson, MD   40 mg at 11/26/18 0515  . polyethylene glycol (MIRALAX / GLYCOLAX) packet 17 g  17 g Oral Daily PRN Hosie Poisson, MD      . polyvinyl alcohol (LIQUIFILM TEARS) 1.4 % ophthalmic solution 1 drop  1 drop Both Eyes Daily PRN Hosie Poisson, MD      . prochlorperazine (COMPAZINE) tablet 10 mg  10 mg Oral Q6H PRN Hosie Poisson, MD      . sodium chloride flush (NS) 0.9 % injection 10-40 mL  10-40 mL Intracatheter Q12H Hosie Poisson, MD   10 mL at 11/25/18 2026  . sodium chloride flush (NS) 0.9 % injection 10-40 mL  10-40 mL Intracatheter PRN Hosie Poisson, MD   10 mL at 11/25/18 1824  . umeclidinium-vilanterol (ANORO ELLIPTA) 62.5-25 MCG/INH 1 puff  1 puff Inhalation Daily Hosie Poisson, MD   1 puff at 11/26/18 0818     Discharge Medications: Please see discharge summary for a list of discharge medications.  Relevant Imaging Results:  Relevant Lab Results:   Additional Information SSN: [id number]  Leeroy Cha, RN

## 2018-11-26 NOTE — Progress Notes (Addendum)
Progress Note  Patient Name: Donna Mcneil Date of Encounter: 11/26/2018  Primary Cardiologist: Jenkins Rouge, MD   Subjective   Getting breathing treatment. No CP. Baseline SOB. "I do not want to go to a nursing home"  Inpatient Medications    Scheduled Meds: . ALPRAZolam  1 mg Oral Q2000  . apixaban  2.5 mg Oral BID  . atorvastatin  40 mg Oral Q2000  . azithromycin  500 mg Oral Q24H  . chlorhexidine  15 mL Mouth Rinse BID  . Chlorhexidine Gluconate Cloth  6 each Topical Daily  . feeding supplement (ENSURE ENLIVE)  237 mL Oral BID BM  . flecainide  50 mg Oral Q12H  . fluticasone  2 spray Each Nare Daily  . folic acid  1 mg Oral Daily  . furosemide  20 mg Oral BID  . gabapentin  100 mg Oral TID  . guaiFENesin  1,200 mg Oral BID  . ipratropium  2 puff Inhalation TID  . levalbuterol  2 puff Inhalation TID  . loratadine  10 mg Oral Daily  . mouth rinse  15 mL Mouth Rinse q12n4p  . metoprolol tartrate  75 mg Oral TID  . multivitamin with minerals  1 tablet Oral Daily  . pantoprazole  40 mg Oral Q0600  . predniSONE  40 mg Oral QAC breakfast  . sodium chloride flush  10-40 mL Intracatheter Q12H  . umeclidinium-vilanterol  1 puff Inhalation Daily   Continuous Infusions: . sodium chloride 10 mL/hr at 11/24/18 0011   PRN Meds: sodium chloride, acetaminophen **OR** [DISCONTINUED] acetaminophen, acetaminophen, guaiFENesin-dextromethorphan, hydrALAZINE, HYDROcodone-acetaminophen, levalbuterol, lip balm, metoprolol tartrate, morphine injection, ondansetron **OR** ondansetron (ZOFRAN) IV, polyethylene glycol, polyvinyl alcohol, prochlorperazine, sodium chloride flush   Vital Signs    Vitals:   11/25/18 1812 11/25/18 2043 11/25/18 2117 11/26/18 0543  BP:   (!) 150/58 (!) 157/62  Pulse:   (!) 59 (!) 51  Resp:   (!) 23 (!) 24  Temp: (!) 97.4 F (36.3 C)  97.7 F (36.5 C) (!) 97.5 F (36.4 C)  TempSrc: Oral  Oral Oral  SpO2:  98% 100% 96%  Weight:    61.2 kg  Height:        No intake or output data in the 24 hours ending 11/26/18 0819 Last 3 Weights 11/26/2018 11/25/2018 11/24/2018  Weight (lbs) 134 lb 14.7 oz 133 lb 6.1 oz 133 lb 13.1 oz  Weight (kg) 61.2 kg 60.5 kg 60.7 kg      Telemetry    SR, SB occasional PAC's, no adverse rhythms - Personally Reviewed  ECG    11/26/18 - sinus brady 49, PAC, QRS duration 80 on flecainide. Prior SB 56 on 3/29- Personally Reviewed  Physical Exam   GEN: No acute distress. In bed getting breathing treatment  Cardiac: brady reg on tele with occasional ectopy.  Respiratory: Minimally increased work of breathing (baseline likely) GI: non-distended  MS: No deformity. Neuro:  Nonfocal  Psych: Normal affect   Labs    Chemistry Recent Labs  Lab 11/19/18 1311  11/22/18 0355 11/23/18 0936 11/24/18 0514  NA 136   < > 132* 133* 137  K 3.6   < > 3.8 3.4* 3.8  CL 98   < > 95* 94* 97*  CO2 27   < > 26 30 33*  GLUCOSE 121*   < > 150* 248* 91  BUN 19   < > 44* 34* 29*  CREATININE 0.57   < > 0.98 0.70  0.65  CALCIUM 9.3   < > 7.7* 7.5* 7.8*  PROT 7.4  --   --   --   --   ALBUMIN 2.4*  --   --   --   --   AST 20  --   --   --   --   ALT 20  --   --   --   --   ALKPHOS 94  --   --   --   --   BILITOT 0.5  --   --   --   --   GFRNONAA >60   < > 55* >60 >60  GFRAA >60   < > >60 >60 >60  ANIONGAP 11   < > 11 9 7    < > = values in this interval not displayed.     Hematology Recent Labs  Lab 11/20/18 0508 11/21/18 0557 11/22/18 0355  WBC 4.9 6.3 9.1  RBC 2.97* 2.96* 2.95*  HGB 9.1* 9.1* 9.1*  HCT 28.4* 28.1* 28.2*  MCV 95.6 94.9 95.6  MCH 30.6 30.7 30.8  MCHC 32.0 32.4 32.3  RDW 18.2* 18.4* 17.6*  PLT 479* 609* 619*    Cardiac Enzymes Recent Labs  Lab 11/20/18 1645 11/20/18 2252 11/25/18 2058 11/26/18 0259  TROPONINI 0.03* 0.04* 0.03* 0.03*   No results for input(s): TROPIPOC in the last 168 hours.   BNP Recent Labs  Lab 11/19/18 1312  BNP 540.4*     DDimer No results for input(s): DDIMER  in the last 168 hours.   Radiology    Dg Chest Port 1 View  Result Date: 11/25/2018 CLINICAL DATA:  Stage IV non-small-cell lung cancer. Shortness of breath. EXAM: PORTABLE CHEST 1 VIEW COMPARISON:  11/21/2018 FINDINGS: Right Port-A-Cath tip at low SVC. Midline trachea. Mild cardiomegaly. Atherosclerosis in the transverse aorta. Small bilateral pleural effusions are similar. No pneumothorax. Biapical pleural thickening. Similar interstitial thickening. Hyperinflation. Left greater than right base airspace disease is not significantly changed. Slight improvement in right upper lobe airspace disease. IMPRESSION: Improved right upper lobe airspace disease, possibly decreased infection. Otherwise, similar appearance of the chest with mild pulmonary venous congestion, small bilateral pleural effusions, and bibasilar airspace disease. Aortic Atherosclerosis (ICD10-I70.0). Electronically Signed   By: Abigail Miyamoto M.D.   On: 11/25/2018 10:31    Cardiac Studies   ECHO 09/02/2018 - 70% EF  Patient Profile     78 y.o. female with paroxysmal AFIB, stage 4 lung CA, COPD with 3L o2 at home, demand ischemia  Assessment & Plan    PAF  - HR 48 - 56 on most recent ECG's, On tele in the 60's at times. Stable.   - On Flecainide 50mg  BID (per Dr. Rayann Heman) - tolerating well. QRS normal  - Metoprolol 75mg  TID, will consolidate to metoprolol 100mg  PO BID for ease of use - Dig and Dilt were DC'd  - Thyroid nl   - CHADVASC score 4 (age >35, HTN, F). On Eliquis 2.5 BID - watch for bleeding. Baseline anemia   Demand ischemia  - flat troponin 0.03 x 2 in the setting of PAF. Not ACS  Stage 4 NSCLC  - onc team    - advanced left lung malignancy  Chronic resp failure on 3L at home  - COPD  Lower extremity edema  - improved with lasix.   At this point stable from cardiac perspective. Will sign off, let me know if any further assistance is needed.   [facility] will sign  off.   Medication Recommendations:   Consolidated metoprolol to 100mg  PO BID Other recommendations (labs, testing, etc):  Follow CBC per primary team Follow up as an outpatient: Dr. Johnsie Cancel or team in 1-2 months.   For questions or updates, please contact [place] Please consult [email or web address] for contact info under        Signed, Candee Furbish, MD  11/26/2018, 8:19 AM

## 2018-11-26 NOTE — TOC Progression Note (Signed)
Transition of Care [facility]) - Progression Note    Patient Details  Name: Donna Mcneil MRN: [id number] Date of Birth: 03-26-1941  [facility] CM/SW Contact  Leeroy Cha, RN Phone Number: 11/26/2018, 12:56 PM  Clinical Narrative:    Explained the services of [facility].  The patient will be alone and there is no one coming in or out of the house.  States she had a bad experience at [employer] [employer] but would look at other places.  CSW and MD notified.   Expected Discharge Plan: [place] Barriers to Discharge: Continued Medical Work up  Expected Discharge Plan and Services Expected Discharge Plan: [place] arrangements for the past 2 months: Single Family Home Expected Discharge Date: (unknown)                         Social Determinants of Health (SDOH) Interventions    Readmission Risk Interventions Readmission Risk Prevention Plan 11/22/2018  Transportation Screening Complete  Medication Review [employer]) Complete  PCP or Specialist appointment within 3-5 days of discharge Complete  HRI or [place] Complete  SW Recovery Care/Counseling Consult Complete  Palliative Care Screening Not [place] Complete  Some recent data might be hidden

## 2018-11-26 NOTE — Care Management Important Message (Signed)
Important Message  Patient Details  Name: Donna Mcneil MRN: [id number] Date of Birth: 07/07/1941   Medicare Important Message Given:  Yes    Kerin Salen 11/26/2018, 9:56 AMImportant Message  Patient Details  Name: Donna Mcneil MRN: [id number] Date of Birth: 11-03-1940   Medicare Important Message Given:  Yes    Kerin Salen 11/26/2018, 9:56 AM

## 2018-11-26 NOTE — Progress Notes (Signed)
Physical Therapy Treatment Patient Details Name: Donna Mcneil MRN: [id number] DOB: 1941-06-01 Today's Date: 11/26/2018    History of Present Illness 78 yo female admitted to ED on 3/23 with shortness of breath, on home O2. Pt with stage IV lung cancer. PMH includes COPD exacerbation, diverticulitis, HTN, lupus, OP, ARF with hypoxia, malignant pleural effusion, RA, peripheral neuropathy, anxiety, broncitis, loop sigmoid colectomy.     PT Comments    Assisted OOB to recliner only due to Max c/o fatigue.  General transfer comment: + 2 side by side assist to stand and a take a few pivot steps to recliner.  Static standing < 30 seconds when pt requested to sit due to fatigue/dyspnea   General Gait Details: Transfers only this session due to weakness/dyspnea.  Follow Up Recommendations  SNF;Supervision/Assistance - 24 hour     Equipment Recommendations       Recommendations for Other Services       Precautions / Restrictions Precautions Precautions: Fall Precaution Comments: monitor oxygen  Restrictions Weight Bearing Restrictions: No    Mobility  Bed Mobility Overal bed mobility: Needs Assistance Bed Mobility: Supine to Sit     Supine to sit: Mod assist     General bed mobility comments: upper body assist (bad shoulders)  Transfers Overall transfer level: Needs assistance Equipment used: 2 person hand held assist Transfers: Sit to/from Stand;Stand Pivot Transfers Sit to Stand: +2 physical assistance;Mod assist Stand pivot transfers: Max assist;+2 physical assistance       General transfer comment: + 2 side by side assist to stand and a take a few pivot steps to recliner.  Static standing < 30 seconds when pt requested to sit due to fatigue/dyspnea  Ambulation/Gait             General Gait Details: Transfers only this session due to [employer] Mobility    Modified Rankin (Stroke Patients Only)        Balance                                            Cognition Arousal/Alertness: Awake/alert Behavior During Therapy: WFL for tasks assessed/performed Overall Cognitive Status: Within Functional Limits for tasks assessed                                 General Comments: very limited activity tolerance       Exercises      General Comments        Pertinent Vitals/Pain Pain Assessment: No/denies pain    Home Living                      Prior Function            PT Goals (current goals can now be found in the care plan section) Progress towards PT goals: Progressing toward goals    Frequency    Min 2X/week      PT Plan Current plan remains appropriate    Co-evaluation              AM-PAC PT "6 Clicks" Mobility   Outcome Measure  Help needed turning from your back to your side while in a flat bed without using bedrails?: A  Lot Help needed moving from lying on your back to sitting on the side of a flat bed without using bedrails?: A Lot Help needed moving to and from a bed to a chair (including a wheelchair)?: A Lot Help needed standing up from a chair using your arms (e.g., wheelchair or bedside chair)?: A Lot Help needed to walk in hospital room?: A Lot Help needed climbing 3-5 steps with a railing? : A Lot 6 Click Score: 12    End of Session Equipment Utilized During Treatment: Oxygen Activity Tolerance: Patient limited by fatigue Patient left: in chair;with call bell/phone within reach Nurse Communication: Mobility status PT Visit Diagnosis: Muscle weakness (generalized) (M62.81);Other abnormalities of gait and mobility (R26.89)     Time: 1456-1510 PT Time Calculation (min) (ACUTE ONLY): 14 min  Charges:  $Therapeutic Activity: 8-22 mins                     {Dastan Krider  PTA Acute  Rehabilitation Services Pager      [phone] Office      [phone]

## 2018-11-26 NOTE — Progress Notes (Signed)
Palliative Care Progress Note  Reason for consult: Goals of care   I met today with Donna Mcneil in her room.    She reports feeling better and with less SOB today.  Reviewed her conversation with cardiology this AM as well.  We discussed clinical course and plan moving forward.  She reports that she is not going to agree to SNF regardless of recommendations and plans to discharge home at end of hospitalization.  - DNR/DNI - Her goal is to see how she does following discharge and then follow-up with Dr. Julien Nordmann to discuss care plan for her lung cancer.  Discussed concern that if she does not improve, she may not be a candidate for further disease modifying therapy. - She reports that she is not agreeable to SNF at time of discharge.  Discussed concerns with safety of her going home (lives alone but has help from family), particularly as he is alone all night.  She expressed understanding concern but states that she is going to her home on discharge regardless of recommendations. - Recommended consideration for outpatient palliative care follow-up.  She reports she will consider and d/w Dr. Julien Nordmann, but declines at this time.  Questions and concerns addressed. PMT will continue to support holistically.  Total time: 40 minutes Greater than 50% of this time was spent counseling and coordinating care related to the above assessment and plan.  Micheline Rough, MD [place] Team [phone]

## 2018-11-26 NOTE — Consult Note (Signed)
Palliative Care Consult Note  Reason for consult: Goals of care   Palliative care consult received.  Chart reviewed including personal review of pertinent labs and imaging.  Discussed with primary attending and RN.  Briefly, Donna Mcneil is a 78 year old female with stage IV NSCLC on chemo, lupus/RA, chronic hypoxic respiratory failure, COPD, productive cough, admitted from home.  Her course has been complicated by development of a-fib with continued periods of hypoxia.  I met today with her in her room.  I introduced palliative care as specialized medical care for people living with serious illness. It focuses on providing relief from the symptoms and stress of a serious illness. The goal is to improve quality of life for both the patient and the family.  She reports living alone and having her ex-husband, grandson, friend, and caregiver who check in on her.  States that her independence is very important to her.  States that her ex-husband is her Air traffic controller and she has appointed him as her HCPOA (there is a copy of durable but not HCPOA on chart), but she reports that she has named him as HCPOA as well.  We discussed clinical course as well as wishes moving forward in regard to advanced directives.  Concepts specific to code status and care plan following hospitalization discussed.  We discussed difference between a aggressive medical intervention path and a palliative, comfort focused care path.  Values and goals of care important to patient and family were attempted to be elicited.  - DNR/DNI - Continue current care plan.  She reports that she is not agreeable to SNF at time of discharge.  Her goal is to see how she does over throughout this hospitalization and then follow-up with Dr. Julien Nordmann to discuss care plan for her lung cancer.  I discussed with her today regarding concern that if she does not improve, she may not be a candidate for further disease modifying therapy. - Reports  that pain at home has been a continuing issue.  Is happy with current regimen of Vicodin as needed.  Will continue to monitor usage and recommend adjustments as appropriate. - Plan to follow-up tomorrow for continuation of discussion of care plan and goals based on her clinical course.   Questions and concerns addressed.   PMT will continue to support holistically.  Total time: 60 minutes Greater than 50%  of this time was spent counseling and coordinating care related to the above assessment and plan.  Micheline Rough, MD [place] Team [phone]

## 2018-11-26 NOTE — Progress Notes (Signed)
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  Patient with a prior tobacco history and quit in January 2020 after her diagnosis of lung cancer.  Patient also presented with increased lower extremity edema with associated nausea.  CT angiogram chest was negative for PE however noticed advanced left lung malignancy, pleural effusion and new extensive bilateral peripheral nodular and heterogeneous airspace opacity and new enlarged paratracheal lymph nodes consistent with infection versus edema.  ID, Dr. Johnnye Sima was curbside by nurse practitioner Forrest Moron the evening of 11/19/2018 and it was felt testing for COVID 19 was not warranted at this time.  Overnight pt was transferred back to step down as she went into afib with RVR.  Palliative care consulted for goals of care.  She is currently on the floor intermittently requiring 15 lit of [place] oxygen.    Assessment & Plan:   Principal Problem:   Acute respiratory failure with hypoxia (HCC) Active Problems:   Seropositive rheumatoid arthritis (HCC)   Hyperlipidemia   Hypertension   Cutaneous lupus erythematosus   COPD (chronic obstructive pulmonary disease) (HCC)   Chronic respiratory failure with hypoxia (HCC)   Hyponatremia   Malignant pleural effusion   Adenocarcinoma of left lung, stage 4 (HCC)   Port-A-Cath in place   Pressure injury of skin   Tachycardia   Acute on chronic diastolic CHF (congestive heart failure) (HCC)   Atrial fibrillation with RVR (HCC)  Acute on chronic respiratory failure with hypoxia secondary to acute COPD exacerbation and acute on  chronic diastolic heart failure. Improving.  Respiratory panel is negative influenza PCR is negative pro calcitonin level still elevated.   Urine strep pneumo antigen and legionella antigen is negative. Afebrile overnight, WBC count within normal limits. Start tapering steroids as her wheezing has improved and we will try to wean her oxygen as appropraite.  D/c antibiotics after today's dose.  Continue with Brovana and Pulmicort. Repeat CXR today shows improvement in the pneumonia.    Atrial fibrillation with RVR Rate better today  And in sinus rhythm with  oral metoprolol.  D/c IV cardizem and IV digoxin. She was started on flecainide by cardiology.  Thyroid panel within normal limits.    Anemia of chronic disease Hemoglobin stable around 9.  Continue to monitor and transfuse to keep hemoglobin greater than 7.    Acute on chronic bilateral lower extremity edema probably secondary to mild acute on chronic diastolic heart failure. Improving with IV Lasix. Change to oral.  Continue with strict intake and output.  Fluid restriction and daily weights.     Stage IV non-small cell lung cancer Patient follows up with Dr. Julien Nordmann.   History of SLE and rheumatoid arthritis Patient was on methotrexate 15 mg, which is on hold for now    History of tobacco use disorder Patient recently quit smoking in January 2020.   Hypertension Well controlled resume with Norvasc and Lasix   Hyperlipidemia resume statin   PT recommended SNF, she initially refused SNF, but she is not stable for discharge home at this time.  SW consulted and we are waiting for a bed at [facility] for discharge.  DVT prophylaxis: Eliquis Code Status: DNR Family Communication: None at bedside Disposition Plan: Pending clinical improvement. Possible transfer to the floor if able to wean her oxygen appropriate for the floor.    Consultants:   Cardiology  Infectious disease  Procedures: None   Antimicrobials: Rocephin and Zithromax   Subjective: Stable on 4 to 5 lit of [place] oxygen. No chest pain.   Objective: Vitals:   11/25/18 2117 11/26/18 0543 11/26/18 1336 11/26/18 1427  BP: (!) 150/58 (!) 157/62  (!) 144/70  Pulse: (!) 59 (!) 51  66  Resp: (!) 23 (!) 24  (!) 22  Temp: 97.7 F (36.5 C) (!) 97.5 F (36.4 C)  97.7 F (36.5 C)  TempSrc: Oral Oral  Oral  SpO2: 100% 96% 99% 98%  Weight:  61.2 kg    Height:        Intake/Output Summary (Last 24 hours) at 11/26/2018 1609 Last data filed at 11/26/2018 0910 Gross per 24 hour  Intake 120 ml  Output 0 ml  Net 120 ml   Filed Weights   11/24/18 0600 11/25/18 0500 11/26/18 0543  Weight: 60.7 kg 60.5 kg 61.2 kg    Examination:  General exam: no distress on 5 lit of [place] oxygen.  Respiratory system: diminished air entry, tachypnea Cardiovascular system: S1 & S2 heard, regular.  Gastrointestinal system: Abdomen is soft NT nd bs+ Central nervous system: Alert and oriented. No focal neurological deficits. Extremities: edema improving.  Skin: No rashes, lesions or ulcers Psychiatry: . Mood & affect appropriate.     Data Reviewed: I have personally reviewed following labs and imaging studies  CBC: Recent Labs  Lab 11/20/18 0508 11/21/18 0557 11/22/18 0355  WBC 4.9 6.3 9.1  HGB 9.1* 9.1* 9.1*  HCT 28.4* 28.1* 28.2*  MCV 95.6 94.9 95.6  PLT 479* 609* 272*   Basic Metabolic Panel: Recent Labs  Lab 11/20/18 0508 11/21/18 0557 11/22/18 0355 11/23/18 0936 11/24/18 0514  NA 138 133* 132* 133* 137  K 3.9 3.7 3.8 3.4* 3.8  CL 99 95* 95* 94* 97*  CO2 28 28 26 30  33*  GLUCOSE 130* 158* 150* 248* 91  BUN 19 35* 44* 34* 29*  CREATININE 0.59 0.93 0.98 0.70 0.65  CALCIUM 8.5* 7.9* 7.7* 7.5* 7.8*  MG 2.0  --  2.4  --   --    GFR: Estimated Creatinine Clearance: 49.9 mL/min (by C-G formula based on SCr of 0.65 mg/dL). Liver Function Tests: No results for input(s): AST, ALT, ALKPHOS, BILITOT, PROT, ALBUMIN in the  last 168 hours. No results for input(s): LIPASE, AMYLASE in the last 168 hours. No results for input(s): AMMONIA in the last 168 hours. Coagulation Profile: Recent Labs  Lab 11/20/18 1102 11/21/18 0557  INR 0.9 1.2   Cardiac Enzymes: Recent Labs  Lab 11/20/18 1645 11/20/18 2252 11/25/18 2058 11/26/18 0259 11/26/18 0900  TROPONINI 0.03* 0.04* 0.03* 0.03* <0.03   BNP (last 3 results) No results for input(s): PROBNP in the last 8760 hours. HbA1C: No results for input(s): HGBA1C in the last 72 hours. CBG: No results for input(s): GLUCAP in the last 168 hours. Lipid Profile: No results for input(s): CHOL, HDL, LDLCALC, TRIG, CHOLHDL, LDLDIRECT in the last 72 hours. Thyroid Function Tests: No results for input(s): TSH, T4TOTAL, FREET4, T3FREE, THYROIDAB in the last 72 hours. Anemia Panel: No results for input(s): VITAMINB12, FOLATE, FERRITIN, TIBC, IRON, RETICCTPCT in the last 72 hours. Sepsis Labs: Recent Labs  Lab 11/19/18 2113 11/20/18 5366 11/21/18 0557  PROCALCITON 59.26 49.07 34.54    Recent Results (from the past 240 hour(s))  Blood culture (routine x 2)     Status: None   Collection Time: 11/19/18 12:46 PM  Result Value Ref Range Status   Specimen Description   Final    BLOOD PORTA CATH RIGHT SIDE Performed at [facility] [address] [place], [place] [address]    Special Requests   Final    BOTTLES DRAWN AEROBIC AND ANAEROBIC Blood Culture adequate volume Performed at [place] [address] [place], [place] [address]    Culture   Final    NO GROWTH 5 DAYS Performed at [facility], [place] [address] [place], [place] [address]    Report Status 11/24/2018 FINAL  Final  Blood culture (routine x 2)     Status: None   Collection Time: 11/19/18 12:52 PM  Result Value Ref Range Status   Specimen Description   Final    BLOOD RIGHT ANTECUBITAL Performed at [facility] [address] [place], [place] [address]    Special Requests   Final    BOTTLES DRAWN AEROBIC AND ANAEROBIC Blood Culture adequate volume Performed at [facility] [address], [place], [place] [address]    Culture   Final    NO GROWTH 5 DAYS Performed at [facility], [place] [address] [place], [place] [address]    Report Status 11/24/2018 FINAL  Final  Respiratory Panel by PCR     Status: None   Collection Time: 11/19/18  9:13 PM  Result Value Ref Range Status   Adenovirus NOT DETECTED NOT DETECTED Final   Coronavirus 229E NOT DETECTED NOT DETECTED Final    Comment: (NOTE) The Coronavirus on the Respiratory Panel, DOES NOT test for the novel  Coronavirus (2019 nCoV)    Coronavirus HKU1 NOT DETECTED NOT DETECTED Final   Coronavirus NL63 NOT DETECTED NOT DETECTED Final   Coronavirus OC43 NOT DETECTED NOT DETECTED Final   Metapneumovirus NOT DETECTED NOT DETECTED Final   Rhinovirus / Enterovirus NOT DETECTED NOT DETECTED Final   Influenza A NOT DETECTED NOT DETECTED Final   Influenza B NOT DETECTED NOT DETECTED Final   Parainfluenza Virus 1 NOT DETECTED NOT DETECTED Final   Parainfluenza Virus 2 NOT DETECTED NOT DETECTED Final   Parainfluenza Virus 3 NOT DETECTED NOT DETECTED Final   Parainfluenza Virus 4 NOT DETECTED NOT DETECTED Final   Respiratory Syncytial Virus NOT DETECTED NOT DETECTED Final   Bordetella pertussis NOT DETECTED NOT DETECTED Final   Chlamydophila pneumoniae NOT DETECTED NOT DETECTED Final   Mycoplasma pneumoniae NOT DETECTED NOT DETECTED Final    Comment: Performed at [facility], [place]. [address], [place], [place] [address]  MRSA PCR Screening     Status: None   Collection Time: 11/19/18  9:13 PM  Result Value Ref Range Status   MRSA by PCR NEGATIVE NEGATIVE Final    Comment:        The GeneXpert MRSA Assay (FDA approved for NASAL specimens only), is one component of a comprehensive MRSA colonization surveillance program. It is not  intended to diagnose MRSA infection nor to guide or monitor treatment for MRSA infections. Performed at [facility], [place] [address], [place], [place] [address]   Culture, Urine     Status: None   Collection Time: 11/20/18  8:00 AM  Result Value Ref Range Status   Specimen Description  Final    URINE, RANDOM Performed at [facility], [place] [address] [place], [place] [address]    Special Requests   Final    NONE Performed at [facility], [place] [address], [place], [place] [address]    Culture   Final    NO GROWTH Performed at [facility], [place] [address], [place], [place] [address]    Report Status 11/21/2018 FINAL  Final  Culture, sputum-assessment     Status: None   Collection Time: 11/20/18 11:37 AM  Result Value Ref Range Status   Specimen Description SPUTUM  Final   Special Requests Immunocompromised  Final   Sputum evaluation   Final    THIS SPECIMEN IS ACCEPTABLE FOR SPUTUM CULTURE Performed at [facility], [place] [address], [place], [place] [address]    Report Status 11/20/2018 FINAL  Final  Culture, respiratory     Status: None   Collection Time: 11/20/18 11:37 AM  Result Value Ref Range Status   Specimen Description   Final    SPUTUM Performed at [place] [address] [place], [place] [address]    Special Requests   Final    Immunocompromised Reflexed from [id number] Performed at [facility], [place] [address], [place],  [address]    Gram Stain   Final    ABUNDANT WBC PRESENT, PREDOMINANTLY PMN FEW GRAM POSITIVE COCCI FEW GRAM POSITIVE RODS    Culture   Final    FEW Consistent with normal respiratory flora. Performed at [facility], [place] [address] [place],  [address]    Report Status 11/22/2018 FINAL  Final         Radiology Studies: Dg Chest Port 1 View  Result Date: 11/25/2018 CLINICAL DATA:  Stage IV  non-small-cell lung cancer. Shortness of breath. EXAM: PORTABLE CHEST 1 VIEW COMPARISON:  11/21/2018 FINDINGS: Right Port-A-Cath tip at low SVC. Midline trachea. Mild cardiomegaly. Atherosclerosis in the transverse aorta. Small bilateral pleural effusions are similar. No pneumothorax. Biapical pleural thickening. Similar interstitial thickening. Hyperinflation. Left greater than right base airspace disease is not significantly changed. Slight improvement in right upper lobe airspace disease. IMPRESSION: Improved right upper lobe airspace disease, possibly decreased infection. Otherwise, similar appearance of the chest with mild pulmonary venous congestion, small bilateral pleural effusions, and bibasilar airspace disease. Aortic Atherosclerosis (ICD10-I70.0). Electronically Signed   By: Abigail Miyamoto M.D.   On: 11/25/2018 10:31        Scheduled Meds: . ALPRAZolam  1 mg Oral Q2000  . apixaban  2.5 mg Oral BID  . atorvastatin  40 mg Oral Q2000  . azithromycin  500 mg Oral Q24H  . chlorhexidine  15 mL Mouth Rinse BID  . feeding supplement (ENSURE ENLIVE)  237 mL Oral BID BM  . flecainide  50 mg Oral Q12H  . fluticasone  2 spray Each Nare Daily  . folic acid  1 mg Oral Daily  . furosemide  20 mg Oral BID  . gabapentin  100 mg Oral TID  . guaiFENesin  1,200 mg Oral BID  . ipratropium  2 puff Inhalation TID  . levalbuterol  2 puff Inhalation TID  . loratadine  10 mg Oral Daily  . mouth rinse  15 mL Mouth Rinse q12n4p  . metoprolol tartrate  100 mg Oral BID  . multivitamin with minerals  1 tablet Oral Daily  . pantoprazole  40 mg Oral Q0600  . sodium chloride  flush  10-40 mL Intracatheter Q12H  . umeclidinium-vilanterol  1 puff Inhalation Daily   Continuous Infusions: . sodium chloride 10 mL/hr at 11/24/18 0011     LOS: 7 days    Time spent: 26 minutes.    Hosie Poisson, MD [facility] Pager [phone]  If 7PM-7AM, please contact night-coverage [email or web address] Password [email or web address]  11/26/2018, 4:09 PM

## 2018-11-27 ENCOUNTER — Inpatient Hospital Stay (HOSPITAL_COMMUNITY): Payer: Medicare Other

## 2018-11-27 MED ORDER — PREDNISONE 20 MG PO TABS
30.0000 mg | ORAL_TABLET | Freq: Every day | ORAL | Status: AC
  Administered 2018-11-27 – 2018-11-29 (×3): 30 mg via ORAL
  Filled 2018-11-27 (×3): qty 1

## 2018-11-27 MED ORDER — ALPRAZOLAM 1 MG PO TABS
1.0000 mg | ORAL_TABLET | Freq: Two times a day (BID) | ORAL | Status: DC | PRN
  Administered 2018-11-27 – 2018-11-30 (×6): 1 mg via ORAL
  Filled 2018-11-27 (×6): qty 1

## 2018-11-27 NOTE — Progress Notes (Signed)
Physical Therapy Treatment Patient Details Name: NIYATI HEINKE MRN: [id number] DOB: [birth year] Today's Date: 11/27/2018    History of Present Illness 78 yo female admitted to ED on 3/23 with shortness of breath, on home O2. Pt with stage IV lung cancer. PMH includes COPD exacerbation, diverticulitis, HTN, lupus, OP, ARF with hypoxia, malignant pleural effusion, RA, peripheral neuropathy, anxiety, broncitis, loop sigmoid colectomy.     PT Comments    Pt was too fatigued for OOB activity at this time. She did agree to bed level exercises. O2 sat level >90% on 15L HFNC. Will continue to follow and progress activity as tolerated.    Follow Up Recommendations  SNF     Equipment Recommendations  (TBD at next venue)    Recommendations for Other Services       Precautions / Restrictions Precautions Precautions: Fall Precaution Comments: monitor oxygen. Pt now on high flow oxygen Restrictions Weight Bearing Restrictions: No    Mobility  Bed Mobility Overal bed mobility: Needs Assistance          General bed mobility comments: NT  Transfers            Ambulation/Gait                 Stairs             Wheelchair Mobility    Modified Rankin (Stroke Patients Only)       Balance Overall balance assessment: Needs assistance;History of Falls(fall during hospitalization per pt) Sitting-balance support: Feet unsupported;Single extremity supported Sitting balance-Leahy Scale: Fair     Standing balance support: Bilateral upper extremity supported Standing balance-Leahy Scale: Poor(NT)                              Cognition Arousal/Alertness: Awake/alert Behavior During Therapy: WFL for tasks assessed/performed Overall Cognitive Status: Within Functional Limits for tasks assessed                                        Exercises General Exercises - Lower Extremity Ankle Circles/Pumps: AROM;Both;15  reps;Supine Gluteal Sets: AROM;10 reps;Supine Short Arc Quad: AROM;Both;10 reps;Supine    General Comments        Pertinent Vitals/Pain Pain Assessment: Faces Faces Pain Scale: Hurts even more Pain Location: L flank Pain Descriptors / Indicators: Sore;Discomfort Pain Intervention(s): Limited activity within patient's tolerance;RN gave pain meds during session    Home Living                      Prior Function            PT Goals (current goals can now be found in the care plan section) Progress towards PT goals: Progressing toward goals    Frequency    Min 2X/week      PT Plan Current plan remains appropriate    Co-evaluation              AM-PAC PT "6 Clicks" Mobility   Outcome Measure  Help needed turning from your back to your side while in a flat bed without using bedrails?: A Lot Help needed moving from lying on your back to sitting on the side of a flat bed without using bedrails?: A Lot Help needed moving to and from a bed to a chair (including a wheelchair)?: A Lot Help needed standing  up from a chair using your arms (e.g., wheelchair or bedside chair)?: A Lot Help needed to walk in hospital room?: A Lot Help needed climbing 3-5 steps with a railing? : Total 6 Click Score: 11    End of Session Equipment Utilized During Treatment: Oxygen Activity Tolerance: Patient limited by fatigue(on 15L O2) Patient left: in bed;with bed alarm set;with call bell/phone within reach   PT Visit Diagnosis: Muscle weakness (generalized) (M62.81);Other abnormalities of gait and mobility (R26.89)     Time: 0272-5366 PT Time Calculation (min) (ACUTE ONLY): 8 min  Charges:  $Therapeutic Exercise: 8-22 mins                        Weston Anna, PT Acute Rehabilitation Services Pager: [phone] Office: [phone]

## 2018-11-27 NOTE — TOC Progression Note (Signed)
Transition of Care [facility]) - Progression Note    Patient Details  Name: Donna Mcneil MRN: [id number] Date of Birth: Oct 11, 1940  [facility]) CM/SW Contact  Servando Snare, [place] Phone Number: 11/27/2018, 11:03 AM  Clinical Narrative:   Bed offers given to patient and care giver. LCSW awaiting bed choice.     Expected Discharge Plan: [place] Barriers to Discharge: Continued Medical Work up  Expected Discharge Plan and Services Expected Discharge Plan: [place] arrangements for the past 2 months: Single Family Home Expected Discharge Date: (unknown)                         Social Determinants of Health (SDOH) Interventions    Readmission Risk Interventions Readmission Risk Prevention Plan 11/22/2018  Transportation Screening Complete  Medication Review [employer]) Complete  PCP or Specialist appointment within 3-5 days of discharge Complete  HRI or [place] Complete  SW Recovery Care/Counseling Consult Complete  Palliative Care Screening Not [place] Complete  Some recent data might be hidden

## 2018-11-27 NOTE — Progress Notes (Signed)
Occupational Therapy Treatment Patient Details Name: Donna Mcneil MRN: [id number] DOB: [birth year] Today's Date: 11/27/2018    History of present illness 78 yo female admitted to ED on 3/23 with shortness of breath, on home O2. Pt with stage IV lung cancer. PMH includes COPD exacerbation, diverticulitis, HTN, lupus, OP, ARF with hypoxia, malignant pleural effusion, RA, peripheral neuropathy, anxiety, broncitis, loop sigmoid colectomy.    OT comments  Spoke with RN regarding nasal cannula versus non rebreather with sitting EOB  Follow Up Recommendations  SNF    Equipment Recommendations  None recommended by OT    Recommendations for Other Services      Precautions / Restrictions Precautions Precautions: Fall Precaution Comments: monitor oxygen  Restrictions Weight Bearing Restrictions: No       Mobility Bed Mobility Overal bed mobility: Needs Assistance       Supine to sit: Min assist Sit to supine: Min assist      Transfers Overall transfer level: Needs assistance Equipment used: 2 person hand held assist Transfers: Sit to/from Stand Sit to Stand: +2 safety/equipment;Mod assist              Balance Overall balance assessment: Needs assistance;History of Falls(fall during hospitalization per pt) Sitting-balance support: Feet unsupported;Single extremity supported Sitting balance-Leahy Scale: Fair     Standing balance support: Bilateral upper extremity supported Standing balance-Leahy Scale: Poor(NT)                             ADL either performed or assessed with clinical judgement   ADL   Eating/Feeding: Set up;Sitting                                     General ADL Comments: Needed extraa encouragement to get OOB.  Pt sat EOB for about 36minutes and did stand twice.  Sats decreased to 78 on nasal cannula.  Increased to 88-92 on non rebreather.       Vision Patient Visual Report: No change from baseline               General Comments  Briefly discussed energy conservation and handout provided         Frequency  Min 2X/week        Progress Toward Goals  OT Goals(current goals can now be found in the care plan section)  Progress towards OT goals: Progressing toward goals     Plan Discharge plan remains appropriate       AM-PAC OT "6 Clicks" Daily Activity     Outcome Measure   Help from another person eating meals?: A Little Help from another person taking care of personal grooming?: A Little Help from another person toileting, which includes using toliet, bedpan, or urinal?: A Little Help from another person bathing (including washing, rinsing, drying)?: A Little Help from another person to put on and taking off regular upper body clothing?: A Lot Help from another person to put on and taking off regular lower body clothing?: A Little 6 Click Score: 17    End of Session    OT Visit Diagnosis: Unsteadiness on feet (R26.81);Muscle weakness (generalized) (M62.81);Other abnormalities of gait and mobility (R26.89);History of falling (Z91.81)   Activity Tolerance Patient tolerated treatment well   Patient Left with chair alarm set;in bed   Nurse Communication Mobility status        Time:  6438-3779 OT Time Calculation (min): 17 min  Charges: OT General Charges $OT Visit: 1 Visit OT Treatments $Self Care/Home Management : 8-22 mins  Kari Baars, [place] [phone] Office- [phone], Edwena Felty D 11/27/2018, 1:04 PM

## 2018-11-27 NOTE — Progress Notes (Signed)
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  Patient with a prior tobacco history and quit in January 2020 after her diagnosis of lung cancer.  Patient also presented with increased lower extremity edema with associated nausea.  CT angiogram chest was negative for PE however noticed advanced left lung malignancy, pleural effusion and new extensive bilateral peripheral nodular and heterogeneous airspace opacity and new enlarged paratracheal lymph nodes consistent with infection versus edema.  ID, Dr. Johnnye Sima was curbside by nurse practitioner Forrest Moron the evening of 11/19/2018 and it was felt testing for COVID 19 was not warranted at this time.  Overnight pt was transferred back to step down as she went into afib with RVR.  Palliative care consulted for goals of care.  She is currently on the floor requiring about 5 lit of  oxygen.  CXR this am shows mild pulm edema.    Assessment & Plan:   Principal Problem:   Acute respiratory failure with hypoxia (HCC) Active Problems:   Seropositive rheumatoid arthritis (HCC)   Hyperlipidemia   Hypertension   Cutaneous lupus erythematosus   COPD (chronic obstructive pulmonary disease) (HCC)   Chronic respiratory failure with hypoxia (HCC)   Hyponatremia   Malignant pleural effusion   Adenocarcinoma of left lung, stage 4 (HCC)   Port-A-Cath in place   Pressure injury of skin   Tachycardia   Acute on chronic diastolic CHF (congestive heart failure) (HCC)   Atrial fibrillation with RVR (HCC)  Acute on chronic respiratory failure with hypoxia secondary to acute COPD  exacerbation and acute on chronic diastolic heart failure. Improving.  Respiratory panel is negative influenza PCR is negative pro calcitonin level still elevated.   Urine strep pneumo antigen and legionella antigen is negative. Afebrile overnight, WBC count within normal limits. Started tapering steroids as her wheezing has improved and we will try to wean her oxygen as appropraite. Completed 7 days of IV antibiotics.  Continue with Brovana and Pulmicort. Repeat CXR today shows improvement in the pneumonia but with mild pulmonary edema.    Atrial fibrillation with RVR Rate better today  And in sinus rhythm with  oral metoprolol.  D/c IV cardizem and IV digoxin. She was started on flecainide by cardiology.  Thyroid panel within normal limits.    Anemia of chronic disease Hemoglobin stable around 9.  Continue to monitor and transfuse to keep hemoglobin greater than 7.    Acute on chronic bilateral lower extremity edema probably secondary to mild acute on chronic diastolic heart failure. Resume lasix BID.  Continue with strict intake and output.  Fluid restriction and daily weights.     Stage IV non-small cell lung cancer Patient follows up with Dr. Julien Nordmann.   History of SLE and rheumatoid arthritis Patient was on methotrexate 15 mg, which is on hold for now    History of tobacco use disorder Patient recently quit smoking in January 2020.   Hypertension Well controlled resume with Norvasc and Lasix   Hyperlipidemia resume statin   PT recommended SNF, she initially refused SNF, but she is not stable for discharge home at this time.  SW consulted and we are  waiting for a bed at SNF for discharge.    DVT prophylaxis: Eliquis Code Status: DNR Family Communication: None at bedside Disposition Plan: d/c to SNF when bed is available.    Consultants:   Cardiology  Infectious disease  Procedures: None  Antimicrobials: Rocephin and Zithromax   Subjective:  Stable on  5 lit of [place] oxygen. No chest pain. Anxious.   Objective: Vitals:   11/27/18 0839 11/27/18 0855 11/27/18 1044 11/27/18 1426  BP:   (!) 146/50   Pulse:   68   Resp:      Temp:      TempSrc:      SpO2: (!) 83% 95%  92%  Weight:      Height:        Intake/Output Summary (Last 24 hours) at 11/27/2018 1453 Last data filed at 11/27/2018 0441 Gross per 24 hour  Intake 10 ml  Output -  Net 10 ml   Filed Weights   11/24/18 0600 11/25/18 0500 11/26/18 0543  Weight: 60.7 kg 60.5 kg 61.2 kg    Examination:  General exam: no distress on 5 lit of [place] oxygen.  Respiratory system: diminished air entry, tachypnea Cardiovascular system: S1 & S2 heard, regular.  Gastrointestinal system: Abdomen is soft NT nd bs+ Central nervous system: Alert and oriented. No focal neurological deficits. Extremities: edema improving.  Skin: No rashes, lesions or ulcers Psychiatry: . Mood & affect appropriate.     Data Reviewed: I have personally reviewed following labs and imaging studies  CBC: Recent Labs  Lab 11/21/18 0557 11/22/18 0355  WBC 6.3 9.1  HGB 9.1* 9.1*  HCT 28.1* 28.2*  MCV 94.9 95.6  PLT 609* 242*   Basic Metabolic Panel: Recent Labs  Lab 11/21/18 0557 11/22/18 0355 11/23/18 0936 11/24/18 0514  NA 133* 132* 133* 137  K 3.7 3.8 3.4* 3.8  CL 95* 95* 94* 97*  CO2 28 26 30  33*  GLUCOSE 158* 150* 248* 91  BUN 35* 44* 34* 29*  CREATININE 0.93 0.98 0.70 0.65  CALCIUM 7.9* 7.7* 7.5* 7.8*  MG  --  2.4  --   --    GFR: Estimated Creatinine Clearance: 49.9 mL/min (by C-G formula based on SCr of 0.65 mg/dL). Liver Function Tests: No results for input(s): AST, ALT, ALKPHOS, BILITOT, PROT, ALBUMIN in the last 168 hours. No results for input(s): LIPASE, AMYLASE in the last 168 hours. No results for input(s): AMMONIA in the last 168 hours. Coagulation Profile: Recent Labs  Lab 11/21/18 0557  INR 1.2   Cardiac Enzymes: Recent Labs  Lab 11/20/18 1645 11/20/18  2252 11/25/18 2058 11/26/18 0259 11/26/18 0900  TROPONINI 0.03* 0.04* 0.03* 0.03* <0.03   BNP (last 3 results) No results for input(s): PROBNP in the last 8760 hours. HbA1C: No results for input(s): HGBA1C in the last 72 hours. CBG: No results for input(s): GLUCAP in the last 168 hours. Lipid Profile: No results for input(s): CHOL, HDL, LDLCALC, TRIG, CHOLHDL, LDLDIRECT in the last 72 hours. Thyroid Function Tests: No results for input(s): TSH, T4TOTAL, FREET4, T3FREE, THYROIDAB in the last 72 hours. Anemia Panel: No results for input(s): VITAMINB12, FOLATE, FERRITIN, TIBC, IRON, RETICCTPCT in the last 72 hours. Sepsis Labs: Recent Labs  Lab 11/21/18 0557  PROCALCITON 34.54    Recent Results (from the past 240 hour(s))  Blood culture (routine x 2)     Status: None   Collection Time: 11/19/18 12:46 PM  Result Value Ref Range Status   Specimen Description  Final    BLOOD PORTA CATH RIGHT SIDE Performed at [facility] [address], [place], [place] [address]    Special Requests   Final    BOTTLES DRAWN AEROBIC AND ANAEROBIC Blood Culture adequate volume Performed at [facility] [address] [place], [place] [address]    Culture   Final    NO GROWTH 5 DAYS Performed at [facility], [place] [address], [place], [place] [address]    Report Status 11/24/2018 FINAL  Final  Blood culture (routine x 2)     Status: None   Collection Time: 11/19/18 12:52 PM  Result Value Ref Range Status   Specimen Description   Final    BLOOD RIGHT ANTECUBITAL Performed at [facility] [address], [place], [place] [address]    Special Requests   Final    BOTTLES DRAWN AEROBIC AND ANAEROBIC Blood Culture adequate volume Performed at [facility] [address] [place], [place] [address]    Culture   Final    NO GROWTH 5 DAYS Performed at [facility], [place] [address], [place], [place]  [address]    Report Status 11/24/2018 FINAL  Final  Respiratory Panel by PCR     Status: None   Collection Time: 11/19/18  9:13 PM  Result Value Ref Range Status   Adenovirus NOT DETECTED NOT DETECTED Final   Coronavirus 229E NOT DETECTED NOT DETECTED Final    Comment: (NOTE) The Coronavirus on the Respiratory Panel, DOES NOT test for the novel  Coronavirus (2019 nCoV)    Coronavirus HKU1 NOT DETECTED NOT DETECTED Final   Coronavirus NL63 NOT DETECTED NOT DETECTED Final   Coronavirus OC43 NOT DETECTED NOT DETECTED Final   Metapneumovirus NOT DETECTED NOT DETECTED Final   Rhinovirus / Enterovirus NOT DETECTED NOT DETECTED Final   Influenza A NOT DETECTED NOT DETECTED Final   Influenza B NOT DETECTED NOT DETECTED Final   Parainfluenza Virus 1 NOT DETECTED NOT DETECTED Final   Parainfluenza Virus 2 NOT DETECTED NOT DETECTED Final   Parainfluenza Virus 3 NOT DETECTED NOT DETECTED Final   Parainfluenza Virus 4 NOT DETECTED NOT DETECTED Final   Respiratory Syncytial Virus NOT DETECTED NOT DETECTED Final   Bordetella pertussis NOT DETECTED NOT DETECTED Final   Chlamydophila pneumoniae NOT DETECTED NOT DETECTED Final   Mycoplasma pneumoniae NOT DETECTED NOT DETECTED Final    Comment: Performed at [facility], [place]. [address], [place], [place] [address]  MRSA PCR Screening     Status: None   Collection Time: 11/19/18  9:13 PM  Result Value Ref Range Status   MRSA by PCR NEGATIVE NEGATIVE Final    Comment:        The GeneXpert MRSA Assay (FDA approved for NASAL specimens only), is one component of a comprehensive MRSA colonization surveillance program. It is not intended to diagnose MRSA infection nor to guide or monitor treatment for MRSA infections. Performed at [facility], [place] [address], [place], [place] [address]   Culture, Urine     Status: None   Collection Time: 11/20/18  8:00 AM  Result Value Ref Range Status   Specimen Description   Final     URINE, RANDOM Performed at [facility] [address] [place], [place] [address]    Special Requests   Final    NONE Performed at [facility], [place] [address] [place], [place]  [address]    Culture   Final    NO GROWTH Performed at [facility], [place] [address] [place], [place] [address]    Report Status 11/21/2018 FINAL  Final  Culture, sputum-assessment     Status: None   Collection Time: 11/20/18 11:37 AM  Result Value Ref Range Status   Specimen Description SPUTUM  Final   Special Requests Immunocompromised  Final   Sputum evaluation   Final    THIS SPECIMEN IS ACCEPTABLE FOR SPUTUM CULTURE Performed at [facility], [place] [address], [place], [place] [address]    Report Status 11/20/2018 FINAL  Final  Culture, respiratory     Status: None   Collection Time: 11/20/18 11:37 AM  Result Value Ref Range Status   Specimen Description   Final    SPUTUM Performed at [facility] [address] [place], [place] [address]    Special Requests   Final    Immunocompromised Reflexed from [id number] Performed at [facility], [place] [address], [place], [place] [address]    Gram Stain   Final    ABUNDANT WBC PRESENT, PREDOMINANTLY PMN FEW GRAM POSITIVE COCCI FEW GRAM POSITIVE RODS    Culture   Final    FEW Consistent with normal respiratory flora. Performed at [facility], [place] [address], [place], [place] [address]    Report Status 11/22/2018 FINAL  Final         Radiology Studies: Dg Chest 1 View  Result Date: 11/27/2018 CLINICAL DATA:  Hypoxia, history of hypertension EXAM: CHEST  1 VIEW COMPARISON:  11/25/2018 FINDINGS: There is bilateral diffuse mild interstitial thickening. There bilateral small pleural effusions, left greater than right. There is no pneumothorax. The heart mediastinum are stable. There is thoracic aortic atherosclerosis. There is a right-sided  Port-A-Cath in satisfactory position. The osseous structures are unremarkable. IMPRESSION: 1. Findings concerning for mild pulmonary edema. Electronically Signed   By: Kathreen Devoid   On: 11/27/2018 11:04        Scheduled Meds: . apixaban  2.5 mg Oral BID  . atorvastatin  40 mg Oral Q2000  . chlorhexidine  15 mL Mouth Rinse BID  . feeding supplement (ENSURE ENLIVE)  237 mL Oral BID BM  . flecainide  50 mg Oral Q12H  . fluticasone  2 spray Each Nare Daily  . folic acid  1 mg Oral Daily  . furosemide  20 mg Oral BID  . gabapentin  100 mg Oral TID  . guaiFENesin  1,200 mg Oral BID  . ipratropium  2 puff Inhalation TID  . levalbuterol  2 puff Inhalation TID  . loratadine  10 mg Oral Daily  . mouth rinse  15 mL Mouth Rinse q12n4p  . metoprolol tartrate  100 mg Oral BID  . multivitamin with minerals  1 tablet Oral Daily  . pantoprazole  40 mg Oral Q0600  . sodium chloride flush  10-40 mL Intracatheter Q12H  . umeclidinium-vilanterol  1 puff Inhalation Daily   Continuous Infusions: . sodium chloride 10 mL/hr at 11/24/18 0011     LOS: 8 days    Time spent: 26 minutes.    Hosie Poisson, MD [facility] Pager [phone]  If 7PM-7AM, please contact night-coverage [email or web address] Password [facility] 11/27/2018, 2:53 PM

## 2018-11-27 NOTE — Progress Notes (Signed)
Nutrition Follow-up  INTERVENTION:   -Continue Ensure Enlive po BID, each supplement provides 350 kcal and 20 grams of protein -Continue Multivitamin with minerals daily  NUTRITION DIAGNOSIS:   Increased nutrient needs related to acute illness, chronic illness, cancer and cancer related treatments as evidenced by estimated needs.  Ongoing.  GOAL:   Patient will meet greater than or equal to 90% of their needs  Progressing.  MONITOR:   PO intake, Supplement acceptance, Weight trends, Labs, Skin  REASON FOR ASSESSMENT:   Malnutrition Screening Tool, Consult Assessment of nutrition requirement/status  ASSESSMENT:   78 year old female history of stage IV NSCLC on chemotherapy, lupus/rheumatoid arthritis, chronic hypoxic respiratory failure on 2-3 L [place], and COPD. She presented to the ED with SOB, productive cough, low-grade fevers x5 days, increased lower extremity edema, and nausea.  Patient with a prior tobacco history and quit in January 2020 after her diagnosis of lung cancer. CT angiogram chest was negative for PE however noticed advanced left lung malignancy, pleural effusion, and new extensive bilateral peripheral nodular and heterogeneous airspace opacity and new enlarged paratracheal lymph nodes consistent with infection versus edema.  **RD working remotely**  Per chart review, pt has been ordering meals but PO intakes have not been recorded. Pt is drinking at least 1 Ensure daily, BID is ordered. Patient awaiting discharge home with home health.  Pt's weight is now +9 lb since admission on 3/23.  Labs reviewed.  Medications:  Folic acid tablet daily, Lasix tablet BID, Multivitamin with minerals daily  Diet Order:   Diet Order            Diet heart healthy/carb modified Room service appropriate? Yes; Fluid consistency: Thin; Fluid restriction: 1500 mL Fluid  Diet effective now              EDUCATION NEEDS:   No education needs have been identified at this  time  Skin:  Skin Assessment: Skin Integrity Issues: Skin Integrity Issues:: Other (Comment) Other: tiny, full thickness non-pressure wound to R buttocks  Last BM:  3/28  Height:   Ht Readings from Last 1 Encounters:  11/19/18 5\' 2"  (1.575 m)    Weight:   Wt Readings from Last 1 Encounters:  11/26/18 61.2 kg    Ideal Body Weight:  50 kg  BMI:  Body mass index is 24.68 kg/m.  Estimated Nutritional Needs:   Kcal:  1830-2000 kcal  Protein:  80-90 grams  Fluid:  >/= 1.8 L/day  Clayton Bibles, MS, RD, LDN [place] Dietitian Pager: [phone] After Hours Pager: [phone]

## 2018-11-27 NOTE — TOC Progression Note (Signed)
Transition of Care [facility]) - Progression Note    Patient Details  Name: Donna Mcneil MRN: [id number] Date of Birth: February 27, 1941  Transition of Care [facility]) CM/SW Contact  Servando Snare, [place] Phone Number: 11/27/2018, 3:37 PM  Clinical Narrative:   Patient choose bed at [facility]. Patient needs to complete paperwork. Facility started [employer].     Expected Discharge Plan: [place] Barriers to Discharge: Continued Medical Work up  Expected Discharge Plan and Services Expected Discharge Plan: [place] arrangements for the past 2 months: Single Family Home Expected Discharge Date: (unknown)                         Social Determinants of Health (SDOH) Interventions    Readmission Risk Interventions Readmission Risk Prevention Plan 11/22/2018  Transportation Screening Complete  Medication Review [employer]) Complete  PCP or Specialist appointment within 3-5 days of discharge Complete  HRI or [place] Complete  SW Recovery Care/Counseling Consult Complete  Palliative Care Screening Not [place] Complete  Some recent data might be hidden

## 2018-11-28 ENCOUNTER — Inpatient Hospital Stay: Payer: Medicare Other

## 2018-11-28 ENCOUNTER — Encounter (HOSPITAL_COMMUNITY): Payer: Self-pay | Admitting: Infectious Disease

## 2018-11-28 DIAGNOSIS — I10 Essential (primary) hypertension: Secondary | ICD-10-CM

## 2018-11-28 DIAGNOSIS — E782 Mixed hyperlipidemia: Secondary | ICD-10-CM

## 2018-11-28 LAB — CBC WITH DIFFERENTIAL/PLATELET
Abs Immature Granulocytes: 0.18 10*3/uL — ABNORMAL HIGH (ref 0.00–0.07)
Basophils Absolute: 0 10*3/uL (ref 0.0–0.1)
Basophils Relative: 0 %
Eosinophils Absolute: 0 10*3/uL (ref 0.0–0.5)
Eosinophils Relative: 0 %
HCT: 33 % — ABNORMAL LOW (ref 36.0–46.0)
Hemoglobin: 10.3 g/dL — ABNORMAL LOW (ref 12.0–15.0)
Immature Granulocytes: 1 %
Lymphocytes Relative: 6 %
Lymphs Abs: 1.1 10*3/uL (ref 0.7–4.0)
MCH: 31.1 pg (ref 26.0–34.0)
MCHC: 31.2 g/dL (ref 30.0–36.0)
MCV: 99.7 fL (ref 80.0–100.0)
Monocytes Absolute: 1.6 10*3/uL — ABNORMAL HIGH (ref 0.1–1.0)
Monocytes Relative: 9 %
Neutro Abs: 15.8 10*3/uL — ABNORMAL HIGH (ref 1.7–7.7)
Neutrophils Relative %: 84 %
Platelets: 331 10*3/uL (ref 150–400)
RBC: 3.31 MIL/uL — ABNORMAL LOW (ref 3.87–5.11)
RDW: 19.6 % — ABNORMAL HIGH (ref 11.5–15.5)
WBC: 18.7 10*3/uL — ABNORMAL HIGH (ref 4.0–10.5)
nRBC: 0 % (ref 0.0–0.2)

## 2018-11-28 LAB — NOVEL CORONAVIRUS, NAA (HOSP ORDER, SEND-OUT TO REF LAB; TAT 18-24 HRS): SARS-CoV-2, NAA: NOT DETECTED

## 2018-11-28 LAB — BASIC METABOLIC PANEL
Anion gap: 11 (ref 5–15)
BUN: 13 mg/dL (ref 8–23)
CO2: 34 mmol/L — ABNORMAL HIGH (ref 22–32)
Calcium: 8 mg/dL — ABNORMAL LOW (ref 8.9–10.3)
Chloride: 91 mmol/L — ABNORMAL LOW (ref 98–111)
Creatinine, Ser: 0.52 mg/dL (ref 0.44–1.00)
GFR calc Af Amer: >60 mL/min (ref >60)
GFR calc non Af Amer: >60 mL/min (ref >60)
Glucose, Bld: 92 mg/dL (ref 70–99)
Potassium: 3.6 mmol/L (ref 3.5–5.1)
Sodium: 136 mmol/L (ref 135–145)

## 2018-11-28 MED ORDER — FUROSEMIDE 10 MG/ML IJ SOLN
20.0000 mg | Freq: Two times a day (BID) | INTRAMUSCULAR | Status: AC
  Administered 2018-11-28 (×2): 20 mg via INTRAVENOUS
  Filled 2018-11-28 (×2): qty 2

## 2018-11-28 MED ORDER — FUROSEMIDE 20 MG PO TABS
20.0000 mg | ORAL_TABLET | Freq: Two times a day (BID) | ORAL | Status: DC
  Administered 2018-11-29: 20 mg via ORAL
  Filled 2018-11-28: qty 1

## 2018-11-28 NOTE — Progress Notes (Signed)
Physical Therapy Treatment Patient Details Name: Donna Mcneil MRN: [id number] DOB: 19-Aug-1941 Today's Date: 11/28/2018    History of Present Illness 78 yo female admitted to ED on 3/23 with shortness of breath, on home O2. Pt with stage IV lung cancer. PMH includes COPD exacerbation, diverticulitis, HTN, lupus, OP, ARF with hypoxia, malignant pleural effusion, RA, peripheral neuropathy, anxiety, broncitis, loop sigmoid colectomy.     PT Comments    Pt declined OOB activity on today. She was agreeable to bed level exercises. O2 sat >90% on HFNC throughout session. Will continue to follow and progress activity as pt will allow.    Follow Up Recommendations  SNF     Equipment Recommendations  (TBD at next venue)    Recommendations for Other Services       Precautions / Restrictions Precautions Precautions: Fall Precaution Comments: monitor oxygen. Pt now on high flow oxygen Restrictions Weight Bearing Restrictions: No    Mobility  Bed Mobility               General bed mobility comments: NT-pt declined OOB activity  Transfers                    Ambulation/Gait                 Stairs             Wheelchair Mobility    Modified Rankin (Stroke Patients Only)       Balance                                            Cognition Arousal/Alertness: Awake/alert Behavior During Therapy: WFL for tasks assessed/performed Overall Cognitive Status: Within Functional Limits for tasks assessed                                        Exercises    General Exercises - Lower Extremity Ankle Circles/Pumps: AROM;Both;15 reps;Supine Gluteal Sets: AROM;10 reps;Supine Short Arc Quad: AROM;Both;10 reps;Supine Hip ABduction/ADduction: AROM;Both;10 reps;Supine    General Comments        Pertinent Vitals/Pain Pain Assessment: No/denies pain Pain Score: 1  Pain Location: sore shoulders Pain Descriptors /  Indicators: Sore Pain Intervention(s): Limited activity within patient's tolerance;Monitored during session;Repositioned    Home Living                      Prior Function            PT Goals (current goals can now be found in the care plan section) Progress towards PT goals: Progressing toward goals    Frequency    Min 2X/week      PT Plan Current plan remains appropriate    Co-evaluation              AM-PAC PT "6 Clicks" Mobility   Outcome Measure  Help needed turning from your back to your side while in a flat bed without using bedrails?: A Little Help needed moving from lying on your back to sitting on the side of a flat bed without using bedrails?: A Little Help needed moving to and from a bed to a chair (including a wheelchair)?: A Lot Help needed standing up from a chair using your arms (e.g., wheelchair or  bedside chair)?: A Lot Help needed to walk in hospital room?: A Lot Help needed climbing 3-5 steps with a railing? : Total 6 Click Score: 13    End of Session Equipment Utilized During Treatment: Oxygen Activity Tolerance: Patient limited by fatigue Patient left: in bed;with bed alarm set;with call bell/phone within reach   PT Visit Diagnosis: Muscle weakness (generalized) (M62.81);Other abnormalities of gait and mobility (R26.89)     Time: 9539-6728 PT Time Calculation (min) (ACUTE ONLY): 9 min  Charges:  $Therapeutic Exercise: 8-22 mins                        Weston Anna, PT Acute Rehabilitation Services Pager: [phone] Office: [phone]

## 2018-11-28 NOTE — Progress Notes (Signed)
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 Donna Mcneil with a prior tobacco history and quit in January 2020 after her diagnosis of lung cancer. Donna Mcneil also presented with increased lower extremity edema with associated nausea. CT angiogram chest was negative for PE however noticed advanced left lung malignancy, pleural effusion and new extensive bilateral peripheral nodular and heterogeneous airspace opacity and new enlarged paratracheal lymph nodes consistent with infection versus edema. ID, Dr. Johnnye Sima was curbside by nurse practitioner Forrest Moron the evening of 11/19/2018 and it was felt testing for COVID19 was not warranted at this time.  Overnight pt was transferred back to step down as she went into afib with RVR.  Palliative care consulted for goals of care.  She is currently on the floor requiring about 5 lit of [place] oxygen.  CXR  showed mild pulm edema.    Assessment & Plan:   Principal Problem:   Acute respiratory failure with hypoxia (HCC) Active Problems:   Seropositive rheumatoid arthritis (HCC)   Hyperlipidemia   Hypertension   Cutaneous lupus erythematosus   COPD (chronic obstructive pulmonary disease) (HCC)   Chronic respiratory failure with hypoxia (HCC)   Hyponatremia   Malignant pleural effusion   Adenocarcinoma of left lung, stage 4 (HCC)   Port-A-Cath in place   Pressure injury of skin   Tachycardia   Acute on chronic diastolic CHF (congestive heart failure) (HCC)   Atrial fibrillation with RVR ([place])  #1 acute on chronic respiratory failure with hypoxia secondary to acute COPD  exacerbation and acute on chronic diastolic heart failure +/-stage IV non-small cell lung cancer Donna Mcneil with complaints of shortness of breath on minimal exertion.  Donna Mcneil noted to be on 13 L high flow nasal cannula with sats of 100%.  Respiratory viral panel was negative.  Urine strep pneumococcus antigen negative.  Urine Legionella antigen negative.  Chest x-ray done on 11/27/2018 consistent with mild pulmonary edema.  Donna Mcneil currently on oral Lasix.  Will place on Lasix 20 mg IV every 12 hours for the next 24 hours and monitor strict I's and O's.  Status post full course of antibiotic treatment.  Continue prednisone taper, MDI, claritin, PPI,  2.  New onset A. fib with RVR/paroxysmal atrial fibrillation CHA2DS2VASC SCORE 4 Donna Mcneil noted to have new onset A. fib with RVR during the hospitalization likely triggered by problem #1.  TSH within normal limits.  Cardiology consulted and Donna Mcneil was on IV Cardizem and IV digoxin.  Rate improved and Donna Mcneil in sinus rhythm.  Continue metoprolol for rate control.  Continue flecainide.  Eliquis for anticoagulation.  Monitor closely for bleeding.  Outpatient follow-up with cardiology.  3.  Demand ischemia Donna Mcneil noted to have a flat troponins in the setting of paroxysmal atrial fibrillation.  Donna Mcneil seen by cardiology and it was felt this was not secondary to acute coronary symptoms.  Continue current medical management.  Follow.  4.  Stage IV non-small cell lung cancer Outpatient follow-up with oncology.  5.  History of SLE and rheumatoid arthritis Donna Mcneil was on methotrexate which is on hold and likely resume on discharge.  6.  History of tobacco use Donna Mcneil  quit smoking in January 2020.  Follow.  7.  Hypertension Blood pressure currently stable.  Continue Lasix, Lopressor.  8.  Hyperlipidemia Continue statin.   DVT prophylaxis: Eliquis Code Status: DNR Family Communication: updated Donna Mcneil. No family at bedside. Disposition Plan: SNF when  bed available and when oxygen requirements have improved.   Consultants:   Cardiology: Dr. Johnsie Cancel 11/20/2018  Palliative care: Dr. Domingo Cocking 11/25/2018  Procedures:  CT angiogram chest 11/19/2018 Chest x-ray 11/19/2018, 11/21/2018, 11/25/2018, 11/27/2018    Antimicrobials:  IV Rocephin 11/19/2018>>>>11/25/2018  Oral azithromycin 11/19/2018>>>>>11/26/2018   Subjective: Donna Mcneil with complaints of shortness of breath on minimal exertion.  Denies any chest pain.  Denies any significant cough.  Objective: Vitals:   11/27/18 2051 11/27/18 2136 11/28/18 0543 11/28/18 0930  BP: (!) 174/60 124/63 (!) 158/86   Pulse: (!) 54 (!) 51 (!) 52   Resp: 17 16 13    Temp: 97.6 F (36.4 C) 97.9 F (36.6 C) 97.6 F (36.4 C)   TempSrc: Oral Oral Oral   SpO2: 98% 96% 100% 98%  Weight:      Height:        Intake/Output Summary (Last 24 hours) at 11/28/2018 1040 Last data filed at 11/28/2018 0300 Gross per 24 hour  Intake 20 ml  Output -  Net 20 ml   Filed Weights   11/24/18 0600 11/25/18 0500 11/26/18 0543  Weight: 60.7 kg 60.5 kg 61.2 kg    Examination:  General exam: Appears calm and comfortable  Respiratory system: Decreased breath sounds in the bases.  No wheezing noted.  Poor to fair air movement.  Respiratory effort normal. Cardiovascular system: S1 & S2 heard, RRR. No JVD, murmurs, rubs, gallops or clicks.  Trace pedal edema. Gastrointestinal system: Abdomen is nondistended, soft and nontender. No organomegaly or masses felt. Normal bowel sounds heard. Central nervous system: Alert and oriented. No focal neurological deficits. Extremities: Symmetric 5 x 5 power. Skin: No rashes, lesions or ulcers Psychiatry: Judgement and insight appear normal. Mood & affect appropriate.     Data Reviewed: I have personally reviewed following labs and imaging studies  CBC: Recent Labs  Lab 11/22/18 0355 11/28/18 0952  WBC 9.1 18.7*  NEUTROABS  --  15.8*  HGB 9.1* 10.3*  HCT 28.2* 33.0*  MCV  95.6 99.7  PLT 619* 106   Basic Metabolic Panel: Recent Labs  Lab 11/22/18 0355 11/23/18 0936 11/24/18 0514 11/28/18 0952  NA 132* 133* 137 136  K 3.8 3.4* 3.8 3.6  CL 95* 94* 97* 91*  CO2 26 30 33* 34*  GLUCOSE 150* 248* 91 92  BUN 44* 34* 29* 13  CREATININE 0.98 0.70 0.65 0.52  CALCIUM 7.7* 7.5* 7.8* 8.0*  MG 2.4  --   --   --    GFR: Estimated Creatinine Clearance: 49.9 mL/min (by C-G formula based on SCr of 0.52 mg/dL). Liver Function Tests: No results for input(s): AST, ALT, ALKPHOS, BILITOT, PROT, ALBUMIN in the last 168 hours. No results for input(s): LIPASE, AMYLASE in the last 168 hours. No results for input(s): AMMONIA in the last 168 hours. Coagulation Profile: No results for input(s): INR, PROTIME in the last 168 hours. Cardiac Enzymes: Recent Labs  Lab 11/25/18 2058 11/26/18 0259 11/26/18 0900  TROPONINI 0.03* 0.03* <0.03   BNP (last 3 results) No results for input(s): PROBNP in the last 8760 hours. HbA1C: No results for input(s): HGBA1C in the last 72 hours. CBG: No results for input(s): GLUCAP in the last 168 hours. Lipid Profile:  No results for input(s): CHOL, HDL, LDLCALC, TRIG, CHOLHDL, LDLDIRECT in the last 72 hours. Thyroid Function Tests: No results for input(s): TSH, T4TOTAL, FREET4, T3FREE, THYROIDAB in the last 72 hours. Anemia Panel: No results for input(s): VITAMINB12, FOLATE, FERRITIN, TIBC, IRON, RETICCTPCT in the last 72 hours. Sepsis Labs: No results for input(s): PROCALCITON, LATICACIDVEN in the last 168 hours.  Recent Results (from the past 240 hour(s))  Blood culture (routine x 2)     Status: None   Collection Time: 11/19/18 12:46 PM  Result Value Ref Range Status   Specimen Description   Final    BLOOD PORTA CATH RIGHT SIDE Performed at [facility] [address], [place], [place] [address]    Special Requests   Final    BOTTLES DRAWN AEROBIC AND ANAEROBIC Blood Culture adequate volume Performed at  [facility] [address] [place], [place] [address]    Culture   Final    NO GROWTH 5 DAYS Performed at [facility], [place] [address] [place], [place] [address]    Report Status 11/24/2018 FINAL  Final  Blood culture (routine x 2)     Status: None   Collection Time: 11/19/18 12:52 PM  Result Value Ref Range Status   Specimen Description   Final    BLOOD RIGHT ANTECUBITAL Performed at [facility] [address], [place], [place] [address]    Special Requests   Final    BOTTLES DRAWN AEROBIC AND ANAEROBIC Blood Culture adequate volume Performed at [facility] [address] [place], [place] [address]    Culture   Final    NO GROWTH 5 DAYS Performed at [facility], [place] [address], [place], [place] [address]    Report Status 11/24/2018 FINAL  Final  Respiratory Panel by PCR     Status: None   Collection Time: 11/19/18  9:13 PM  Result Value Ref Range Status   Adenovirus NOT DETECTED NOT DETECTED Final   Coronavirus 229E NOT DETECTED NOT DETECTED Final    Comment: (NOTE) The Coronavirus on the Respiratory Panel, DOES NOT test for the novel  Coronavirus (2019 nCoV)    Coronavirus HKU1 NOT DETECTED NOT DETECTED Final   Coronavirus NL63 NOT DETECTED NOT DETECTED Final   Coronavirus OC43 NOT DETECTED NOT DETECTED Final   Metapneumovirus NOT DETECTED NOT DETECTED Final   Rhinovirus / Enterovirus NOT DETECTED NOT DETECTED Final   Influenza A NOT DETECTED NOT DETECTED Final   Influenza B NOT DETECTED NOT DETECTED Final   Parainfluenza Virus 1 NOT DETECTED NOT DETECTED Final   Parainfluenza Virus 2 NOT DETECTED NOT DETECTED Final   Parainfluenza Virus 3 NOT DETECTED NOT DETECTED Final   Parainfluenza Virus 4 NOT DETECTED NOT DETECTED Final   Respiratory Syncytial Virus NOT DETECTED NOT DETECTED Final   Bordetella pertussis NOT DETECTED NOT DETECTED Final   Chlamydophila pneumoniae NOT DETECTED NOT DETECTED  Final   Mycoplasma pneumoniae NOT DETECTED NOT DETECTED Final    Comment: Performed at [facility], [place]. [address] [place],  [address]  MRSA PCR Screening     Status: None   Collection Time: 11/19/18  9:13 PM  Result Value Ref Range Status   MRSA by PCR NEGATIVE NEGATIVE Final    Comment:        The GeneXpert MRSA Assay (FDA approved for NASAL specimens only), is one component of a comprehensive MRSA colonization surveillance program. It is  not intended to diagnose MRSA infection nor to guide or monitor treatment for MRSA infections. Performed at [facility], [place] [address] [place], [place] [address]   Culture, Urine     Status: None   Collection Time: 11/20/18  8:00 AM  Result Value Ref Range Status   Specimen Description   Final    URINE, RANDOM Performed at [facility] [address] [place], [place] [address]    Special Requests   Final    NONE Performed at [facility], [place] [address] [place], [place] [address]    Culture   Final    NO GROWTH Performed at [facility], [place] [address], [place], [place] [address]    Report Status 11/21/2018 FINAL  Final  Culture, sputum-assessment     Status: None   Collection Time: 11/20/18 11:37 AM  Result Value Ref Range Status   Specimen Description SPUTUM  Final   Special Requests Immunocompromised  Final   Sputum evaluation   Final    THIS SPECIMEN IS ACCEPTABLE FOR SPUTUM CULTURE Performed at [facility], [place] [address] [place], [place] [address]    Report Status 11/20/2018 FINAL  Final  Culture, respiratory     Status: None   Collection Time: 11/20/18 11:37 AM  Result Value Ref Range Status   Specimen Description   Final    SPUTUM Performed at [facility] [address] [place], [place] [address]    Special Requests   Final    Immunocompromised Reflexed from [id number] Performed at [facility], [place] [address] [place], [place] [address]    Gram Stain   Final    ABUNDANT WBC PRESENT, PREDOMINANTLY PMN FEW GRAM POSITIVE COCCI FEW GRAM POSITIVE RODS    Culture   Final    FEW Consistent with normal respiratory flora. Performed at [facility], [place] [address] [place],  [address]    Report Status 11/22/2018 FINAL  Final         Radiology Studies: Dg Chest 1 View  Result Date: 11/27/2018 CLINICAL DATA:  Hypoxia, history of hypertension EXAM: CHEST  1 VIEW COMPARISON:  11/25/2018 FINDINGS: There is bilateral diffuse mild interstitial thickening. There bilateral small pleural effusions, left greater than right. There is no pneumothorax. The heart mediastinum are stable. There is thoracic aortic atherosclerosis. There is a right-sided Port-A-Cath in satisfactory position. The osseous structures are unremarkable. IMPRESSION: 1. Findings concerning for mild pulmonary edema. Electronically Signed   By: Kathreen Devoid   On: 11/27/2018 11:04        Scheduled Meds: . apixaban  2.5 mg Oral BID  . atorvastatin  40 mg Oral Q2000  . chlorhexidine  15 mL Mouth Rinse BID  . feeding supplement (ENSURE ENLIVE)  237 mL Oral BID BM  . flecainide  50 mg Oral Q12H  . fluticasone  2 spray Each Nare Daily  . folic acid  1 mg Oral Daily  . furosemide  20 mg Oral BID  . gabapentin  100 mg Oral TID  . guaiFENesin  1,200 mg Oral BID  . ipratropium  2 puff Inhalation TID  . levalbuterol  2 puff Inhalation TID  . loratadine  10 mg Oral Daily  . mouth rinse  15 mL Mouth Rinse q12n4p  . metoprolol tartrate  100 mg Oral BID  . multivitamin with minerals  1 tablet Oral Daily  . pantoprazole  40 mg  Oral Q0600  . predniSONE  30 mg Oral QAC breakfast  . sodium chloride flush  10-40 mL Intracatheter Q12H  . umeclidinium-vilanterol  1 puff Inhalation Daily   Continuous Infusions: . sodium chloride Stopped (11/27/18 1949)     LOS: 9 days    Time spent: 40  minutes.    Irine Seal, MD [facility]  If 7PM-7AM, please contact night-coverage [email or web address] 11/28/2018, 10:40 AM

## 2018-11-28 NOTE — Consult Note (Signed)
   [facility] CM Inpatient Consult   11/28/2018  Donna Mcneil 1940/10/20 [id number]   Patient chart review for potential [facility] Care Management services due to unplanned readmission risk score of 42%, extreme.  Chart review reveals current disposition plan is for SNF. No [facility] CM needs at this time.  Netta Cedars, MSN, [facility] Liaison Nurse Mobile Phone [phone]  Toll free office [phone]

## 2018-11-28 NOTE — Progress Notes (Signed)
Occupational Therapy Treatment Patient Details Name: Donna Mcneil MRN: [id number] DOB: May 18, 1941 Today's Date: 11/28/2018    History of present illness 78 yo female admitted to ED on 3/23 with shortness of breath, on home O2. Pt with stage IV lung cancer. PMH includes COPD exacerbation, diverticulitis, HTN, lupus, OP, ARF with hypoxia, malignant pleural effusion, RA, peripheral neuropathy, anxiety, broncitis, loop sigmoid colectomy.       Follow Up Recommendations  SNF    Equipment Recommendations  None recommended by OT    Recommendations for Other Services      Precautions / Restrictions Precautions Precautions: Fall Precaution Comments: monitor oxygen. Pt now on high flow oxygen Restrictions Weight Bearing Restrictions: No              ADL either performed or assessed with clinical judgement   ADL Overall ADL's : Needs assistance/impaired                                       General ADL Comments: Pt agreed to BUE HEP as she stated arms felt sore.  Pt declined OOB     Vision Baseline Vision/History: No visual deficits            Cognition Arousal/Alertness: Awake/alert Behavior During Therapy: WFL for tasks assessed/performed Overall Cognitive Status: Within Functional Limits for tasks assessed                                          Exercises General Exercises - Upper Extremity Shoulder Flexion: AROM;Both;15 reps;Supine Shoulder ABduction: AROM;Both;Supine;15 reps Shoulder Horizontal ABduction: AROM;Both;Supine;15 reps Shoulder Horizontal ADduction: AROM;Both;15 reps;Supine Elbow Flexion: AROM;Both;15 reps;Supine Elbow Extension: AROM;Both;15 reps;Supine Wrist Flexion: AROM;Both;15 reps Wrist Extension: AROM;Both;15 reps Digit Composite Flexion: AROM;Both;15 reps Composite Extension: AROM;Both;15 reps           Pertinent Vitals/ Pain       Pain Score: 1  Pain Location: sore shoulders Pain Descriptors /  Indicators: Sore Pain Intervention(s): Limited activity within patient's tolerance;Monitored during session;Repositioned         Frequency  Min 2X/week        Progress Toward Goals  OT Goals(current goals can now be found in the care plan section)  Progress towards OT goals: Progressing toward goals(new goal added for BUE HEP)  ADL Goals Pt/caregiver will Perform Home Exercise Program: Increased strength;Right Upper extremity;Left upper extremity;Both right and left upper extremity;With written HEP provided;With theraband  Plan Discharge plan remains appropriate       AM-PAC OT "6 Clicks" Daily Activity     Outcome Measure   Help from another person eating meals?: A Little Help from another person taking care of personal grooming?: A Little Help from another person toileting, which includes using toliet, bedpan, or urinal?: A Little Help from another person bathing (including washing, rinsing, drying)?: A Little Help from another person to put on and taking off regular upper body clothing?: A Lot Help from another person to put on and taking off regular lower body clothing?: A Little 6 Click Score: 17    End of Session    OT Visit Diagnosis: Unsteadiness on feet (R26.81);Muscle weakness (generalized) (M62.81);Other abnormalities of gait and mobility (R26.89);History of falling (Z91.81)   Activity Tolerance Patient tolerated treatment well   Patient Left in bed;with bed alarm set  Nurse Communication          Time: 4098-1191 OT Time Calculation (min): 13 min  Charges: OT General Charges $OT Visit: 1 Visit OT Treatments $Therapeutic Exercise: 8-22 mins  Kari Baars, [place] [phone] Office- [phone], Edwena Felty D 11/28/2018, 3:04 PM

## 2018-11-28 NOTE — TOC Progression Note (Signed)
Transition of Care [facility]) - Progression Note    Patient Details  Name: JALAINA SALYERS MRN: [id number] Date of Birth: [birth year]  Transition of Care [facility] CM/SW Contact  Servando Snare, [place] Phone Number: 11/28/2018, 10:49 AM  Clinical Narrative:   Patient has bed at [facility] when ready.   Patients belongings have been misplaced. Patient is missing her o2 tank and clothing.     Expected Discharge Plan: [place] Barriers to Discharge: Continued Medical Work up  Expected Discharge Plan and Services Expected Discharge Plan: [place] arrangements for the past 2 months: Single Family Home Expected Discharge Date: (unknown)                         Social Determinants of Health (SDOH) Interventions    Readmission Risk Interventions Readmission Risk Prevention Plan 11/22/2018  Transportation Screening Complete  Medication Review Press photographer) Complete  PCP or Specialist appointment within 3-5 days of discharge Complete  HRI or [place] Complete  SW Recovery Care/Counseling Consult Complete  Palliative Care Screening Not [place] Complete  Some recent data might be hidden

## 2018-11-29 ENCOUNTER — Inpatient Hospital Stay (HOSPITAL_COMMUNITY): Payer: Medicare Other

## 2018-11-29 ENCOUNTER — Encounter: Payer: Self-pay | Admitting: General Practice

## 2018-11-29 LAB — BASIC METABOLIC PANEL
Anion gap: 6 (ref 5–15)
BUN: 13 mg/dL (ref 8–23)
CO2: 37 mmol/L — ABNORMAL HIGH (ref 22–32)
Calcium: 8 mg/dL — ABNORMAL LOW (ref 8.9–10.3)
Chloride: 92 mmol/L — ABNORMAL LOW (ref 98–111)
Creatinine, Ser: 0.54 mg/dL (ref 0.44–1.00)
GFR calc Af Amer: >60 mL/min (ref >60)
GFR calc non Af Amer: >60 mL/min (ref >60)
Glucose, Bld: 78 mg/dL (ref 70–99)
Potassium: 3.1 mmol/L — ABNORMAL LOW (ref 3.5–5.1)
Sodium: 135 mmol/L (ref 135–145)

## 2018-11-29 LAB — URINALYSIS, ROUTINE W REFLEX MICROSCOPIC
Bilirubin Urine: NEGATIVE
Glucose, UA: NEGATIVE mg/dL
Hgb urine dipstick: NEGATIVE
Ketones, ur: NEGATIVE mg/dL
Nitrite: NEGATIVE
Protein, ur: 30 mg/dL — AB
Specific Gravity, Urine: 1.023 (ref 1.005–1.030)
pH: 5 (ref 5.0–8.0)

## 2018-11-29 LAB — CBC WITH DIFFERENTIAL/PLATELET
Abs Immature Granulocytes: 0.14 10*3/uL — ABNORMAL HIGH (ref 0.00–0.07)
Basophils Absolute: 0 10*3/uL (ref 0.0–0.1)
Basophils Relative: 0 %
Eosinophils Absolute: 0 10*3/uL (ref 0.0–0.5)
Eosinophils Relative: 0 %
HCT: 31.2 % — ABNORMAL LOW (ref 36.0–46.0)
Hemoglobin: 10 g/dL — ABNORMAL LOW (ref 12.0–15.0)
Immature Granulocytes: 1 %
Lymphocytes Relative: 7 %
Lymphs Abs: 1.4 10*3/uL (ref 0.7–4.0)
MCH: 31.7 pg (ref 26.0–34.0)
MCHC: 32.1 g/dL (ref 30.0–36.0)
MCV: 99 fL (ref 80.0–100.0)
Monocytes Absolute: 2.5 10*3/uL — ABNORMAL HIGH (ref 0.1–1.0)
Monocytes Relative: 12 %
Neutro Abs: 16.5 10*3/uL — ABNORMAL HIGH (ref 1.7–7.7)
Neutrophils Relative %: 80 %
Platelets: 274 10*3/uL (ref 150–400)
RBC: 3.15 MIL/uL — ABNORMAL LOW (ref 3.87–5.11)
RDW: 19.8 % — ABNORMAL HIGH (ref 11.5–15.5)
WBC: 20.5 10*3/uL — ABNORMAL HIGH (ref 4.0–10.5)
nRBC: 0 % (ref 0.0–0.2)

## 2018-11-29 MED ORDER — FUROSEMIDE 10 MG/ML IJ SOLN
20.0000 mg | Freq: Two times a day (BID) | INTRAMUSCULAR | Status: DC
  Administered 2018-11-29: 20 mg via INTRAVENOUS
  Filled 2018-11-29: qty 2

## 2018-11-29 MED ORDER — POTASSIUM CHLORIDE CRYS ER 20 MEQ PO TBCR
40.0000 meq | EXTENDED_RELEASE_TABLET | ORAL | Status: AC
  Administered 2018-11-29 (×2): 40 meq via ORAL
  Filled 2018-11-29 (×2): qty 2

## 2018-11-29 MED ORDER — FUROSEMIDE 10 MG/ML IJ SOLN: 40.0000 mg | Freq: Two times a day (BID) | INTRAMUSCULAR | Status: DC

## 2018-11-29 MED ORDER — PREDNISONE 20 MG PO TABS
20.0000 mg | ORAL_TABLET | Freq: Every day | ORAL | Status: DC
  Administered 2018-11-30 – 2018-12-01 (×2): 20 mg via ORAL
  Filled 2018-11-29 (×2): qty 1

## 2018-11-29 MED ORDER — FUROSEMIDE 20 MG PO TABS
20.0000 mg | ORAL_TABLET | Freq: Two times a day (BID) | ORAL | Status: DC
  Administered 2018-11-29 – 2018-12-01 (×4): 20 mg via ORAL
  Filled 2018-11-29 (×4): qty 1

## 2018-11-29 NOTE — Progress Notes (Addendum)
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 Patient with a prior tobacco history and quit in January 2020 after her diagnosis of lung cancer. Patient also presented with increased lower extremity edema with associated nausea. CT angiogram chest was negative for PE however noticed advanced left lung malignancy, pleural effusion and new extensive bilateral peripheral nodular and heterogeneous airspace opacity and new enlarged paratracheal lymph nodes consistent with infection versus edema. ID, Dr. Johnnye Sima was curbside by nurse practitioner Forrest Moron the evening of 11/19/2018 and it was felt testing for COVID19 was not warranted at this time.  Overnight pt was transferred back to step down as she went into afib with RVR.  Palliative care consulted for goals of care.  She is currently on the floor requiring about 5 lit of [place] oxygen.  CXR  showed mild pulm edema.    Assessment & Plan:   Active Problems:   Seropositive rheumatoid arthritis ([place])   Hyperlipidemia   Hypertension   Cutaneous lupus erythematosus   COPD (chronic obstructive pulmonary disease) (HCC)   Chronic respiratory failure with hypoxia (HCC)   Hyponatremia   Malignant pleural effusion   Adenocarcinoma of left lung, stage 4 (HCC)   Port-A-Cath in place   Acute respiratory failure with hypoxia (HCC)   Pressure injury of skin   Tachycardia   Acute on chronic diastolic CHF (congestive heart failure) (HCC)   Atrial fibrillation with RVR (HCC)  #1 acute on chronic respiratory failure with hypoxia secondary to acute COPD exacerbation and  acute on chronic diastolic heart failure +/-stage IV non-small cell lung cancer Patient with complaints of shortness of breath on minimal exertion.  Patient noted to be on 13 L high flow nasal cannula with sats of 100% on 11/28/2018.  Respiratory viral panel was negative.  Urine strep pneumococcus antigen negative.  Urine Legionella antigen negative.  COVID-19 negative.  Chest x-ray done on 11/27/2018 consistent with mild pulmonary edema.  Patient was switched to IV Lasix yesterday with a urine output of 1.1 L over the past 24 hours.  We will give a dose of Lasix 20 mg IV x1 and transition to oral Lasix 20 mg p.o. twice daily this evening.  Strict I's and O's.  Daily weights.  Status post full course of antibiotic treatment.  Continue prednisone taper, MDI, Claritin, PPI.  Supportive care.   2.  New onset A. fib with RVR/paroxysmal atrial fibrillation CHA2DS2VASC SCORE 4 Patient noted to have new onset A. fib with RVR during the hospitalization likely triggered by problem #1.  TSH within normal limits.  Cardiology consulted and patient was on IV Cardizem and IV digoxin.  Rate improved and patient in sinus rhythm.  Currently rate controlled on metoprolol.  Continue flecainide.  Eliquis for anticoagulation.  Monitor closely for bleeding.  Outpatient follow-up with cardiology.  3.  Demand ischemia Patient noted to have a flat troponins in the setting of paroxysmal atrial fibrillation.  Patient seen by cardiology and it was felt this was not secondary to acute coronary symptoms.  Continue current medical management.  Follow.  4.  Stage IV non-small cell lung cancer Outpatient follow-up with  oncology.  5.  History of SLE and rheumatoid arthritis Patient was on methotrexate which is on hold and likely resume on discharge.  6.  History of tobacco use Patient quit smoking in January 2020.  Follow.  7.  Hypertension Blood pressure currently stable.  Continue Lopressor, Lasix.   8.  Hyperlipidemia Continue  statin.  9.  Hypokalemia Secondary to diuresis.  Replete.  10.  Leukocytosis Likely steroid-induced.  Repeat chest x-ray.  Blood cultures which were drawn earlier during the hospitalization negative.  Repeat blood cultures.  Check a UA with cultures and sensitivities.  Status post full course of antibiotic treatment.  Follow.   DVT prophylaxis: Eliquis Code Status: DNR Family Communication: updated patient. No family at bedside. Disposition Plan: SNF when bed available and when oxygen requirements have improved.   Consultants:   Cardiology: Dr. Johnsie Cancel 11/20/2018  Palliative care: Dr. Domingo Cocking 11/25/2018  Procedures:  CT angiogram chest 11/19/2018 Chest x-ray 11/19/2018, 11/21/2018, 11/25/2018, 11/27/2018    Antimicrobials:  IV Rocephin 11/19/2018>>>>11/25/2018  Oral azithromycin 11/19/2018>>>>>11/26/2018   Subjective: Patient sitting up in bed.  Feels some improvement with shortness of breath with diuresis yesterday however still with some shortness of breath on minimal exertion.  Denies any chest pain.  No cough.   Objective: Vitals:   11/28/18 1953 11/28/18 2055 11/29/18 0539 11/29/18 0947  BP:  (!) 151/53 (!) 131/50   Pulse:  (!) 52 (!) 51 72  Resp:  16 20   Temp:  97.7 F (36.5 C) 98.5 F (36.9 C)   TempSrc:   Oral   SpO2: 95% 92% 93%   Weight:   53.3 kg   Height:        Intake/Output Summary (Last 24 hours) at 11/29/2018 1008 Last data filed at 11/29/2018 0936 Gross per 24 hour  Intake 10 ml  Output 1100 ml  Net -1090 ml   Filed Weights   11/26/18 0543 11/28/18 0930 11/29/18 0539  Weight: 61.2 kg 56.2 kg 53.3 kg    Examination:  General exam: NAD Respiratory system: Lungs clear to auscultation bilaterally.  No wheezes, no crackles, no rhonchi.  Fair air movement.  Normal respiratory effort.  Cardiovascular system: Regular rate rhythm no murmurs rubs or gallops.  No JVD.  No lower extremity edema.  Gastrointestinal system: Abdomen is soft, nontender,  nondistended, positive bowel sounds.  No rebound.  No guarding.  Central nervous system: Alert and oriented. No focal neurological deficits. Extremities: Symmetric 5 x 5 power. Skin: No rashes, lesions or ulcers Psychiatry: Judgement and insight appear normal. Mood & affect appropriate.     Data Reviewed: I have personally reviewed following labs and imaging studies  CBC: Recent Labs  Lab 11/28/18 0952 11/29/18 0408  WBC 18.7* 20.5*  NEUTROABS 15.8* 16.5*  HGB 10.3* 10.0*  HCT 33.0* 31.2*  MCV 99.7 99.0  PLT 331 814   Basic Metabolic Panel: Recent Labs  Lab 11/23/18 0936 11/24/18 0514 11/28/18 0952 11/29/18 0408  NA 133* 137 136 135  K 3.4* 3.8 3.6 3.1*  CL 94* 97* 91* 92*  CO2 30 33* 34* 37*  GLUCOSE 248* 91 92 78  BUN 34* 29* 13 13  CREATININE 0.70 0.65 0.52 0.54  CALCIUM 7.5* 7.8* 8.0* 8.0*   GFR: Estimated Creatinine Clearance: 45.8 mL/min (by C-G formula based on SCr of 0.54 mg/dL). Liver Function Tests: No results for input(s): AST, ALT, ALKPHOS, BILITOT, PROT, ALBUMIN in the last 168 hours. No results for input(s): LIPASE, AMYLASE in the last 168  hours. No results for input(s): AMMONIA in the last 168 hours. Coagulation Profile: No results for input(s): INR, PROTIME in the last 168 hours. Cardiac Enzymes: Recent Labs  Lab 11/25/18 2058 11/26/18 0259 11/26/18 0900  TROPONINI 0.03* 0.03* <0.03   BNP (last 3 results) No results for input(s): PROBNP in the last 8760 hours. HbA1C: No results for input(s): HGBA1C in the last 72 hours. CBG: No results for input(s): GLUCAP in the last 168 hours. Lipid Profile: No results for input(s): CHOL, HDL, LDLCALC, TRIG, CHOLHDL, LDLDIRECT in the last 72 hours. Thyroid Function Tests: No results for input(s): TSH, T4TOTAL, FREET4, T3FREE, THYROIDAB in the last 72 hours. Anemia Panel: No results for input(s): VITAMINB12, FOLATE, FERRITIN, TIBC, IRON, RETICCTPCT in the last 72 hours. Sepsis Labs: No results for  input(s): PROCALCITON, LATICACIDVEN in the last 168 hours.  Recent Results (from the past 240 hour(s))  Blood culture (routine x 2)     Status: None   Collection Time: 11/19/18 12:46 PM  Result Value Ref Range Status   Specimen Description   Final    BLOOD PORTA CATH RIGHT SIDE Performed at [facility] [address], [place], [place] [address]    Special Requests   Final    BOTTLES DRAWN AEROBIC AND ANAEROBIC Blood Culture adequate volume Performed at [facility] [address], [place], [place] [address]    Culture   Final    NO GROWTH 5 DAYS Performed at [facility], [place] [address] [place], [place] [address]    Report Status 11/24/2018 FINAL  Final  Blood culture (routine x 2)     Status: None   Collection Time: 11/19/18 12:52 PM  Result Value Ref Range Status   Specimen Description   Final    BLOOD RIGHT ANTECUBITAL Performed at [facility] [address] [place], [place] [address]    Special Requests   Final    BOTTLES DRAWN AEROBIC AND ANAEROBIC Blood Culture adequate volume Performed at [facility] [address], [place], [place] [address]    Culture   Final    NO GROWTH 5 DAYS Performed at [facility], [place] [address], [place], [place] [address]    Report Status 11/24/2018 FINAL  Final  Respiratory Panel by PCR     Status: None   Collection Time: 11/19/18  9:13 PM  Result Value Ref Range Status   Adenovirus NOT DETECTED NOT DETECTED Final   Coronavirus 229E NOT DETECTED NOT DETECTED Final    Comment: (NOTE) The Coronavirus on the Respiratory Panel, DOES NOT test for the novel  Coronavirus (2019 nCoV)    Coronavirus HKU1 NOT DETECTED NOT DETECTED Final   Coronavirus NL63 NOT DETECTED NOT DETECTED Final   Coronavirus OC43 NOT DETECTED NOT DETECTED Final   Metapneumovirus NOT DETECTED NOT DETECTED Final   Rhinovirus / Enterovirus NOT DETECTED NOT DETECTED Final    Influenza A NOT DETECTED NOT DETECTED Final   Influenza B NOT DETECTED NOT DETECTED Final   Parainfluenza Virus 1 NOT DETECTED NOT DETECTED Final   Parainfluenza Virus 2 NOT DETECTED NOT DETECTED Final   Parainfluenza Virus 3 NOT DETECTED NOT DETECTED Final   Parainfluenza Virus 4 NOT DETECTED NOT DETECTED Final   Respiratory Syncytial Virus NOT DETECTED NOT DETECTED Final   Bordetella pertussis NOT DETECTED NOT DETECTED Final   Chlamydophila pneumoniae NOT DETECTED NOT DETECTED Final   Mycoplasma pneumoniae NOT DETECTED NOT DETECTED Final  Comment: Performed at [facility], [place] [address], [place], [place] [address]  MRSA PCR Screening     Status: None   Collection Time: 11/19/18  9:13 PM  Result Value Ref Range Status   MRSA by PCR NEGATIVE NEGATIVE Final    Comment:        The GeneXpert MRSA Assay (FDA approved for NASAL specimens only), is one component of a comprehensive MRSA colonization surveillance program. It is not intended to diagnose MRSA infection nor to guide or monitor treatment for MRSA infections. Performed at [facility], [place] [address] [place], [place] [address]   Novel Coronavirus, NAA (hospital order; send-out to ref lab)     Status: None   Collection Time: 11/19/18  9:13 PM  Result Value Ref Range Status   SARS-CoV-2, NAA NOT DETECTED NOT DETECTED Final    Comment: (NOTE) Testing was performed using the cobas(R) SARS-CoV-2 test. This test was developed and its performance characteristics determined by [employer]. This test has not been FDA cleared or approved. This test has been authorized by FDA under an Emergency Use Authorization (EUA). This test is only authorized for the duration of time the declaration that circumstances exist justifying the authorization of the emergency use of in vitro diagnostic tests for detection of SARS-CoV-2 virus and/or diagnosis of COVID-19 infection under section 564(b)(1) of the  Act, 21 U.S.C. 275TZG-0(F)(7), unless the authorization is terminated or revoked sooner. Performed At: [facility] [place], [place] [id number] Rush Farmer MD FM:[id number]    [place]  Final    Comment: Performed at [facility] [address] [place], [place] [address]  Culture, Urine     Status: None   Collection Time: 11/20/18  8:00 AM  Result Value Ref Range Status   Specimen Description   Final    URINE, RANDOM Performed at [facility] [address] [place], [place] [address]    Special Requests   Final    NONE Performed at [facility], [place] [address], [place], [place] [address]    Culture   Final    NO GROWTH Performed at [facility], [place] [address], [place], [place] [address]    Report Status 11/21/2018 FINAL  Final  Culture, sputum-assessment     Status: None   Collection Time: 11/20/18 11:37 AM  Result Value Ref Range Status   Specimen Description SPUTUM  Final   Special Requests Immunocompromised  Final   Sputum evaluation   Final    THIS SPECIMEN IS ACCEPTABLE FOR SPUTUM CULTURE Performed at [facility], [place] [address], [place], [place] [address]    Report Status 11/20/2018 FINAL  Final  Culture, respiratory     Status: None   Collection Time: 11/20/18 11:37 AM  Result Value Ref Range Status   Specimen Description   Final    SPUTUM Performed at [facility] [address] [place], [place] [address]    Special Requests   Final    Immunocompromised Reflexed from [id number] Performed at [facility], [place] [address] [place], [place] [address]    Gram Stain   Final    ABUNDANT WBC PRESENT, PREDOMINANTLY PMN FEW GRAM POSITIVE COCCI FEW GRAM POSITIVE RODS    Culture   Final    FEW Consistent with normal respiratory flora. Performed at [facility], [place] [address],  [place], [place] [address]  Report Status 11/22/2018 FINAL  Final         Radiology Studies: Dg Chest 1 View  Result Date: 11/27/2018 CLINICAL DATA:  Hypoxia, history of hypertension EXAM: CHEST  1 VIEW COMPARISON:  11/25/2018 FINDINGS: There is bilateral diffuse mild interstitial thickening. There bilateral small pleural effusions, left greater than right. There is no pneumothorax. The heart mediastinum are stable. There is thoracic aortic atherosclerosis. There is a right-sided Port-A-Cath in satisfactory position. The osseous structures are unremarkable. IMPRESSION: 1. Findings concerning for mild pulmonary edema. Electronically Signed   By: Kathreen Devoid   On: 11/27/2018 11:04   Dg Chest Port 1 View  Result Date: 11/29/2018 CLINICAL DATA:  Leukocytosis and history of lung cancer EXAM: PORTABLE CHEST 1 VIEW COMPARISON:  11/27/2018 FINDINGS: Cardiac shadow is within normal limits. Aortic calcifications are again seen. Lungs are well aerated bilaterally. Small pleural effusions are again seen and stable. Increasing left retrocardiac density is noted likely representing atelectasis. No new focal infiltrate is seen. No bony abnormality is noted. IMPRESSION: Increase in left retrocardiac density. Stable effusions bilaterally. Electronically Signed   By: Inez Catalina M.D.   On: 11/29/2018 09:50        Scheduled Meds: . apixaban  2.5 mg Oral BID  . atorvastatin  40 mg Oral Q2000  . chlorhexidine  15 mL Mouth Rinse BID  . feeding supplement (ENSURE ENLIVE)  237 mL Oral BID BM  . flecainide  50 mg Oral Q12H  . fluticasone  2 spray Each Nare Daily  . folic acid  1 mg Oral Daily  . furosemide  20 mg Intravenous Q12H  . gabapentin  100 mg Oral TID  . ipratropium  2 puff Inhalation TID  . levalbuterol  2 puff Inhalation TID  . loratadine  10 mg Oral Daily  . mouth rinse  15 mL Mouth Rinse q12n4p  . metoprolol tartrate  100 mg Oral BID  . multivitamin with minerals  1 tablet Oral Daily  .  pantoprazole  40 mg Oral Q0600  . potassium chloride  40 mEq Oral Q4H  . [START ON 11/30/2018] predniSONE  20 mg Oral QAC breakfast  . sodium chloride flush  10-40 mL Intracatheter Q12H  . umeclidinium-vilanterol  1 puff Inhalation Daily   Continuous Infusions: . sodium chloride Stopped (11/27/18 1949)     LOS: 10 days    Time spent: 40 minutes.    Irine Seal, MD [facility]  If 7PM-7AM, please contact night-coverage [email or web address] 11/29/2018, 10:08 AM

## 2018-11-29 NOTE — Progress Notes (Signed)
Daily Progress Note   Patient Name: Donna Mcneil       Date: 11/29/2018 DOB: Nov 03, 1940  Age: 78 y.o. MRN#: [id number] Attending Physician: Eugenie Filler, MD Primary Care Physician: [name], [place] Admit Date: 11/19/2018  Reason for Consultation/Follow-up: Establishing goals of care  Subjective: Patient is resting in bed, she opens her eyes when her name is called, she also answers few questions.   Ongoing efforts with PT  Ongoing efforts to optimize her resp status, still on O2, at present on 8L O2 [place]. Discussed with bedside RN who reports that the patient has O2 desaturations upon slight exertion.   Length of Stay: 10  Current Medications: Scheduled Meds:  . apixaban  2.5 mg Oral BID  . atorvastatin  40 mg Oral Q2000  . chlorhexidine  15 mL Mouth Rinse BID  . feeding supplement (ENSURE ENLIVE)  237 mL Oral BID BM  . flecainide  50 mg Oral Q12H  . fluticasone  2 spray Each Nare Daily  . folic acid  1 mg Oral Daily  . furosemide  20 mg Oral BID  . gabapentin  100 mg Oral TID  . ipratropium  2 puff Inhalation TID  . levalbuterol  2 puff Inhalation TID  . loratadine  10 mg Oral Daily  . mouth rinse  15 mL Mouth Rinse q12n4p  . metoprolol tartrate  100 mg Oral BID  . multivitamin with minerals  1 tablet Oral Daily  . pantoprazole  40 mg Oral Q0600  . potassium chloride  40 mEq Oral Q4H  . [START ON 11/30/2018] predniSONE  20 mg Oral QAC breakfast  . sodium chloride flush  10-40 mL Intracatheter Q12H  . umeclidinium-vilanterol  1 puff Inhalation Daily    Continuous Infusions: . sodium chloride Stopped (11/27/18 1949)    PRN Meds: sodium chloride, acetaminophen **OR** [DISCONTINUED] acetaminophen, acetaminophen, ALPRAZolam, guaiFENesin-dextromethorphan,  hydrALAZINE, HYDROcodone-acetaminophen, levalbuterol, lip balm, metoprolol tartrate, morphine injection, ondansetron **OR** ondansetron (ZOFRAN) IV, polyethylene glycol, polyvinyl alcohol, prochlorperazine, sodium chloride flush  Physical Exam         Pale appearing lady Resting in bed Not using accessory muscles Diminished breath sounds S 1 S 2  Abdomen is not distended She has trace edema  Vital Signs: BP (!) 131/50 (BP Location: Left Arm)   Pulse 72  Temp 98.5 F (36.9 C) (Oral)   Resp 20   Ht 5\' 2"  (1.575 m)   Wt 53.3 kg   SpO2 92%   BMI 21.49 kg/m  SpO2: SpO2: 92 % O2 Device: O2 Device: Nasal Cannula O2 Flow Rate: O2 Flow Rate (L/min): 8 L/min  Intake/output summary:   Intake/Output Summary (Last 24 hours) at 11/29/2018 1316 Last data filed at 11/29/2018 1000 Gross per 24 hour  Intake 10 ml  Output 800 ml  Net -790 ml   LBM: Last BM Date: 11/27/18 Baseline Weight: Weight: 58.1 kg Most recent weight: Weight: 53.3 kg       Palliative Assessment/Data:    Flowsheet Rows     Most Recent Value  Intake Tab  Referral Department  Hospitalist  Unit at Time of Referral  Med/Surg Unit  Palliative Care Primary Diagnosis  Cancer  Date Notified  11/23/18  Palliative Care Type  New Palliative care  Reason for referral  Clarify Goals of Care  Date of Admission  11/19/18  Date first seen by Palliative Care  11/25/18  # of days Palliative referral response time  2 Day(s)  # of days IP prior to Palliative referral  4  Clinical Assessment  Palliative Performance Scale Score  40%  Pain Max last 24 hours  6  Pain Min Last 24 hours  0  Dyspnea Max Last 24 Hours  8  Dyspnea Min Last 24 hours  2  Psychosocial & Spiritual Assessment  Palliative Care Outcomes  Patient/Family meeting held?  Yes  Who was at the meeting?  patient      Patient Active Problem List   Diagnosis Date Noted  . Pressure injury of skin 11/20/2018  . Tachycardia   . Acute on chronic diastolic CHF  (congestive heart failure) ([place])   . Atrial fibrillation with RVR ([place])   . Acute respiratory failure with hypoxia ([place]) 11/19/2018  . Port-A-Cath in place 10/24/2018  . Adenocarcinoma of left lung, stage 4 ([place]) 09/26/2018  . Goals of care, counseling/discussion 09/26/2018  . Encounter for antineoplastic chemotherapy 09/26/2018  . Community acquired pneumonia of left lower lobe of lung ([place])   . Mass of upper lobe of left lung 09/02/2018  . COPD (chronic obstructive pulmonary disease) ([place]) 09/01/2018  . Chronic respiratory failure with hypoxia ([place]) 09/01/2018  . Hyponatremia 09/01/2018  . 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  . 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 Rheumatoid arthritis involving multiple sites with positive rheumatoid factor ([place]) 12/12/2017  . Tobacco use 04/14/2017  . Carotid stenosis 04/14/2017  . Peripheral neuropathy 06/24/2016  . 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 Chronic bronchitis ([place]) 11/13/2015  . Osteoarthritis 10/05/2015  . Hyperlipidemia 10/05/2015  . Hypertension 10/05/2015  . Osteoporosis 10/05/2015  . Acid reflux 10/05/2015  . Peptic ulcer 10/05/2015  . Cataract 10/05/2015  . Tendinitis 10/05/2015  . Osteopenia 02/11/2014  . Seropositive rheumatoid arthritis ([place]) 02/11/2014    Palliative Care Assessment & Plan   Patient Profile:    Assessment:  Acute on chronic respiratory failure with hypoxia secondary to acute COPD exacerbation and acute on chronic diastolic heart failure +/-stage IV non-small cell lung cancer.  Stage IV non small cell lung cancer She has history of SLE and RA High O2 requirements.  Demand ischemia is also suspected.  Has new onset A fib.  PPS 40%  Recommendations/Plan:  Call placed and discussed with contact Mr Pinnix who stated that he is the patient's ex and primary caregiver along with his wife.  Patient has one son. Patient lives by herself in [place], [place] and Mr. Dunlap lives in [place], [place].   We discussed about the patient's current condition. Mr Pinnix would like to be informed when she is deemed stable enough to go to SNF.   Additionally, he is concerned that he is being billed for the patient's O2 tank which he says has been lost in the hospital. CSW/CM are following.   Mr Pinnix is concerned that the patient is not on anti cancer treatments currently. We discussed about her current resp status and that efforts are being currently directed at improving her resp status, recommend out patient med onc follow up.   PMT to follow hospital course and overall disease trajectory and to engage in further [place] discussions going forward.    Code Status:    Code Status Orders  (From admission, onward)         Start     Ordered   11/20/18 1014  Do not attempt resuscitation (DNR)  Continuous    Question Answer Comment  In the event of cardiac or respiratory ARREST Do not call a "code blue"   In the event of cardiac or respiratory ARREST Do not perform Intubation, CPR, defibrillation or ACLS   In the event of cardiac or respiratory ARREST Use medication by any route, position, wound care, and other measures to relive pain and suffering. May use oxygen, suction and manual treatment of airway obstruction as needed for comfort.      11/20/18 1018        Code Status History    Date Active Date Inactive Code Status Order ID Comments User Context   11/19/2018 1854 11/20/2018 1018 DNR [id number]  [place] Eric J, [place] ED   10/17/2018 1215 11/19/2018 1132 DNR [id number]  [place], [place] [facility] Outpatient   09/01/2018 2345 09/06/2018 1824 DNR [id number]  Toy Baker, MD Inpatient    Advance Directive Documentation     Most Recent Value  Type of Advance Directive  Healthcare Power of Attorney, Living will  Pre-existing out of facility DNR order (yellow form or pink MOST form)  -  "MOST" Form in Place?  -       Prognosis:   guarded  Discharge Planning:  To Be Determined   Care plan was discussed with  Patient, call placed and also discussed with next of kin Mr Beynon who stated that he is the patient's caregiver and HCPOA agent.   Thank you for allowing the Palliative Medicine Team to assist in the care of this patient.   Time In: 12 Time Out: 12.35 Total Time 35 Prolonged Time Billed  no       Greater than 50%  of this time was spent counseling and coordinating care related to the above assessment and plan.  Loistine Chance, MD [phone] Please contact Palliative Medicine Team phone at [phone] for questions and concerns.

## 2018-11-29 NOTE — Progress Notes (Signed)
[facility] Team contacted patient to assess for food insecurity and other psychosocial needs during current COVID19 pandemic.  Unable to reach by phone, no VM.    Beverely Pace, [place]

## 2018-11-29 NOTE — Care Management Important Message (Signed)
Important Message  Patient Details  Name: Donna Mcneil MRN: [id number] Date of Birth: 09-Aug-1941   Medicare Important Message Given:  Yes    Kerin Salen 11/29/2018, 5:16 [place] Message  Patient Details  Name: Donna Mcneil MRN: [id number] Date of Birth: 1941-03-14   Medicare Important Message Given:  Yes    Kerin Salen 11/29/2018, 5:16 PM

## 2018-11-29 NOTE — Progress Notes (Signed)
Physical Therapy Treatment Patient Details Name: Donna Mcneil MRN: [id number] DOB: 29-Mar-1941 Today's Date: 11/29/2018    History of Present Illness 78 yo female admitted to ED on 3/23 with shortness of breath, on home O2. Pt with stage IV lung cancer. PMH includes COPD exacerbation, diverticulitis, HTN, lupus, OP, ARF with hypoxia, malignant pleural effusion, RA, peripheral neuropathy, anxiety, broncitis, loop sigmoid colectomy.     PT Comments    Progressing slowly with mobility. Pt remained on 8L [place] O2 on today-sat level remained steady in the 90s. Dyspnea 2/4. Will continue to follow and progress activity as tolerated. Recommend ST rehab at SNF.    Follow Up Recommendations  SNF     Equipment Recommendations  (TBD at next venue)    Recommendations for Other Services       Precautions / Restrictions Precautions Precautions: Fall Precaution Comments: monitor oxygen.  Restrictions Weight Bearing Restrictions: No    Mobility  Bed Mobility Overal bed mobility: Needs Assistance Bed Mobility: Supine to Sit;Sit to Supine     Supine to sit: Min guard;HOB elevated Sit to supine: Min guard;HOB elevated   General bed mobility comments: close guard for safety. Pt relied on bedrail.   Transfers Overall transfer level: Needs assistance Equipment used: 1 person hand held assist Transfers: Sit to/from [facility] Sit to Stand: Mod assist Stand pivot transfers: Mod assist       General transfer comment: Assist to rise, stabilize, control descent. VCs safety, hand placement. Stand pivot x 2, bed<>bsc. Pt is very unsteady.   Ambulation/Gait             General Gait Details: NT   Stairs             Wheelchair Mobility    Modified Rankin (Stroke Patients Only)       Balance Overall balance assessment: Needs assistance         Standing balance support: Bilateral upper extremity supported Standing balance-Leahy Scale: Poor                               Cognition Arousal/Alertness: Awake/alert Behavior During Therapy: WFL for tasks assessed/performed Overall Cognitive Status: Within Functional Limits for tasks assessed                                        Exercises      General Comments        Pertinent Vitals/Pain Pain Assessment: No/denies pain    Home Living                      Prior Function            PT Goals (current goals can now be found in the care plan section) Progress towards PT goals: Progressing toward goals    Frequency    Min 2X/week      PT Plan Current plan remains appropriate    Co-evaluation              AM-PAC PT "6 Clicks" Mobility   Outcome Measure  Help needed turning from your back to your side while in a flat bed without using bedrails?: A Little Help needed moving from lying on your back to sitting on the side of a flat bed without using bedrails?: A Little Help needed moving to  and from a bed to a chair (including a wheelchair)?: A Lot Help needed standing up from a chair using your arms (e.g., wheelchair or bedside chair)?: A Lot Help needed to walk in hospital room?: A Lot Help needed climbing 3-5 steps with a railing? : Total 6 Click Score: 13    End of Session Equipment Utilized During Treatment: Oxygen Activity Tolerance: Patient limited by fatigue Patient left: in bed;with call bell/phone within reach   PT Visit Diagnosis: Muscle weakness (generalized) (M62.81);Unsteadiness on feet (R26.81);Difficulty in walking, not elsewhere classified (R26.2)     Time: 4734-0370 PT Time Calculation (min) (ACUTE ONLY): 23 min  Charges:  $Therapeutic Activity: 23-37 mins                       Weston Anna, PT Acute Rehabilitation Services Pager: [phone] Office: [phone]

## 2018-11-30 LAB — BASIC METABOLIC PANEL
Anion gap: 7 (ref 5–15)
BUN: 18 mg/dL (ref 8–23)
CO2: 34 mmol/L — ABNORMAL HIGH (ref 22–32)
Calcium: 7.8 mg/dL — ABNORMAL LOW (ref 8.9–10.3)
Chloride: 93 mmol/L — ABNORMAL LOW (ref 98–111)
Creatinine, Ser: 0.67 mg/dL (ref 0.44–1.00)
GFR calc Af Amer: >60 mL/min (ref >60)
GFR calc non Af Amer: >60 mL/min (ref >60)
Glucose, Bld: 80 mg/dL (ref 70–99)
Potassium: 4.1 mmol/L (ref 3.5–5.1)
Sodium: 134 mmol/L — ABNORMAL LOW (ref 135–145)

## 2018-11-30 LAB — CBC WITH DIFFERENTIAL/PLATELET
Abs Immature Granulocytes: 0.13 10*3/uL — ABNORMAL HIGH (ref 0.00–0.07)
Basophils Absolute: 0 10*3/uL (ref 0.0–0.1)
Basophils Relative: 0 %
Eosinophils Absolute: 0 10*3/uL (ref 0.0–0.5)
Eosinophils Relative: 0 %
HCT: 30.8 % — ABNORMAL LOW (ref 36.0–46.0)
Hemoglobin: 9.6 g/dL — ABNORMAL LOW (ref 12.0–15.0)
Immature Granulocytes: 1 %
Lymphocytes Relative: 9 %
Lymphs Abs: 1.6 10*3/uL (ref 0.7–4.0)
MCH: 31.1 pg (ref 26.0–34.0)
MCHC: 31.2 g/dL (ref 30.0–36.0)
MCV: 99.7 fL (ref 80.0–100.0)
Monocytes Absolute: 2 10*3/uL — ABNORMAL HIGH (ref 0.1–1.0)
Monocytes Relative: 11 %
Neutro Abs: 14.3 10*3/uL — ABNORMAL HIGH (ref 1.7–7.7)
Neutrophils Relative %: 79 %
Platelets: 246 10*3/uL (ref 150–400)
RBC: 3.09 MIL/uL — ABNORMAL LOW (ref 3.87–5.11)
RDW: 19.9 % — ABNORMAL HIGH (ref 11.5–15.5)
WBC: 18.1 10*3/uL — ABNORMAL HIGH (ref 4.0–10.5)
nRBC: 0 % (ref 0.0–0.2)

## 2018-11-30 MED ORDER — SORBITOL 70 % SOLN
30.0000 mL | Freq: Once | Status: AC
  Administered 2018-11-30: 15:00:00 30 mL via ORAL
  Filled 2018-11-30: qty 30

## 2018-11-30 NOTE — Progress Notes (Signed)
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 Patient with a prior tobacco history and quit in January 2020 after her diagnosis of lung cancer. Patient also presented with increased lower extremity edema with associated nausea. CT angiogram chest was negative for PE however noticed advanced left lung malignancy, pleural effusion and new extensive bilateral peripheral nodular and heterogeneous airspace opacity and new enlarged paratracheal lymph nodes consistent with infection versus edema. ID, Dr. Johnnye Sima was curbside by nurse practitioner Forrest Moron the evening of 11/19/2018 and it was felt testing for COVID19 was not warranted at this time.  Overnight pt was transferred back to step down as she went into afib with RVR.  Palliative care consulted for goals of care.  She is currently on the floor requiring about 5 lit of [place] oxygen.  CXR  showed mild pulm edema.    Assessment & Plan:   Active Problems:   Seropositive rheumatoid arthritis ([place])   Hyperlipidemia   Hypertension   Cutaneous lupus erythematosus   COPD (chronic obstructive pulmonary disease) (HCC)   Chronic respiratory failure with hypoxia (HCC)   Hyponatremia   Malignant pleural effusion   Adenocarcinoma of left lung, stage 4 (HCC)   Port-A-Cath in place   Acute respiratory failure with hypoxia (HCC)   Pressure injury of skin   Tachycardia   Acute on chronic diastolic CHF (congestive heart failure) (HCC)   Atrial fibrillation with RVR (HCC)  1 acute on chronic respiratory failure with hypoxia secondary to acute COPD exacerbation and  acute on chronic diastolic heart failure +/-stage IV non-small cell lung cancer Patient with complaints of shortness of breath on minimal exertion.  Patient noted to be on 13 L high flow nasal cannula with sats of 100% on 11/28/2018.  Respiratory viral panel was negative.  Urine strep pneumococcus antigen negative.  Urine Legionella antigen negative.  COVID-19 negative.  Chest x-ray done on 11/27/2018 consistent with mild pulmonary edema.  Patient was switched to IV Lasix with good urine output.  Patient subsequently transition to oral Lasix 20 mg twice daily which she is tolerating.  Improving clinically.  Currently on 3-1/2 to 4 L nasal cannula with sats of 87-88%.  Improved clinically.  Strict I's and O's.  Daily weights.  Status post full course of antibiotic treatment.  Continue prednisone taper, MDI, Claritin, PPI.  Supportive care.   2.  New onset A. fib with RVR/paroxysmal atrial fibrillation CHA2DS2VASC SCORE 4 Patient noted to have new onset A. fib with RVR during the hospitalization likely triggered by problem #1.  TSH within normal limits.  Cardiology consulted and patient was on IV Cardizem and IV digoxin.  Rate improved and patient in sinus rhythm.  Continue metoprolol for rate control.  Continue flecainide.  Eliquis for anticoagulation.  Monitor closely for bleeding.  Outpatient follow-up with cardiology.  3.  Demand ischemia Patient noted to have a flat troponins in the setting of paroxysmal atrial fibrillation.  Patient seen by cardiology and it was felt this was not secondary to acute coronary symptoms.  Continue current medical management.  Follow.  4.  Stage IV non-small cell lung cancer Outpatient follow-up  with oncology.  5.  History of SLE and rheumatoid arthritis Patient was on methotrexate which is on hold and likely resume on discharge.  6.  History of tobacco use Patient quit smoking in January 2020.  Follow.  7.  Hypertension Blood pressure currently stable.  Continue  current regimen of Lopressor and Lasix.   8.  Hyperlipidemia Continue statin.  9.  Hypokalemia Secondary to diuresis.  Replete.  10.  Leukocytosis Likely steroid-induced.  WBC starting to trend back down.  Repeat chest x-ray with no acute infiltrate noted.  Blood cultures which were drawn earlier during the hospitalization negative.  Repeat blood cultures with no growth to date.  Urine cultures pending.  Status post full course of antibiotic treatment.  Follow.   DVT prophylaxis: Eliquis Code Status: DNR Family Communication: updated patient. No family at bedside. Disposition Plan: SNF when bed available and when oxygen requirements have improved hopefully tomorrow.   Consultants:   Cardiology: Dr. Johnsie Cancel 11/20/2018  Palliative care: Dr. Domingo Cocking 11/25/2018  Procedures:  CT angiogram chest 11/19/2018 Chest x-ray 11/19/2018, 11/21/2018, 11/25/2018, 11/27/2018    Antimicrobials:  IV Rocephin 11/19/2018>>>>11/25/2018  Oral azithromycin 11/19/2018>>>>>11/26/2018   Subjective: Patient sitting up on bedside commode.  Feels shortness of breath is improving.  Denies any chest pain.  Stated last bowel movement was 3 days ago.  Cough improved.    Objective: Vitals:   11/30/18 0507 11/30/18 0609 11/30/18 0900 11/30/18 0901  BP: (!) 141/57     Pulse: (!) 52  62   Resp: (!) 21     Temp: 97.7 F (36.5 C)     TempSrc: Oral     SpO2: 95%   92%  Weight:  54.2 kg    Height:        Intake/Output Summary (Last 24 hours) at 11/30/2018 0928 Last data filed at 11/30/2018 0500 Gross per 24 hour  Intake 610 ml  Output 2 ml  Net 608 ml   Filed Weights   11/29/18 0539 11/30/18 0400 11/30/18 0609  Weight: 53.3 kg 54.3 kg 54.2 kg    Examination:  General exam: NAD Respiratory system: CTA B.  No wheezes, no crackles, no rhonchi.  Fair air movement.  Normal respiratory effort.  Cardiovascular system: RRR no murmurs rubs or gallops.  No JVD.  No lower extremity edema.  Gastrointestinal system:  Abdomen is nontender, nondistended, soft, positive bowel sounds.  No rebound.  No guarding.  Central nervous system: Alert and oriented. No focal neurological deficits. Extremities: Symmetric 5 x 5 power. Skin: No rashes, lesions or ulcers Psychiatry: Judgement and insight appear normal. Mood & affect appropriate.     Data Reviewed: I have personally reviewed following labs and imaging studies  CBC: Recent Labs  Lab 11/28/18 0952 11/29/18 0408 11/30/18 0406  WBC 18.7* 20.5* 18.1*  NEUTROABS 15.8* 16.5* 14.3*  HGB 10.3* 10.0* 9.6*  HCT 33.0* 31.2* 30.8*  MCV 99.7 99.0 99.7  PLT 331 274 784   Basic Metabolic Panel: Recent Labs  Lab 11/23/18 0936 11/24/18 0514 11/28/18 0952 11/29/18 0408 11/30/18 0406  NA 133* 137 136 135 134*  K 3.4* 3.8 3.6 3.1* 4.1  CL 94* 97* 91* 92* 93*  CO2 30 33* 34* 37* 34*  GLUCOSE 248* 91 92 78 80  BUN 34* 29* 13 13 18   CREATININE 0.70 0.65 0.52 0.54 0.67  CALCIUM 7.5* 7.8* 8.0* 8.0* 7.8*   GFR: Estimated Creatinine Clearance: 45.8 mL/min (by C-G formula based on SCr of 0.67 mg/dL). Liver Function  Tests: No results for input(s): AST, ALT, ALKPHOS, BILITOT, PROT, ALBUMIN in the last 168 hours. No results for input(s): LIPASE, AMYLASE in the last 168 hours. No results for input(s): AMMONIA in the last 168 hours. Coagulation Profile: No results for input(s): INR, PROTIME in the last 168 hours. Cardiac Enzymes: Recent Labs  Lab 11/25/18 2058 11/26/18 0259 11/26/18 0900  TROPONINI 0.03* 0.03* <0.03   BNP (last 3 results) No results for input(s): PROBNP in the last 8760 hours. HbA1C: No results for input(s): HGBA1C in the last 72 hours. CBG: No results for input(s): GLUCAP in the last 168 hours. Lipid Profile: No results for input(s): CHOL, HDL, LDLCALC, TRIG, CHOLHDL, LDLDIRECT in the last 72 hours. Thyroid Function Tests: No results for input(s): TSH, T4TOTAL, FREET4, T3FREE, THYROIDAB in the last 72 hours. Anemia Panel: No  results for input(s): VITAMINB12, FOLATE, FERRITIN, TIBC, IRON, RETICCTPCT in the last 72 hours. Sepsis Labs: No results for input(s): PROCALCITON, LATICACIDVEN in the last 168 hours.  Recent Results (from the past 240 hour(s))  Culture, sputum-assessment     Status: None   Collection Time: 11/20/18 11:37 AM  Result Value Ref Range Status   Specimen Description SPUTUM  Final   Special Requests Immunocompromised  Final   Sputum evaluation   Final    THIS SPECIMEN IS ACCEPTABLE FOR SPUTUM CULTURE Performed at [facility], [place] [address] [place], [place] [address]    Report Status 11/20/2018 FINAL  Final  Culture, respiratory     Status: None   Collection Time: 11/20/18 11:37 AM  Result Value Ref Range Status   Specimen Description   Final    SPUTUM Performed at [facility] [address], [place], [place] [address]    Special Requests   Final    Immunocompromised Reflexed from [id number] Performed at [facility], [place] [address], [place], [place] [address]    Gram Stain   Final    ABUNDANT WBC PRESENT, PREDOMINANTLY PMN FEW GRAM POSITIVE COCCI FEW GRAM POSITIVE RODS    Culture   Final    FEW Consistent with normal respiratory flora. Performed at [facility], [place] [address], [place],  [address]    Report Status 11/22/2018 FINAL  Final         Radiology Studies: Dg Chest Port 1 View  Result Date: 11/29/2018 CLINICAL DATA:  Leukocytosis and history of lung cancer EXAM: PORTABLE CHEST 1 VIEW COMPARISON:  11/27/2018 FINDINGS: Cardiac shadow is within normal limits. Aortic calcifications are again seen. Lungs are well aerated bilaterally. Small pleural effusions are again seen and stable. Increasing left retrocardiac density is noted likely representing atelectasis. No new focal infiltrate is seen. No bony abnormality is noted. IMPRESSION: Increase in left retrocardiac density. Stable effusions bilaterally.  Electronically Signed   By: Inez Catalina M.D.   On: 11/29/2018 09:50        Scheduled Meds: . apixaban  2.5 mg Oral BID  . atorvastatin  40 mg Oral Q2000  . chlorhexidine  15 mL Mouth Rinse BID  . feeding supplement (ENSURE ENLIVE)  237 mL Oral BID BM  . flecainide  50 mg Oral Q12H  . fluticasone  2 spray Each Nare Daily  . folic acid  1 mg Oral Daily  . furosemide  20 mg Oral BID  . gabapentin  100 mg Oral TID  . ipratropium  2 puff Inhalation TID  . levalbuterol  2 puff Inhalation TID  . loratadine  10 mg Oral Daily  . mouth rinse  15 mL Mouth Rinse q12n4p  . metoprolol tartrate  100 mg Oral BID  . multivitamin with minerals  1 tablet Oral Daily  . pantoprazole  40 mg Oral Q0600  . predniSONE  20 mg Oral QAC breakfast  . sodium chloride flush  10-40 mL Intracatheter Q12H  . umeclidinium-vilanterol  1 puff Inhalation Daily   Continuous Infusions: . sodium chloride Stopped (11/27/18 1949)     LOS: 11 days    Time spent: 40 minutes.    Irine Seal, MD [facility]  If 7PM-7AM, please contact night-coverage [email or web address] 11/30/2018, 9:28 AM

## 2018-11-30 NOTE — Plan of Care (Signed)
  Problem: Clinical Measurements: Goal: Will remain free from infection Outcome: Progressing Goal: Diagnostic test results will improve Outcome: Progressing Goal: Respiratory complications will improve Outcome: Progressing   Problem: Activity: Goal: Risk for activity intolerance will decrease Outcome: Progressing   Problem: Nutrition: Goal: Adequate nutrition will be maintained Outcome: Progressing   Problem: Coping: Goal: Level of anxiety will decrease Outcome: Progressing   Problem: Safety: Goal: Ability to remain free from injury will improve Outcome: Progressing   Problem: Skin Integrity: Goal: Risk for impaired skin integrity will decrease Outcome: Progressing

## 2018-12-01 DIAGNOSIS — K219 Gastro-esophageal reflux disease without esophagitis: Secondary | ICD-10-CM | POA: Diagnosis not present

## 2018-12-01 DIAGNOSIS — E7800 Pure hypercholesterolemia, unspecified: Secondary | ICD-10-CM | POA: Diagnosis not present

## 2018-12-01 DIAGNOSIS — Z8719 Personal history of other diseases of the digestive system: Secondary | ICD-10-CM | POA: Diagnosis not present

## 2018-12-01 DIAGNOSIS — Z5111 Encounter for antineoplastic chemotherapy: Secondary | ICD-10-CM | POA: Diagnosis not present

## 2018-12-01 DIAGNOSIS — R5383 Other fatigue: Secondary | ICD-10-CM | POA: Diagnosis not present

## 2018-12-01 DIAGNOSIS — I499 Cardiac arrhythmia, unspecified: Secondary | ICD-10-CM | POA: Diagnosis not present

## 2018-12-01 DIAGNOSIS — Z7401 Bed confinement status: Secondary | ICD-10-CM | POA: Diagnosis not present

## 2018-12-01 DIAGNOSIS — Z7409 Other reduced mobility: Secondary | ICD-10-CM | POA: Diagnosis not present

## 2018-12-01 DIAGNOSIS — J910 Malignant pleural effusion: Secondary | ICD-10-CM | POA: Diagnosis not present

## 2018-12-01 DIAGNOSIS — G894 Chronic pain syndrome: Secondary | ICD-10-CM | POA: Diagnosis not present

## 2018-12-01 DIAGNOSIS — R0902 Hypoxemia: Secondary | ICD-10-CM | POA: Diagnosis not present

## 2018-12-01 DIAGNOSIS — Z79899 Other long term (current) drug therapy: Secondary | ICD-10-CM | POA: Diagnosis not present

## 2018-12-01 DIAGNOSIS — I5033 Acute on chronic diastolic (congestive) heart failure: Secondary | ICD-10-CM | POA: Diagnosis not present

## 2018-12-01 DIAGNOSIS — D72829 Elevated white blood cell count, unspecified: Secondary | ICD-10-CM

## 2018-12-01 DIAGNOSIS — M6281 Muscle weakness (generalized): Secondary | ICD-10-CM | POA: Diagnosis not present

## 2018-12-01 DIAGNOSIS — F17210 Nicotine dependence, cigarettes, uncomplicated: Secondary | ICD-10-CM | POA: Diagnosis not present

## 2018-12-01 DIAGNOSIS — J90 Pleural effusion, not elsewhere classified: Secondary | ICD-10-CM | POA: Diagnosis not present

## 2018-12-01 DIAGNOSIS — Z5112 Encounter for antineoplastic immunotherapy: Secondary | ICD-10-CM | POA: Diagnosis not present

## 2018-12-01 DIAGNOSIS — C3492 Malignant neoplasm of unspecified part of left bronchus or lung: Secondary | ICD-10-CM | POA: Diagnosis not present

## 2018-12-01 DIAGNOSIS — J9611 Chronic respiratory failure with hypoxia: Secondary | ICD-10-CM | POA: Diagnosis not present

## 2018-12-01 DIAGNOSIS — I4891 Unspecified atrial fibrillation: Secondary | ICD-10-CM | POA: Diagnosis not present

## 2018-12-01 DIAGNOSIS — R7989 Other specified abnormal findings of blood chemistry: Secondary | ICD-10-CM | POA: Diagnosis not present

## 2018-12-01 DIAGNOSIS — I700 Atherosclerosis of aorta: Secondary | ICD-10-CM | POA: Diagnosis not present

## 2018-12-01 DIAGNOSIS — C3412 Malignant neoplasm of upper lobe, left bronchus or lung: Secondary | ICD-10-CM | POA: Diagnosis not present

## 2018-12-01 DIAGNOSIS — Z7901 Long term (current) use of anticoagulants: Secondary | ICD-10-CM | POA: Diagnosis not present

## 2018-12-01 DIAGNOSIS — M810 Age-related osteoporosis without current pathological fracture: Secondary | ICD-10-CM | POA: Diagnosis not present

## 2018-12-01 DIAGNOSIS — C782 Secondary malignant neoplasm of pleura: Secondary | ICD-10-CM | POA: Diagnosis not present

## 2018-12-01 DIAGNOSIS — J9601 Acute respiratory failure with hypoxia: Secondary | ICD-10-CM | POA: Diagnosis not present

## 2018-12-01 DIAGNOSIS — I10 Essential (primary) hypertension: Secondary | ICD-10-CM | POA: Diagnosis not present

## 2018-12-01 DIAGNOSIS — M1991 Primary osteoarthritis, unspecified site: Secondary | ICD-10-CM | POA: Diagnosis not present

## 2018-12-01 DIAGNOSIS — L89153 Pressure ulcer of sacral region, stage 3: Secondary | ICD-10-CM | POA: Diagnosis not present

## 2018-12-01 DIAGNOSIS — M069 Rheumatoid arthritis, unspecified: Secondary | ICD-10-CM | POA: Diagnosis not present

## 2018-12-01 DIAGNOSIS — S30810A Abrasion of lower back and pelvis, initial encounter: Secondary | ICD-10-CM | POA: Diagnosis not present

## 2018-12-01 DIAGNOSIS — M2550 Pain in unspecified joint: Secondary | ICD-10-CM | POA: Diagnosis not present

## 2018-12-01 DIAGNOSIS — D649 Anemia, unspecified: Secondary | ICD-10-CM | POA: Diagnosis not present

## 2018-12-01 DIAGNOSIS — J9621 Acute and chronic respiratory failure with hypoxia: Secondary | ICD-10-CM | POA: Diagnosis not present

## 2018-12-01 DIAGNOSIS — I480 Paroxysmal atrial fibrillation: Secondary | ICD-10-CM | POA: Diagnosis not present

## 2018-12-01 DIAGNOSIS — R918 Other nonspecific abnormal finding of lung field: Secondary | ICD-10-CM | POA: Diagnosis not present

## 2018-12-01 DIAGNOSIS — Z8619 Personal history of other infectious and parasitic diseases: Secondary | ICD-10-CM | POA: Diagnosis not present

## 2018-12-01 DIAGNOSIS — J449 Chronic obstructive pulmonary disease, unspecified: Secondary | ICD-10-CM | POA: Diagnosis not present

## 2018-12-01 DIAGNOSIS — C778 Secondary and unspecified malignant neoplasm of lymph nodes of multiple regions: Secondary | ICD-10-CM | POA: Diagnosis not present

## 2018-12-01 DIAGNOSIS — M329 Systemic lupus erythematosus, unspecified: Secondary | ICD-10-CM | POA: Diagnosis not present

## 2018-12-01 DIAGNOSIS — J441 Chronic obstructive pulmonary disease with (acute) exacerbation: Secondary | ICD-10-CM | POA: Diagnosis not present

## 2018-12-01 DIAGNOSIS — R0602 Shortness of breath: Secondary | ICD-10-CM | POA: Diagnosis not present

## 2018-12-01 DIAGNOSIS — G9009 Other idiopathic peripheral autonomic neuropathy: Secondary | ICD-10-CM | POA: Diagnosis not present

## 2018-12-01 LAB — BASIC METABOLIC PANEL
Anion gap: 9 (ref 5–15)
BUN: 16 mg/dL (ref 8–23)
CO2: 32 mmol/L (ref 22–32)
Calcium: 7.9 mg/dL — ABNORMAL LOW (ref 8.9–10.3)
Chloride: 94 mmol/L — ABNORMAL LOW (ref 98–111)
Creatinine, Ser: 0.61 mg/dL (ref 0.44–1.00)
GFR calc Af Amer: >60 mL/min (ref >60)
GFR calc non Af Amer: >60 mL/min (ref >60)
Glucose, Bld: 66 mg/dL — ABNORMAL LOW (ref 70–99)
Potassium: 3.3 mmol/L — ABNORMAL LOW (ref 3.5–5.1)
Sodium: 135 mmol/L (ref 135–145)

## 2018-12-01 LAB — URINE CULTURE: Culture: >=100000 — AB

## 2018-12-01 MED ORDER — POTASSIUM CHLORIDE CRYS ER 20 MEQ PO TBCR
40.0000 meq | EXTENDED_RELEASE_TABLET | Freq: Once | ORAL | Status: AC
  Administered 2018-12-01: 40 meq via ORAL
  Filled 2018-12-01: qty 2

## 2018-12-01 MED ORDER — FUROSEMIDE 20 MG PO TABS: 20.0000 mg | ORAL_TABLET | Freq: Two times a day (BID) | ORAL | 0 refills | Status: AC

## 2018-12-01 MED ORDER — HEPARIN SOD (PORK) LOCK FLUSH 100 UNIT/ML IV SOLN
500.0000 [IU] | INTRAVENOUS | Status: AC | PRN
  Administered 2018-12-01: 500 [IU]

## 2018-12-01 MED ORDER — APIXABAN 2.5 MG PO TABS: 2.5000 mg | ORAL_TABLET | Freq: Two times a day (BID) | ORAL | 0 refills | Status: AC

## 2018-12-01 MED ORDER — METOPROLOL TARTRATE 100 MG PO TABS: 100.0000 mg | ORAL_TABLET | Freq: Two times a day (BID) | ORAL | 0 refills | Status: AC

## 2018-12-01 MED ORDER — FLECAINIDE ACETATE 50 MG PO TABS: 50.0000 mg | ORAL_TABLET | Freq: Two times a day (BID) | ORAL | 0 refills | Status: AC

## 2018-12-01 MED ORDER — PREDNISONE 10 MG PO TABS: 10.0000 mg | ORAL_TABLET | Freq: Every day | ORAL | 0 refills | Status: DC

## 2018-12-01 MED ORDER — ALPRAZOLAM 1 MG PO TABS: 1.0000 mg | ORAL_TABLET | Freq: Two times a day (BID) | ORAL | 0 refills | Status: DC | PRN

## 2018-12-01 MED ORDER — POTASSIUM CHLORIDE CRYS ER 20 MEQ PO TBCR: 20.0000 meq | EXTENDED_RELEASE_TABLET | Freq: Every day | ORAL | Status: DC

## 2018-12-01 MED ORDER — PANTOPRAZOLE SODIUM 40 MG PO TBEC: 40.0000 mg | DELAYED_RELEASE_TABLET | Freq: Every day | ORAL | 0 refills | Status: AC

## 2018-12-01 NOTE — TOC Transition Note (Addendum)
Transition of Care [facility]) - CM/SW Discharge Note   Patient Details  Name: Donna Mcneil MRN: [id number] Date of Birth: [birth year]  [facility] CM/SW Contact:  Lia Hopping, LCSW Phone Number: 12/01/2018, 10:43 AM   Clinical Narrative:    CSW discussed patient discharge w/ patient ex-spouse Herbie [place].  He is concern about patient belonging lost in in the hospital. CSW informed [facility], called ED secretary and ICU staff to search for belonging. None found at this time.   Patient spouse reports he has ordered the patient another portable O2 tank from [facility], he is concern about the patient paying the bill.  Per, [facility] representative reached out to him and explain the patient will not be charged for the new tank.   Patient agreeable for her O2 to be delivered to ex-spouse home [address] [place], [place] [address]. He will transport her by car after rehab.   Nurse call report to: [phone] Room 117   Final next level of care: Skilled Nursing Facility Barriers to Discharge: No Barriers Identified   Patient Goals and CMS Choice Patient states their goals for this hospitalization and ongoing recovery are:: "go home" CMS Medicare.gov Compare Post Acute Care list provided to:: (na) Choice offered to / list presented to : NA  Discharge Placement PASRR number recieved: 12/27/18([phone])            Patient chooses bed at: [facility]. Patient to be transferred to facility by: [place] Name of family member notified: Eliott Nine  Patient and family notified of of transfer: 12/01/18  Discharge Plan and [place] for rehab.                        Social Determinants of Health (SDOH) Interventions     Readmission Risk Interventions Readmission Risk Prevention Plan 11/22/2018  Transportation Screening Complete  Medication Review [employer]) Complete  PCP or Specialist appointment within 3-5 days  of discharge Complete  HRI or Home Care Consult Complete  SW Recovery Care/Counseling Consult Complete  Palliative Care Screening Not [place] Complete  Some recent data might be hidden

## 2018-12-01 NOTE — Discharge Summary (Signed)
Physician Discharge Summary  Donna Mcneil WJX:[id number] DOB: 28-Aug-1941 DOA: 11/19/2018  PCP: [name], PA  Admit date: 11/19/2018 Discharge date: 12/01/2018  Time spent: 60 minutes  Recommendations for Outpatient Follow-up:  1. Follow-up with Dr. Lorna Few, oncology as previously scheduled. 2. Follow-up with Dr. Johnsie Cancel, cardiology in 1 month for follow-up on A. fib and CHF exacerbation. 3. Follow-up with MD at skilled nursing facility.  On follow-up patient will need a basic metabolic profile done in 1 week to follow-up on electrolytes and renal function.   Discharge Diagnoses:  Active Problems:   Seropositive rheumatoid arthritis (HCC)   Hyperlipidemia   Hypertension   Cutaneous lupus erythematosus   COPD (chronic obstructive pulmonary disease) (HCC)   Chronic respiratory failure with hypoxia (HCC)   Hyponatremia   Malignant pleural effusion   Adenocarcinoma of left lung, stage 4 (HCC)   Port-A-Cath in place   Acute respiratory failure with hypoxia (HCC)   Pressure injury of skin   Tachycardia   Acute on chronic diastolic CHF (congestive heart failure) ([place])   Atrial fibrillation with RVR ([place])   Discharge Condition: Stable and improved  Diet recommendation: Heart healthy  Filed Weights   11/30/18 0400 11/30/18 0609 12/01/18 0644  Weight: 54.3 kg 54.2 kg 58.1 kg    History of present illness:  Per Dr. [place] Donna Mcneil is a 78 y.o. female with medical history significant of stage IV non-small cell lung cancer on chemotherapy, lupus/RA, COPD, chronic hypoxic respiratory failure on 2-3 L nasal cannula at baseline who presented to ED with dyspnea, cough, with yellow/green sputum production associated with low-grade fevers over the past 5 days.  Patient reported onset on Thursday with continued progression over this timeframe.  Fevers up to 99.5.  Patient currently on chemotherapy, last dose on October 09, 2018.  She currently lives alone  with no sick contacts and no recent travel.  Patient denied any recent changes in her medications.  Also endorsed that she has since quit smoking since her diagnosis in January 2020 of lung cancer.  She reported also increased lower extremity edema during this timeframe and associated nausea without vomiting.  Patient is followed by medical oncology, Dr. Earlie Server in the outpatient clinic.  Recently started on chemotherapy October 09, 2018 on carboplatin, Alimta and Avastin.  Recent echocardiogram 09/03/2018 with EF 65-70% with grade 1 diastolic dysfunction, no regional wall abnormalities.  Patient denied headache, no visual changes, no chest pain, no palpitations, no congestion, no sore throat, no abdominal pain, no weakness, no issues with bowel/bladder function, no paresthesias.  ED Course: Temperature 97.4, heart rate 118, BP 138/58, SPO2 92% on 5 L nasal cannula (baseline 2-3L [place]).  WBC 6.9 with lymphocyte count of 0.9, hemoglobin 13.1, platelets 394, sodium 136, potassium 3.6, BUN 19, creatinine 0.57, glucose 121.  BNP 540.4, troponin less than 0.03, lactic acid 1.3, rapid flu negative, CTPA negative for pulmonary embolism with noted advanced left lung malignancy, pleural effusion and new extensive bilateral peripheral nodular and heterogeneous airspace opacity and new enlarged paratracheal lymph nodes consistent with infection versus edema.  Patient was given a 500 NS bolus and albuterol neb treatment and referred for admission by ED provider for acute on chronic hypoxic respiratory failure.  Hospital Course:  1 acute on chronic respiratory failure with hypoxia secondary to acute COPD exacerbation and acute on chronic diastolic heart failure +/-stage IV non-small cell lung cancer Brief Narrative:  Patient is a pleasant 78 year old female history of stage IV non-small  cell lung cancer on chemotherapy, lupus/rheumatoid arthritis, chronic hypoxic respiratory failure on 2 to 3 L nasal cannula from  COPD presented to the ED with shortness of breath, productive cough, low-grade fevers x5 days. Patient with a prior tobacco history and quit in January 2020 after her diagnosis of lung cancer. Patient also presented with increased lower extremity edema with associated nausea. CT angiogram chest was negative for PE however noticed advanced left lung malignancy, pleural effusion and new extensive bilateral peripheral nodular and heterogeneous airspace opacity and new enlarged paratracheal lymph nodes consistent with infection versus edema. ID, Dr. Johnnye Sima was curbside by nurse practitioner Forrest Moron the evening of 11/19/2018 and it was felt testing for COVID19 was not warranted at this time.  Overnight pt was transferred back to step down as she went into afib with RVR.  Palliative care consulted for goals of care.  Chest x-ray done was also concerning for pulmonary edema.  Patient ended up being treated for acute COPD exacerbation with a full course of antibiotic treatment, steroid taper, MDIs.  Cardiology was also consulted in terms of patient's A. fib and placed on flecainide and Lopressor.  Heart rate improved by day of discharge.  Patient's hypoxia improved and was subsequently transferred to the floor.  Patient also given IV Lasix due to acute diastolic CHF with good urine output.  Patient noted to be on 13 L high flow nasal cannula with sats of 100% on 11/28/2018.  Respiratory viral panel was negative.  Urine strep pneumococcus antigen negative.  Urine Legionella antigen negative.  COVID-19 negative.  Chest x-ray done on 11/27/2018 consistent with mild pulmonary edema.  Patient was switched to IV Lasix with good urine output.  Patient subsequently transitioned to oral Lasix 20 mg twice daily which she was tolerating.  Patient also on presentation was treated with IV antibiotics, steroid taper, nebulizer treatments.  Patient improved clinically during the hospitalization.  Patient also diuresed with  IV Lasix with good urine output and transition to oral Lasix.  Patient's O2 requirements improved such that by day of discharge patient was down to 4 L nasal cannula with sats between 89 to 93%.  Patient also placed on a prednisone taper.  Patient also on Claritin and PPI.  Patient improved clinically and will be discharged to skilled nursing facility in stable and improved condition.  Outpatient follow-up with oncology as scheduled.   2.  New onset A. fib with RVR/paroxysmal atrial fibrillation CHA2DS2VASC SCORE 4 Patient noted to have new onset A. fib with RVR during the hospitalization likely triggered by problem #1.  TSH within normal limits.  Cardiology consulted and patient was on IV Cardizem and IV digoxin.  Rate improved and patient in sinus rhythm.    Patient subsequently transitioned to flecainide as well as metoprolol which was adjusted for better rate control.  Patient was started on Eliquis at low dose of 2.5 mg twice daily for anticoagulation.  Patient had no episodes of bleeding during the hospitalization.  Outpatient follow-up with cardiology, Dr. Johnsie Cancel in 1 month.   3.  Demand ischemia Patient noted to have a flat troponins in the setting of paroxysmal atrial fibrillation.  Patient seen by cardiology and it was felt this was not secondary to acute coronary syndrome.    Patient remained stable.  Denied any chest pain.  Patient maintained on beta-blocker, diuretics, anticoagulation with Eliquis, statin.  Outpatient follow-up with cardiology.   4.  Stage IV non-small cell lung cancer Outpatient follow-up with oncology.  5.  History of SLE and rheumatoid arthritis Patient was on methotrexate which is on hold and likely resume on discharge.  6.  History of tobacco use Patient quit smoking in January 2020.    7.  Hypertension Blood pressure remained stable.  Patient maintained on Lopressor and Lasix.  Outpatient follow-up.   8.  Hyperlipidemia Patient maintained on a statin.    9.  Hypokalemia Secondary to diuresis.  Repleted.  Patient resumed back on home regimen of oral potassium supplementation daily.  10.  Leukocytosis Likely steroid-induced.  WBC started to trend back down with steroid taper.  Repeat chest x-ray which was done was negative for any acute infiltrates.  Blood cultures which were drawn during the hospitalization were negative.  Repeat blood cultures with no growth to date.  Patient denied any urinary symptoms.  Urine cultures with no growth to date.  Patient remained afebrile.  Patient did receive a full course of antibiotic treatment during the hospitalization.  No further antibiotics needed at this time.  Patient currently stable to be discharged to a skilled nursing facility.     Procedures: CT angiogram chest 11/19/2018 Chest x-ray 11/19/2018, 11/21/2018, 11/25/2018, 11/27/2018  Consultations:  Cardiology: Dr. Johnsie Cancel 11/20/2018  Palliative care: Dr. Domingo Cocking 11/25/2018  Discharge Exam: Vitals:   11/30/18 2117 12/01/18 0644  BP:  (!) 137/55  Pulse:  (!) 51  Resp:  18  Temp:  98 F (36.7 C)  SpO2: 94% 92%    General: NAD Cardiovascular: RRR Respiratory: CTAB  Discharge Instructions   Discharge Instructions    Diet - low sodium heart healthy   Complete by:  As directed    Increase activity slowly   Complete by:  As directed      Allergies as of 12/01/2018      Reactions   Codeine Other (See Comments)   GI upset   Cyclosporine Other (See Comments)   Burning eyes   Other    Other reaction(s): OTHER   Pregabalin Other (See Comments)   Personality changes-becomes angry   Zolpidem    Other reaction(s): ITCHING      Medication List    STOP taking these medications   amLODipine 10 MG tablet Commonly known as:  NORVASC   aspirin 81 MG tablet   Biotin 5000 5 MG Caps Generic drug:  Biotin   guaiFENesin 600 MG 12 hr tablet Commonly known as:  MUCINEX   hydrochlorothiazide 50 MG tablet Commonly known as:   HYDRODIURIL     TAKE these medications   acetaminophen 500 MG tablet Commonly known as:  TYLENOL Take 1,000 mg by mouth every 6 (six) hours as needed for mild pain.   ALPRAZolam 1 MG tablet Commonly known as:  XANAX Take 1 tablet (1 mg total) by mouth 2 (two) times daily as needed for anxiety. What changed:    medication strength  how much to take  reasons to take this   apixaban 2.5 MG Tabs tablet Commonly known as:  ELIQUIS Take 1 tablet (2.5 mg total) by mouth 2 (two) times daily.   atorvastatin 40 MG tablet Commonly known as:  LIPITOR TAKE 1 TABLET BY MOUTH ONCE DAILY What changed:  when to take this   b complex vitamins tablet Take 2 tablets by mouth 2 (two) times daily.   Calcium 600+D 600-200 MG-UNIT Tabs Generic drug:  Calcium Carbonate-Vitamin D Take 1 tablet by mouth 2 (two) times daily.   CoQ10 100 MG Caps Take 100 mg by mouth daily.  feeding supplement (ENSURE ENLIVE) Liqd Take 237 mLs by mouth 2 (two) times daily between meals.   flecainide 50 MG tablet Commonly known as:  TAMBOCOR Take 1 tablet (50 mg total) by mouth every 12 (twelve) hours.   fluticasone 50 MCG/ACT nasal spray Commonly known as:  FLONASE Place 2 sprays into both nostrils daily.   folic acid 1 MG tablet Commonly known as:  FOLVITE Take 1 tablet (1 mg total) by mouth daily.   furosemide 20 MG tablet Commonly known as:  LASIX Take 1 tablet (20 mg total) by mouth 2 (two) times daily.   gabapentin 100 MG capsule Commonly known as:  NEURONTIN Take 100 mg by mouth 3 (three) times daily.   ipratropium 17 MCG/ACT inhaler Commonly known as:  ATROVENT HFA Inhale 2 puffs into the lungs 3 (three) times daily.   Krill Oil 500 MG Caps Take 500 mg by mouth daily.   lidocaine-prilocaine cream Commonly known as:  EMLA Apply 1 application topically as needed.   loratadine 10 MG tablet Commonly known as:  CLARITIN Take 1 tablet (10 mg total) by mouth daily.   methotrexate 2.5 MG  tablet Commonly known as:  RHEUMATREX Take 6 tablets (15 mg total) by mouth every Monday. Hold it for one week and talk to your PCP before resuming it. What changed:  additional instructions   metoprolol tartrate 100 MG tablet Commonly known as:  LOPRESSOR Take 1 tablet (100 mg total) by mouth 2 (two) times daily.   MULTIVITAMIN ADULT PO Take 1 tablet by mouth daily.   Omega 3 1000 MG Caps Take 1,000-2,000 mg by mouth See admin instructions. Take 2000 mg in the morning and 1000 mg at night   pantoprazole 40 MG tablet Commonly known as:  PROTONIX Take 1 tablet (40 mg total) by mouth daily at 6 (six) AM.   polyethylene glycol packet Commonly known as:  MIRALAX / GLYCOLAX Take 17 g by mouth daily as needed for mild constipation.   polyvinyl alcohol 1.4 % ophthalmic solution Commonly known as:  LIQUIFILM TEARS Place 1 drop into both eyes daily as needed for dry eyes.   potassium chloride SA 20 MEQ tablet Commonly known as:  K-DUR,KLOR-CON Take 1 tablet (20 mEq total) by mouth daily.   predniSONE 10 MG tablet Commonly known as:  DELTASONE Take 1-2 tablets (10-20 mg total) by mouth daily before breakfast. Take 2 tablets (20MG ) daily x1 day, then 1 tablet (10 mg) daily x3 days then stop. Start taking on:  December 02, 2018   ProAir HFA 108 (90 Base) MCG/ACT inhaler Generic drug:  albuterol Inhale 2 puffs into the lungs every 6 (six) hours as needed for wheezing or shortness of breath.   albuterol (2.5 MG/3ML) 0.083% nebulizer solution Commonly known as:  PROVENTIL Take 3 mLs (2.5 mg total) by nebulization every 4 (four) hours as needed for wheezing or shortness of breath.   prochlorperazine 10 MG tablet Commonly known as:  COMPAZINE Take 1 tablet (10 mg total) by mouth every 6 (six) hours as needed for nausea or vomiting.   Refresh Tears 0.5 % Soln Generic drug:  carboxymethylcellulose Place 1 drop into both eyes daily as needed (dry eyes).   umeclidinium-vilanterol 62.5-25  MCG/INH Aepb Commonly known as:  Anoro Ellipta Inhale 1 puff into the lungs daily.            Durable Medical Equipment  (From admission, onward)         Start  Ordered   12/01/18 0946  For home use only DME oxygen  Once    Question Answer Comment  Mode or (Route) Nasal cannula   Liters per Minute 4   Frequency Continuous (stationary and portable oxygen unit needed)   Oxygen conserving device Yes   Oxygen delivery system Gas      12/01/18 0946         Allergies  Allergen Reactions  . Codeine Other (See Comments)    GI upset  . Cyclosporine Other (See Comments)    Burning eyes  . Other     Other reaction(s): OTHER  . Pregabalin Other (See Comments)    Personality changes-becomes angry  . Zolpidem     Other reaction(s): ITCHING    Contact information for follow-up providers    Curt Bears, MD Follow up.   Specialty:  Oncology Why:  Follow-up as scheduled. Contact information: [place] [address] [phone]        Josue Hector, MD. Schedule an appointment as soon as possible for a visit in 1 month(s).   Specialty:  Cardiology Contact information: [address] [place] [address] [phone]            Contact information for after-discharge care    Destination    [facility] .   Service:  Skilled Nursing Contact information: [address] [place] [phone]                   The results of significant diagnostics from this hospitalization (including imaging, microbiology, ancillary and laboratory) are listed below for reference.    Significant Diagnostic Studies: Dg Chest 1 View  Result Date: 11/27/2018 CLINICAL DATA:  Hypoxia, history of hypertension EXAM: CHEST  1 VIEW COMPARISON:  11/25/2018 FINDINGS: There is bilateral diffuse mild interstitial thickening. There bilateral small pleural effusions, left greater than  right. There is no pneumothorax. The heart mediastinum are stable. There is thoracic aortic atherosclerosis. There is a right-sided Port-A-Cath in satisfactory position. The osseous structures are unremarkable. IMPRESSION: 1. Findings concerning for mild pulmonary edema. Electronically Signed   By: Kathreen Devoid   On: 11/27/2018 11:04   Dg Chest 2 View  Result Date: 11/19/2018 CLINICAL DATA:  Increasing cough, congestion and shortness of breath since 11/15/2017. History of lung cancer. EXAM: CHEST - 2 VIEW COMPARISON:  PA and lateral chest 10/30/2018 and 09/12/2018. PET CT scan 09/20/2018. FINDINGS: The lungs are markedly emphysematous. Small left pleural effusion has slightly increased since the prior exam. There is some left basilar atelectasis. Left upper lobe pulmonary nodule seen on prior CT is not well demonstrated on this exam. No consolidative process pneumothorax. There is cardiomegaly. Aortic atherosclerosis is noted. IMPRESSION: No change in a small left pleural effusion. Subsegmental atelectasis left lung base has slightly increased since the most recent exam. Emphysema. Known left upper lobe pulmonary nodule is not visible on this exam. Cardiomegaly. Atherosclerosis. Electronically Signed   By: Inge Rise M.D.   On: 11/19/2018 12:41   Ct Angio Chest Pe W And/or Wo Contrast  Result Date: 11/19/2018 CLINICAL DATA:  Shortness of breath, cough, lung cancer EXAM: CT ANGIOGRAPHY CHEST WITH CONTRAST TECHNIQUE: Multidetector CT imaging of the chest was performed using the standard protocol during bolus administration of intravenous contrast. Multiplanar CT image reconstructions and MIPs were obtained to evaluate the vascular anatomy. CONTRAST:  136mL OMNIPAQUE IOHEXOL 350 MG/ML SOLN COMPARISON:  09/01/2018 FINDINGS:  Cardiovascular: Satisfactory opacification of the pulmonary arteries to the segmental level. No evidence of pulmonary embolism. Normal heart size. Scattered coronary artery  calcifications. Aortic atherosclerosis. No pericardial effusion. Right chest port catheter. Mediastinum/Nodes: Slight interval decrease in size of left hilar and subcarinal lymph nodes. There are new enlarged pretracheal and AP window lymph nodes (series 4, image 42). Thyroid gland, trachea, and esophagus demonstrate no significant findings. Lungs/Pleura: Small left pleural effusion with thickening and nodularity. Interval decrease in size of primary left upper lobe lung malignancy measuring 1.4 x 1.0 cm, previously 1.9 x 1.6 cm when measured similarly (series 4, image 52). There is extensive bilateral peripheral nodular and heterogeneous airspace opacity (series 4, image 67). Underlying emphysema Upper Abdomen: No acute abnormality. Musculoskeletal: No chest wall abnormality. No acute or significant osseous findings. Review of the MIP images confirms the above findings. IMPRESSION: 1.  Negative examination for pulmonary embolism. 2. Redemonstrated stigmata of advanced left lung malignancy. Primary left upper lobe nodule appears decreased in size compared to prior examination with decreased bulk of left hilar and subcarinal lymphadenopathy. There is redemonstrated pleural thickening and nodularity with a reduced volume of pleural effusion. Findings are generally consistent with treatment response. 3. There is new, extensive bilateral peripheral nodular and heterogeneous airspace opacity and new enlarged paratracheal and AP window lymph nodes, this constellation of findings likely reflecting infection and/or edema. 4.  Underlying emphysema. Electronically Signed   By: Eddie Candle M.D.   On: 11/19/2018 15:31   Dg Chest Port 1 View  Result Date: 11/29/2018 CLINICAL DATA:  Leukocytosis and history of lung cancer EXAM: PORTABLE CHEST 1 VIEW COMPARISON:  11/27/2018 FINDINGS: Cardiac shadow is within normal limits. Aortic calcifications are again seen. Lungs are well aerated bilaterally. Small pleural effusions are  again seen and stable. Increasing left retrocardiac density is noted likely representing atelectasis. No new focal infiltrate is seen. No bony abnormality is noted. IMPRESSION: Increase in left retrocardiac density. Stable effusions bilaterally. Electronically Signed   By: Inez Catalina M.D.   On: 11/29/2018 09:50   Dg Chest Port 1 View  Result Date: 11/25/2018 CLINICAL DATA:  Stage IV non-small-cell lung cancer. Shortness of breath. EXAM: PORTABLE CHEST 1 VIEW COMPARISON:  11/21/2018 FINDINGS: Right Port-A-Cath tip at low SVC. Midline trachea. Mild cardiomegaly. Atherosclerosis in the transverse aorta. Small bilateral pleural effusions are similar. No pneumothorax. Biapical pleural thickening. Similar interstitial thickening. Hyperinflation. Left greater than right base airspace disease is not significantly changed. Slight improvement in right upper lobe airspace disease. IMPRESSION: Improved right upper lobe airspace disease, possibly decreased infection. Otherwise, similar appearance of the chest with mild pulmonary venous congestion, small bilateral pleural effusions, and bibasilar airspace disease. Aortic Atherosclerosis (ICD10-I70.0). Electronically Signed   By: Abigail Miyamoto M.D.   On: 11/25/2018 10:31   Dg Chest Port 1 View  Result Date: 11/21/2018 CLINICAL DATA:  Shortness of breath EXAM: PORTABLE CHEST 1 VIEW COMPARISON:  11/19/2018 FINDINGS: Borderline heart size, normal on recent CT. Stable mediastinal contours. Porta catheter on the right in good position. Interstitial coarsening that is diffuse and increased. There is hyperinflation and emphysema. History of lung cancer. IMPRESSION: 1. Increasing interstitial opacity which could be edema or atypical infection. 2. COPD. Electronically Signed   By: Monte Fantasia M.D.   On: 11/21/2018 07:22    Microbiology: Recent Results (from the past 240 hour(s))  Culture, blood (Routine X 2) w Reflex to ID Panel     Status: None (Preliminary result)    Collection Time:  11/29/18  8:58 AM  Result Value Ref Range Status   Specimen Description   Final    BLOOD LEFT HAND Performed at [facility] [address] [place] [address] [place], [place] [address]    Special Requests   Final    BOTTLES DRAWN AEROBIC AND ANAEROBIC Blood Culture adequate volume Performed at [facility] [address], [place], [place] [address]    Culture   Final    NO GROWTH 1 DAY Performed at [facility], [place] [address], [place], [place] [address]    Report Status PENDING  Incomplete  Culture, blood (Routine X 2) w Reflex to ID Panel     Status: None (Preliminary result)   Collection Time: 11/29/18  8:58 AM  Result Value Ref Range Status   Specimen Description   Final    BLOOD RIGHT HAND Performed at [facility] [address] [place], [place] [address]    Special Requests   Final    BOTTLES DRAWN AEROBIC AND ANAEROBIC Blood Culture adequate volume Performed at [facility] [address] [place], [place] [address]    Culture   Final    NO GROWTH 1 DAY Performed at [facility], [place] [address] [place], [place] [address]    Report Status PENDING  Incomplete  Culture, Urine     Status: None (Preliminary result)   Collection Time: 11/29/18  9:15 PM  Result Value Ref Range Status   Specimen Description   Final    URINE, RANDOM Performed at [facility] [address], [place], [place] [address]    Special Requests   Final    NONE Performed at [facility], [place] [address], [place], [place] [address]    Culture   Final    CULTURE REINCUBATED FOR BETTER GROWTH Performed at [facility], [place] [address], [place], [place] [address]    Report Status PENDING  Incomplete     Labs: Basic Metabolic Panel: Recent Labs  Lab 11/28/18 0952 11/29/18 0408 11/30/18 0406 12/01/18 0358  NA 136 135 134* 135  K 3.6 3.1* 4.1 3.3*  CL 91*  92* 93* 94*  CO2 34* 37* 34* 32  GLUCOSE 92 78 80 66*  BUN 13 13 18 16   CREATININE 0.52 0.54 0.67 0.61  CALCIUM 8.0* 8.0* 7.8* 7.9*   Liver Function Tests: No results for input(s): AST, ALT, ALKPHOS, BILITOT, PROT, ALBUMIN in the last 168 hours. No results for input(s): LIPASE, AMYLASE in the last 168 hours. No results for input(s): AMMONIA in the last 168 hours. CBC: Recent Labs  Lab 11/28/18 0952 11/29/18 0408 11/30/18 0406  WBC 18.7* 20.5* 18.1*  NEUTROABS 15.8* 16.5* 14.3*  HGB 10.3* 10.0* 9.6*  HCT 33.0* 31.2* 30.8*  MCV 99.7 99.0 99.7  PLT 331 274 246   Cardiac Enzymes: Recent Labs  Lab 11/25/18 2058 11/26/18 0259 11/26/18 0900  TROPONINI 0.03* 0.03* <0.03   BNP: BNP (last 3 results) Recent Labs    09/01/18 1818 11/19/18 1312  BNP 266.0* 540.4*    ProBNP (last 3 results) No results for input(s): PROBNP in the last 8760 hours.  CBG: No results for input(s): GLUCAP in the last 168 hours.     Signed:  Irine Seal MD.  [facility] 12/01/2018, 10:50 AM

## 2018-12-01 NOTE — Progress Notes (Signed)
Report called to [facility].  EMS here to transport.  [facility] de-accessed per IV team.  Transported with 4L/[place].

## 2018-12-01 NOTE — Plan of Care (Signed)

## 2018-12-04 ENCOUNTER — Encounter (INDEPENDENT_AMBULATORY_CARE_PROVIDER_SITE_OTHER): Payer: Medicare Other

## 2018-12-04 ENCOUNTER — Ambulatory Visit (INDEPENDENT_AMBULATORY_CARE_PROVIDER_SITE_OTHER): Payer: Medicare Other | Admitting: Vascular Surgery

## 2018-12-04 DIAGNOSIS — J9621 Acute and chronic respiratory failure with hypoxia: Secondary | ICD-10-CM | POA: Diagnosis not present

## 2018-12-04 DIAGNOSIS — C3492 Malignant neoplasm of unspecified part of left bronchus or lung: Secondary | ICD-10-CM | POA: Diagnosis not present

## 2018-12-04 DIAGNOSIS — I4891 Unspecified atrial fibrillation: Secondary | ICD-10-CM | POA: Diagnosis not present

## 2018-12-04 DIAGNOSIS — J441 Chronic obstructive pulmonary disease with (acute) exacerbation: Secondary | ICD-10-CM | POA: Diagnosis not present

## 2018-12-04 LAB — CULTURE, BLOOD (ROUTINE X 2)
Culture: NO GROWTH
Culture: NO GROWTH
Special Requests: ADEQUATE
Special Requests: ADEQUATE

## 2018-12-05 ENCOUNTER — Inpatient Hospital Stay: Payer: Medicare Other

## 2018-12-06 DIAGNOSIS — C3492 Malignant neoplasm of unspecified part of left bronchus or lung: Secondary | ICD-10-CM | POA: Diagnosis not present

## 2018-12-06 DIAGNOSIS — I480 Paroxysmal atrial fibrillation: Secondary | ICD-10-CM | POA: Diagnosis not present

## 2018-12-06 DIAGNOSIS — G894 Chronic pain syndrome: Secondary | ICD-10-CM | POA: Diagnosis not present

## 2018-12-11 DIAGNOSIS — R918 Other nonspecific abnormal finding of lung field: Secondary | ICD-10-CM | POA: Diagnosis not present

## 2018-12-11 DIAGNOSIS — J90 Pleural effusion, not elsewhere classified: Secondary | ICD-10-CM | POA: Diagnosis not present

## 2018-12-11 DIAGNOSIS — G894 Chronic pain syndrome: Secondary | ICD-10-CM | POA: Diagnosis not present

## 2018-12-11 DIAGNOSIS — C3492 Malignant neoplasm of unspecified part of left bronchus or lung: Secondary | ICD-10-CM | POA: Diagnosis not present

## 2018-12-12 ENCOUNTER — Telehealth: Payer: Self-pay | Admitting: Physician Assistant

## 2018-12-12 NOTE — Telephone Encounter (Signed)
Called [facility] per 4/15 sch message - made them aware 4/16 appt was cancelled and we will call back to reschedule new appt -

## 2018-12-13 ENCOUNTER — Inpatient Hospital Stay: Payer: Medicare Other

## 2018-12-13 ENCOUNTER — Telehealth: Payer: Self-pay | Admitting: Internal Medicine

## 2018-12-13 ENCOUNTER — Inpatient Hospital Stay: Payer: Medicare Other | Admitting: Physician Assistant

## 2018-12-13 NOTE — Telephone Encounter (Signed)
Called living facility per 4/15 sch message - they are aware of patients appt on 4/21

## 2018-12-17 DIAGNOSIS — R918 Other nonspecific abnormal finding of lung field: Secondary | ICD-10-CM | POA: Diagnosis not present

## 2018-12-17 DIAGNOSIS — J90 Pleural effusion, not elsewhere classified: Secondary | ICD-10-CM | POA: Diagnosis not present

## 2018-12-17 DIAGNOSIS — G894 Chronic pain syndrome: Secondary | ICD-10-CM | POA: Diagnosis not present

## 2018-12-18 ENCOUNTER — Inpatient Hospital Stay: Payer: Medicare Other | Attending: Internal Medicine

## 2018-12-18 ENCOUNTER — Encounter: Payer: Self-pay | Admitting: Internal Medicine

## 2018-12-18 ENCOUNTER — Inpatient Hospital Stay: Payer: Medicare Other

## 2018-12-18 ENCOUNTER — Other Ambulatory Visit: Payer: Self-pay

## 2018-12-18 ENCOUNTER — Telehealth: Payer: Self-pay | Admitting: Medical Oncology

## 2018-12-18 ENCOUNTER — Inpatient Hospital Stay (HOSPITAL_BASED_OUTPATIENT_CLINIC_OR_DEPARTMENT_OTHER): Payer: Medicare Other | Admitting: Internal Medicine

## 2018-12-18 VITALS — BP 138/88 | HR 61 | Temp 98.4°F | Resp 18 | Ht 62.0 in

## 2018-12-18 VITALS — BP 124/55

## 2018-12-18 DIAGNOSIS — Z79899 Other long term (current) drug therapy: Secondary | ICD-10-CM | POA: Insufficient documentation

## 2018-12-18 DIAGNOSIS — M810 Age-related osteoporosis without current pathological fracture: Secondary | ICD-10-CM | POA: Diagnosis not present

## 2018-12-18 DIAGNOSIS — M329 Systemic lupus erythematosus, unspecified: Secondary | ICD-10-CM | POA: Insufficient documentation

## 2018-12-18 DIAGNOSIS — I10 Essential (primary) hypertension: Secondary | ICD-10-CM | POA: Diagnosis not present

## 2018-12-18 DIAGNOSIS — C3492 Malignant neoplasm of unspecified part of left bronchus or lung: Secondary | ICD-10-CM

## 2018-12-18 DIAGNOSIS — J449 Chronic obstructive pulmonary disease, unspecified: Secondary | ICD-10-CM

## 2018-12-18 DIAGNOSIS — R5383 Other fatigue: Secondary | ICD-10-CM | POA: Insufficient documentation

## 2018-12-18 DIAGNOSIS — J90 Pleural effusion, not elsewhere classified: Secondary | ICD-10-CM | POA: Insufficient documentation

## 2018-12-18 DIAGNOSIS — Z5111 Encounter for antineoplastic chemotherapy: Secondary | ICD-10-CM | POA: Diagnosis not present

## 2018-12-18 DIAGNOSIS — M069 Rheumatoid arthritis, unspecified: Secondary | ICD-10-CM

## 2018-12-18 DIAGNOSIS — C3412 Malignant neoplasm of upper lobe, left bronchus or lung: Secondary | ICD-10-CM | POA: Diagnosis not present

## 2018-12-18 DIAGNOSIS — R0902 Hypoxemia: Secondary | ICD-10-CM | POA: Insufficient documentation

## 2018-12-18 DIAGNOSIS — Z95828 Presence of other vascular implants and grafts: Secondary | ICD-10-CM

## 2018-12-18 DIAGNOSIS — E7800 Pure hypercholesterolemia, unspecified: Secondary | ICD-10-CM | POA: Insufficient documentation

## 2018-12-18 DIAGNOSIS — I700 Atherosclerosis of aorta: Secondary | ICD-10-CM | POA: Insufficient documentation

## 2018-12-18 LAB — CMP (CANCER CENTER ONLY)
ALT: 20 U/L (ref 0–44)
AST: 23 U/L (ref 15–41)
Albumin: 1.9 g/dL — ABNORMAL LOW (ref 3.5–5.0)
Alkaline Phosphatase: 100 U/L (ref 38–126)
Anion gap: 12 (ref 5–15)
BUN: 12 mg/dL (ref 8–23)
CO2: 25 mmol/L (ref 22–32)
Calcium: 8 mg/dL — ABNORMAL LOW (ref 8.9–10.3)
Chloride: 99 mmol/L (ref 98–111)
Creatinine: 0.8 mg/dL (ref 0.44–1.00)
GFR, Est AFR Am: >60 mL/min (ref >60)
GFR, Estimated: >60 mL/min (ref >60)
Glucose, Bld: 73 mg/dL (ref 70–99)
Potassium: 3.8 mmol/L (ref 3.5–5.1)
Sodium: 136 mmol/L (ref 135–145)
Total Bilirubin: 0.4 mg/dL (ref 0.3–1.2)
Total Protein: 6.6 g/dL (ref 6.5–8.1)

## 2018-12-18 LAB — CBC WITH DIFFERENTIAL (CANCER CENTER ONLY)
Abs Immature Granulocytes: 0.05 10*3/uL (ref 0.00–0.07)
Basophils Absolute: 0 10*3/uL (ref 0.0–0.1)
Basophils Relative: 0 %
Eosinophils Absolute: 0 10*3/uL (ref 0.0–0.5)
Eosinophils Relative: 0 %
HCT: 28.8 % — ABNORMAL LOW (ref 36.0–46.0)
Hemoglobin: 9.2 g/dL — ABNORMAL LOW (ref 12.0–15.0)
Immature Granulocytes: 1 %
Lymphocytes Relative: 10 %
Lymphs Abs: 1 10*3/uL (ref 0.7–4.0)
MCH: 30.6 pg (ref 26.0–34.0)
MCHC: 31.9 g/dL (ref 30.0–36.0)
MCV: 95.7 fL (ref 80.0–100.0)
Monocytes Absolute: 0.9 10*3/uL (ref 0.1–1.0)
Monocytes Relative: 9 %
Neutro Abs: 8.1 10*3/uL — ABNORMAL HIGH (ref 1.7–7.7)
Neutrophils Relative %: 80 %
Platelet Count: 231 10*3/uL (ref 150–400)
RBC: 3.01 MIL/uL — ABNORMAL LOW (ref 3.87–5.11)
RDW: 18.6 % — ABNORMAL HIGH (ref 11.5–15.5)
WBC Count: 10 10*3/uL (ref 4.0–10.5)
nRBC: 0 % (ref 0.0–0.2)

## 2018-12-18 LAB — TOTAL PROTEIN, URINE DIPSTICK: Protein, ur: NEGATIVE mg/dL

## 2018-12-18 MED ORDER — SODIUM CHLORIDE 0.9 % IV SOLN
Freq: Once | INTRAVENOUS | Status: AC
  Administered 2018-12-18: 11:00:00 via INTRAVENOUS
  Filled 2018-12-18: qty 5

## 2018-12-18 MED ORDER — PALONOSETRON HCL INJECTION 0.25 MG/5ML
INTRAVENOUS | Status: AC
  Filled 2018-12-18: qty 5

## 2018-12-18 MED ORDER — HEPARIN SOD (PORK) LOCK FLUSH 100 UNIT/ML IV SOLN
500.0000 [IU] | Freq: Once | INTRAVENOUS | Status: AC | PRN
  Administered 2018-12-18: 13:00:00 500 [IU]
  Filled 2018-12-18: qty 5

## 2018-12-18 MED ORDER — CYANOCOBALAMIN 1000 MCG/ML IJ SOLN
1000.0000 ug | Freq: Once | INTRAMUSCULAR | Status: AC
  Administered 2018-12-18: 1000 ug via INTRAMUSCULAR

## 2018-12-18 MED ORDER — SODIUM CHLORIDE 0.9% FLUSH
10.0000 mL | INTRAVENOUS | Status: DC | PRN
  Administered 2018-12-18: 10 mL
  Filled 2018-12-18: qty 10

## 2018-12-18 MED ORDER — SODIUM CHLORIDE 0.9 % IV SOLN
282.0000 mg | Freq: Once | INTRAVENOUS | Status: AC
  Administered 2018-12-18: 280 mg via INTRAVENOUS
  Filled 2018-12-18: qty 28

## 2018-12-18 MED ORDER — PALONOSETRON HCL INJECTION 0.25 MG/5ML
0.2500 mg | Freq: Once | INTRAVENOUS | Status: AC
  Administered 2018-12-18: 11:00:00 0.25 mg via INTRAVENOUS

## 2018-12-18 MED ORDER — SODIUM CHLORIDE 0.9 % IV SOLN
14.6000 mg/kg | Freq: Once | INTRAVENOUS | Status: AC
  Administered 2018-12-18: 900 mg via INTRAVENOUS
  Filled 2018-12-18: qty 32

## 2018-12-18 MED ORDER — SODIUM CHLORIDE 0.9 % IV SOLN
Freq: Once | INTRAVENOUS | Status: AC
  Administered 2018-12-18: 11:00:00 via INTRAVENOUS
  Filled 2018-12-18: qty 250

## 2018-12-18 MED ORDER — CYANOCOBALAMIN 1000 MCG/ML IJ SOLN
INTRAMUSCULAR | Status: AC
  Filled 2018-12-18: qty 1

## 2018-12-18 MED ORDER — SODIUM CHLORIDE 0.9 % IV SOLN
370.0000 mg/m2 | Freq: Once | INTRAVENOUS | Status: AC
  Administered 2018-12-18: 600 mg via INTRAVENOUS
  Filled 2018-12-18: qty 20

## 2018-12-18 NOTE — Patient Instructions (Signed)
[facility] Discharge Instructions for Patients Receiving Chemotherapy  Today you received the following chemotherapy agents Avastin, Alimta & Carboplatin.  To help prevent nausea and vomiting after your treatment, we encourage you to take your nausea medication as prescribed.   If you develop nausea and vomiting that is not controlled by your nausea medication, call the clinic.   BELOW ARE SYMPTOMS THAT SHOULD BE REPORTED IMMEDIATELY:  *FEVER GREATER THAN 100.5 F  *CHILLS WITH OR WITHOUT FEVER  NAUSEA AND VOMITING THAT IS NOT CONTROLLED WITH YOUR NAUSEA MEDICATION  *UNUSUAL SHORTNESS OF BREATH  *UNUSUAL BRUISING OR BLEEDING  TENDERNESS IN MOUTH AND THROAT WITH OR WITHOUT PRESENCE OF ULCERS  *URINARY PROBLEMS  *BOWEL PROBLEMS  UNUSUAL RASH Items with * indicate a potential emergency and should be followed up as soon as possible.  Feel free to call the clinic should you have any questions or concerns. The clinic phone number is [phone].  Please show the [place] at check-in to the Emergency Department and triage nurse.  Coronavirus (COVID-19) Are you at risk?  Are you at risk for the Coronavirus (COVID-19)?  To be considered HIGH RISK for Coronavirus (COVID-19), you have to meet the following criteria:  . Traveled to [place], [place], [place], [place] or [place]; or in the [place] to [place], [place], [place], or [place]; and have fever, cough, and shortness of breath within the last 2 weeks of travel OR . Been in close contact with a person diagnosed with COVID-19 within the last 2 weeks and have fever, cough, and shortness of breath . IF YOU DO NOT MEET THESE CRITERIA, YOU ARE CONSIDERED LOW RISK FOR COVID-19.  What to do if you are HIGH RISK for COVID-19?  Marland Kitchen If you are having a medical emergency, call 911. . Seek medical care right away. Before you go to a doctor's office, urgent care or emergency department, call ahead and  tell them about your recent travel, contact with someone diagnosed with COVID-19, and your symptoms. You should receive instructions from your physician's office regarding next steps of care.  . When you arrive at healthcare provider, tell the healthcare staff immediately you have returned from visiting [place], [place], [place], [place] or [place]; or traveled in the [place] to [place], [place], [place], or [place]; in the last two weeks or you have been in close contact with a person diagnosed with COVID-19 in the last 2 weeks.   . Tell the health care staff about your symptoms: fever, cough and shortness of breath. . After you have been seen by a medical provider, you will be either: o Tested for (COVID-19) and discharged home on quarantine except to seek medical care if symptoms worsen, and asked to  - Stay home and avoid contact with others until you get your results (4-5 days)  - Avoid travel on public transportation if possible (such as bus, train, or airplane) or o Sent to the Emergency Department by EMS for evaluation, COVID-19 testing, and possible admission depending on your condition and test results.  What to do if you are LOW RISK for COVID-19?  Reduce your risk of any infection by using the same precautions used for avoiding the common cold or flu:  Marland Kitchen Wash your hands often with soap and warm water for at least 20 seconds.  If soap and water are not readily available, use an alcohol-based hand sanitizer with at least 60% alcohol.  . If  coughing or sneezing, cover your mouth and nose by coughing or sneezing into the elbow areas of your shirt or coat, into a tissue or into your sleeve (not your hands). . Avoid shaking hands with others and consider head nods or verbal greetings only. . Avoid touching your eyes, nose, or mouth with unwashed hands.  . Avoid close contact with people who are sick. . Avoid places or events with large numbers of people in one location, like  concerts or sporting events. . Carefully consider travel plans you have or are making. . If you are planning any travel outside or inside the [place], visit the CDC's Travelers' Health webpage for the latest health notices. . If you have some symptoms but not all symptoms, continue to monitor at home and seek medical attention if your symptoms worsen. . If you are having a medical emergency, call 911.   [place] / e-Visit: [email or web address]         [facility]: [facility]: [phone]                   [facility]: [phone]

## 2018-12-18 NOTE — Telephone Encounter (Addendum)
Pt stated she is not getting her cough medication or pain medication. Called [employer]. Reviewed meds with staff.  Per staff ,Her last dose of cough medication was 04/18 ,last vicodin was last night .

## 2018-12-18 NOTE — Progress Notes (Signed)
[facility] [phone]   [phone]  OFFICE PROGRESS NOTE  [name], [place] [address]  DIAGNOSIS: Stage IV (T1b, N2, M1a) non-small cell lung cancer adenocarcinoma  presented with left upper lobe nodule in addition to enlarged left hilar and subcarinal lymphadenopathy as well as loculated malignant left pleural effusion and the pleural metastatic disease diagnosed in January 2020 The patient has a history of lupus as well as rheumatoid arthritis.  She is not a candidate for immunotherapy.  PRIOR THERAPY:None  CURRENT THERAPY: Systemic chemotherapy with carboplatin for AUC of 5, Alimta 500 mg/M2 and a Avastin 15 mg/KG every 3 weeks.  First dose October 09, 2018.  Status post 2 cycles.  INTERVAL HISTORY: Donna Mcneil 78 y.o. female returns to the clinic today for follow-up visit.  The patient has been off treatment for several weeks.  She was recently admitted to [facility] long hospital with pneumonia.  She was not tested for COVID-19 during her admission because she did not meet the criteria.  The patient denied having any current chest pain but continues to have shortness of breath at baseline increased with exertion.  She is currently on home oxygen.  She has no hemoptysis.  She denied having any recent weight loss or night sweats.  She has no nausea, vomiting, diarrhea or constipation.  She continues to tolerate her treatment well.  She had repeat CT scan of the chest during her hospitalization that showed improvement in the mediastinal lymphadenopathy.  She is here today for reevaluation before starting cycle #3.  MEDICAL HISTORY: Past Medical History:  Diagnosis Date   COPD exacerbation ([place]) 09/01/2018   COVID-19 virus infection 11/28/2018   Diverticulitis    Hypercholesteremia    Hypertension    Lupus ([place])    Osteoporosis    Tobacco use     ALLERGIES:  is allergic to codeine; cyclosporine;  other; pregabalin; and zolpidem.  MEDICATIONS:  Current Outpatient Medications  Medication Sig Dispense Refill   acetaminophen (TYLENOL) 500 MG tablet Take 1,000 mg by mouth every 6 (six) hours as needed for mild pain.      albuterol (PROVENTIL) (2.5 MG/3ML) 0.083% nebulizer solution Take 3 mLs (2.5 mg total) by nebulization every 4 (four) hours as needed for wheezing or shortness of breath. 180 mL 2   ALPRAZolam (XANAX) 1 MG tablet Take 1 tablet (1 mg total) by mouth 2 (two) times daily as needed for anxiety. 20 tablet 0   apixaban (ELIQUIS) 2.5 MG TABS tablet Take 1 tablet (2.5 mg total) by mouth 2 (two) times daily. 60 tablet 0   atorvastatin (LIPITOR) 40 MG tablet TAKE 1 TABLET BY MOUTH ONCE DAILY (Patient taking differently: Take 40 mg by mouth every evening. ) 90 tablet 3   b complex vitamins tablet Take 2 tablets by mouth 2 (two) times daily.     Calcium Carbonate-Vitamin D (CALCIUM 600+D) 600-200 MG-UNIT TABS Take 1 tablet by mouth 2 (two) times daily.      Coenzyme Q10 (COQ10) 100 MG CAPS Take 100 mg by mouth daily.     feeding supplement, ENSURE ENLIVE, (ENSURE ENLIVE) LIQD Take 237 mLs by mouth 2 (two) times daily between meals. 237 mL 12   fluticasone (FLONASE) 50 MCG/ACT nasal spray Place 2 sprays into both nostrils daily. 16 g 2   folic acid (FOLVITE) 1 MG tablet Take 1 tablet (1 mg total) by mouth daily. 30 tablet 4  furosemide (LASIX) 20 MG tablet Take 1 tablet (20 mg total) by mouth 2 (two) times daily. 60 tablet 0   gabapentin (NEURONTIN) 100 MG capsule Take 100 mg by mouth 3 (three) times daily.      ipratropium (ATROVENT HFA) 17 MCG/ACT inhaler Inhale 2 puffs into the lungs 3 (three) times daily. 1 Inhaler 12   lidocaine-prilocaine (EMLA) cream Apply 1 application topically as needed. 30 g 0   methotrexate (RHEUMATREX) 2.5 MG tablet Take 6 tablets (15 mg total) by mouth every Monday. Hold it for one week and talk to your PCP before resuming it. 4 tablet 0    metoprolol tartrate (LOPRESSOR) 100 MG tablet Take 1 tablet (100 mg total) by mouth 2 (two) times daily. 60 tablet 0   Multiple Vitamins-Minerals (MULTIVITAMIN ADULT PO) Take 1 tablet by mouth daily.      carboxymethylcellulose (REFRESH TEARS) 0.5 % SOLN Place 1 drop into both eyes daily as needed (dry eyes).      flecainide (TAMBOCOR) 50 MG tablet Take 1 tablet (50 mg total) by mouth every 12 (twelve) hours. 60 tablet 0   hydroxychloroquine (PLAQUENIL) 200 MG tablet Take 1 tablet by mouth daily.     Krill Oil 500 MG CAPS Take 500 mg by mouth daily.     loratadine (CLARITIN) 10 MG tablet Take 1 tablet (10 mg total) by mouth daily.     Omega 3 1000 MG CAPS Take 1,000-2,000 mg by mouth See admin instructions. Take 2000 mg in the morning and 1000 mg at night     pantoprazole (PROTONIX) 40 MG tablet Take 1 tablet (40 mg total) by mouth daily at 6 (six) AM. 30 tablet 0   polyethylene glycol (MIRALAX / GLYCOLAX) packet Take 17 g by mouth daily as needed for mild constipation. 14 each 0   polyvinyl alcohol (LIQUIFILM TEARS) 1.4 % ophthalmic solution Place 1 drop into both eyes daily as needed for dry eyes. 15 mL 0   potassium chloride SA (K-DUR,KLOR-CON) 20 MEQ tablet Take 1 tablet (20 mEq total) by mouth daily. 7 tablet 0   PROAIR HFA 108 (90 Base) MCG/ACT inhaler Inhale 2 puffs into the lungs every 6 (six) hours as needed for wheezing or shortness of breath. 3 Inhaler 2   prochlorperazine (COMPAZINE) 10 MG tablet Take 1 tablet (10 mg total) by mouth every 6 (six) hours as needed for nausea or vomiting. 30 tablet 0   umeclidinium-vilanterol (ANORO ELLIPTA) 62.5-25 MCG/INH AEPB Inhale 1 puff into the lungs daily. (Patient not taking: Reported on 12/18/2018) 60 each 3   No current facility-administered medications for this visit.     SURGICAL HISTORY:  Past Surgical History:  Procedure Laterality Date   ABDOMINAL HYSTERECTOMY  12/1969   APPENDECTOMY  1957   CATARACT EXTRACTION      CHOLECYSTECTOMY  1997   IR IMAGING GUIDED PORT INSERTION  10/16/2018   LAPAROSCOPIC SIGMOID COLECTOMY  2013   secondary to diverticulitis   spit seed removal     TONSILLECTOMY  1945    REVIEW OF SYSTEMS:  Constitutional: positive for fatigue Eyes: negative Ears, nose, mouth, throat, and face: negative Respiratory: positive for cough and dyspnea on exertion Cardiovascular: negative Gastrointestinal: negative Genitourinary:negative Integument/breast: negative Hematologic/lymphatic: negative Musculoskeletal:positive for muscle weakness Neurological: negative Behavioral/Psych: negative Endocrine: negative Allergic/Immunologic: negative   PHYSICAL EXAMINATION: General appearance: alert, cooperative, fatigued and no distress Head: Normocephalic, without obvious abnormality, atraumatic Neck: no adenopathy, no JVD, supple, symmetrical, trachea midline and thyroid not enlarged, symmetric,  no tenderness/mass/nodules Lymph nodes: Cervical, supraclavicular, and axillary nodes normal. Resp: wheezes bilaterally Back: symmetric, no curvature. ROM normal. No CVA tenderness. Cardio: regular rate and rhythm, S1, S2 normal, no murmur, click, rub or gallop GI: soft, non-tender; bowel sounds normal; no masses,  no organomegaly Extremities: extremities normal, atraumatic, no cyanosis or edema Neurologic: Alert and oriented X 3, normal strength and tone. Normal symmetric reflexes. Normal coordination and gait  ECOG PERFORMANCE STATUS: 1 - Symptomatic but completely ambulatory  Blood pressure 138/88, pulse 61, temperature 98.4 F (36.9 C), temperature source Oral, resp. rate 18, height 5\' 2"  (1.575 m), SpO2 99 %.  LABORATORY DATA: Lab Results  Component Value Date   WBC 10.0 12/18/2018   HGB 9.2 (L) 12/18/2018   HCT 28.8 (L) 12/18/2018   MCV 95.7 12/18/2018   PLT 231 12/18/2018      Chemistry      Component Value Date/Time   NA 135 12/01/2018 0358   NA 136 05/14/2018 1225   NA 134  (L) 04/23/2012 0640   K 3.3 (L) 12/01/2018 0358   K 2.8 (L) 04/23/2012 0640   CL 94 (L) 12/01/2018 0358   CL 91 (L) 04/23/2012 0640   CO2 32 12/01/2018 0358   CO2 32 04/23/2012 0640   BUN 16 12/01/2018 0358   BUN 8 05/14/2018 1225   BUN 13 04/23/2012 0640   CREATININE 0.61 12/01/2018 0358   CREATININE 0.63 11/14/2018 1134   CREATININE 0.72 07/04/2017 1045      Component Value Date/Time   CALCIUM 7.9 (L) 12/01/2018 0358   CALCIUM 8.3 (L) 04/23/2012 0640   ALKPHOS 94 11/19/2018 1311   ALKPHOS 54 04/05/2012 0957   AST 20 11/19/2018 1311   AST 22 11/14/2018 1134   ALT 20 11/19/2018 1311   ALT 28 11/14/2018 1134   ALT 23 04/05/2012 0957   BILITOT 0.5 11/19/2018 1311   BILITOT 0.5 11/14/2018 1134       RADIOGRAPHIC STUDIES: Dg Chest 1 View  Result Date: 11/27/2018 CLINICAL DATA:  Hypoxia, history of hypertension EXAM: CHEST  1 VIEW COMPARISON:  11/25/2018 FINDINGS: There is bilateral diffuse mild interstitial thickening. There bilateral small pleural effusions, left greater than right. There is no pneumothorax. The heart mediastinum are stable. There is thoracic aortic atherosclerosis. There is a right-sided Port-A-Cath in satisfactory position. The osseous structures are unremarkable. IMPRESSION: 1. Findings concerning for mild pulmonary edema. Electronically Signed   By: Kathreen Devoid   On: 11/27/2018 11:04   Dg Chest 2 View  Result Date: 11/19/2018 CLINICAL DATA:  Increasing cough, congestion and shortness of breath since 11/15/2017. History of lung cancer. EXAM: CHEST - 2 VIEW COMPARISON:  PA and lateral chest 10/30/2018 and 09/12/2018. PET CT scan 09/20/2018. FINDINGS: The lungs are markedly emphysematous. Small left pleural effusion has slightly increased since the prior exam. There is some left basilar atelectasis. Left upper lobe pulmonary nodule seen on prior CT is not well demonstrated on this exam. No consolidative process pneumothorax. There is cardiomegaly. Aortic  atherosclerosis is noted. IMPRESSION: No change in a small left pleural effusion. Subsegmental atelectasis left lung base has slightly increased since the most recent exam. Emphysema. Known left upper lobe pulmonary nodule is not visible on this exam. Cardiomegaly. Atherosclerosis. Electronically Signed   By: Inge Rise M.D.   On: 11/19/2018 12:41   Ct Angio Chest Pe W And/or Wo Contrast  Result Date: 11/19/2018 CLINICAL DATA:  Shortness of breath, cough, lung cancer EXAM: CT ANGIOGRAPHY CHEST WITH CONTRAST  TECHNIQUE: Multidetector CT imaging of the chest was performed using the standard protocol during bolus administration of intravenous contrast. Multiplanar CT image reconstructions and MIPs were obtained to evaluate the vascular anatomy. CONTRAST:  161mL OMNIPAQUE IOHEXOL 350 MG/ML SOLN COMPARISON:  09/01/2018 FINDINGS: Cardiovascular: Satisfactory opacification of the pulmonary arteries to the segmental level. No evidence of pulmonary embolism. Normal heart size. Scattered coronary artery calcifications. Aortic atherosclerosis. No pericardial effusion. Right chest port catheter. Mediastinum/Nodes: Slight interval decrease in size of left hilar and subcarinal lymph nodes. There are new enlarged pretracheal and AP window lymph nodes (series 4, image 42). Thyroid gland, trachea, and esophagus demonstrate no significant findings. Lungs/Pleura: Small left pleural effusion with thickening and nodularity. Interval decrease in size of primary left upper lobe lung malignancy measuring 1.4 x 1.0 cm, previously 1.9 x 1.6 cm when measured similarly (series 4, image 52). There is extensive bilateral peripheral nodular and heterogeneous airspace opacity (series 4, image 67). Underlying emphysema Upper Abdomen: No acute abnormality. Musculoskeletal: No chest wall abnormality. No acute or significant osseous findings. Review of the MIP images confirms the above findings. IMPRESSION: 1.  Negative examination for  pulmonary embolism. 2. Redemonstrated stigmata of advanced left lung malignancy. Primary left upper lobe nodule appears decreased in size compared to prior examination with decreased bulk of left hilar and subcarinal lymphadenopathy. There is redemonstrated pleural thickening and nodularity with a reduced volume of pleural effusion. Findings are generally consistent with treatment response. 3. There is new, extensive bilateral peripheral nodular and heterogeneous airspace opacity and new enlarged paratracheal and AP window lymph nodes, this constellation of findings likely reflecting infection and/or edema. 4.  Underlying emphysema. Electronically Signed   By: Eddie Candle M.D.   On: 11/19/2018 15:31   Dg Chest Port 1 View  Result Date: 11/29/2018 CLINICAL DATA:  Leukocytosis and history of lung cancer EXAM: PORTABLE CHEST 1 VIEW COMPARISON:  11/27/2018 FINDINGS: Cardiac shadow is within normal limits. Aortic calcifications are again seen. Lungs are well aerated bilaterally. Small pleural effusions are again seen and stable. Increasing left retrocardiac density is noted likely representing atelectasis. No new focal infiltrate is seen. No bony abnormality is noted. IMPRESSION: Increase in left retrocardiac density. Stable effusions bilaterally. Electronically Signed   By: Inez Catalina M.D.   On: 11/29/2018 09:50   Dg Chest Port 1 View  Result Date: 11/25/2018 CLINICAL DATA:  Stage IV non-small-cell lung cancer. Shortness of breath. EXAM: PORTABLE CHEST 1 VIEW COMPARISON:  11/21/2018 FINDINGS: Right Port-A-Cath tip at low SVC. Midline trachea. Mild cardiomegaly. Atherosclerosis in the transverse aorta. Small bilateral pleural effusions are similar. No pneumothorax. Biapical pleural thickening. Similar interstitial thickening. Hyperinflation. Left greater than right base airspace disease is not significantly changed. Slight improvement in right upper lobe airspace disease. IMPRESSION: Improved right upper lobe  airspace disease, possibly decreased infection. Otherwise, similar appearance of the chest with mild pulmonary venous congestion, small bilateral pleural effusions, and bibasilar airspace disease. Aortic Atherosclerosis (ICD10-I70.0). Electronically Signed   By: Abigail Miyamoto M.D.   On: 11/25/2018 10:31   Dg Chest Port 1 View  Result Date: 11/21/2018 CLINICAL DATA:  Shortness of breath EXAM: PORTABLE CHEST 1 VIEW COMPARISON:  11/19/2018 FINDINGS: Borderline heart size, normal on recent CT. Stable mediastinal contours. Porta catheter on the right in good position. Interstitial coarsening that is diffuse and increased. There is hyperinflation and emphysema. History of lung cancer. IMPRESSION: 1. Increasing interstitial opacity which could be edema or atypical infection. 2. COPD. Electronically Signed   By:  Monte Fantasia M.D.   On: 11/21/2018 07:22    ASSESSMENT AND PLAN: This is a very pleasant 78 years old white female recently diagnosed with stage IV (T1b, N2, M1 a) non-small cell lung cancer highly suspicious for adenocarcinoma presented with left upper lobe lung nodule in addition to left hilar and mediastinal lymphadenopathy as well as malignant right pleural effusion and pleural based metastasis diagnosed in January 2020. The patient was started on systemic chemotherapy with carboplatin, Alimta and Avastin status post 2 cycles.   She has been tolerating this treatment well but she was recently admitted to the hospital with pneumonia.  Her treatment was delayed by several weeks. The patient is feeling much better today. She had repeat CT scan of the chest during her hospitalization that showed improvement of her disease especially the mediastinal lymphadenopathy. I recommended for the patient to resume her treatment with carboplatin, Alimta and Avastin but I will reduce the dose of carboplatin to AUC of 4 and Alimta to 100 mg/M2 starting from cycle #3. I will see the patient back for follow-up visit  in 3 weeks for evaluation before the next cycle of her treatment. For the insomnia and anxiety, the patient will arrange a follow-up appointment with her primary care physician for adjustment of her medication. She was advised to call immediately if she has any concerning symptoms in the interval. The patient voices understanding of current disease status and treatment options and is in agreement with the current care plan. All questions were answered. The patient knows to call the clinic with any problems, questions or concerns. We can certainly see the patient much sooner if necessary.  I spent 15 minutes counseling the patient face to face. The total time spent in the appointment was 25 minutes.  Disclaimer: This note was dictated with voice recognition software. Similar sounding words can inadvertently be transcribed and may not be corrected upon review.

## 2018-12-19 ENCOUNTER — Telehealth: Payer: Self-pay | Admitting: Internal Medicine

## 2018-12-19 NOTE — Telephone Encounter (Signed)
Tried to reach regarding schedule °

## 2018-12-20 ENCOUNTER — Other Ambulatory Visit: Payer: Medicare Other

## 2018-12-26 DIAGNOSIS — S30810A Abrasion of lower back and pelvis, initial encounter: Secondary | ICD-10-CM | POA: Diagnosis not present

## 2018-12-26 DIAGNOSIS — Z7409 Other reduced mobility: Secondary | ICD-10-CM | POA: Diagnosis not present

## 2018-12-26 DIAGNOSIS — M6281 Muscle weakness (generalized): Secondary | ICD-10-CM | POA: Diagnosis not present

## 2018-12-26 DIAGNOSIS — G894 Chronic pain syndrome: Secondary | ICD-10-CM | POA: Diagnosis not present

## 2018-12-26 DIAGNOSIS — J9621 Acute and chronic respiratory failure with hypoxia: Secondary | ICD-10-CM | POA: Diagnosis not present

## 2018-12-26 DIAGNOSIS — L89153 Pressure ulcer of sacral region, stage 3: Secondary | ICD-10-CM | POA: Diagnosis not present

## 2018-12-26 DIAGNOSIS — C3492 Malignant neoplasm of unspecified part of left bronchus or lung: Secondary | ICD-10-CM | POA: Diagnosis not present

## 2018-12-26 NOTE — Progress Notes (Unsigned)
{Choose 1 Note Type (Telehealth Visit or Telephone Visit):[phone]}   Evaluation Performed:  Follow-up visit  Date:  12/26/2018   ID:  Donna Mcneil, DOB May 06, 1941, MRN [id number]  {Patient [phone]::"Home"} {Provider Location:[phone]}  PCP:  [name], PA  Cardiologist:  Jenkins Rouge, MD  Electrophysiologist:  None   Chief Complaint:  Post hospitalization   History of Present Illness:    Donna Mcneil is a 78 y.o. female with with stage iV non small cell lung cancer getting chemoRx. She has been on oxygen 3L since January Admitted With URI cough low grade fever and sputum. Last dose chemo 10/09/18 Smoking up unitl diagnosis of cancer in January Pre Chemo echo 09/02/18 with EF 65-70% no valve disease Developed rapid atrial fib after admission has been Rx with iv cardizem but rates still high in 140 bpm range. She complains of smothering and dyspnea despite sats over 90% She is anxious. No chest pain or palpitations. CHADVASC score 3 for age and HTN  Pt on eliquis and flecainide with HR to 49 - added by Dr. Rayann Heman.  And metoprolol 100 mg BID  No longer on dig and dilt used in hospitalization  Troponin flat felt to be due to PAF, not ACS.  On home 02 on lasix for edema.  .   The patient {does/does not:200015} have symptoms concerning for COVID-19 infection (fever, chills, cough, or new shortness of breath).    Past Medical History:  Diagnosis Date  . COPD exacerbation ([place]) 09/01/2018  . COVID-19 virus infection 11/28/2018  . Diverticulitis   . Hypercholesteremia   . Hypertension   . Lupus ([place])   . Osteoporosis   . Tobacco use    Past Surgical History:  Procedure Laterality Date  . ABDOMINAL HYSTERECTOMY  12/1969  . APPENDECTOMY  1957  . CATARACT EXTRACTION    . CHOLECYSTECTOMY  1997  . IR IMAGING GUIDED PORT INSERTION  10/16/2018  . LAPAROSCOPIC SIGMOID COLECTOMY  2013   secondary to diverticulitis  . spit seed removal    . TONSILLECTOMY   1945     No outpatient medications have been marked as taking for the 12/27/18 encounter (Appointment) with Isaiah Serge, NP.     Allergies:   Codeine; Cyclosporine; Other; Pregabalin; and Zolpidem   Social History   Tobacco Use  . Smoking status: Former Smoker    Packs/day: 0.50    Types: Cigarettes    Last attempt to quit: 09/01/2018    Years since quitting: 0.3  . Smokeless tobacco: Never Used  . Tobacco comment: started smoking at age 89  Substance Use Topics  . Alcohol use: No    Alcohol/week: 0.0 standard drinks  . Drug use: No     Family Hx: The patient's family history includes Alcohol abuse in her brother and mother; Cancer in her son; Cirrhosis in her mother; Gout in her father; Heart disease in her brother and father; Hyperlipidemia in her father; Hypertension in her father; OCD in her son; Pneumonia in her paternal grandfather; Prostatitis in her paternal grandfather; Ulcers in her father.  ROS:   Please see the history of present illness.    *** All other systems reviewed and are negative.   Prior CV studies:   The following studies were reviewed today:  ***  Labs/Other Tests and Data Reviewed:    EKG:  {EKG/Telemetry Strips Reviewed:[phone]}  Recent Labs: 11/19/2018: B Natriuretic Peptide 540.4; TSH 0.524 11/22/2018: Magnesium 2.4 12/18/2018: ALT 20; BUN 12; Creatinine 0.80;  Hemoglobin 9.2; Platelet Count 231; Potassium 3.8; Sodium 136   Recent Lipid Panel Lab Results  Component Value Date/Time   CHOL 105 11/21/2018 05:57 AM   CHOL 124 05/14/2018 12:25 PM   TRIG 106 11/21/2018 05:57 AM   HDL 34 (L) 11/21/2018 05:57 AM   HDL 57 05/14/2018 12:25 PM   CHOLHDL 3.1 11/21/2018 05:57 AM   LDLCALC 50 11/21/2018 05:57 AM   LDLCALC 35 05/14/2018 12:25 PM   LDLCALC 72 07/04/2017 10:45 AM    Wt Readings from Last 3 Encounters:  12/01/18 128 lb 1.4 oz (58.1 kg)  11/01/18 129 lb 4.8 oz (58.7 kg)  10/17/18 137 lb 8 oz (62.4 kg)     Objective:     Vital Signs:  There were no vitals taken for this visit.   {[facility] Virtual Exam (Optional):[phone]::"VITAL SIGNS:  reviewed"}  ASSESSMENT & PLAN:    1. ***  COVID-19 Education: The signs and symptoms of COVID-19 were discussed with the patient and how to seek care for testing (follow up with PCP or arrange E-visit).  ***The importance of social distancing was discussed today.  Time:   Today, I have spent *** minutes with the patient with telehealth technology discussing the above problems.     Medication Adjustments/Labs and Tests Ordered: Current medicines are reviewed at length with the patient today.  Concerns regarding medicines are outlined above.   Tests Ordered: No orders of the defined types were placed in this encounter.   Medication Changes: No orders of the defined types were placed in this encounter.   Disposition:  Follow up {follow up:15908}  Signed, Cecilie Kicks, NP  12/26/2018 10:30 PM    [facility]

## 2018-12-27 ENCOUNTER — Ambulatory Visit: Payer: Medicare Other | Admitting: Cardiology

## 2018-12-27 ENCOUNTER — Inpatient Hospital Stay: Payer: Medicare Other

## 2018-12-27 ENCOUNTER — Other Ambulatory Visit: Payer: Self-pay

## 2018-12-27 DIAGNOSIS — Z5111 Encounter for antineoplastic chemotherapy: Secondary | ICD-10-CM | POA: Diagnosis not present

## 2018-12-27 DIAGNOSIS — I10 Essential (primary) hypertension: Secondary | ICD-10-CM | POA: Diagnosis not present

## 2018-12-27 DIAGNOSIS — M329 Systemic lupus erythematosus, unspecified: Secondary | ICD-10-CM | POA: Diagnosis not present

## 2018-12-27 DIAGNOSIS — M810 Age-related osteoporosis without current pathological fracture: Secondary | ICD-10-CM | POA: Diagnosis not present

## 2018-12-27 DIAGNOSIS — R0902 Hypoxemia: Secondary | ICD-10-CM | POA: Diagnosis not present

## 2018-12-27 DIAGNOSIS — R5383 Other fatigue: Secondary | ICD-10-CM | POA: Diagnosis not present

## 2018-12-27 DIAGNOSIS — C3412 Malignant neoplasm of upper lobe, left bronchus or lung: Secondary | ICD-10-CM | POA: Diagnosis not present

## 2018-12-27 DIAGNOSIS — Z79899 Other long term (current) drug therapy: Secondary | ICD-10-CM | POA: Diagnosis not present

## 2018-12-27 DIAGNOSIS — J449 Chronic obstructive pulmonary disease, unspecified: Secondary | ICD-10-CM | POA: Diagnosis not present

## 2018-12-27 DIAGNOSIS — J90 Pleural effusion, not elsewhere classified: Secondary | ICD-10-CM | POA: Diagnosis not present

## 2018-12-27 DIAGNOSIS — M069 Rheumatoid arthritis, unspecified: Secondary | ICD-10-CM | POA: Diagnosis not present

## 2018-12-27 DIAGNOSIS — E7800 Pure hypercholesterolemia, unspecified: Secondary | ICD-10-CM | POA: Diagnosis not present

## 2018-12-27 DIAGNOSIS — Z95828 Presence of other vascular implants and grafts: Secondary | ICD-10-CM

## 2018-12-27 DIAGNOSIS — C3492 Malignant neoplasm of unspecified part of left bronchus or lung: Secondary | ICD-10-CM

## 2018-12-27 DIAGNOSIS — I700 Atherosclerosis of aorta: Secondary | ICD-10-CM | POA: Diagnosis not present

## 2018-12-27 LAB — CBC WITH DIFFERENTIAL (CANCER CENTER ONLY)
Abs Immature Granulocytes: 0.06 10*3/uL (ref 0.00–0.07)
Basophils Absolute: 0 10*3/uL (ref 0.0–0.1)
Basophils Relative: 1 %
Eosinophils Absolute: 0 10*3/uL (ref 0.0–0.5)
Eosinophils Relative: 1 %
HCT: 27.6 % — ABNORMAL LOW (ref 36.0–46.0)
Hemoglobin: 9 g/dL — ABNORMAL LOW (ref 12.0–15.0)
Immature Granulocytes: 1 %
Lymphocytes Relative: 19 %
Lymphs Abs: 1.3 10*3/uL (ref 0.7–4.0)
MCH: 30.5 pg (ref 26.0–34.0)
MCHC: 32.6 g/dL (ref 30.0–36.0)
MCV: 93.6 fL (ref 80.0–100.0)
Monocytes Absolute: 0.7 10*3/uL (ref 0.1–1.0)
Monocytes Relative: 10 %
Neutro Abs: 4.6 10*3/uL (ref 1.7–7.7)
Neutrophils Relative %: 68 %
Platelet Count: 70 10*3/uL — ABNORMAL LOW (ref 150–400)
RBC: 2.95 MIL/uL — ABNORMAL LOW (ref 3.87–5.11)
RDW: 17.4 % — ABNORMAL HIGH (ref 11.5–15.5)
WBC Count: 6.7 10*3/uL (ref 4.0–10.5)
nRBC: 0 % (ref 0.0–0.2)

## 2018-12-27 LAB — CMP (CANCER CENTER ONLY)
ALT: 17 U/L (ref 0–44)
AST: 28 U/L (ref 15–41)
Albumin: 2 g/dL — ABNORMAL LOW (ref 3.5–5.0)
Alkaline Phosphatase: 88 U/L (ref 38–126)
Anion gap: 11 (ref 5–15)
BUN: 7 mg/dL — ABNORMAL LOW (ref 8–23)
CO2: 28 mmol/L (ref 22–32)
Calcium: 8.4 mg/dL — ABNORMAL LOW (ref 8.9–10.3)
Chloride: 94 mmol/L — ABNORMAL LOW (ref 98–111)
Creatinine: 0.59 mg/dL (ref 0.44–1.00)
GFR, Est AFR Am: >60 mL/min (ref >60)
GFR, Estimated: >60 mL/min (ref >60)
Glucose, Bld: 75 mg/dL (ref 70–99)
Potassium: 4.4 mmol/L (ref 3.5–5.1)
Sodium: 133 mmol/L — ABNORMAL LOW (ref 135–145)
Total Bilirubin: 0.5 mg/dL (ref 0.3–1.2)
Total Protein: 6.3 g/dL — ABNORMAL LOW (ref 6.5–8.1)

## 2018-12-27 MED ORDER — SODIUM CHLORIDE 0.9% FLUSH
10.0000 mL | INTRAVENOUS | Status: DC | PRN
  Administered 2018-12-27: 11:00:00 10 mL
  Filled 2018-12-27: qty 10

## 2018-12-27 MED ORDER — HEPARIN SOD (PORK) LOCK FLUSH 100 UNIT/ML IV SOLN
500.0000 [IU] | Freq: Once | INTRAVENOUS | Status: AC | PRN
  Administered 2018-12-27: 11:00:00 500 [IU]
  Filled 2018-12-27: qty 5

## 2018-12-28 ENCOUNTER — Telehealth: Payer: Self-pay

## 2018-12-28 NOTE — Telephone Encounter (Signed)
Called to schedule patient appointment for a rehab/ hospital follow-up. Patient voicemail was to full to leave a message and will try patient back another time.

## 2019-01-01 ENCOUNTER — Inpatient Hospital Stay: Payer: Medicare Other

## 2019-01-01 ENCOUNTER — Other Ambulatory Visit: Payer: Self-pay

## 2019-01-01 ENCOUNTER — Inpatient Hospital Stay: Payer: Medicare Other | Attending: Internal Medicine

## 2019-01-01 ENCOUNTER — Telehealth: Payer: Self-pay | Admitting: Medical Oncology

## 2019-01-01 DIAGNOSIS — M810 Age-related osteoporosis without current pathological fracture: Secondary | ICD-10-CM | POA: Insufficient documentation

## 2019-01-01 DIAGNOSIS — J449 Chronic obstructive pulmonary disease, unspecified: Secondary | ICD-10-CM | POA: Insufficient documentation

## 2019-01-01 DIAGNOSIS — C3492 Malignant neoplasm of unspecified part of left bronchus or lung: Secondary | ICD-10-CM

## 2019-01-01 DIAGNOSIS — Z8719 Personal history of other diseases of the digestive system: Secondary | ICD-10-CM | POA: Insufficient documentation

## 2019-01-01 DIAGNOSIS — Z79899 Other long term (current) drug therapy: Secondary | ICD-10-CM | POA: Insufficient documentation

## 2019-01-01 DIAGNOSIS — C782 Secondary malignant neoplasm of pleura: Secondary | ICD-10-CM | POA: Insufficient documentation

## 2019-01-01 DIAGNOSIS — Z5111 Encounter for antineoplastic chemotherapy: Secondary | ICD-10-CM | POA: Insufficient documentation

## 2019-01-01 DIAGNOSIS — I10 Essential (primary) hypertension: Secondary | ICD-10-CM | POA: Insufficient documentation

## 2019-01-01 DIAGNOSIS — M069 Rheumatoid arthritis, unspecified: Secondary | ICD-10-CM | POA: Insufficient documentation

## 2019-01-01 DIAGNOSIS — R0602 Shortness of breath: Secondary | ICD-10-CM | POA: Insufficient documentation

## 2019-01-01 DIAGNOSIS — Z95828 Presence of other vascular implants and grafts: Secondary | ICD-10-CM

## 2019-01-01 DIAGNOSIS — M329 Systemic lupus erythematosus, unspecified: Secondary | ICD-10-CM | POA: Diagnosis not present

## 2019-01-01 DIAGNOSIS — J910 Malignant pleural effusion: Secondary | ICD-10-CM | POA: Insufficient documentation

## 2019-01-01 DIAGNOSIS — C778 Secondary and unspecified malignant neoplasm of lymph nodes of multiple regions: Secondary | ICD-10-CM | POA: Diagnosis not present

## 2019-01-01 DIAGNOSIS — Z8619 Personal history of other infectious and parasitic diseases: Secondary | ICD-10-CM | POA: Insufficient documentation

## 2019-01-01 DIAGNOSIS — Z7901 Long term (current) use of anticoagulants: Secondary | ICD-10-CM | POA: Diagnosis not present

## 2019-01-01 DIAGNOSIS — F17210 Nicotine dependence, cigarettes, uncomplicated: Secondary | ICD-10-CM | POA: Insufficient documentation

## 2019-01-01 DIAGNOSIS — Z5112 Encounter for antineoplastic immunotherapy: Secondary | ICD-10-CM | POA: Diagnosis not present

## 2019-01-01 DIAGNOSIS — C3412 Malignant neoplasm of upper lobe, left bronchus or lung: Secondary | ICD-10-CM | POA: Insufficient documentation

## 2019-01-01 LAB — CMP (CANCER CENTER ONLY)
ALT: 20 U/L (ref 0–44)
AST: 32 U/L (ref 15–41)
Albumin: 1.8 g/dL — ABNORMAL LOW (ref 3.5–5.0)
Alkaline Phosphatase: 98 U/L (ref 38–126)
Anion gap: 8 (ref 5–15)
BUN: 6 mg/dL — ABNORMAL LOW (ref 8–23)
CO2: 28 mmol/L (ref 22–32)
Calcium: 7.8 mg/dL — ABNORMAL LOW (ref 8.9–10.3)
Chloride: 93 mmol/L — ABNORMAL LOW (ref 98–111)
Creatinine: 0.57 mg/dL (ref 0.44–1.00)
GFR, Est AFR Am: >60 mL/min (ref >60)
GFR, Estimated: >60 mL/min (ref >60)
Glucose, Bld: 93 mg/dL (ref 70–99)
Potassium: 3.8 mmol/L (ref 3.5–5.1)
Sodium: 129 mmol/L — ABNORMAL LOW (ref 135–145)
Total Bilirubin: 0.3 mg/dL (ref 0.3–1.2)
Total Protein: 6.3 g/dL — ABNORMAL LOW (ref 6.5–8.1)

## 2019-01-01 LAB — CBC WITH DIFFERENTIAL (CANCER CENTER ONLY)
Abs Immature Granulocytes: 0.04 10*3/uL (ref 0.00–0.07)
Basophils Absolute: 0 10*3/uL (ref 0.0–0.1)
Basophils Relative: 0 %
Eosinophils Absolute: 0 10*3/uL (ref 0.0–0.5)
Eosinophils Relative: 0 %
HCT: 26.9 % — ABNORMAL LOW (ref 36.0–46.0)
Hemoglobin: 8.7 g/dL — ABNORMAL LOW (ref 12.0–15.0)
Immature Granulocytes: 1 %
Lymphocytes Relative: 25 %
Lymphs Abs: 1.5 10*3/uL (ref 0.7–4.0)
MCH: 30.4 pg (ref 26.0–34.0)
MCHC: 32.3 g/dL (ref 30.0–36.0)
MCV: 94.1 fL (ref 80.0–100.0)
Monocytes Absolute: 0.7 10*3/uL (ref 0.1–1.0)
Monocytes Relative: 12 %
Neutro Abs: 3.7 10*3/uL (ref 1.7–7.7)
Neutrophils Relative %: 62 %
Platelet Count: 101 10*3/uL — ABNORMAL LOW (ref 150–400)
RBC: 2.86 MIL/uL — ABNORMAL LOW (ref 3.87–5.11)
RDW: 18.4 % — ABNORMAL HIGH (ref 11.5–15.5)
WBC Count: 6 10*3/uL (ref 4.0–10.5)
nRBC: 0 % (ref 0.0–0.2)

## 2019-01-01 MED ORDER — HEPARIN SOD (PORK) LOCK FLUSH 100 UNIT/ML IV SOLN
500.0000 [IU] | Freq: Once | INTRAVENOUS | Status: AC | PRN
  Administered 2019-01-01: 500 [IU]
  Filled 2019-01-01: qty 5

## 2019-01-01 MED ORDER — SODIUM CHLORIDE 0.9% FLUSH
10.0000 mL | INTRAVENOUS | Status: DC | PRN
  Administered 2019-01-01: 10 mL
  Filled 2019-01-01: qty 10

## 2019-01-01 NOTE — Patient Instructions (Signed)

## 2019-01-01 NOTE — Telephone Encounter (Signed)
Pt out of rehab and was referred by inp for home health. Asking  which provider to call for orders . I told her to contact PCP.

## 2019-01-02 ENCOUNTER — Ambulatory Visit (INDEPENDENT_AMBULATORY_CARE_PROVIDER_SITE_OTHER): Payer: Medicare Other | Admitting: Primary Care

## 2019-01-02 ENCOUNTER — Ambulatory Visit: Payer: Medicare Other | Admitting: Pulmonary Disease

## 2019-01-02 ENCOUNTER — Encounter: Payer: Self-pay | Admitting: Primary Care

## 2019-01-02 VITALS — BP 126/72 | HR 67 | Temp 97.8°F

## 2019-01-02 DIAGNOSIS — J910 Malignant pleural effusion: Secondary | ICD-10-CM

## 2019-01-02 DIAGNOSIS — J9611 Chronic respiratory failure with hypoxia: Secondary | ICD-10-CM

## 2019-01-02 DIAGNOSIS — I4891 Unspecified atrial fibrillation: Secondary | ICD-10-CM

## 2019-01-02 DIAGNOSIS — C3492 Malignant neoplasm of unspecified part of left bronchus or lung: Secondary | ICD-10-CM | POA: Diagnosis not present

## 2019-01-02 MED ORDER — UMECLIDINIUM-VILANTEROL 62.5-25 MCG/INH IN AEPB: 1.0000 | INHALATION_SPRAY | Freq: Every day | RESPIRATORY_TRACT | 0 refills | Status: AC

## 2019-01-02 NOTE — Assessment & Plan Note (Signed)
-   Regularly irregular; HR 67 - Continues Fecainide, lopressor and Eliquis - Fu with Dr. Johnsie Cancel

## 2019-01-02 NOTE — Assessment & Plan Note (Signed)
-   CTA  showed advanced left lung malignancy, interval decrease in size of primary left upper lobe lung malignancy measuring  - Following with Dr. Earlie Server, receiving chemotherapy

## 2019-01-02 NOTE — Progress Notes (Signed)
@Patient  ID: Donna Mcneil, female    DOB: 21-May-1941, 78 y.o.   MRN: [id number]  Chief Complaint  Patient presents with   Follow-up    SOB with exertion cough occasional lt yellow    Referring provider: [name], PA  HPI: 78 year old female, former smoker quit Jan 2020. PMH significant for RA (on methotrexate), lupus, COPD, malignant pleural effusion, stage IV non-smll cell lung cancer diagnosed in January 2020. Following with Dr. Julien Nordmann. On systemic chemotherapy (carboplatin for AUC of 5, Alimta 500 mg/M2 and Avastin 15 mg/KG) every 3 weeks.  Recent hospital admission from 11/19/18-12/01/18 for shortness of breath, cough and fever. CTA negative for PE, redemonstrated stigmata of advanced left lung malignancy. Primary left upper lobe nodule appears decreased in size .There is new extensive bilateral peripheral nodular and heterogeneous airspace opacity and new enlarged paratracheal lymph nodules consistent with infection versus edema. CXR showed mild pulmonary edema. Viral penal negative. COVID 19 negative. Patient treated for COPD exacerbation with full course of antibiotics, steroid taper and bronchodilators. CHF treated with IV lasix with good urine output. Complicated by RVR, cards consulted and placed on flecainide, lopressor and Eliquis.  Pallative care consulted for goals. Needs outpatient follow-up with Cardiology/Dr. Johnsie Cancel and oncology.  01/02/2019 Patient called today for 2-3 month follow-up visit. Accompanied by female care giver with patient today.   Discharged home from assisted living yesterday. Doing well, care taker will me out of town for 3-4 days. Plans to received PT, PT nursing, casework and NA. Breathing is baseline, using Anoro 1 puff daily. She was receiving scheduled neb treatment while in rehab. States that she needs humidification for her oxygen. Occasional cough. Eating and drinking ok. In [facility], has bedside commode at home. Continues to follow with Dr.  Delorse Limber, last chemo treatment 2 days ago.   Allergies  Allergen Reactions   Codeine Other (See Comments)    GI upset   Cyclosporine Other (See Comments)    Burning eyes   Other     Other reaction(s): OTHER   Pregabalin Other (See Comments)    Personality changes-becomes angry   Zolpidem     Other reaction(s): ITCHING    Immunization History  Administered Date(s) Administered   Influenza Split 08/15/2014   Influenza, High Dose Seasonal PF 07/04/2017, 05/14/2018   Influenza, Seasonal, Injecte, Preservative Fre 07/12/2010, 07/04/2011   Influenza-Unspecified 07/17/2015, 07/02/2016   Pneumococcal Conjugate-13 07/04/2017   Pneumococcal Polysaccharide-23 07/12/2010, 07/17/2015    Past Medical History:  Diagnosis Date   COPD exacerbation ([place]) 09/01/2018   COVID-19 virus infection 11/28/2018   Diverticulitis    Hypercholesteremia    Hypertension    Lupus ([place])    Osteoporosis    Tobacco use     Tobacco History: Social History   Tobacco Use  Smoking Status Former Smoker   Packs/day: 0.50   Types: Cigarettes   Last attempt to quit: 09/01/2018   Years since quitting: 0.3  Smokeless Tobacco Never Used  Tobacco Comment   started smoking at age 1   Counseling given: Not Answered Comment: started smoking at age 52   Outpatient Medications Prior to Visit  Medication Sig Dispense Refill   acetaminophen (TYLENOL) 500 MG tablet Take 1,000 mg by mouth every 6 (six) hours as needed for mild pain.      albuterol (PROVENTIL) (2.5 MG/3ML) 0.083% nebulizer solution Take 3 mLs (2.5 mg total) by nebulization every 4 (four) hours as needed for wheezing or shortness of breath. 180 mL  2   ALPRAZolam (XANAX) 1 MG tablet Take 1 tablet (1 mg total) by mouth 2 (two) times daily as needed for anxiety. 20 tablet 0   apixaban (ELIQUIS) 2.5 MG TABS tablet Take 1 tablet (2.5 mg total) by mouth 2 (two) times daily. 60 tablet 0   atorvastatin (LIPITOR) 40 MG tablet TAKE 1  TABLET BY MOUTH ONCE DAILY (Patient taking differently: Take 40 mg by mouth every evening. ) 90 tablet 3   b complex vitamins tablet Take 2 tablets by mouth 2 (two) times daily.     bisacodyl (DULCOLAX) 10 MG suppository Place 10 mg rectally as needed for moderate constipation.     Calcium Carbonate-Vitamin D (CALCIUM 600+D) 600-200 MG-UNIT TABS Take 1 tablet by mouth 2 (two) times daily.      carboxymethylcellulose (REFRESH TEARS) 0.5 % SOLN Place 1 drop into both eyes daily as needed (dry eyes).      Coenzyme Q10 (COQ10) 100 MG CAPS Take 100 mg by mouth daily.     docusate sodium (COLACE) 100 MG capsule Take 100 mg by mouth 2 (two) times daily.     feeding supplement, ENSURE ENLIVE, (ENSURE ENLIVE) LIQD Take 237 mLs by mouth 2 (two) times daily between meals. 237 mL 12   flecainide (TAMBOCOR) 50 MG tablet Take 1 tablet (50 mg total) by mouth every 12 (twelve) hours. 60 tablet 0   fluticasone (FLONASE) 50 MCG/ACT nasal spray Place 2 sprays into both nostrils daily. 16 g 2   folic acid (FOLVITE) 1 MG tablet Take 1 tablet (1 mg total) by mouth daily. 30 tablet 4   furosemide (LASIX) 20 MG tablet Take 1 tablet (20 mg total) by mouth 2 (two) times daily. 60 tablet 0   gabapentin (NEURONTIN) 100 MG capsule Take 100 mg by mouth 3 (three) times daily.      guaiFENesin (ROBITUSSIN) 100 MG/5ML liquid Take 200 mg by mouth 3 (three) times daily as needed for cough.     HYDROcodone-Acetaminophen (VICODIN) 5-300 MG TABS Take 1 tablet by mouth every 6 (six) hours as needed (severe pain).     hydroxychloroquine (PLAQUENIL) 200 MG tablet Take 1 tablet by mouth daily.     ipratropium (ATROVENT HFA) 17 MCG/ACT inhaler Inhale 2 puffs into the lungs 3 (three) times daily. 1 Inhaler 12   Krill Oil 500 MG CAPS Take 500 mg by mouth daily.     lidocaine-prilocaine (EMLA) cream Apply 1 application topically as needed. 30 g 0   loperamide (LOPERAMIDE A-D) 2 MG tablet Take 2 mg by mouth daily as needed  for diarrhea or loose stools.     loratadine (CLARITIN) 10 MG tablet Take 1 tablet (10 mg total) by mouth daily.     Melatonin 3 MG TABS Take 3 mg by mouth at bedtime.     methotrexate (RHEUMATREX) 2.5 MG tablet Take 6 tablets (15 mg total) by mouth every Monday. Hold it for one week and talk to your PCP before resuming it. 4 tablet 0   metoprolol tartrate (LOPRESSOR) 100 MG tablet Take 1 tablet (100 mg total) by mouth 2 (two) times daily. 60 tablet 0   Multiple Vitamins-Minerals (MULTIVITAMIN ADULT PO) Take 1 tablet by mouth daily.      Omega 3 1000 MG CAPS Take 1,000-2,000 mg by mouth See admin instructions. Take 2000 mg in the morning and 1000 mg at night     pantoprazole (PROTONIX) 40 MG tablet Take 1 tablet (40 mg total) by mouth daily at  6 (six) AM. 30 tablet 0   polyethylene glycol (MIRALAX / GLYCOLAX) packet Take 17 g by mouth daily as needed for mild constipation. 14 each 0   polyvinyl alcohol (LIQUIFILM TEARS) 1.4 % ophthalmic solution Place 1 drop into both eyes daily as needed for dry eyes. 15 mL 0   potassium chloride SA (K-DUR,KLOR-CON) 20 MEQ tablet Take 1 tablet (20 mEq total) by mouth daily. 7 tablet 0   PROAIR HFA 108 (90 Base) MCG/ACT inhaler Inhale 2 puffs into the lungs every 6 (six) hours as needed for wheezing or shortness of breath. 3 Inhaler 2   Probiotic Product (PROBIOTIC DAILY) CAPS Take 1 capsule by mouth 2 (two) times daily.     prochlorperazine (COMPAZINE) 10 MG tablet Take 1 tablet (10 mg total) by mouth every 6 (six) hours as needed for nausea or vomiting. 30 tablet 0   rivaroxaban (XARELTO) 10 MG TABS tablet Take 10 mg by mouth daily. For A-fib     umeclidinium-vilanterol (ANORO ELLIPTA) 62.5-25 MCG/INH AEPB Inhale 1 puff into the lungs daily. 60 each 3   No facility-administered medications prior to visit.     Review of Systems  Review of Systems  Constitutional: Negative.   Respiratory: Positive for cough. Negative for shortness of breath and  wheezing.   Cardiovascular: Negative.    Physical Exam  BP 126/72 (BP Location: Left Arm, Cuff Size: Normal)    Pulse 67    Temp 97.8 F (36.6 C)    SpO2 100%  Physical Exam HENT:     Head: Normocephalic and atraumatic.     Mouth/Throat:     Mouth: Mucous membranes are moist.     Pharynx: Oropharynx is clear.  Cardiovascular:     Rate and Rhythm: Regular rhythm.     Comments: Trace BLE edema Pulmonary:     Breath sounds: No wheezing or rhonchi.     Comments: CTA, diminished  Musculoskeletal:     Comments: In WC  Skin:    General: Skin is warm and dry.  Neurological:     General: No focal deficit present.     Mental Status: She is alert and oriented to person, place, and time. Mental status is at baseline.  Psychiatric:        Mood and Affect: Mood normal.        Behavior: Behavior normal.        Thought Content: Thought content normal.        Judgment: Judgment normal.      Lab Results:  CBC    Component Value Date/Time   WBC 6.0 01/01/2019 1131   WBC 18.1 (H) 11/30/2018 0406   RBC 2.86 (L) 01/01/2019 1131   HGB 8.7 (L) 01/01/2019 1131   HGB 15.1 05/14/2018 1225   HCT 26.9 (L) 01/01/2019 1131   HCT 41.0 05/14/2018 1225   PLT 101 (L) 01/01/2019 1131   PLT 224 05/14/2018 1225   MCV 94.1 01/01/2019 1131   MCV 91 05/14/2018 1225   MCV 93 04/23/2012 0640   MCH 30.4 01/01/2019 1131   MCHC 32.3 01/01/2019 1131   RDW 18.4 (H) 01/01/2019 1131   RDW 13.8 05/14/2018 1225   RDW 14.6 (H) 04/23/2012 0640   LYMPHSABS 1.5 01/01/2019 1131   LYMPHSABS 1.9 05/14/2018 1225   LYMPHSABS 0.5 (L) 04/23/2012 0640   MONOABS 0.7 01/01/2019 1131   MONOABS 0.7 04/23/2012 0640   EOSABS 0.0 01/01/2019 1131   EOSABS 0.1 05/14/2018 1225   EOSABS 0.0 04/23/2012  0640   BASOSABS 0.0 01/01/2019 1131   BASOSABS 0.1 05/14/2018 1225   BASOSABS 0.0 04/23/2012 0640    BMET    Component Value Date/Time   NA 129 (L) 01/01/2019 1131   NA 136 05/14/2018 1225   NA 134 (L) 04/23/2012 0640     K 3.8 01/01/2019 1131   K 2.8 (L) 04/23/2012 0640   CL 93 (L) 01/01/2019 1131   CL 91 (L) 04/23/2012 0640   CO2 28 01/01/2019 1131   CO2 32 04/23/2012 0640   GLUCOSE 93 01/01/2019 1131   GLUCOSE 117 (H) 04/23/2012 0640   BUN 6 (L) 01/01/2019 1131   BUN 8 05/14/2018 1225   BUN 13 04/23/2012 0640   CREATININE 0.57 01/01/2019 1131   CREATININE 0.72 07/04/2017 1045   CALCIUM 7.8 (L) 01/01/2019 1131   CALCIUM 8.3 (L) 04/23/2012 0640   GFRNONAA >60 01/01/2019 1131   GFRNONAA 81 07/04/2017 1045   GFRAA >60 01/01/2019 1131   GFRAA 94 07/04/2017 1045    BNP    Component Value Date/Time   BNP 540.4 (H) 11/19/2018 1312    ProBNP No results found for: PROBNP  Imaging: No results found.   Assessment & Plan:   COPD (chronic obstructive pulmonary disease) (HCC) - Continue Anoro 1 puff daily (sample given) - Use Albuterol nebulizer 2-3 times a day for shortness of breath/wheezing  - Continue mucinex twice daily - Use incentive spirometer   Chronic respiratory failure with hypoxia (HCC) - Stable; O2 100% 4L  - Use 2 to 4 L of oxygen continuously to keep O2 level > 88-90% - Need humidification for oxygen   Atrial fibrillation with RVR (HCC) - Regularly irregular; HR 67 - Continues Fecainide, lopressor and Eliquis - Fu with Dr. Johnsie Cancel    Malignant pleural effusion CXR 4/2 showed small stable pleural effusions   Adenocarcinoma of left lung, stage 4 (HCC) - CTA  showed advanced left lung malignancy, interval decrease in size of primary left upper lobe lung malignancy measuring  - Following with Dr. Earlie Server, receiving chemotherapy    FU in 2-4 months with Dr. Lake Bells or sooner if needed  Martyn Ehrich, NP 01/02/2019

## 2019-01-02 NOTE — Assessment & Plan Note (Addendum)
-   Continue Anoro 1 puff daily (sample given) - Use Albuterol nebulizer 2-3 times a day for shortness of breath/wheezing  - Continue mucinex twice daily - Use incentive spirometer

## 2019-01-02 NOTE — Assessment & Plan Note (Addendum)
-   Stable; O2 100% 4L  - Use 2 to 4 L of oxygen continuously to keep O2 level > 88-90% - Need humidification for oxygen

## 2019-01-02 NOTE — Patient Instructions (Addendum)
COPD: Continue Anoro 1 puff daily (sample given) Use Albuterol nebulizer 2-3 times a day for shortness of breath/wheezing   Recommendations: Continue mucinex twice daily Use incentive spirometer at home if you still have it Stay active, get out of bed to chair daily  Focus on staying well hydrated and eating good sources of protein  Chronic respiratory failure with hypoxemia: - Use 2 to 4 L of oxygen continuously to keep O2 level > 88-90%  Will see about getting you a humidifier for your oxygen  Follow up with Dr. Lake Bells in 2-4 months or sooner if needed

## 2019-01-02 NOTE — Assessment & Plan Note (Signed)
CXR 4/2 showed small stable pleural effusions

## 2019-01-03 ENCOUNTER — Other Ambulatory Visit: Payer: Medicare Other

## 2019-01-03 ENCOUNTER — Telehealth: Payer: Self-pay

## 2019-01-03 ENCOUNTER — Ambulatory Visit: Payer: Medicare Other | Admitting: Internal Medicine

## 2019-01-03 ENCOUNTER — Ambulatory Visit: Payer: Medicare Other

## 2019-01-03 DIAGNOSIS — M329 Systemic lupus erythematosus, unspecified: Secondary | ICD-10-CM | POA: Diagnosis not present

## 2019-01-03 DIAGNOSIS — Z7952 Long term (current) use of systemic steroids: Secondary | ICD-10-CM | POA: Diagnosis not present

## 2019-01-03 DIAGNOSIS — J910 Malignant pleural effusion: Secondary | ICD-10-CM | POA: Diagnosis not present

## 2019-01-03 DIAGNOSIS — Z7982 Long term (current) use of aspirin: Secondary | ICD-10-CM | POA: Diagnosis not present

## 2019-01-03 DIAGNOSIS — I480 Paroxysmal atrial fibrillation: Secondary | ICD-10-CM | POA: Diagnosis not present

## 2019-01-03 DIAGNOSIS — G9009 Other idiopathic peripheral autonomic neuropathy: Secondary | ICD-10-CM | POA: Diagnosis not present

## 2019-01-03 DIAGNOSIS — J441 Chronic obstructive pulmonary disease with (acute) exacerbation: Secondary | ICD-10-CM | POA: Diagnosis not present

## 2019-01-03 DIAGNOSIS — Z87891 Personal history of nicotine dependence: Secondary | ICD-10-CM | POA: Diagnosis not present

## 2019-01-03 DIAGNOSIS — E782 Mixed hyperlipidemia: Secondary | ICD-10-CM | POA: Diagnosis not present

## 2019-01-03 DIAGNOSIS — K219 Gastro-esophageal reflux disease without esophagitis: Secondary | ICD-10-CM | POA: Diagnosis not present

## 2019-01-03 DIAGNOSIS — I110 Hypertensive heart disease with heart failure: Secondary | ICD-10-CM | POA: Diagnosis not present

## 2019-01-03 DIAGNOSIS — C3492 Malignant neoplasm of unspecified part of left bronchus or lung: Secondary | ICD-10-CM | POA: Diagnosis not present

## 2019-01-03 DIAGNOSIS — J9611 Chronic respiratory failure with hypoxia: Secondary | ICD-10-CM | POA: Diagnosis not present

## 2019-01-03 DIAGNOSIS — D72829 Elevated white blood cell count, unspecified: Secondary | ICD-10-CM | POA: Diagnosis not present

## 2019-01-03 DIAGNOSIS — J9621 Acute and chronic respiratory failure with hypoxia: Secondary | ICD-10-CM | POA: Diagnosis not present

## 2019-01-03 DIAGNOSIS — I5033 Acute on chronic diastolic (congestive) heart failure: Secondary | ICD-10-CM | POA: Diagnosis not present

## 2019-01-03 DIAGNOSIS — Z7901 Long term (current) use of anticoagulants: Secondary | ICD-10-CM | POA: Diagnosis not present

## 2019-01-03 DIAGNOSIS — Z9981 Dependence on supplemental oxygen: Secondary | ICD-10-CM | POA: Diagnosis not present

## 2019-01-03 DIAGNOSIS — J449 Chronic obstructive pulmonary disease, unspecified: Secondary | ICD-10-CM | POA: Diagnosis not present

## 2019-01-03 DIAGNOSIS — G894 Chronic pain syndrome: Secondary | ICD-10-CM | POA: Diagnosis not present

## 2019-01-03 DIAGNOSIS — M0579 Rheumatoid arthritis with rheumatoid factor of multiple sites without organ or systems involvement: Secondary | ICD-10-CM | POA: Diagnosis not present

## 2019-01-03 DIAGNOSIS — Z9181 History of falling: Secondary | ICD-10-CM | POA: Diagnosis not present

## 2019-01-03 DIAGNOSIS — M1990 Unspecified osteoarthritis, unspecified site: Secondary | ICD-10-CM | POA: Diagnosis not present

## 2019-01-03 NOTE — Telephone Encounter (Signed)
Appointment was scheduled with Simona Huh for 01/15/2019 at 2:20pm by Porsha.

## 2019-01-03 NOTE — Telephone Encounter (Signed)
No answer. Left message to call back.   

## 2019-01-03 NOTE — Telephone Encounter (Signed)
Diane from [facility].  Needs verbal orders for Nursing visits 2 x per wk x 2 wks,  then 1 per wk x 6 wks. Diane is going to fax medication list as she sees several discrepancies on the list and needs to reconcile her medications.  She has moved the patient's visit up to Monday and would like to talk with you about what type of electronic visit she can do.  The patient does not have access but Diane said she has been using her phone or tablet with most of the patients to get visits done.  I did not know if there would be any type of HIPPA issue so I wanted you to know first.  She is currently scheduled for Monday but I have not sent any email or other invite until you and Diane decide what can be done. Thanks  Diane CB # [phone]

## 2019-01-03 NOTE — Progress Notes (Signed)
Reviewed, agree 

## 2019-01-04 ENCOUNTER — Telehealth: Payer: Self-pay | Admitting: *Deleted

## 2019-01-04 DIAGNOSIS — Z7901 Long term (current) use of anticoagulants: Secondary | ICD-10-CM | POA: Diagnosis not present

## 2019-01-04 DIAGNOSIS — J9621 Acute and chronic respiratory failure with hypoxia: Secondary | ICD-10-CM | POA: Diagnosis not present

## 2019-01-04 DIAGNOSIS — C3492 Malignant neoplasm of unspecified part of left bronchus or lung: Secondary | ICD-10-CM | POA: Diagnosis not present

## 2019-01-04 DIAGNOSIS — Z9181 History of falling: Secondary | ICD-10-CM | POA: Diagnosis not present

## 2019-01-04 DIAGNOSIS — I480 Paroxysmal atrial fibrillation: Secondary | ICD-10-CM | POA: Diagnosis not present

## 2019-01-04 DIAGNOSIS — J441 Chronic obstructive pulmonary disease with (acute) exacerbation: Secondary | ICD-10-CM | POA: Diagnosis not present

## 2019-01-04 DIAGNOSIS — Z87891 Personal history of nicotine dependence: Secondary | ICD-10-CM | POA: Diagnosis not present

## 2019-01-04 DIAGNOSIS — J910 Malignant pleural effusion: Secondary | ICD-10-CM | POA: Diagnosis not present

## 2019-01-04 DIAGNOSIS — I110 Hypertensive heart disease with heart failure: Secondary | ICD-10-CM | POA: Diagnosis not present

## 2019-01-04 DIAGNOSIS — M1990 Unspecified osteoarthritis, unspecified site: Secondary | ICD-10-CM | POA: Diagnosis not present

## 2019-01-04 DIAGNOSIS — M0579 Rheumatoid arthritis with rheumatoid factor of multiple sites without organ or systems involvement: Secondary | ICD-10-CM | POA: Diagnosis not present

## 2019-01-04 DIAGNOSIS — K219 Gastro-esophageal reflux disease without esophagitis: Secondary | ICD-10-CM | POA: Diagnosis not present

## 2019-01-04 DIAGNOSIS — G894 Chronic pain syndrome: Secondary | ICD-10-CM | POA: Diagnosis not present

## 2019-01-04 DIAGNOSIS — G9009 Other idiopathic peripheral autonomic neuropathy: Secondary | ICD-10-CM | POA: Diagnosis not present

## 2019-01-04 DIAGNOSIS — E782 Mixed hyperlipidemia: Secondary | ICD-10-CM | POA: Diagnosis not present

## 2019-01-04 DIAGNOSIS — M329 Systemic lupus erythematosus, unspecified: Secondary | ICD-10-CM | POA: Diagnosis not present

## 2019-01-04 DIAGNOSIS — Z7982 Long term (current) use of aspirin: Secondary | ICD-10-CM | POA: Diagnosis not present

## 2019-01-04 DIAGNOSIS — I5033 Acute on chronic diastolic (congestive) heart failure: Secondary | ICD-10-CM | POA: Diagnosis not present

## 2019-01-04 DIAGNOSIS — Z7952 Long term (current) use of systemic steroids: Secondary | ICD-10-CM | POA: Diagnosis not present

## 2019-01-04 DIAGNOSIS — D72829 Elevated white blood cell count, unspecified: Secondary | ICD-10-CM | POA: Diagnosis not present

## 2019-01-04 DIAGNOSIS — Z9981 Dependence on supplemental oxygen: Secondary | ICD-10-CM | POA: Diagnosis not present

## 2019-01-04 NOTE — Telephone Encounter (Signed)
Please review. Thanks!  

## 2019-01-04 NOTE — Telephone Encounter (Signed)
FYI: Home Health nurse called with pt's HR: 105.

## 2019-01-07 ENCOUNTER — Ambulatory Visit (INDEPENDENT_AMBULATORY_CARE_PROVIDER_SITE_OTHER): Payer: Medicare Other | Admitting: Family Medicine

## 2019-01-07 ENCOUNTER — Other Ambulatory Visit: Payer: Self-pay

## 2019-01-07 ENCOUNTER — Encounter: Payer: Self-pay | Admitting: Family Medicine

## 2019-01-07 VITALS — BP 122/64 | HR 84 | Temp 97.8°F | Resp 98

## 2019-01-07 DIAGNOSIS — C3492 Malignant neoplasm of unspecified part of left bronchus or lung: Secondary | ICD-10-CM

## 2019-01-07 DIAGNOSIS — M059 Rheumatoid arthritis with rheumatoid factor, unspecified: Secondary | ICD-10-CM

## 2019-01-07 DIAGNOSIS — I4891 Unspecified atrial fibrillation: Secondary | ICD-10-CM

## 2019-01-07 DIAGNOSIS — L932 Other local lupus erythematosus: Secondary | ICD-10-CM

## 2019-01-07 DIAGNOSIS — J9611 Chronic respiratory failure with hypoxia: Secondary | ICD-10-CM

## 2019-01-07 DIAGNOSIS — J441 Chronic obstructive pulmonary disease with (acute) exacerbation: Secondary | ICD-10-CM

## 2019-01-07 NOTE — Progress Notes (Signed)
Donna Mcneil  MRN: [id number] DOB: [birth year]  Subjective:  HPI   The patient is a 78 year old female who presents for follow up after hospitalization and rehab admission.  The patient has been home now for about 1 week.  She is currently on 4 LPM of oxygen.   The patient states she is doing pretty well, except she still does not have any appetite.  She does admit to some leg swelling, left worse than right.   The patient has with her a bag of medicine but there is concern about which medicines she is taking and what she should be taking.    Patient Active Problem List   Diagnosis Date Noted  . Leukocytosis   . Pressure injury of skin 11/20/2018  . Tachycardia   . Acute on chronic diastolic CHF (congestive heart failure) ([place])   . Atrial fibrillation with RVR ([place])   . Acute respiratory failure with hypoxia ([place]) 11/19/2018  . Port-A-Cath in place 10/24/2018  . Adenocarcinoma of left lung, stage 4 ([place]) 09/26/2018  . Goals of care, counseling/discussion 09/26/2018  . Encounter for antineoplastic chemotherapy 09/26/2018  . Community acquired pneumonia of left lower lobe of lung ([place])   . Mass of upper lobe of left lung 09/02/2018  . COPD (chronic obstructive pulmonary disease) ([place]) 09/01/2018  . Chronic respiratory failure with hypoxia ([place]) 09/01/2018  . Hyponatremia 09/01/2018  . 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  . 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 Rheumatoid arthritis involving multiple sites with positive rheumatoid factor ([place]) 12/12/2017  . Tobacco use 04/14/2017  . Carotid stenosis 04/14/2017  . Peripheral neuropathy 06/24/2016  . 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 Chronic bronchitis ([place]) 11/13/2015  . Osteoarthritis 10/05/2015  . Hyperlipidemia 10/05/2015  . Hypertension 10/05/2015  . Osteoporosis 10/05/2015  . Acid reflux 10/05/2015  . Peptic ulcer 10/05/2015  . Cataract 10/05/2015  . Tendinitis 10/05/2015  . Osteopenia 02/11/2014  .  Seropositive rheumatoid arthritis ([place]) 02/11/2014   Past Medical History:  Diagnosis Date  . COPD exacerbation ([place]) 09/01/2018  . COVID-19 virus infection 11/28/2018  . Diverticulitis   . Hypercholesteremia   . Hypertension   . Lupus ([place])   . Osteoporosis   . Tobacco use    Past Surgical History:  Procedure Laterality Date  . ABDOMINAL HYSTERECTOMY  12/1969  . APPENDECTOMY  1957  . CATARACT EXTRACTION    . CHOLECYSTECTOMY  1997  . IR IMAGING GUIDED PORT INSERTION  10/16/2018  . LAPAROSCOPIC SIGMOID COLECTOMY  2013   secondary to diverticulitis  . spit seed removal    . TONSILLECTOMY  1945   Family History  Problem Relation Age of Onset  . Alcohol abuse Mother   . Cirrhosis Mother   . Heart disease Father   . Hypertension Father   . Gout Father   . Ulcers Father   . Hyperlipidemia Father   . Alcohol abuse Brother   . Heart disease Brother        MI at age 32  . OCD Son   . Prostatitis Paternal Grandfather   . Pneumonia Paternal Grandfather   . Cancer Son    Social History   Socioeconomic History  . Marital status: Divorced    Spouse name: Not on file  . Number of children: 2  . Years of education: Not on file  . Highest education level: High school graduate  Occupational History  . Occupation: retired  Scientific laboratory technician  . Financial resource strain: Not hard  at all  . Food insecurity:    Worry: Never true    Inability: Never true  . Transportation needs:    Medical: No    Non-medical: No  Tobacco Use  . Smoking status: Former Smoker    Packs/day: 0.50    Types: Cigarettes    Last attempt to quit: 09/01/2018    Years since quitting: 0.3  . Smokeless tobacco: Never Used  . Tobacco comment: started smoking at age 4  Substance and Sexual Activity  . Alcohol use: No    Alcohol/week: 0.0 standard drinks  . Drug use: No  . Sexual activity: Not Currently  Lifestyle  . Physical activity:    Days per week: Not on file    Minutes per session: Not on file  .  Stress: To some extent  Relationships  . Social connections:    Talks on phone: Not on file    Gets together: Not on file    Attends religious service: Not on file    Active member of club or organization: Not on file    Attends meetings of clubs or organizations: Not on file    Relationship status: Not on file  . Intimate partner violence:    Fear of current or ex partner: Not on file    Emotionally abused: Not on file    Physically abused: Not on file    Forced sexual activity: Not on file  Other Topics Concern  . Not on file  Social History Narrative  . Not on file   Outpatient Encounter Medications as of 01/07/2019  Medication Sig  . acetaminophen (TYLENOL) 500 MG tablet Take 1,000 mg by mouth every 6 (six) hours as needed for mild pain.   Marland Kitchen albuterol (PROVENTIL) (2.5 MG/3ML) 0.083% nebulizer solution Take 3 mLs (2.5 mg total) by nebulization every 4 (four) hours as needed for wheezing or shortness of breath.  . ALPRAZolam (XANAX) 1 MG tablet Take 1 tablet (1 mg total) by mouth 2 (two) times daily as needed for anxiety.  Marland Kitchen apixaban (ELIQUIS) 2.5 MG TABS tablet Take 1 tablet (2.5 mg total) by mouth 2 (two) times daily.  Marland Kitchen atorvastatin (LIPITOR) 40 MG tablet TAKE 1 TABLET BY MOUTH ONCE DAILY (Patient taking differently: Take 40 mg by mouth every evening. )  . b complex vitamins tablet Take 2 tablets by mouth 2 (two) times daily.  . bisacodyl (DULCOLAX) 10 MG suppository Place 10 mg rectally as needed for moderate constipation.  . Calcium Carbonate-Vitamin D (CALCIUM 600+D) 600-200 MG-UNIT TABS Take 1 tablet by mouth 2 (two) times daily.   . carboxymethylcellulose (REFRESH TEARS) 0.5 % SOLN Place 1 drop into both eyes daily as needed (dry eyes).   . Coenzyme Q10 (COQ10) 100 MG CAPS Take 100 mg by mouth daily.  Marland Kitchen docusate sodium (COLACE) 100 MG capsule Take 100 mg by mouth 2 (two) times daily.  . feeding supplement, ENSURE ENLIVE, (ENSURE ENLIVE) LIQD Take 237 mLs by mouth 2 (two)  times daily between meals.  . flecainide (TAMBOCOR) 50 MG tablet Take 1 tablet (50 mg total) by mouth every 12 (twelve) hours.  . fluticasone (FLONASE) 50 MCG/ACT nasal spray Place 2 sprays into both nostrils daily.  . folic acid (FOLVITE) 1 MG tablet Take 1 tablet (1 mg total) by mouth daily.  . furosemide (LASIX) 20 MG tablet Take 1 tablet (20 mg total) by mouth 2 (two) times daily.  Marland Kitchen gabapentin (NEURONTIN) 100 MG capsule Take 100 mg by mouth 3 (  three) times daily.   Marland Kitchen guaiFENesin (ROBITUSSIN) 100 MG/5ML liquid Take 200 mg by mouth 3 (three) times daily as needed for cough.  Marland Kitchen HYDROcodone-Acetaminophen (VICODIN) 5-300 MG TABS Take 1 tablet by mouth every 6 (six) hours as needed (severe pain).  . hydroxychloroquine (PLAQUENIL) 200 MG tablet Take 1 tablet by mouth daily.  Marland Kitchen ipratropium (ATROVENT HFA) 17 MCG/ACT inhaler Inhale 2 puffs into the lungs 3 (three) times daily.  Javier Docker Oil 500 MG CAPS Take 500 mg by mouth daily.  Marland Kitchen lidocaine-prilocaine (EMLA) cream Apply 1 application topically as needed.  . loperamide (LOPERAMIDE A-D) 2 MG tablet Take 2 mg by mouth daily as needed for diarrhea or loose stools.  Marland Kitchen loratadine (CLARITIN) 10 MG tablet Take 1 tablet (10 mg total) by mouth daily.  . Melatonin 3 MG TABS Take 3 mg by mouth at bedtime.  . methotrexate (RHEUMATREX) 2.5 MG tablet Take 6 tablets (15 mg total) by mouth every Monday. Hold it for one week and talk to your PCP before resuming it.  . metoprolol tartrate (LOPRESSOR) 100 MG tablet Take 1 tablet (100 mg total) by mouth 2 (two) times daily.  . Multiple Vitamins-Minerals (MULTIVITAMIN ADULT PO) Take 1 tablet by mouth daily.   . Omega 3 1000 MG CAPS Take 1,000-2,000 mg by mouth See admin instructions. Take 2000 mg in the morning and 1000 mg at night  . pantoprazole (PROTONIX) 40 MG tablet Take 1 tablet (40 mg total) by mouth daily at 6 (six) AM.  . polyethylene glycol (MIRALAX / GLYCOLAX) packet Take 17 g by mouth daily as needed for mild  constipation.  . polyvinyl alcohol (LIQUIFILM TEARS) 1.4 % ophthalmic solution Place 1 drop into both eyes daily as needed for dry eyes.  . potassium chloride SA (K-DUR,KLOR-CON) 20 MEQ tablet Take 1 tablet (20 mEq total) by mouth daily.  Marland Kitchen PROAIR HFA 108 (90 Base) MCG/ACT inhaler Inhale 2 puffs into the lungs every 6 (six) hours as needed for wheezing or shortness of breath.  . Probiotic Product (PROBIOTIC DAILY) CAPS Take 1 capsule by mouth 2 (two) times daily.  . prochlorperazine (COMPAZINE) 10 MG tablet Take 1 tablet (10 mg total) by mouth every 6 (six) hours as needed for nausea or vomiting.  . rivaroxaban (XARELTO) 10 MG TABS tablet Take 10 mg by mouth daily. For A-fib  . umeclidinium-vilanterol (ANORO ELLIPTA) 62.5-25 MCG/INH AEPB Inhale 1 puff into the lungs daily.  Marland Kitchen umeclidinium-vilanterol (ANORO ELLIPTA) 62.5-25 MCG/INH AEPB Inhale 1 puff into the lungs daily.   No facility-administered encounter medications on file as of 01/07/2019.    Allergies  Allergen Reactions  . Codeine Other (See Comments)    GI upset  . Cyclosporine Other (See Comments)    Burning eyes  . Other     Other reaction(s): OTHER  . Pregabalin Other (See Comments)    Personality changes-becomes angry  . Zolpidem     Other reaction(s): ITCHING    Review of Systems  Constitutional: Positive for weight loss. Negative for fever.  Respiratory: Positive for shortness of breath. Negative for cough.   Cardiovascular: Positive for leg swelling. Negative for chest pain and palpitations.    Objective:  BP 122/64 (BP Location: Right Arm, Patient Position: Sitting, Cuff Size: Normal)   Pulse 84   Temp 97.8 F (36.6 C) (Oral)   Resp (!) 98   Physical Exam  Constitutional: She is oriented to person, place, and time.  Appears a little pale today but otherwise usual  appearance.  HENT:  Head: Normocephalic.  Eyes: Conjunctivae are normal.  Neck: Normal range of motion. Neck supple.  Cardiovascular: Normal rate.   Irregularly irregular rhythm.  Pulmonary/Chest: Effort normal.  Distant breath sounds without rales or rhonchi.  Abdominal: Soft. Bowel sounds are normal.  Musculoskeletal:     Comments: Mid back pain  Lymphadenopathy:    She has no cervical adenopathy.  Neurological: She is alert and oriented to person, place, and time.  Skin: No rash noted.  Psychiatric: Mood, affect and judgment normal.    Assessment and Plan :  1. Adenocarcinoma of left lung, stage 4 (HCC) Hospitalized on 11-19-18 for shortness of breath and low grade fever up to 99.5 at home. COVID-19 test negative. PMH significant for RA (on methotrexate), lupus, COPD, malignant pleural effusion, stage IV non-smll cell lung cancer diagnosed in January 2020. Following with Dr. Julien Nordmann. On systemic chemotherapy (carboplatin for AUC of 5, Alimta 500 mg/M2 and Avastin 15 mg/KG) every 3 weeks. Was discharged after antibiotic therapy and sent to rehab center and discharged a week ago. Still weak and having chronic respiratory failure from COPD with lung cancer. Pulse oximetry kept up to 95-98% using oxygen at 4 LPM. When dropped to 3 LMP pulse oximetry drops to 90%. Will be getting her next chemotherapy 01-09-19. Scheduled for home health assistance and visiting nurse visit this week.  2. COPD with acute exacerbation ([place]) Fair control on Anoro 1 inhalation daily and Albuterol by nebulizer q 4 hours prn wheeze. Followed by Dr. Lake Bells (pulmonologist).   3. Chronic respiratory failure with hypoxia (HCC) Breathing easier when on 4 LPM of oxygen and pulse oximetry up to 95-98%. If oxygen flow drops to 3 LPM, pulse oximetry drops to 90%. 4 LPM of oxygen by nasal cannula medically necessary to maintain oxygenation. Breathing easier since humidity added to oxygen generator. Continue regular follow up with pulmonologist.  4. Seropositive rheumatoid arthritis ([place]) Followed by Dr. Jefm Bryant (rheumatologist). Presently on Methotrexate 2.5 mg 6 tablets  every Monday with Plaquenil 200 mg qd. Feels joint pains are fairly well controlled. Follow up with rheumatologist regularly. May need to consider changes pending chemotherapy needs for lung cancer.  5. Cutaneous lupus erythematosus Well controlled with Methotrexate and Plaquenil.  6. Atrial fibrillation with RVR (HCC) Irregularly irregular heart rhythm today. Was stabilized in the hospital on Lopressor 100 mg BID and Flecainide 50 mg BID. Placed on Eliquis 2.5 mg BID for anticoagulant therapy. Uses Lasix 20 mg BID prn edema and Potassium 20 meq qd with Lasix dosing. Will get follow up labs at appointment with Dr. Julien Nordmann (oncologist) at next chemotherapy on 01-09-19 to follow up hypokalemia. Should keep follow up appointment with Dr. Johnsie Cancel (cardiologist) as planned assessment of progress of control and CHF.

## 2019-01-08 ENCOUNTER — Telehealth: Payer: Self-pay | Admitting: Pulmonary Disease

## 2019-01-08 ENCOUNTER — Telehealth: Payer: Self-pay

## 2019-01-08 DIAGNOSIS — C3492 Malignant neoplasm of unspecified part of left bronchus or lung: Secondary | ICD-10-CM | POA: Diagnosis not present

## 2019-01-08 DIAGNOSIS — J449 Chronic obstructive pulmonary disease, unspecified: Secondary | ICD-10-CM | POA: Diagnosis not present

## 2019-01-08 DIAGNOSIS — J9611 Chronic respiratory failure with hypoxia: Secondary | ICD-10-CM | POA: Diagnosis not present

## 2019-01-08 NOTE — Telephone Encounter (Signed)
[facility] called and would like a verbal order for PT for patient.  1 time for 5 weeks for strengthening. Please advise.

## 2019-01-08 NOTE — Telephone Encounter (Signed)
Called and spoke with patient and pt's husband They advised that the O2 concentrator and POC was delivered to their home today Pt is using the O2 at 4L, and she is doing well Pt verbalized and expressed understanding that I made note of this information Nothing further needed at this time.

## 2019-01-08 NOTE — Telephone Encounter (Signed)
Advised 

## 2019-01-08 NOTE — Telephone Encounter (Signed)
Agree with need for PT with diagnoses of COPD with chronic respiratory failure, rheumatoid arthritis and recent diagnosis of pulmonary adenocarcinoma with chemotherapy. Give verbal order to proceed with PT as stated above.

## 2019-01-09 ENCOUNTER — Inpatient Hospital Stay: Payer: Medicare Other

## 2019-01-09 ENCOUNTER — Encounter: Payer: Self-pay | Admitting: Internal Medicine

## 2019-01-09 ENCOUNTER — Inpatient Hospital Stay (HOSPITAL_BASED_OUTPATIENT_CLINIC_OR_DEPARTMENT_OTHER): Payer: Medicare Other | Admitting: Internal Medicine

## 2019-01-09 ENCOUNTER — Other Ambulatory Visit: Payer: Self-pay

## 2019-01-09 VITALS — BP 152/76 | HR 73 | Temp 98.1°F | Resp 20 | Ht 62.0 in | Wt 113.1 lb

## 2019-01-09 DIAGNOSIS — F17210 Nicotine dependence, cigarettes, uncomplicated: Secondary | ICD-10-CM

## 2019-01-09 DIAGNOSIS — M069 Rheumatoid arthritis, unspecified: Secondary | ICD-10-CM | POA: Diagnosis not present

## 2019-01-09 DIAGNOSIS — C3492 Malignant neoplasm of unspecified part of left bronchus or lung: Secondary | ICD-10-CM

## 2019-01-09 DIAGNOSIS — Z8619 Personal history of other infectious and parasitic diseases: Secondary | ICD-10-CM | POA: Diagnosis not present

## 2019-01-09 DIAGNOSIS — C782 Secondary malignant neoplasm of pleura: Secondary | ICD-10-CM

## 2019-01-09 DIAGNOSIS — J910 Malignant pleural effusion: Secondary | ICD-10-CM | POA: Diagnosis not present

## 2019-01-09 DIAGNOSIS — Z79899 Other long term (current) drug therapy: Secondary | ICD-10-CM

## 2019-01-09 DIAGNOSIS — J449 Chronic obstructive pulmonary disease, unspecified: Secondary | ICD-10-CM

## 2019-01-09 DIAGNOSIS — Z5111 Encounter for antineoplastic chemotherapy: Secondary | ICD-10-CM

## 2019-01-09 DIAGNOSIS — M810 Age-related osteoporosis without current pathological fracture: Secondary | ICD-10-CM

## 2019-01-09 DIAGNOSIS — Z9181 History of falling: Secondary | ICD-10-CM

## 2019-01-09 DIAGNOSIS — Z95828 Presence of other vascular implants and grafts: Secondary | ICD-10-CM

## 2019-01-09 DIAGNOSIS — M329 Systemic lupus erythematosus, unspecified: Secondary | ICD-10-CM

## 2019-01-09 DIAGNOSIS — Z7901 Long term (current) use of anticoagulants: Secondary | ICD-10-CM | POA: Diagnosis not present

## 2019-01-09 DIAGNOSIS — Z5112 Encounter for antineoplastic immunotherapy: Secondary | ICD-10-CM | POA: Diagnosis not present

## 2019-01-09 DIAGNOSIS — C3490 Malignant neoplasm of unspecified part of unspecified bronchus or lung: Secondary | ICD-10-CM

## 2019-01-09 DIAGNOSIS — I10 Essential (primary) hypertension: Secondary | ICD-10-CM | POA: Diagnosis not present

## 2019-01-09 DIAGNOSIS — R262 Difficulty in walking, not elsewhere classified: Secondary | ICD-10-CM

## 2019-01-09 DIAGNOSIS — R0602 Shortness of breath: Secondary | ICD-10-CM | POA: Diagnosis not present

## 2019-01-09 DIAGNOSIS — M6281 Muscle weakness (generalized): Secondary | ICD-10-CM

## 2019-01-09 DIAGNOSIS — Z8719 Personal history of other diseases of the digestive system: Secondary | ICD-10-CM

## 2019-01-09 DIAGNOSIS — C3412 Malignant neoplasm of upper lobe, left bronchus or lung: Secondary | ICD-10-CM

## 2019-01-09 DIAGNOSIS — C778 Secondary and unspecified malignant neoplasm of lymph nodes of multiple regions: Secondary | ICD-10-CM

## 2019-01-09 LAB — CMP (CANCER CENTER ONLY)
ALT: 21 U/L (ref 0–44)
AST: 33 U/L (ref 15–41)
Albumin: 2 g/dL — ABNORMAL LOW (ref 3.5–5.0)
Alkaline Phosphatase: 134 U/L — ABNORMAL HIGH (ref 38–126)
Anion gap: 8 (ref 5–15)
BUN: 9 mg/dL (ref 8–23)
CO2: 30 mmol/L (ref 22–32)
Calcium: 7.8 mg/dL — ABNORMAL LOW (ref 8.9–10.3)
Chloride: 95 mmol/L — ABNORMAL LOW (ref 98–111)
Creatinine: 0.67 mg/dL (ref 0.44–1.00)
GFR, Est AFR Am: >60 mL/min (ref >60)
GFR, Estimated: >60 mL/min (ref >60)
Glucose, Bld: 104 mg/dL — ABNORMAL HIGH (ref 70–99)
Potassium: 3.1 mmol/L — ABNORMAL LOW (ref 3.5–5.1)
Sodium: 133 mmol/L — ABNORMAL LOW (ref 135–145)
Total Bilirubin: 0.2 mg/dL — ABNORMAL LOW (ref 0.3–1.2)
Total Protein: 7 g/dL (ref 6.5–8.1)

## 2019-01-09 LAB — CBC WITH DIFFERENTIAL (CANCER CENTER ONLY)
Abs Immature Granulocytes: 0.04 10*3/uL (ref 0.00–0.07)
Basophils Absolute: 0.1 10*3/uL (ref 0.0–0.1)
Basophils Relative: 2 %
Eosinophils Absolute: 0.1 10*3/uL (ref 0.0–0.5)
Eosinophils Relative: 1 %
HCT: 31.2 % — ABNORMAL LOW (ref 36.0–46.0)
Hemoglobin: 10 g/dL — ABNORMAL LOW (ref 12.0–15.0)
Immature Granulocytes: 1 %
Lymphocytes Relative: 39 %
Lymphs Abs: 2.8 10*3/uL (ref 0.7–4.0)
MCH: 30.8 pg (ref 26.0–34.0)
MCHC: 32.1 g/dL (ref 30.0–36.0)
MCV: 96 fL (ref 80.0–100.0)
Monocytes Absolute: 1.1 10*3/uL — ABNORMAL HIGH (ref 0.1–1.0)
Monocytes Relative: 15 %
Neutro Abs: 3.2 10*3/uL (ref 1.7–7.7)
Neutrophils Relative %: 42 %
Platelet Count: 512 10*3/uL — ABNORMAL HIGH (ref 150–400)
RBC: 3.25 MIL/uL — ABNORMAL LOW (ref 3.87–5.11)
RDW: 19.9 % — ABNORMAL HIGH (ref 11.5–15.5)
WBC Count: 7.3 10*3/uL (ref 4.0–10.5)
nRBC: 0 % (ref 0.0–0.2)

## 2019-01-09 LAB — TOTAL PROTEIN, URINE DIPSTICK: Protein, ur: 30 mg/dL — AB

## 2019-01-09 MED ORDER — SODIUM CHLORIDE 0.9 % IV SOLN
15.5000 mg/kg | Freq: Once | INTRAVENOUS | Status: AC
  Administered 2019-01-09: 14:00:00 800 mg via INTRAVENOUS
  Filled 2019-01-09: qty 32

## 2019-01-09 MED ORDER — SODIUM CHLORIDE 0.9 % IV SOLN: 15.0000 mg/kg | Freq: Once | INTRAVENOUS | Status: DC

## 2019-01-09 MED ORDER — SODIUM CHLORIDE 0.9 % IV SOLN
Freq: Once | INTRAVENOUS | Status: AC
  Administered 2019-01-09: 13:00:00 via INTRAVENOUS
  Filled 2019-01-09: qty 250

## 2019-01-09 MED ORDER — SODIUM CHLORIDE 0.9% FLUSH
10.0000 mL | INTRAVENOUS | Status: DC | PRN
  Administered 2019-01-09: 16:00:00 10 mL
  Filled 2019-01-09: qty 10

## 2019-01-09 MED ORDER — HEPARIN SOD (PORK) LOCK FLUSH 100 UNIT/ML IV SOLN
500.0000 [IU] | Freq: Once | INTRAVENOUS | Status: AC | PRN
  Administered 2019-01-09: 16:00:00 500 [IU]
  Filled 2019-01-09: qty 5

## 2019-01-09 MED ORDER — SODIUM CHLORIDE 0.9 % IV SOLN
400.0000 mg/m2 | Freq: Once | INTRAVENOUS | Status: AC
  Administered 2019-01-09: 15:00:00 600 mg via INTRAVENOUS
  Filled 2019-01-09: qty 4

## 2019-01-09 MED ORDER — SODIUM CHLORIDE 0.9 % IV SOLN: 400.0000 mg/m2 | Freq: Once | INTRAVENOUS | Status: DC

## 2019-01-09 MED ORDER — POTASSIUM CHLORIDE ER 20 MEQ PO TBCR: 20.0000 meq | EXTENDED_RELEASE_TABLET | Freq: Every day | ORAL | 0 refills | Status: DC

## 2019-01-09 MED ORDER — SODIUM CHLORIDE 0.9 % IV SOLN: 282.0000 mg | Freq: Once | INTRAVENOUS | Status: DC

## 2019-01-09 MED ORDER — PALONOSETRON HCL INJECTION 0.25 MG/5ML
INTRAVENOUS | Status: AC
  Filled 2019-01-09: qty 5

## 2019-01-09 MED ORDER — SODIUM CHLORIDE 0.9 % IV SOLN
250.0000 mg | Freq: Once | INTRAVENOUS | Status: AC
  Administered 2019-01-09: 250 mg via INTRAVENOUS
  Filled 2019-01-09: qty 25

## 2019-01-09 MED ORDER — PALONOSETRON HCL INJECTION 0.25 MG/5ML
0.2500 mg | Freq: Once | INTRAVENOUS | Status: AC
  Administered 2019-01-09: 14:00:00 0.25 mg via INTRAVENOUS

## 2019-01-09 MED ORDER — SODIUM CHLORIDE 0.9% FLUSH
10.0000 mL | INTRAVENOUS | Status: DC | PRN
  Administered 2019-01-09: 10 mL
  Filled 2019-01-09: qty 10

## 2019-01-09 MED ORDER — SODIUM CHLORIDE 0.9 % IV SOLN
Freq: Once | INTRAVENOUS | Status: AC
  Administered 2019-01-09: 14:00:00 via INTRAVENOUS
  Filled 2019-01-09: qty 5

## 2019-01-09 NOTE — Progress Notes (Signed)
[facility] [phone]   [phone]  OFFICE PROGRESS NOTE  Donna Mcneil, Donna Mcneil [id number]  DIAGNOSIS: Stage IV (T1b, N2, M1a) non-small cell lung cancer adenocarcinoma  presented with left upper lobe nodule in addition to enlarged left hilar and subcarinal lymphadenopathy as well as loculated malignant left pleural effusion and the pleural metastatic disease diagnosed in January 2020 The patient has a history of lupus as well as rheumatoid arthritis.  She is not a candidate for immunotherapy.  PRIOR THERAPY:None  CURRENT THERAPY: Systemic chemotherapy with carboplatin for AUC of 5, Alimta 500 mg/M2 and a Avastin 15 mg/KG every 3 weeks.  First dose October 09, 2018.  Status post 3 cycles.  INTERVAL HISTORY: Donna Mcneil 78 y.o. female returns to the clinic today for follow-up visit.  The patient continues to complain of shortness of breath at baseline increased with exertion.  She lost a lot of weight recently.  She denied having any current nausea, vomiting, diarrhea or constipation.  She has no chest pain or hemoptysis.  She denied having any headache or visual changes.  The patient is here today for evaluation before starting cycle #4 of her treatment.  MEDICAL HISTORY: Past Medical History:  Diagnosis Date  . COPD exacerbation ([place]) 09/01/2018  . COVID-19 virus infection 11/28/2018  . Diverticulitis   . Hypercholesteremia   . Hypertension   . Lupus ([place])   . Osteoporosis   . Tobacco use     ALLERGIES:  is allergic to codeine; cyclosporine; other; pregabalin; and zolpidem.  MEDICATIONS:  Current Outpatient Medications  Medication Sig Dispense Refill  . acetaminophen (TYLENOL) 500 MG tablet Take 1,000 mg by mouth every 6 (six) hours as needed for mild pain.     Marland Kitchen albuterol (PROVENTIL) (2.5 MG/3ML) 0.083% nebulizer solution Take 3 mLs (2.5 mg total) by nebulization every 4 (four) hours as needed for  wheezing or shortness of breath. 180 mL 2  . ALPRAZolam (XANAX) 1 MG tablet Take 1 tablet (1 mg total) by mouth 2 (two) times daily as needed for anxiety. 20 tablet 0  . apixaban (ELIQUIS) 2.5 MG TABS tablet Take 1 tablet (2.5 mg total) by mouth 2 (two) times daily. 60 tablet 0  . atorvastatin (LIPITOR) 40 MG tablet TAKE 1 TABLET BY MOUTH ONCE DAILY (Patient taking differently: Take 40 mg by mouth every evening. ) 90 tablet 3  . b complex vitamins tablet Take 2 tablets by mouth 2 (two) times daily.    . bisacodyl (DULCOLAX) 10 MG suppository Place 10 mg rectally as needed for moderate constipation.    . Calcium Carbonate-Vitamin D (CALCIUM 600+D) 600-200 MG-UNIT TABS Take 1 tablet by mouth 2 (two) times daily.     . carboxymethylcellulose (REFRESH TEARS) 0.5 % SOLN Place 1 drop into both eyes daily as needed (dry eyes).     . Coenzyme Q10 (COQ10) 100 MG CAPS Take 100 mg by mouth daily.    Marland Kitchen docusate sodium (COLACE) 100 MG capsule Take 100 mg by mouth 2 (two) times daily.    . feeding supplement, ENSURE ENLIVE, (ENSURE ENLIVE) LIQD Take 237 mLs by mouth 2 (two) times daily between meals. 237 mL 12  . flecainide (TAMBOCOR) 50 MG tablet Take 1 tablet (50 mg total) by mouth every 12 (twelve) hours. 60 tablet 0  . fluticasone (FLONASE) 50 MCG/ACT nasal spray Place 2 sprays into both nostrils daily. 16 g 2  . folic acid (  FOLVITE) 1 MG tablet Take 1 tablet (1 mg total) by mouth daily. 30 tablet 4  . furosemide (LASIX) 20 MG tablet Take 1 tablet (20 mg total) by mouth 2 (two) times daily. 60 tablet 0  . gabapentin (NEURONTIN) 100 MG capsule Take 100 mg by mouth 3 (three) times daily.     Marland Kitchen guaiFENesin (ROBITUSSIN) 100 MG/5ML liquid Take 200 mg by mouth 3 (three) times daily as needed for cough.    Marland Kitchen HYDROcodone-Acetaminophen (VICODIN) 5-300 MG TABS Take 1 tablet by mouth every 6 (six) hours as needed (severe pain).    . hydroxychloroquine (PLAQUENIL) 200 MG tablet Take 1 tablet by mouth daily.    Marland Kitchen  ipratropium (ATROVENT HFA) 17 MCG/ACT inhaler Inhale 2 puffs into the lungs 3 (three) times daily. 1 Inhaler 12  . Krill Oil 500 MG CAPS Take 500 mg by mouth daily.    Marland Kitchen lidocaine-prilocaine (EMLA) cream Apply 1 application topically as needed. 30 g 0  . loperamide (LOPERAMIDE A-D) 2 MG tablet Take 2 mg by mouth daily as needed for diarrhea or loose stools.    Marland Kitchen loratadine (CLARITIN) 10 MG tablet Take 1 tablet (10 mg total) by mouth daily.    . Melatonin 3 MG TABS Take 3 mg by mouth at bedtime.    . methotrexate (RHEUMATREX) 2.5 MG tablet Take 6 tablets (15 mg total) by mouth every Monday. Hold it for one week and talk to your PCP before resuming it. 4 tablet 0  . metoprolol tartrate (LOPRESSOR) 100 MG tablet Take 1 tablet (100 mg total) by mouth 2 (two) times daily. 60 tablet 0  . Multiple Vitamins-Minerals (MULTIVITAMIN ADULT PO) Take 1 tablet by mouth daily.     . Omega 3 1000 MG CAPS Take 1,000-2,000 mg by mouth See admin instructions. Take 2000 mg in the morning and 1000 mg at night    . pantoprazole (PROTONIX) 40 MG tablet Take 1 tablet (40 mg total) by mouth daily at 6 (six) AM. 30 tablet 0  . polyethylene glycol (MIRALAX / GLYCOLAX) packet Take 17 g by mouth daily as needed for mild constipation. 14 each 0  . polyvinyl alcohol (LIQUIFILM TEARS) 1.4 % ophthalmic solution Place 1 drop into both eyes daily as needed for dry eyes. 15 mL 0  . potassium chloride SA (K-DUR,KLOR-CON) 20 MEQ tablet Take 1 tablet (20 mEq total) by mouth daily. 7 tablet 0  . PROAIR HFA 108 (90 Base) MCG/ACT inhaler Inhale 2 puffs into the lungs every 6 (six) hours as needed for wheezing or shortness of breath. 3 Inhaler 2  . Probiotic Product (PROBIOTIC DAILY) CAPS Take 1 capsule by mouth 2 (two) times daily.    . prochlorperazine (COMPAZINE) 10 MG tablet Take 1 tablet (10 mg total) by mouth every 6 (six) hours as needed for nausea or vomiting. 30 tablet 0  . rivaroxaban (XARELTO) 10 MG TABS tablet Take 10 mg by mouth  daily. For A-fib    . umeclidinium-vilanterol (ANORO ELLIPTA) 62.5-25 MCG/INH AEPB Inhale 1 puff into the lungs daily. 60 each 0   No current facility-administered medications for this visit.     SURGICAL HISTORY:  Past Surgical History:  Procedure Laterality Date  . ABDOMINAL HYSTERECTOMY  12/1969  . APPENDECTOMY  1957  . CATARACT EXTRACTION    . CHOLECYSTECTOMY  1997  . IR IMAGING GUIDED PORT INSERTION  10/16/2018  . LAPAROSCOPIC SIGMOID COLECTOMY  2013   secondary to diverticulitis  . spit seed removal    .  TONSILLECTOMY  1945    REVIEW OF SYSTEMS:  A comprehensive review of systems was negative except for: Constitutional: positive for fatigue and weight loss Respiratory: positive for dyspnea on exertion Musculoskeletal: positive for muscle weakness   PHYSICAL EXAMINATION: General appearance: alert, cooperative, fatigued and no distress Head: Normocephalic, without obvious abnormality, atraumatic Neck: no adenopathy, no JVD, supple, symmetrical, trachea midline and thyroid not enlarged, symmetric, no tenderness/mass/nodules Lymph nodes: Cervical, supraclavicular, and axillary nodes normal. Resp: wheezes bilaterally Back: symmetric, no curvature. ROM normal. No CVA tenderness. Cardio: regular rate and rhythm, S1, S2 normal, no murmur, click, rub or gallop GI: soft, non-tender; bowel sounds normal; no masses,  no organomegaly Extremities: extremities normal, atraumatic, no cyanosis or edema  ECOG PERFORMANCE STATUS: 1 - Symptomatic but completely ambulatory  Blood pressure (!) 152/76, pulse 73, temperature 98.1 F (36.7 C), temperature source Oral, resp. rate 20, height 5\' 2"  (1.575 m), weight 113 lb 1.6 oz (51.3 kg), SpO2 100 %.  LABORATORY DATA: Lab Results  Component Value Date   WBC 7.3 01/09/2019   HGB 10.0 (L) 01/09/2019   HCT 31.2 (L) 01/09/2019   MCV 96.0 01/09/2019   PLT 512 (H) 01/09/2019      Chemistry      Component Value Date/Time   NA 129 (L)  01/01/2019 1131   NA 136 05/14/2018 1225   NA 134 (L) 04/23/2012 0640   K 3.8 01/01/2019 1131   K 2.8 (L) 04/23/2012 0640   CL 93 (L) 01/01/2019 1131   CL 91 (L) 04/23/2012 0640   CO2 28 01/01/2019 1131   CO2 32 04/23/2012 0640   BUN 6 (L) 01/01/2019 1131   BUN 8 05/14/2018 1225   BUN 13 04/23/2012 0640   CREATININE 0.57 01/01/2019 1131   CREATININE 0.72 07/04/2017 1045      Component Value Date/Time   CALCIUM 7.8 (L) 01/01/2019 1131   CALCIUM 8.3 (L) 04/23/2012 0640   ALKPHOS 98 01/01/2019 1131   ALKPHOS 54 04/05/2012 0957   AST 32 01/01/2019 1131   ALT 20 01/01/2019 1131   ALT 23 04/05/2012 0957   BILITOT 0.3 01/01/2019 1131       RADIOGRAPHIC STUDIES: No results found.  ASSESSMENT AND PLAN: This is a very pleasant 78 years old white female recently diagnosed with stage IV (T1b, N2, M1a) non-small cell lung cancer highly suspicious for adenocarcinoma presented with left upper lobe lung nodule in addition to left hilar and mediastinal lymphadenopathy as well as malignant right pleural effusion and pleural based metastasis diagnosed in January 2020. The patient was started on systemic chemotherapy with carboplatin, Alimta and Avastin status post 3 cycles.   She is tolerating this treatment well with no concerning adverse effects except for fatigue. I recommended for the patient to proceed with cycle #4 today as planned. For hypertension she was advised to take her blood pressure medication as prescribed. I will see her back for follow-up visit in 3 weeks for evaluation with repeat CT scan of the chest, abdomen and pelvis for restaging of her disease. She was advised to call immediately if she has any concerning symptoms in the interval. The patient voices understanding of current disease status and treatment options and is in agreement with the current care plan. All questions were answered. The patient knows to call the clinic with any problems, questions or concerns. We can  certainly see the patient much sooner if necessary.  I spent 10 minutes counseling the patient face to face. The total  time spent in the appointment was 15 minutes.  Disclaimer: This note was dictated with voice recognition software. Similar sounding words can inadvertently be transcribed and may not be corrected upon review.

## 2019-01-09 NOTE — Patient Instructions (Signed)

## 2019-01-09 NOTE — Progress Notes (Signed)
Per Dr. Julien Nordmann, adjust bevacizumab, pemetrexed, and carboplatin for weight loss. Orders updated for today and future treatments.  Demetrius Charity, PharmD, [facility] Pharmac[facility] Phone: [phone] 01/09/2019

## 2019-01-09 NOTE — Patient Instructions (Signed)
Discharge Instructions for Patients Receiving Chemotherapy  Today you received the following chemotherapy agents Bevacizumab (AVASTIN), Pemetrexed (ALIMTA) & Carboplatin (PARAPLATIN).  To help prevent nausea and vomiting after your treatment, we encourage you to take your nausea medication as prescribed.   If you develop nausea and vomiting that is not controlled by your nausea medication, call the clinic.   BELOW ARE SYMPTOMS THAT SHOULD BE REPORTED IMMEDIATELY:  *FEVER GREATER THAN 100.5 F  *CHILLS WITH OR WITHOUT FEVER  NAUSEA AND VOMITING THAT IS NOT CONTROLLED WITH YOUR NAUSEA MEDICATION  *UNUSUAL SHORTNESS OF BREATH  *UNUSUAL BRUISING OR BLEEDING  TENDERNESS IN MOUTH AND THROAT WITH OR WITHOUT PRESENCE OF ULCERS  *URINARY PROBLEMS  *BOWEL PROBLEMS  UNUSUAL RASH Items with * indicate a potential emergency and should be followed up as soon as possible.  Feel free to call the clinic should you have any questions or concerns. The clinic phone number is [phone].  Please show the [facility] at check-in to the Emergency Department and triage nurse.  Coronavirus (COVID-19) Are you at risk?  Are you at risk for the Coronavirus (COVID-19)?  To be considered HIGH RISK for Coronavirus (COVID-19), you have to meet the following criteria:  . Traveled to [place], [place], [place], [place] or [place]; or in the [place] to [place], [place], [place], or [place]; and have fever, cough, and shortness of breath within the last 2 weeks of travel OR . Been in close contact with a person diagnosed with COVID-19 within the last 2 weeks and have fever, cough, and shortness of breath . IF YOU DO NOT MEET THESE CRITERIA, YOU ARE CONSIDERED LOW RISK FOR COVID-19.  What to do if you are HIGH RISK for COVID-19?  Marland Kitchen If you are having a medical emergency, call 911. . Seek medical care right away. Before you go to a doctor's office, urgent care or  emergency department, call ahead and tell them about your recent travel, contact with someone diagnosed with COVID-19, and your symptoms. You should receive instructions from your physician's office regarding next steps of care.  . When you arrive at healthcare provider, tell the healthcare staff immediately you have returned from visiting [place], [place], [place], [place] or [place]; or traveled in the [place] to [place], [place], [place], or [place]; in the last two weeks or you have been in close contact with a person diagnosed with COVID-19 in the last 2 weeks.   . Tell the health care staff about your symptoms: fever, cough and shortness of breath. . After you have been seen by a medical provider, you will be either: o Tested for (COVID-19) and discharged home on quarantine except to seek medical care if symptoms worsen, and asked to  - Stay home and avoid contact with others until you get your results (4-5 days)  - Avoid travel on public transportation if possible (such as bus, train, or airplane) or o Sent to the Emergency Department by EMS for evaluation, COVID-19 testing, and possible admission depending on your condition and test results.  What to do if you are LOW RISK for COVID-19?  Reduce your risk of any infection by using the same precautions used for avoiding the common cold or flu:  Marland Kitchen Wash your hands often with soap and warm water for at least 20 seconds.  If soap and water are not readily available, use an alcohol-based hand sanitizer with at least 60% alcohol.  Marland Kitchen  If coughing or sneezing, cover your mouth and nose by coughing or sneezing into the elbow areas of your shirt or coat, into a tissue or into your sleeve (not your hands). . Avoid shaking hands with others and consider head nods or verbal greetings only. . Avoid touching your eyes, nose, or mouth with unwashed hands.  . Avoid close contact with people who are sick. . Avoid places or events with large numbers  of people in one location, like concerts or sporting events. . Carefully consider travel plans you have or are making. . If you are planning any travel outside or inside the [place], visit the CDC's Travelers' Health webpage for the latest health notices. . If you have some symptoms but not all symptoms, continue to monitor at home and seek medical attention if your symptoms worsen. . If you are having a medical emergency, call 911.   [place] / e-Visit: [email or web address]         [facility]: [facility]: [phone]                   [facility]: [phone]

## 2019-01-10 ENCOUNTER — Telehealth: Payer: Self-pay | Admitting: Medical Oncology

## 2019-01-10 DIAGNOSIS — K219 Gastro-esophageal reflux disease without esophagitis: Secondary | ICD-10-CM | POA: Diagnosis not present

## 2019-01-10 DIAGNOSIS — E782 Mixed hyperlipidemia: Secondary | ICD-10-CM | POA: Diagnosis not present

## 2019-01-10 DIAGNOSIS — Z9981 Dependence on supplemental oxygen: Secondary | ICD-10-CM | POA: Diagnosis not present

## 2019-01-10 DIAGNOSIS — I480 Paroxysmal atrial fibrillation: Secondary | ICD-10-CM | POA: Diagnosis not present

## 2019-01-10 DIAGNOSIS — D72829 Elevated white blood cell count, unspecified: Secondary | ICD-10-CM | POA: Diagnosis not present

## 2019-01-10 DIAGNOSIS — C3492 Malignant neoplasm of unspecified part of left bronchus or lung: Secondary | ICD-10-CM | POA: Diagnosis not present

## 2019-01-10 DIAGNOSIS — Z7901 Long term (current) use of anticoagulants: Secondary | ICD-10-CM | POA: Diagnosis not present

## 2019-01-10 DIAGNOSIS — Z87891 Personal history of nicotine dependence: Secondary | ICD-10-CM | POA: Diagnosis not present

## 2019-01-10 DIAGNOSIS — Z7982 Long term (current) use of aspirin: Secondary | ICD-10-CM | POA: Diagnosis not present

## 2019-01-10 DIAGNOSIS — M1990 Unspecified osteoarthritis, unspecified site: Secondary | ICD-10-CM | POA: Diagnosis not present

## 2019-01-10 DIAGNOSIS — J910 Malignant pleural effusion: Secondary | ICD-10-CM | POA: Diagnosis not present

## 2019-01-10 DIAGNOSIS — I110 Hypertensive heart disease with heart failure: Secondary | ICD-10-CM | POA: Diagnosis not present

## 2019-01-10 DIAGNOSIS — I5033 Acute on chronic diastolic (congestive) heart failure: Secondary | ICD-10-CM | POA: Diagnosis not present

## 2019-01-10 DIAGNOSIS — Z7952 Long term (current) use of systemic steroids: Secondary | ICD-10-CM | POA: Diagnosis not present

## 2019-01-10 DIAGNOSIS — J9621 Acute and chronic respiratory failure with hypoxia: Secondary | ICD-10-CM | POA: Diagnosis not present

## 2019-01-10 DIAGNOSIS — J441 Chronic obstructive pulmonary disease with (acute) exacerbation: Secondary | ICD-10-CM | POA: Diagnosis not present

## 2019-01-10 DIAGNOSIS — M329 Systemic lupus erythematosus, unspecified: Secondary | ICD-10-CM | POA: Diagnosis not present

## 2019-01-10 DIAGNOSIS — M0579 Rheumatoid arthritis with rheumatoid factor of multiple sites without organ or systems involvement: Secondary | ICD-10-CM | POA: Diagnosis not present

## 2019-01-10 DIAGNOSIS — G9009 Other idiopathic peripheral autonomic neuropathy: Secondary | ICD-10-CM | POA: Diagnosis not present

## 2019-01-10 DIAGNOSIS — G894 Chronic pain syndrome: Secondary | ICD-10-CM | POA: Diagnosis not present

## 2019-01-10 DIAGNOSIS — Z9181 History of falling: Secondary | ICD-10-CM | POA: Diagnosis not present

## 2019-01-10 NOTE — Telephone Encounter (Signed)
Ct scan - asking about appt . I told him to expect a call from central scheduling.

## 2019-01-11 ENCOUNTER — Telehealth: Payer: Self-pay | Admitting: *Deleted

## 2019-01-11 NOTE — Telephone Encounter (Signed)
Please review

## 2019-01-11 NOTE — Telephone Encounter (Signed)
ch

## 2019-01-11 NOTE — Telephone Encounter (Signed)
Diane from [facility] called to let Simona Huh know that patient has postponed all home health services for the next 2 weeks. Diane would like to speak to [facility] or his CMA concerning this. Please advise?

## 2019-01-11 NOTE — Telephone Encounter (Signed)
Called patient to discuss medications and home health nurse concerns. Patient has been reluctant to proceed with visits and evaluations by PT, home assistance, etc since son has come in from [place] (he will be there until mid June). He has been trying to arrange medications into categories of daily use and prn use. He has also been trying to arrange home to reduce risk of falls and allow her to be a little more mobile. He has been cooking some to try to help her eat better. She states her visit with Dr. Earlie Server (oncologist) for chemotherapy went well and he advised her to continue her "usual" medication list she had with her on 01-09-19. Pulmonologist reported portable oxygen concentrator was delivered and set up. She states she has been more energetic and walking a little more with son's assistance. I advised Lucianne Lei [phone] at [facility]) that a more accurate medication list will be faxed [phone]) after her appointment here on 01-15-19.

## 2019-01-15 ENCOUNTER — Inpatient Hospital Stay: Payer: Medicare Other

## 2019-01-15 ENCOUNTER — Other Ambulatory Visit: Payer: Self-pay

## 2019-01-15 ENCOUNTER — Ambulatory Visit (INDEPENDENT_AMBULATORY_CARE_PROVIDER_SITE_OTHER): Payer: Medicare Other | Admitting: Family Medicine

## 2019-01-15 VITALS — BP 124/62 | HR 106 | Temp 98.0°F

## 2019-01-15 DIAGNOSIS — I4891 Unspecified atrial fibrillation: Secondary | ICD-10-CM

## 2019-01-15 DIAGNOSIS — J9611 Chronic respiratory failure with hypoxia: Secondary | ICD-10-CM

## 2019-01-15 DIAGNOSIS — Z8619 Personal history of other infectious and parasitic diseases: Secondary | ICD-10-CM | POA: Diagnosis not present

## 2019-01-15 DIAGNOSIS — J449 Chronic obstructive pulmonary disease, unspecified: Secondary | ICD-10-CM | POA: Diagnosis not present

## 2019-01-15 DIAGNOSIS — C3492 Malignant neoplasm of unspecified part of left bronchus or lung: Secondary | ICD-10-CM | POA: Diagnosis not present

## 2019-01-15 DIAGNOSIS — Z79899 Other long term (current) drug therapy: Secondary | ICD-10-CM | POA: Diagnosis not present

## 2019-01-15 DIAGNOSIS — M329 Systemic lupus erythematosus, unspecified: Secondary | ICD-10-CM | POA: Diagnosis not present

## 2019-01-15 DIAGNOSIS — C3412 Malignant neoplasm of upper lobe, left bronchus or lung: Secondary | ICD-10-CM | POA: Diagnosis not present

## 2019-01-15 DIAGNOSIS — M0579 Rheumatoid arthritis with rheumatoid factor of multiple sites without organ or systems involvement: Secondary | ICD-10-CM | POA: Diagnosis not present

## 2019-01-15 DIAGNOSIS — Z7901 Long term (current) use of anticoagulants: Secondary | ICD-10-CM | POA: Diagnosis not present

## 2019-01-15 DIAGNOSIS — Z5112 Encounter for antineoplastic immunotherapy: Secondary | ICD-10-CM | POA: Diagnosis not present

## 2019-01-15 DIAGNOSIS — M069 Rheumatoid arthritis, unspecified: Secondary | ICD-10-CM | POA: Diagnosis not present

## 2019-01-15 DIAGNOSIS — C782 Secondary malignant neoplasm of pleura: Secondary | ICD-10-CM | POA: Diagnosis not present

## 2019-01-15 DIAGNOSIS — Z720 Tobacco use: Secondary | ICD-10-CM

## 2019-01-15 DIAGNOSIS — I10 Essential (primary) hypertension: Secondary | ICD-10-CM | POA: Diagnosis not present

## 2019-01-15 DIAGNOSIS — Z5111 Encounter for antineoplastic chemotherapy: Secondary | ICD-10-CM | POA: Diagnosis not present

## 2019-01-15 DIAGNOSIS — C778 Secondary and unspecified malignant neoplasm of lymph nodes of multiple regions: Secondary | ICD-10-CM | POA: Diagnosis not present

## 2019-01-15 DIAGNOSIS — J910 Malignant pleural effusion: Secondary | ICD-10-CM | POA: Diagnosis not present

## 2019-01-15 DIAGNOSIS — Z95828 Presence of other vascular implants and grafts: Secondary | ICD-10-CM

## 2019-01-15 DIAGNOSIS — M810 Age-related osteoporosis without current pathological fracture: Secondary | ICD-10-CM | POA: Diagnosis not present

## 2019-01-15 DIAGNOSIS — Z8719 Personal history of other diseases of the digestive system: Secondary | ICD-10-CM | POA: Diagnosis not present

## 2019-01-15 DIAGNOSIS — R0602 Shortness of breath: Secondary | ICD-10-CM | POA: Diagnosis not present

## 2019-01-15 DIAGNOSIS — K219 Gastro-esophageal reflux disease without esophagitis: Secondary | ICD-10-CM

## 2019-01-15 LAB — CBC WITH DIFFERENTIAL (CANCER CENTER ONLY)
Abs Immature Granulocytes: 0.04 10*3/uL (ref 0.00–0.07)
Basophils Absolute: 0.1 10*3/uL (ref 0.0–0.1)
Basophils Relative: 1 %
Eosinophils Absolute: 0 10*3/uL (ref 0.0–0.5)
Eosinophils Relative: 0 %
HCT: 29.4 % — ABNORMAL LOW (ref 36.0–46.0)
Hemoglobin: 9.6 g/dL — ABNORMAL LOW (ref 12.0–15.0)
Immature Granulocytes: 0 %
Lymphocytes Relative: 24 %
Lymphs Abs: 2.2 10*3/uL (ref 0.7–4.0)
MCH: 30.6 pg (ref 26.0–34.0)
MCHC: 32.7 g/dL (ref 30.0–36.0)
MCV: 93.6 fL (ref 80.0–100.0)
Monocytes Absolute: 0.2 10*3/uL (ref 0.1–1.0)
Monocytes Relative: 2 %
Neutro Abs: 6.5 10*3/uL (ref 1.7–7.7)
Neutrophils Relative %: 73 %
Platelet Count: 335 10*3/uL (ref 150–400)
RBC: 3.14 MIL/uL — ABNORMAL LOW (ref 3.87–5.11)
RDW: 19.1 % — ABNORMAL HIGH (ref 11.5–15.5)
WBC Count: 9.1 10*3/uL (ref 4.0–10.5)
nRBC: 0 % (ref 0.0–0.2)

## 2019-01-15 LAB — CMP (CANCER CENTER ONLY)
ALT: 21 U/L (ref 0–44)
AST: 28 U/L (ref 15–41)
Albumin: 2 g/dL — ABNORMAL LOW (ref 3.5–5.0)
Alkaline Phosphatase: 98 U/L (ref 38–126)
Anion gap: 9 (ref 5–15)
BUN: 14 mg/dL (ref 8–23)
CO2: 28 mmol/L (ref 22–32)
Calcium: 8.7 mg/dL — ABNORMAL LOW (ref 8.9–10.3)
Chloride: 94 mmol/L — ABNORMAL LOW (ref 98–111)
Creatinine: 0.61 mg/dL (ref 0.44–1.00)
GFR, Est AFR Am: >60 mL/min (ref >60)
GFR, Estimated: >60 mL/min (ref >60)
Glucose, Bld: 90 mg/dL (ref 70–99)
Potassium: 4.3 mmol/L (ref 3.5–5.1)
Sodium: 131 mmol/L — ABNORMAL LOW (ref 135–145)
Total Bilirubin: 0.5 mg/dL (ref 0.3–1.2)
Total Protein: 6.8 g/dL (ref 6.5–8.1)

## 2019-01-15 MED ORDER — SODIUM CHLORIDE 0.9% FLUSH
10.0000 mL | INTRAVENOUS | Status: DC | PRN
  Administered 2019-01-15: 10 mL
  Filled 2019-01-15: qty 10

## 2019-01-15 MED ORDER — HEPARIN SOD (PORK) LOCK FLUSH 100 UNIT/ML IV SOLN
500.0000 [IU] | Freq: Once | INTRAVENOUS | Status: AC | PRN
  Administered 2019-01-15: 500 [IU]
  Filled 2019-01-15: qty 5

## 2019-01-15 NOTE — Progress Notes (Signed)
Donna Mcneil  MRN: [id number] DOB: July 15, 1941  Subjective:  HPI   The patient is a 78 year old female who presents today for follow up.  She was seen last week and did not have any of her medicine with her.  She presents today with her son and with all medicines.   Her medicine list has been updated to reflect what she is currently taking.  Patient Active Problem List   Diagnosis Date Noted  . Leukocytosis   . Pressure injury of skin 11/20/2018  . Tachycardia   . Acute on chronic diastolic CHF (congestive heart failure) ([place])   . Atrial fibrillation with RVR ([place])   . Acute respiratory failure with hypoxia ([place]) 11/19/2018  . Port-A-Cath in place 10/24/2018  . Adenocarcinoma of left lung, stage 4 ([place]) 09/26/2018  . Goals of care, counseling/discussion 09/26/2018  . Encounter for antineoplastic chemotherapy 09/26/2018  . Community acquired pneumonia of left lower lobe of lung ([place])   . Mass of upper lobe of left lung 09/02/2018  . COPD (chronic obstructive pulmonary disease) ([place]) 09/01/2018  . Chronic respiratory failure with hypoxia ([place]) 09/01/2018  . Hyponatremia 09/01/2018  . 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  . 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 Rheumatoid arthritis involving multiple sites with positive rheumatoid factor ([place]) 12/12/2017  . Tobacco use 04/14/2017  . Carotid stenosis 04/14/2017  . Peripheral neuropathy 06/24/2016  . 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 Chronic bronchitis ([place]) 11/13/2015  . Osteoarthritis 10/05/2015  . Hyperlipidemia 10/05/2015  . Hypertension 10/05/2015  . Osteoporosis 10/05/2015  . Acid reflux 10/05/2015  . Peptic ulcer 10/05/2015  . Cataract 10/05/2015  . Tendinitis 10/05/2015  . Osteopenia 02/11/2014  . Seropositive rheumatoid arthritis ([place]) 02/11/2014    Past Medical History:  Diagnosis Date  . COPD exacerbation ([place]) 09/01/2018  . COVID-19 virus infection 11/28/2018  . Diverticulitis   .  Hypercholesteremia   . Hypertension   . Lupus ([place])   . Osteoporosis   . Tobacco use     Social History   Socioeconomic History  . Marital status: Divorced    Spouse name: Not on file  . Number of children: 2  . Years of education: Not on file  . Highest education level: High school graduate  Occupational History  . Occupation: retired  Scientific laboratory technician  . Financial resource strain: Not hard at all  . Food insecurity:    Worry: Never true    Inability: Never true  . Transportation needs:    Medical: No    Non-medical: No  Tobacco Use  . Smoking status: Former Smoker    Packs/day: 0.50    Types: Cigarettes    Last attempt to quit: 09/01/2018    Years since quitting: 0.3  . Smokeless tobacco: Never Used  . Tobacco comment: started smoking at age 17  Substance and Sexual Activity  . Alcohol use: No    Alcohol/week: 0.0 standard drinks  . Drug use: No  . Sexual activity: Not Currently  Lifestyle  . Physical activity:    Days per week: Not on file    Minutes per session: Not on file  . Stress: To some extent  Relationships  . Social connections:    Talks on phone: Not on file    Gets together: Not on file    Attends religious service: Not on file    Active member of club or organization: Not on file    Attends meetings of clubs or organizations: Not on  file    Relationship status: Not on file  . Intimate partner violence:    Fear of current or ex partner: Not on file    Emotionally abused: Not on file    Physically abused: Not on file    Forced sexual activity: Not on file  Other Topics Concern  . Not on file  Social History Narrative  . Not on file    Outpatient Encounter Medications as of 01/15/2019  Medication Sig  . acetaminophen (TYLENOL) 500 MG tablet Take 1,000 mg by mouth every 6 (six) hours as needed for mild pain.   Marland Kitchen albuterol (PROVENTIL) (2.5 MG/3ML) 0.083% nebulizer solution Take 3 mLs (2.5 mg total) by nebulization every 4 (four) hours as needed for  wheezing or shortness of breath.  . ALPRAZolam (XANAX) 1 MG tablet Take 1 tablet (1 mg total) by mouth 2 (two) times daily as needed for anxiety.  Marland Kitchen amLODipine (NORVASC) 10 MG tablet Take 10 mg by mouth daily.  Marland Kitchen aspirin EC 81 MG tablet Take 81 mg by mouth daily.  Marland Kitchen atorvastatin (LIPITOR) 40 MG tablet TAKE 1 TABLET BY MOUTH ONCE DAILY (Patient taking differently: Take 40 mg by mouth every evening. )  . b complex vitamins tablet Take 2 tablets by mouth 2 (two) times daily.  Marland Kitchen bismuth subsalicylate (PEPTO BISMOL) 262 MG/15ML suspension Take 30 mLs by mouth every 6 (six) hours as needed.  . Calcium Carb-Cholecalciferol (CALCIUM 600+D) 600-800 MG-UNIT TABS Take 1 tablet by mouth 2 (two) times a day.  . carboxymethylcellulose (REFRESH TEARS) 0.5 % SOLN Place 1 drop into both eyes daily as needed (dry eyes).   . cetirizine (ZYRTEC) 10 MG tablet Take 10 mg by mouth daily as needed for allergies.  . Coenzyme Q10 (COQ10) 100 MG CAPS Take 100 mg by mouth daily.  Marland Kitchen dextromethorphan-guaiFENesin (MUCINEX DM) 30-600 MG 12hr tablet Take 1 tablet by mouth 2 (two) times daily.  Marland Kitchen docusate sodium (COLACE) 100 MG capsule Take 100 mg by mouth 2 (two) times daily.  . famotidine (PEPCID) 10 MG tablet Take 10 mg by mouth 2 (two) times daily.  . Ferrous Sulfate (SLOW FE) 142 (45 Fe) MG TBCR Take 1 tablet by mouth daily.  . fluticasone (FLONASE) 50 MCG/ACT nasal spray Place 2 sprays into both nostrils daily.  . folic acid (FOLVITE) 1 MG tablet Take 1 tablet (1 mg total) by mouth daily.  . hydrochlorothiazide (HYDRODIURIL) 50 MG tablet Take 50 mg by mouth daily.  Javier Docker Oil 500 MG CAPS Take 500 mg by mouth daily.  Marland Kitchen loperamide (LOPERAMIDE A-D) 2 MG tablet Take 2 mg by mouth daily as needed for diarrhea or loose stools.  . methotrexate (RHEUMATREX) 2.5 MG tablet Take 6 tablets (15 mg total) by mouth every Monday. Hold it for one week and talk to your PCP before resuming it.  . Multiple Vitamins-Minerals (MULTIVITAMIN  ADULT PO) Take 1 tablet by mouth daily.   . Omega-3 Fatty Acids (FISH OIL) 1200 MG CAPS Take 1 capsule by mouth 2 (two) times a day.  Marland Kitchen PROAIR HFA 108 (90 Base) MCG/ACT inhaler Inhale 2 puffs into the lungs every 6 (six) hours as needed for wheezing or shortness of breath.  . Simethicone (GAS RELIEF 80 PO) Take by mouth.  . umeclidinium-vilanterol (ANORO ELLIPTA) 62.5-25 MCG/INH AEPB Inhale 1 puff into the lungs daily.  . Vitamin D, Cholecalciferol, 25 MCG (1000 UT) CAPS Take 1 capsule by mouth daily.  Marland Kitchen apixaban (ELIQUIS) 2.5 MG TABS tablet  Take 1 tablet (2.5 mg total) by mouth 2 (two) times daily. (Patient not taking: Reported on 01/15/2019)  . bisacodyl (DULCOLAX) 10 MG suppository Place 10 mg rectally as needed for moderate constipation.  . feeding supplement, ENSURE ENLIVE, (ENSURE ENLIVE) LIQD Take 237 mLs by mouth 2 (two) times daily between meals. (Patient not taking: Reported on 01/15/2019)  . flecainide (TAMBOCOR) 50 MG tablet Take 1 tablet (50 mg total) by mouth every 12 (twelve) hours. (Patient not taking: Reported on 01/15/2019)  . furosemide (LASIX) 20 MG tablet Take 1 tablet (20 mg total) by mouth 2 (two) times daily. (Patient not taking: Reported on 01/15/2019)  . gabapentin (NEURONTIN) 100 MG capsule Take 100 mg by mouth 3 (three) times daily.   Marland Kitchen HYDROcodone-Acetaminophen (VICODIN) 5-300 MG TABS Take 1 tablet by mouth every 6 (six) hours as needed (severe pain).  . hydroxychloroquine (PLAQUENIL) 200 MG tablet Take 1 tablet by mouth daily.  Marland Kitchen ipratropium (ATROVENT HFA) 17 MCG/ACT inhaler Inhale 2 puffs into the lungs 3 (three) times daily. (Patient not taking: Reported on 01/15/2019)  . lidocaine-prilocaine (EMLA) cream Apply 1 application topically as needed. (Patient not taking: Reported on 01/15/2019)  . Melatonin 3 MG TABS Take 3 mg by mouth at bedtime.  . metoprolol tartrate (LOPRESSOR) 100 MG tablet Take 1 tablet (100 mg total) by mouth 2 (two) times daily. (Patient not taking:  Reported on 01/15/2019)  . pantoprazole (PROTONIX) 40 MG tablet Take 1 tablet (40 mg total) by mouth daily at 6 (six) AM. (Patient not taking: Reported on 01/15/2019)  . polyethylene glycol (MIRALAX / GLYCOLAX) packet Take 17 g by mouth daily as needed for mild constipation. (Patient not taking: Reported on 01/15/2019)  . polyvinyl alcohol (LIQUIFILM TEARS) 1.4 % ophthalmic solution Place 1 drop into both eyes daily as needed for dry eyes. (Patient not taking: Reported on 01/15/2019)  . potassium chloride 20 MEQ TBCR Take 20 mEq by mouth daily. (Patient not taking: Reported on 01/15/2019)  . potassium chloride SA (K-DUR,KLOR-CON) 20 MEQ tablet Take 1 tablet (20 mEq total) by mouth daily. (Patient not taking: Reported on 01/15/2019)  . Probiotic Product (PROBIOTIC DAILY) CAPS Take 1 capsule by mouth 2 (two) times daily.  . prochlorperazine (COMPAZINE) 10 MG tablet Take 1 tablet (10 mg total) by mouth every 6 (six) hours as needed for nausea or vomiting. (Patient not taking: Reported on 01/15/2019)  . rivaroxaban (XARELTO) 10 MG TABS tablet Take 10 mg by mouth daily. For A-fib  . [DISCONTINUED] Calcium Carbonate-Vitamin D (CALCIUM 600+D) 600-200 MG-UNIT TABS Take 1 tablet by mouth 2 (two) times daily.   . [DISCONTINUED] guaiFENesin (ROBITUSSIN) 100 MG/5ML liquid Take 200 mg by mouth 3 (three) times daily as needed for cough.  . [DISCONTINUED] loratadine (CLARITIN) 10 MG tablet Take 1 tablet (10 mg total) by mouth daily.  . [DISCONTINUED] Omega 3 1000 MG CAPS Take 1,000-2,000 mg by mouth See admin instructions. Take 2000 mg in the morning and 1000 mg at night  . [DISCONTINUED] sodium chloride flush (NS) 0.9 % injection 10 mL    No facility-administered encounter medications on file as of 01/15/2019.     Allergies  Allergen Reactions  . Codeine Other (See Comments)    GI upset  . Cyclosporine Other (See Comments)    Burning eyes  . Other     Other reaction(s): OTHER  . Pregabalin Other (See Comments)     Personality changes-becomes angry  . Zolpidem     Other reaction(s): ITCHING  Review of Systems  Constitutional: Positive for malaise/fatigue.  Respiratory: Positive for shortness of breath.   Gastrointestinal: Positive for diarrhea.    Objective:  BP 124/62 (BP Location: Right Arm, Patient Position: Sitting, Cuff Size: Normal)   Pulse (!) 106   Temp 98 F (36.7 C) (Oral)   SpO2 99%   Physical Exam  Constitutional: She is oriented to person, place, and time. She appears distressed.  Very thin - cachetic.  HENT:  Head: Normocephalic and atraumatic.  Eyes: Pupils are equal, round, and reactive to light. Conjunctivae are normal.  Neck: Neck supple.  Cardiovascular:  Tachycardia with irregular rhythm.  Pulmonary/Chest: She is in respiratory distress.  Shortness of breath with some rhonchi that clears with cough. On oxygen 24 hrs a day by nasal cannula at 3-4 LPM.  Abdominal: Soft. Bowel sounds are normal.  Musculoskeletal: Normal range of motion.  Neurological: She is alert and oriented to person, place, and time.  Skin: There is pallor.  Psychiatric: Affect and judgment normal.    Assessment and Plan :  1. Adenocarcinoma of left lung, stage 4 (HCC) Presents with son that lives in [place]. He helped get list of medications in order to document those used daily and those used PRN. Presently under treatment for recently diagnosed stage IV (T1b, N2, M1a) non-small cell lung cancer highly suspicious for adenocarcinoma presenteing with left upper lobe lung nodule in addition to left hilar and mediastinal lymphadenopathy as well as malignant right pleural effusion and pleural based metastasis diagnosed in January 2020. Treated with carboplatin, Alimta and Avastin for 3 cycles (has port in left upper chest) by Dr. Earlie Server (oncologist)..  2. Chronic respiratory failure with hypoxia (HCC) Followed by Dr. Lake Bells (pulmonologist) and on oxygen at 4 LPM 24 hours a day. Has  Albuterol to use by nebulizer at home and carries Albuterol-HFA for prn use. Takes Anoro 1 puff qd.  3. Atrial fibrillation with RVR (HCC) Presently on Xarelto 10 mg qd with ASA-EC 81 mg qd and not taking the Eliquis. Follow up with Dr. Acie Fredrickson (cardiologist) difficult with COVID-19 restrictions. History of acute on chronic diastolic CHF and uses Lasix with Potassium prn peripheral edema. No chest pains or significant edema today.  4. Gastroesophageal reflux disease, esophagitis presence not specified Dyspepsia well controlled with use of Pantoprazole daily. No hematemesis, melena or hematochezia.  5. Rheumatoid arthritis involving multiple sites with positive rheumatoid factor (HCC) Followed by Dr. Jefm Bryant (rheumatologist) and feels joint pains controlled by Methotrexate 15 mg every Monday, Tylenol 500 mg 2 tablets q 6 hrs prn and Hydroxychloroquine 200 mg qd.   6. Tobacco use Finally quit 09-11-18 after smoking at least a ppd starting at age 25.

## 2019-01-22 ENCOUNTER — Ambulatory Visit (HOSPITAL_COMMUNITY)
Admission: RE | Admit: 2019-01-22 | Discharge: 2019-01-22 | Disposition: A | Discharge status: Home / Self Care | Payer: Medicare Other | Source: Ambulatory Visit | Attending: Internal Medicine | Admitting: Internal Medicine

## 2019-01-22 ENCOUNTER — Inpatient Hospital Stay: Payer: Medicare Other

## 2019-01-22 ENCOUNTER — Other Ambulatory Visit: Payer: Self-pay

## 2019-01-22 ENCOUNTER — Encounter: Payer: Self-pay | Admitting: Family Medicine

## 2019-01-22 DIAGNOSIS — Z8619 Personal history of other infectious and parasitic diseases: Secondary | ICD-10-CM | POA: Diagnosis not present

## 2019-01-22 DIAGNOSIS — C3492 Malignant neoplasm of unspecified part of left bronchus or lung: Secondary | ICD-10-CM

## 2019-01-22 DIAGNOSIS — Z7901 Long term (current) use of anticoagulants: Secondary | ICD-10-CM | POA: Diagnosis not present

## 2019-01-22 DIAGNOSIS — C787 Secondary malignant neoplasm of liver and intrahepatic bile duct: Secondary | ICD-10-CM | POA: Diagnosis not present

## 2019-01-22 DIAGNOSIS — C3412 Malignant neoplasm of upper lobe, left bronchus or lung: Secondary | ICD-10-CM | POA: Diagnosis not present

## 2019-01-22 DIAGNOSIS — I10 Essential (primary) hypertension: Secondary | ICD-10-CM | POA: Diagnosis not present

## 2019-01-22 DIAGNOSIS — Z95828 Presence of other vascular implants and grafts: Secondary | ICD-10-CM

## 2019-01-22 DIAGNOSIS — J910 Malignant pleural effusion: Secondary | ICD-10-CM | POA: Diagnosis not present

## 2019-01-22 DIAGNOSIS — M069 Rheumatoid arthritis, unspecified: Secondary | ICD-10-CM | POA: Diagnosis not present

## 2019-01-22 DIAGNOSIS — Z5112 Encounter for antineoplastic immunotherapy: Secondary | ICD-10-CM | POA: Diagnosis not present

## 2019-01-22 DIAGNOSIS — Z8719 Personal history of other diseases of the digestive system: Secondary | ICD-10-CM | POA: Diagnosis not present

## 2019-01-22 DIAGNOSIS — Z5111 Encounter for antineoplastic chemotherapy: Secondary | ICD-10-CM | POA: Diagnosis not present

## 2019-01-22 DIAGNOSIS — J449 Chronic obstructive pulmonary disease, unspecified: Secondary | ICD-10-CM | POA: Diagnosis not present

## 2019-01-22 DIAGNOSIS — C3490 Malignant neoplasm of unspecified part of unspecified bronchus or lung: Secondary | ICD-10-CM | POA: Insufficient documentation

## 2019-01-22 DIAGNOSIS — Z79899 Other long term (current) drug therapy: Secondary | ICD-10-CM | POA: Diagnosis not present

## 2019-01-22 DIAGNOSIS — C782 Secondary malignant neoplasm of pleura: Secondary | ICD-10-CM | POA: Diagnosis not present

## 2019-01-22 DIAGNOSIS — M329 Systemic lupus erythematosus, unspecified: Secondary | ICD-10-CM | POA: Diagnosis not present

## 2019-01-22 DIAGNOSIS — R0602 Shortness of breath: Secondary | ICD-10-CM | POA: Diagnosis not present

## 2019-01-22 DIAGNOSIS — C778 Secondary and unspecified malignant neoplasm of lymph nodes of multiple regions: Secondary | ICD-10-CM | POA: Diagnosis not present

## 2019-01-22 DIAGNOSIS — M810 Age-related osteoporosis without current pathological fracture: Secondary | ICD-10-CM | POA: Diagnosis not present

## 2019-01-22 LAB — CBC WITH DIFFERENTIAL (CANCER CENTER ONLY)
Abs Immature Granulocytes: 0.02 10*3/uL (ref 0.00–0.07)
Basophils Absolute: 0.1 10*3/uL (ref 0.0–0.1)
Basophils Relative: 1 %
Eosinophils Absolute: 0 10*3/uL (ref 0.0–0.5)
Eosinophils Relative: 1 %
HCT: 26.4 % — ABNORMAL LOW (ref 36.0–46.0)
Hemoglobin: 8.9 g/dL — ABNORMAL LOW (ref 12.0–15.0)
Immature Granulocytes: 0 %
Lymphocytes Relative: 32 %
Lymphs Abs: 2 10*3/uL (ref 0.7–4.0)
MCH: 31.8 pg (ref 26.0–34.0)
MCHC: 33.7 g/dL (ref 30.0–36.0)
MCV: 94.3 fL (ref 80.0–100.0)
Monocytes Absolute: 0.6 10*3/uL (ref 0.1–1.0)
Monocytes Relative: 10 %
Neutro Abs: 3.5 10*3/uL (ref 1.7–7.7)
Neutrophils Relative %: 56 %
Platelet Count: 128 10*3/uL — ABNORMAL LOW (ref 150–400)
RBC: 2.8 MIL/uL — ABNORMAL LOW (ref 3.87–5.11)
RDW: 18.4 % — ABNORMAL HIGH (ref 11.5–15.5)
WBC Count: 6.2 10*3/uL (ref 4.0–10.5)
nRBC: 0 % (ref 0.0–0.2)

## 2019-01-22 LAB — CMP (CANCER CENTER ONLY)
ALT: 29 U/L (ref 0–44)
AST: 32 U/L (ref 15–41)
Albumin: 2.4 g/dL — ABNORMAL LOW (ref 3.5–5.0)
Alkaline Phosphatase: 97 U/L (ref 38–126)
Anion gap: 11 (ref 5–15)
BUN: 9 mg/dL (ref 8–23)
CO2: 29 mmol/L (ref 22–32)
Calcium: 8.9 mg/dL (ref 8.9–10.3)
Chloride: 92 mmol/L — ABNORMAL LOW (ref 98–111)
Creatinine: 0.57 mg/dL (ref 0.44–1.00)
GFR, Est AFR Am: >60 mL/min (ref >60)
GFR, Estimated: >60 mL/min (ref >60)
Glucose, Bld: 87 mg/dL (ref 70–99)
Potassium: 3.1 mmol/L — ABNORMAL LOW (ref 3.5–5.1)
Sodium: 132 mmol/L — ABNORMAL LOW (ref 135–145)
Total Bilirubin: 0.2 mg/dL — ABNORMAL LOW (ref 0.3–1.2)
Total Protein: 6.8 g/dL (ref 6.5–8.1)

## 2019-01-22 MED ORDER — SODIUM CHLORIDE (PF) 0.9 % IJ SOLN
INTRAMUSCULAR | Status: AC
  Filled 2019-01-22: qty 50

## 2019-01-22 MED ORDER — IOHEXOL 300 MG/ML  SOLN
75.0000 mL | Freq: Once | INTRAMUSCULAR | Status: AC | PRN
  Administered 2019-01-22: 15:00:00 75 mL via INTRAVENOUS

## 2019-01-22 MED ORDER — HEPARIN SOD (PORK) LOCK FLUSH 100 UNIT/ML IV SOLN
INTRAVENOUS | Status: AC
  Filled 2019-01-22: qty 5

## 2019-01-22 MED ORDER — SODIUM CHLORIDE 0.9% FLUSH
10.0000 mL | INTRAVENOUS | Status: DC | PRN
  Administered 2019-01-22: 10 mL
  Filled 2019-01-22: qty 10

## 2019-01-22 MED ORDER — HEPARIN SOD (PORK) LOCK FLUSH 100 UNIT/ML IV SOLN
500.0000 [IU] | Freq: Once | INTRAVENOUS | Status: AC
  Administered 2019-01-22: 15:00:00 500 [IU] via INTRAVENOUS

## 2019-01-24 ENCOUNTER — Other Ambulatory Visit: Payer: Self-pay | Admitting: Medical Oncology

## 2019-01-24 ENCOUNTER — Telehealth: Payer: Self-pay | Admitting: Medical Oncology

## 2019-01-24 DIAGNOSIS — R110 Nausea: Secondary | ICD-10-CM

## 2019-01-24 MED ORDER — PROCHLORPERAZINE MALEATE 10 MG PO TABS: 10.0000 mg | ORAL_TABLET | Freq: Four times a day (QID) | ORAL | 1 refills | Status: AC | PRN

## 2019-01-24 NOTE — Telephone Encounter (Signed)
Nauseated all day - out of compazine. Refill sent

## 2019-01-25 ENCOUNTER — Telehealth: Payer: Self-pay

## 2019-01-25 NOTE — Telephone Encounter (Signed)
Patient states that she was in rehab in May and the nurse practitioner changed her Xanax  to 1 mg BID. She would like to know should she stay on this dosage or go back on the original dosage that you placed her on.

## 2019-01-25 NOTE — Telephone Encounter (Signed)
Prefer she be on the lower dose of 0.5 mg BID only if needed. Can use up the 1 mg prescription by breaking them in half.

## 2019-01-25 NOTE — Telephone Encounter (Signed)
Patient has been advised. KW 

## 2019-01-29 ENCOUNTER — Telehealth: Payer: Self-pay | Admitting: Medical Oncology

## 2019-01-29 ENCOUNTER — Inpatient Hospital Stay: Payer: Medicare Other

## 2019-01-29 ENCOUNTER — Other Ambulatory Visit: Payer: Self-pay

## 2019-01-29 ENCOUNTER — Encounter: Payer: Self-pay | Admitting: Internal Medicine

## 2019-01-29 ENCOUNTER — Telehealth: Payer: Self-pay | Admitting: Family Medicine

## 2019-01-29 ENCOUNTER — Telehealth: Payer: Self-pay | Admitting: *Deleted

## 2019-01-29 ENCOUNTER — Inpatient Hospital Stay: Payer: Medicare Other | Attending: Internal Medicine

## 2019-01-29 ENCOUNTER — Inpatient Hospital Stay (HOSPITAL_BASED_OUTPATIENT_CLINIC_OR_DEPARTMENT_OTHER): Payer: Medicare Other | Admitting: Internal Medicine

## 2019-01-29 VITALS — BP 131/85 | HR 95 | Temp 98.2°F | Resp 18 | Ht 62.0 in | Wt 116.0 lb

## 2019-01-29 DIAGNOSIS — T451X5A Adverse effect of antineoplastic and immunosuppressive drugs, initial encounter: Secondary | ICD-10-CM | POA: Diagnosis not present

## 2019-01-29 DIAGNOSIS — I110 Hypertensive heart disease with heart failure: Secondary | ICD-10-CM | POA: Diagnosis not present

## 2019-01-29 DIAGNOSIS — C3412 Malignant neoplasm of upper lobe, left bronchus or lung: Secondary | ICD-10-CM | POA: Diagnosis not present

## 2019-01-29 DIAGNOSIS — M810 Age-related osteoporosis without current pathological fracture: Secondary | ICD-10-CM | POA: Diagnosis not present

## 2019-01-29 DIAGNOSIS — D6481 Anemia due to antineoplastic chemotherapy: Secondary | ICD-10-CM

## 2019-01-29 DIAGNOSIS — Z79899 Other long term (current) drug therapy: Secondary | ICD-10-CM

## 2019-01-29 DIAGNOSIS — F17210 Nicotine dependence, cigarettes, uncomplicated: Secondary | ICD-10-CM | POA: Insufficient documentation

## 2019-01-29 DIAGNOSIS — Z7982 Long term (current) use of aspirin: Secondary | ICD-10-CM

## 2019-01-29 DIAGNOSIS — J441 Chronic obstructive pulmonary disease with (acute) exacerbation: Secondary | ICD-10-CM

## 2019-01-29 DIAGNOSIS — I700 Atherosclerosis of aorta: Secondary | ICD-10-CM | POA: Insufficient documentation

## 2019-01-29 DIAGNOSIS — C3492 Malignant neoplasm of unspecified part of left bronchus or lung: Secondary | ICD-10-CM

## 2019-01-29 DIAGNOSIS — R0602 Shortness of breath: Secondary | ICD-10-CM | POA: Insufficient documentation

## 2019-01-29 DIAGNOSIS — C782 Secondary malignant neoplasm of pleura: Secondary | ICD-10-CM | POA: Diagnosis not present

## 2019-01-29 DIAGNOSIS — K5900 Constipation, unspecified: Secondary | ICD-10-CM | POA: Insufficient documentation

## 2019-01-29 DIAGNOSIS — E875 Hyperkalemia: Secondary | ICD-10-CM | POA: Diagnosis not present

## 2019-01-29 DIAGNOSIS — C787 Secondary malignant neoplasm of liver and intrahepatic bile duct: Secondary | ICD-10-CM

## 2019-01-29 DIAGNOSIS — Z5111 Encounter for antineoplastic chemotherapy: Secondary | ICD-10-CM

## 2019-01-29 DIAGNOSIS — U071 COVID-19: Secondary | ICD-10-CM | POA: Diagnosis not present

## 2019-01-29 DIAGNOSIS — E7800 Pure hypercholesterolemia, unspecified: Secondary | ICD-10-CM

## 2019-01-29 DIAGNOSIS — Z7901 Long term (current) use of anticoagulants: Secondary | ICD-10-CM

## 2019-01-29 DIAGNOSIS — K121 Other forms of stomatitis: Secondary | ICD-10-CM | POA: Insufficient documentation

## 2019-01-29 DIAGNOSIS — L989 Disorder of the skin and subcutaneous tissue, unspecified: Secondary | ICD-10-CM | POA: Insufficient documentation

## 2019-01-29 DIAGNOSIS — Z7189 Other specified counseling: Secondary | ICD-10-CM

## 2019-01-29 DIAGNOSIS — M069 Rheumatoid arthritis, unspecified: Secondary | ICD-10-CM | POA: Diagnosis not present

## 2019-01-29 DIAGNOSIS — Z95828 Presence of other vascular implants and grafts: Secondary | ICD-10-CM

## 2019-01-29 DIAGNOSIS — M329 Systemic lupus erythematosus, unspecified: Secondary | ICD-10-CM | POA: Diagnosis not present

## 2019-01-29 DIAGNOSIS — I10 Essential (primary) hypertension: Secondary | ICD-10-CM

## 2019-01-29 DIAGNOSIS — J449 Chronic obstructive pulmonary disease, unspecified: Secondary | ICD-10-CM

## 2019-01-29 DIAGNOSIS — I4891 Unspecified atrial fibrillation: Secondary | ICD-10-CM | POA: Diagnosis not present

## 2019-01-29 LAB — CBC WITH DIFFERENTIAL (CANCER CENTER ONLY)
Abs Immature Granulocytes: 0.05 10*3/uL (ref 0.00–0.07)
Basophils Absolute: 0.1 10*3/uL (ref 0.0–0.1)
Basophils Relative: 1 %
Eosinophils Absolute: 0.1 10*3/uL (ref 0.0–0.5)
Eosinophils Relative: 2 %
HCT: 26.4 % — ABNORMAL LOW (ref 36.0–46.0)
Hemoglobin: 8.8 g/dL — ABNORMAL LOW (ref 12.0–15.0)
Immature Granulocytes: 1 %
Lymphocytes Relative: 30 %
Lymphs Abs: 2.1 10*3/uL (ref 0.7–4.0)
MCH: 31.8 pg (ref 26.0–34.0)
MCHC: 33.3 g/dL (ref 30.0–36.0)
MCV: 95.3 fL (ref 80.0–100.0)
Monocytes Absolute: 1.2 10*3/uL — ABNORMAL HIGH (ref 0.1–1.0)
Monocytes Relative: 17 %
Neutro Abs: 3.6 10*3/uL (ref 1.7–7.7)
Neutrophils Relative %: 49 %
Platelet Count: 365 10*3/uL (ref 150–400)
RBC: 2.77 MIL/uL — ABNORMAL LOW (ref 3.87–5.11)
RDW: 20.4 % — ABNORMAL HIGH (ref 11.5–15.5)
WBC Count: 7.1 10*3/uL (ref 4.0–10.5)
nRBC: 0 % (ref 0.0–0.2)

## 2019-01-29 LAB — CMP (CANCER CENTER ONLY)
ALT: 87 U/L — ABNORMAL HIGH (ref 0–44)
AST: 54 U/L — ABNORMAL HIGH (ref 15–41)
Albumin: 2.2 g/dL — ABNORMAL LOW (ref 3.5–5.0)
Alkaline Phosphatase: 263 U/L — ABNORMAL HIGH (ref 38–126)
Anion gap: 10 (ref 5–15)
BUN: 10 mg/dL (ref 8–23)
CO2: 31 mmol/L (ref 22–32)
Calcium: 8.2 mg/dL — ABNORMAL LOW (ref 8.9–10.3)
Chloride: 91 mmol/L — ABNORMAL LOW (ref 98–111)
Creatinine: 0.62 mg/dL (ref 0.44–1.00)
GFR, Est AFR Am: >60 mL/min (ref >60)
GFR, Estimated: >60 mL/min (ref >60)
Glucose, Bld: 94 mg/dL (ref 70–99)
Potassium: 2.8 mmol/L — CL (ref 3.5–5.1)
Sodium: 132 mmol/L — ABNORMAL LOW (ref 135–145)
Total Bilirubin: 0.4 mg/dL (ref 0.3–1.2)
Total Protein: 6.7 g/dL (ref 6.5–8.1)

## 2019-01-29 MED ORDER — SODIUM CHLORIDE 0.9% FLUSH
10.0000 mL | Freq: Once | INTRAVENOUS | Status: AC
  Administered 2019-01-29: 10 mL via INTRAVENOUS
  Filled 2019-01-29: qty 10

## 2019-01-29 MED ORDER — HEPARIN SOD (PORK) LOCK FLUSH 100 UNIT/ML IV SOLN
500.0000 [IU] | Freq: Once | INTRAVENOUS | Status: AC
  Administered 2019-01-29: 12:00:00 500 [IU] via INTRAVENOUS
  Filled 2019-01-29: qty 5

## 2019-01-29 MED ORDER — SODIUM CHLORIDE 0.9% FLUSH
10.0000 mL | INTRAVENOUS | Status: DC | PRN
  Administered 2019-01-29: 10 mL
  Filled 2019-01-29: qty 10

## 2019-01-29 MED ORDER — POTASSIUM CHLORIDE ER 20 MEQ PO TBCR: 40.0000 meq | EXTENDED_RELEASE_TABLET | Freq: Every day | ORAL | 0 refills | Status: DC

## 2019-01-29 NOTE — Telephone Encounter (Signed)
I told Donna Mcneil to pick up [facility] rx today and that she did not get tx today and a new treatment will be ordered.

## 2019-01-29 NOTE — Telephone Encounter (Signed)
Received call report from [facility].  "Today's K+ = 2.8."  Secure Chat message sent with results.

## 2019-01-29 NOTE — Telephone Encounter (Signed)
Pt wants to discharge their services.  They have not been in the home for two weeks per patients request.  Medication mgmt was not done because pt would not comply.  CB#  [phone]  Thanks Con Memos

## 2019-01-29 NOTE — Progress Notes (Signed)
DISCONTINUE ON PATHWAY REGIMEN - Non-Small Cell Lung     A cycle is every 21 days:     Carboplatin      Pemetrexed      Bevacizumab-xxxx   **Always confirm dose/schedule in your pharmacy ordering system**  REASON: Disease Progression PRIOR TREATMENT: [id number]: Carboplatin AUC=5 + Pemetrexed 500 mg/m2 + Bevacizumab 15 mg/kg q21 Days x 4-6 Cycles TREATMENT RESPONSE: Progressive Disease (PD)    Patient Characteristics: Stage IV Metastatic, Nonsquamous, Second Line - Chemotherapy/Immunotherapy, PS = 0, 1, No Prior PD-1/PD-L1  Inhibitor or Prior PD-1/PD-L1 Inhibitor + Chemotherapy, and Not a Candidate for Immunotherapy AJCC T Category: T1b Current Disease Status: Distant Metastases AJCC N Category: N2 AJCC M Category: M1a AJCC 8 Stage Grouping: IVA Histology: Nonsquamous Cell ROS1 Rearrangement Status: Negative T790M Mutation Status: Not Applicable - EGFR Mutation Negative/Unknown Other Mutations/Biomarkers: No Other Actionable Mutations NTRK Gene Fusion Status: Negative PD-L1 Expression Status: PD-L1 Positive 1-49% (TPS) Chemotherapy/Immunotherapy LOT: Second Line Chemotherapy/Immunotherapy Molecular Targeted Therapy: Not Appropriate ALK Rearrangement Status: Negative EGFR Mutation Status: Negative/Wild Type BRAF V600E Mutation Status: Negative ECOG Performance Status: 1 Immunotherapy Candidate Status: Not a Candidate for Immunotherapy Prior Immunotherapy Status: No Prior PD-1/PD-L1 Inhibitor

## 2019-01-29 NOTE — Telephone Encounter (Signed)
Please review and advise.

## 2019-01-29 NOTE — Telephone Encounter (Signed)
I called the patient and reviewed the oncologist's note from today (Dr. Earlie Server). CT scan on 01-22-19 showed lung cancer unchanged despite chemotherapy and 2 new metastatic lesions found in the liver. She may not have been receptive to the visiting nurse due to this bad news. Recommended they continue to allow the Homehealth visits to help with monitoring her situation. They are trying to arrange more family going into her home to help with care. Would like for [facility] to continue to follow her, but, not sure if the oncologist will recommend Hospice at some point.

## 2019-01-29 NOTE — Progress Notes (Signed)
[place] [phone]   [phone]  OFFICE PROGRESS NOTE  [name], [place] [address]  DIAGNOSIS: Stage IV (T1b, N2, M1a) non-small cell lung cancer adenocarcinoma  presented with left upper lobe nodule in addition to enlarged left hilar and subcarinal lymphadenopathy as well as loculated malignant left pleural effusion and the pleural metastatic disease diagnosed in January 2020 The patient has a history of lupus as well as rheumatoid arthritis.  She is not a candidate for immunotherapy.  Biomarker Findings Tumor Mutational Burden - 18 Muts/Mb Microsatellite status - MS-Stable Genomic Findings For a complete list of the genes assayed, please refer to the Appendix. BRAF T241M NF1 W2075*, V1085f*3 BCORL1 RA004HT*97STAG2 splice site 27414+2L>TTERT promoter -124C>T 7 Disease relevant genes with no reportable alterations: ALK, EGFR, ERBB2, KRAS, MET, RET, ROS1   PDL1 expression 1%.   PRIOR THERAPY: Systemic chemotherapy with carboplatin for AUC of 5, Alimta 500 mg/M2 and a Avastin 15 mg/KG every 3 weeks.  First dose October 09, 2018.  Status post 4 cycles.  Last dose was given on 01/09/2019 discontinued secondary to disease progression.  CURRENT THERAPY: Systemic chemotherapy with gemcitabine 1000 mg/M2 on days 1 and 8 every 3 weeks.  First dose 02/05/2019.  INTERVAL HISTORY: Donna LAKINS89y.o. female returns to the clinic today for follow-up visit.  The patient is feeling fine today with no concerning complaints except for the baseline shortness of breath and she is currently on home oxygen.  She denied having any chest pain, cough or hemoptysis.  She denied having any fever or chills.  She has no nausea, vomiting, diarrhea or constipation.  She denied having any headache or visual changes.  She continues to have fatigue.  The patient tolerated the last cycle of her systemic chemotherapy fairly well.   She had repeat CT scan of the chest, abdomen and pelvis performed recently and she is here for evaluation and discussion of her scan results and treatment options.   MEDICAL HISTORY: Past Medical History:  Diagnosis Date   COPD exacerbation ([place] 09/01/2018   COVID-19 virus infection 11/28/2018   Diverticulitis    Hypercholesteremia    Hypertension    Lupus ([place]    Osteoporosis    Tobacco use     ALLERGIES:  is allergic to codeine; cyclosporine; other; pregabalin; and zolpidem.  MEDICATIONS:  Current Outpatient Medications  Medication Sig Dispense Refill   acetaminophen (TYLENOL) 500 MG tablet Take 1,000 mg by mouth every 6 (six) hours as needed for mild pain.      albuterol (PROVENTIL) (2.5 MG/3ML) 0.083% nebulizer solution Take 3 mLs (2.5 mg total) by nebulization every 4 (four) hours as needed for wheezing or shortness of breath. 180 mL 2   ALPRAZolam (XANAX) 1 MG tablet Take 1 tablet (1 mg total) by mouth 2 (two) times daily as needed for anxiety. 20 tablet 0   amLODipine (NORVASC) 10 MG tablet Take 10 mg by mouth daily.     apixaban (ELIQUIS) 2.5 MG TABS tablet Take 1 tablet (2.5 mg total) by mouth 2 (two) times daily. (Patient not taking: Reported on 01/15/2019) 60 tablet 0   aspirin EC 81 MG tablet Take 81 mg by mouth daily.     atorvastatin (LIPITOR) 40 MG tablet TAKE 1 TABLET BY MOUTH ONCE DAILY (Patient taking differently: Take 40 mg by mouth every evening. ) 90 tablet 3   b complex vitamins tablet Take 2  tablets by mouth 2 (two) times daily.     bisacodyl (DULCOLAX) 10 MG suppository Place 10 mg rectally as needed for moderate constipation.     bismuth subsalicylate (PEPTO BISMOL) 262 MG/15ML suspension Take 30 mLs by mouth every 6 (six) hours as needed.     Calcium Carb-Cholecalciferol (CALCIUM 600+D) 600-800 MG-UNIT TABS Take 1 tablet by mouth 2 (two) times a day.     carboxymethylcellulose (REFRESH TEARS) 0.5 % SOLN Place 1 drop into both eyes daily as  needed (dry eyes).      cetirizine (ZYRTEC) 10 MG tablet Take 10 mg by mouth daily as needed for allergies.     Coenzyme Q10 (COQ10) 100 MG CAPS Take 100 mg by mouth daily.     dextromethorphan-guaiFENesin (MUCINEX DM) 30-600 MG 12hr tablet Take 1 tablet by mouth 2 (two) times daily.     docusate sodium (COLACE) 100 MG capsule Take 100 mg by mouth 2 (two) times daily.     famotidine (PEPCID) 10 MG tablet Take 10 mg by mouth 2 (two) times daily.     feeding supplement, ENSURE ENLIVE, (ENSURE ENLIVE) LIQD Take 237 mLs by mouth 2 (two) times daily between meals. (Patient not taking: Reported on 01/15/2019) 237 mL 12   Ferrous Sulfate (SLOW FE) 142 (45 Fe) MG TBCR Take 1 tablet by mouth daily.     flecainide (TAMBOCOR) 50 MG tablet Take 1 tablet (50 mg total) by mouth every 12 (twelve) hours. (Patient not taking: Reported on 01/15/2019) 60 tablet 0   fluticasone (FLONASE) 50 MCG/ACT nasal spray Place 2 sprays into both nostrils daily. 16 g 2   folic acid (FOLVITE) 1 MG tablet Take 1 tablet (1 mg total) by mouth daily. 30 tablet 4   furosemide (LASIX) 20 MG tablet Take 1 tablet (20 mg total) by mouth 2 (two) times daily. (Patient not taking: Reported on 01/15/2019) 60 tablet 0   gabapentin (NEURONTIN) 100 MG capsule Take 100 mg by mouth 3 (three) times daily.      hydrochlorothiazide (HYDRODIURIL) 50 MG tablet Take 50 mg by mouth daily.     HYDROcodone-Acetaminophen (VICODIN) 5-300 MG TABS Take 1 tablet by mouth every 6 (six) hours as needed (severe pain).     hydroxychloroquine (PLAQUENIL) 200 MG tablet Take 1 tablet by mouth daily.     ipratropium (ATROVENT HFA) 17 MCG/ACT inhaler Inhale 2 puffs into the lungs 3 (three) times daily. (Patient not taking: Reported on 01/15/2019) 1 Inhaler 12   Krill Oil 500 MG CAPS Take 500 mg by mouth daily.     loperamide (LOPERAMIDE A-D) 2 MG tablet Take 2 mg by mouth daily as needed for diarrhea or loose stools.     Melatonin 3 MG TABS Take 3 mg by  mouth at bedtime.     methotrexate (RHEUMATREX) 2.5 MG tablet Take 6 tablets (15 mg total) by mouth every Monday. Hold it for one week and talk to your PCP before resuming it. 4 tablet 0   metoprolol tartrate (LOPRESSOR) 100 MG tablet Take 1 tablet (100 mg total) by mouth 2 (two) times daily. (Patient not taking: Reported on 01/15/2019) 60 tablet 0   Multiple Vitamins-Minerals (MULTIVITAMIN ADULT PO) Take 1 tablet by mouth daily.      Omega-3 Fatty Acids (FISH OIL) 1200 MG CAPS Take 1 capsule by mouth 2 (two) times a day.     pantoprazole (PROTONIX) 40 MG tablet Take 1 tablet (40 mg total) by mouth daily at 6 (six) AM. (Patient  not taking: Reported on 01/15/2019) 30 tablet 0   polyethylene glycol (MIRALAX / GLYCOLAX) packet Take 17 g by mouth daily as needed for mild constipation. (Patient not taking: Reported on 01/15/2019) 14 each 0   potassium chloride 20 MEQ TBCR Take 20 mEq by mouth daily. (Patient not taking: Reported on 01/15/2019) 7 tablet 0   potassium chloride SA (K-DUR,KLOR-CON) 20 MEQ tablet Take 1 tablet (20 mEq total) by mouth daily. (Patient not taking: Reported on 01/15/2019) 7 tablet 0   PROAIR HFA 108 (90 Base) MCG/ACT inhaler Inhale 2 puffs into the lungs every 6 (six) hours as needed for wheezing or shortness of breath. 3 Inhaler 2   Probiotic Product (PROBIOTIC DAILY) CAPS Take 1 capsule by mouth 2 (two) times daily.     prochlorperazine (COMPAZINE) 10 MG tablet Take 1 tablet (10 mg total) by mouth every 6 (six) hours as needed for nausea or vomiting. 30 tablet 1   rivaroxaban (XARELTO) 10 MG TABS tablet Take 10 mg by mouth daily. For A-fib     Simethicone (GAS RELIEF 80 PO) Take by mouth.     umeclidinium-vilanterol (ANORO ELLIPTA) 62.5-25 MCG/INH AEPB Inhale 1 puff into the lungs daily. 60 each 0   Vitamin D, Cholecalciferol, 25 MCG (1000 UT) CAPS Take 1 capsule by mouth daily.     No current facility-administered medications for this visit.     SURGICAL HISTORY:    Past Surgical History:  Procedure Laterality Date   ABDOMINAL HYSTERECTOMY  12/1969   APPENDECTOMY  1957   CATARACT EXTRACTION     CHOLECYSTECTOMY  1997   IR IMAGING GUIDED PORT INSERTION  10/16/2018   LAPAROSCOPIC SIGMOID COLECTOMY  2013   secondary to diverticulitis   spit seed removal     TONSILLECTOMY  1945    REVIEW OF SYSTEMS:  Constitutional: positive for fatigue Eyes: negative Ears, nose, mouth, throat, and face: negative Respiratory: positive for dyspnea on exertion Cardiovascular: negative Gastrointestinal: negative Genitourinary:negative Integument/breast: negative Hematologic/lymphatic: negative Musculoskeletal:negative Neurological: negative Behavioral/Psych: negative Endocrine: negative Allergic/Immunologic: negative   PHYSICAL EXAMINATION: General appearance: alert, cooperative, fatigued and no distress Head: Normocephalic, without obvious abnormality, atraumatic Neck: no adenopathy, no JVD, supple, symmetrical, trachea midline and thyroid not enlarged, symmetric, no tenderness/mass/nodules Lymph nodes: Cervical, supraclavicular, and axillary nodes normal. Resp: clear to auscultation bilaterally Back: symmetric, no curvature. ROM normal. No CVA tenderness. Cardio: regular rate and rhythm, S1, S2 normal, no murmur, click, rub or gallop GI: soft, non-tender; bowel sounds normal; no masses,  no organomegaly Extremities: extremities normal, atraumatic, no cyanosis or edema Neurologic: Alert and oriented X 3, normal strength and tone. Normal symmetric reflexes. Normal coordination and gait  ECOG PERFORMANCE STATUS: 1 - Symptomatic but completely ambulatory  Blood pressure 131/85, pulse 95, temperature 98.2 F (36.8 C), temperature source Oral, resp. rate 18, height '5\' 2"'$  (1.575 m), weight 116 lb (52.6 kg), SpO2 100 %.  LABORATORY DATA: Lab Results  Component Value Date   WBC 7.1 01/29/2019   HGB 8.8 (L) 01/29/2019   HCT 26.4 (L) 01/29/2019   MCV  95.3 01/29/2019   PLT 365 01/29/2019      Chemistry      Component Value Date/Time   NA 132 (L) 01/22/2019 1332   NA 136 05/14/2018 1225   NA 134 (L) 04/23/2012 0640   K 3.1 (L) 01/22/2019 1332   K 2.8 (L) 04/23/2012 0640   CL 92 (L) 01/22/2019 1332   CL 91 (L) 04/23/2012 0640   CO2 29  01/22/2019 1332   CO2 32 04/23/2012 0640   BUN 9 01/22/2019 1332   BUN 8 05/14/2018 1225   BUN 13 04/23/2012 0640   CREATININE 0.57 01/22/2019 1332   CREATININE 0.72 07/04/2017 1045      Component Value Date/Time   CALCIUM 8.9 01/22/2019 1332   CALCIUM 8.3 (L) 04/23/2012 0640   ALKPHOS 97 01/22/2019 1332   ALKPHOS 54 04/05/2012 0957   AST 32 01/22/2019 1332   ALT 29 01/22/2019 1332   ALT 23 04/05/2012 0957   BILITOT 0.2 (L) 01/22/2019 1332       RADIOGRAPHIC STUDIES: Ct Chest W Contrast  Result Date: 01/22/2019 CLINICAL DATA:  Stage IV lung cancer, for restaging EXAM: CT CHEST, ABDOMEN, AND PELVIS WITH CONTRAST TECHNIQUE: Multidetector CT imaging of the chest, abdomen and pelvis was performed following the standard protocol during bolus administration of intravenous contrast. CONTRAST:  17m OMNIPAQUE IOHEXOL 300 MG/ML  SOLN COMPARISON:  CTA chest dated 11/19/2018. PET-CT dated 09/20/2018. CT abdomen/pelvis dated 03/08/2012. FINDINGS: CT CHEST FINDINGS Cardiovascular: Heart is normal in size.  No pericardial effusion. No evidence of thoracic aortic aneurysm. Atherosclerotic calcifications aortic root/arch. Three vessel coronary atherosclerosis. Right chest port terminates the cavoatrial junction. Mediastinum/Nodes: 9 mm short axis subcarinal node (series 2/image 31), within the upper limits of normal. Additional small mediastinal lymph nodes. 10 mm short axis left perihilar node (series 2/image 33), with corresponding hypermetabolism on prior PET. Visualized thyroid is unremarkable. Lungs/Pleura: 16 x 10 mm subpleural nodule in the lateral left upper lobe/lingula, corresponding to the patient's  known primary bronchogenic neoplasm, grossly unchanged. 5 mm subpleural left lower lobe nodule (series 4/image 120), previously 8 mm. Scarring/fibrosis in the central left lower lobe (series 4/image 110), possibly reflecting radiation changes. Associated pleural-based nodularity in the left hemithorax, similar. Loculated trace left pleural effusion. Moderate centrilobular and paraseptal emphysematous changes. Volume loss in the left hemithorax. No pneumothorax. Musculoskeletal: Mild degenerative changes of the mid thoracic spine. CT ABDOMEN PELVIS FINDINGS Hepatobiliary: 14 mm hypoenhancing lesion in segment 7 (series 2/image 51), new, suspicious for metastasis. Additional subcentimeter lesion in segment 8 (series 2/image 84) is also suspicious for metastasis. Status post cholecystectomy. No intrahepatic ductal dilatation. Distal CBD calculi measuring up to 15 mm in the mid common duct (series 2/image 69), chronic when compared to 2013. Pancreas: Within normal limits. Spleen: Within normal limits. Adrenals/Urinary Tract: Adrenal glands are within normal limits. Kidneys are within normal limits.  No hydronephrosis. Bladder is within normal limits. Stomach/Bowel: Stomach is within normal limits. No evidence of bowel obstruction. Appendix is not discretely visualized. Status post left hemicolectomy with suture line in the lower pelvis (series 2/image 99). Vascular/Lymphatic: No evidence of abdominal aortic aneurysm. Atherosclerotic calcifications of the abdominal aorta and branch vessels. Small retroperitoneal lymph nodes, including a 12 mm short axis left para-aortic node (series 2/image 68), nonspecific. Reproductive: Status post hysterectomy. 3.6 x 5.8 cm left adnexal fluid collection (series 2/image 96), nonspecific. No right adnexal mass. Other: No abdominopelvic ascites. Musculoskeletal: Visualized osseous structures are within normal limits. IMPRESSION: 16 x 10 mm left upper lobe nodule, grossly unchanged,  corresponding to the patient's known primary bronchogenic neoplasm. Associated pleural nodularity with trace left pleural effusion, corresponding to pleural-based malignancy, grossly unchanged. Additional 5 mm subpleural nodule in the left lower lobe, mildly improved. Suspected post treatment changes in the left lower lobe. Volume loss in the left hemithorax. Small mediastinal and left perihilar nodes, grossly unchanged. Two new hepatic metastases in the right hepatic lobe, as  above. Additional ancillary findings as above. Electronically Signed   By: Julian Hy M.D.   On: 01/22/2019 15:59   Ct Abdomen Pelvis W Contrast  Result Date: 01/22/2019 CLINICAL DATA:  Stage IV lung cancer, for restaging EXAM: CT CHEST, ABDOMEN, AND PELVIS WITH CONTRAST TECHNIQUE: Multidetector CT imaging of the chest, abdomen and pelvis was performed following the standard protocol during bolus administration of intravenous contrast. CONTRAST:  74m OMNIPAQUE IOHEXOL 300 MG/ML  SOLN COMPARISON:  CTA chest dated 11/19/2018. PET-CT dated 09/20/2018. CT abdomen/pelvis dated 03/08/2012. FINDINGS: CT CHEST FINDINGS Cardiovascular: Heart is normal in size.  No pericardial effusion. No evidence of thoracic aortic aneurysm. Atherosclerotic calcifications aortic root/arch. Three vessel coronary atherosclerosis. Right chest port terminates the cavoatrial junction. Mediastinum/Nodes: 9 mm short axis subcarinal node (series 2/image 31), within the upper limits of normal. Additional small mediastinal lymph nodes. 10 mm short axis left perihilar node (series 2/image 33), with corresponding hypermetabolism on prior PET. Visualized thyroid is unremarkable. Lungs/Pleura: 16 x 10 mm subpleural nodule in the lateral left upper lobe/lingula, corresponding to the patient's known primary bronchogenic neoplasm, grossly unchanged. 5 mm subpleural left lower lobe nodule (series 4/image 120), previously 8 mm. Scarring/fibrosis in the central left lower  lobe (series 4/image 110), possibly reflecting radiation changes. Associated pleural-based nodularity in the left hemithorax, similar. Loculated trace left pleural effusion. Moderate centrilobular and paraseptal emphysematous changes. Volume loss in the left hemithorax. No pneumothorax. Musculoskeletal: Mild degenerative changes of the mid thoracic spine. CT ABDOMEN PELVIS FINDINGS Hepatobiliary: 14 mm hypoenhancing lesion in segment 7 (series 2/image 51), new, suspicious for metastasis. Additional subcentimeter lesion in segment 8 (series 2/image 84) is also suspicious for metastasis. Status post cholecystectomy. No intrahepatic ductal dilatation. Distal CBD calculi measuring up to 15 mm in the mid common duct (series 2/image 69), chronic when compared to 2013. Pancreas: Within normal limits. Spleen: Within normal limits. Adrenals/Urinary Tract: Adrenal glands are within normal limits. Kidneys are within normal limits.  No hydronephrosis. Bladder is within normal limits. Stomach/Bowel: Stomach is within normal limits. No evidence of bowel obstruction. Appendix is not discretely visualized. Status post left hemicolectomy with suture line in the lower pelvis (series 2/image 99). Vascular/Lymphatic: No evidence of abdominal aortic aneurysm. Atherosclerotic calcifications of the abdominal aorta and branch vessels. Small retroperitoneal lymph nodes, including a 12 mm short axis left para-aortic node (series 2/image 68), nonspecific. Reproductive: Status post hysterectomy. 3.6 x 5.8 cm left adnexal fluid collection (series 2/image 96), nonspecific. No right adnexal mass. Other: No abdominopelvic ascites. Musculoskeletal: Visualized osseous structures are within normal limits. IMPRESSION: 16 x 10 mm left upper lobe nodule, grossly unchanged, corresponding to the patient's known primary bronchogenic neoplasm. Associated pleural nodularity with trace left pleural effusion, corresponding to pleural-based malignancy, grossly  unchanged. Additional 5 mm subpleural nodule in the left lower lobe, mildly improved. Suspected post treatment changes in the left lower lobe. Volume loss in the left hemithorax. Small mediastinal and left perihilar nodes, grossly unchanged. Two new hepatic metastases in the right hepatic lobe, as above. Additional ancillary findings as above. Electronically Signed   By: SJulian HyM.D.   On: 01/22/2019 15:59    ASSESSMENT AND PLAN: This is a very pleasant 78years old white female recently diagnosed with stage IV (T1b, N2, M1a) non-small cell lung cancer highly suspicious for adenocarcinoma presented with left upper lobe lung nodule in addition to left hilar and mediastinal lymphadenopathy as well as malignant right pleural effusion and pleural based metastasis diagnosed in January  2020. The patient was started on systemic chemotherapy with carboplatin, Alimta and Avastin status post 4 cycles.   The patient has been tolerating this treatment well with no concerning adverse effects. She had repeat CT scan of the chest, abdomen and pelvis performed recently.  I personally and independently reviewed the scan images and discussed the result and showed the images to the patient today. Her scan showed a stable disease in the chest but there was evidence for new lesions in the liver suspicious for metastatic disease. I recommended for the patient to discontinue her current treatment with chemotherapy with carboplatin, Alimta and Avastin. I discussed with the patient and husband treatment options including palliative care versus second line treatment with docetaxel and Cyramza versus second line treatment with single agent gemcitabine.  Her performance status and other comorbidities will not make her a great candidate for treatment with a combination of docetaxel and Cyramza.  She would like to proceed with the treatment with gemcitabine 1000 mg/M2 on days 1 and 8 every 3 weeks.  She is expected to start  the first cycle of this treatment on 02/05/2011. The patient will come back for follow-up visit in 4 weeks for evaluation before starting cycle #2. For hypertension she will continue with her current blood pressure medication and monitor it closely at home. For the chemotherapy-induced anemia, we will continue to monitor it closely and consider the patient for transfusion if needed. For the hypokalemia, I will increase her potassium chloride to 40 mEq p.o. daily for the next 10 days. She was advised to call immediately if she has any concerning symptoms in the interval. The patient voices understanding of current disease status and treatment options and is in agreement with the current care plan. All questions were answered. The patient knows to call the clinic with any problems, questions or concerns. We can certainly see the patient much sooner if necessary.   Disclaimer: This note was dictated with voice recognition software. Similar sounding words can inadvertently be transcribed and may not be corrected upon review.

## 2019-01-30 ENCOUNTER — Telehealth: Payer: Self-pay

## 2019-01-30 NOTE — Telephone Encounter (Signed)
Nutrition Assessment   Reason for Assessment:   Patient identified on Malnutrition Screening tool for weight loss and poor appetite   ASSESSMENT:   78 year old female with lung cancer followed by Dr. Earlie Server.  Past medical history of COPD, HTN, hypercholesterolemia.  Noted new liver lesions suspicious for metastatic disease per MD noted.  Planning to change treatment to gemcitabine.    RD working remotely.  Spoke with patient via phone this am and introduce self and service.  Patient reports that appetite is up and down with chemotherapy treatment.  Reports typically would get nauseated few days after treatment and not be able to eat.  Reports has been eating better as have not had treatment in 4 weeks.  Reports that son has been with her the last few weeks and cooking and preparing meals for her.  Reports typically has others to prepare meals for her or utilized microwave for cooking.  Does not cook anymore due to RA.  Reports that she drinks ensure and carnation breakfast essentials.  Trying to eat foods high in iron and K.    Nutrition Focused Physical Exam: deferred   Medications: b complex, calcium carb, colace, pepcid, ferrous sulfate, folic acid, lasix, MVI, omega 3   Labs: K 2.8, Na 132   Anthropometrics:   Height: 62 inches Weight: 116 lb on 6/2 up from 113 lb on 5/13 Noted weight on 2/5/20202 138 lb BMI: 20  16% weight loss in the last 4 months, significant   Estimated Energy Needs  Kcals: 1610-9604 Protein: 80-93 g Fluid: 1.8 L   NUTRITION DIAGNOSIS: Inadequate oral intake related to cancer related treatment side effects as evidenced by 16% weight loss and poor appetite with nausea    INTERVENTION:  Discussed strategies to increase calories and protein.  Discussed foods high in iron and K.   Encouraged small frequent meals Encouraged patient to continue with oral nutrition supplements at least 1-2 times per day Contact information provided and patient  will reach out to RD with questions or concerns   MONITORING, EVALUATION, GOAL: Patient will consume adequate calories and protein to prevent further weight loss   Next Visit: patient to contact RD as needed  Keisean Skowron B. Zenia Resides, [place], [place] Registered Dietitian [phone] (pager)

## 2019-01-30 NOTE — Telephone Encounter (Signed)
Patient's son Purcell Nails states Medicare denied [place] care for patient. He wants to know if there is any other services patient may be able to get. CB#[phone]

## 2019-01-30 NOTE — Telephone Encounter (Signed)
Please review

## 2019-01-31 NOTE — Telephone Encounter (Signed)
She should check with Dr. Lew Dawes office regarding possible need for Hospice or Home Heath assistance.

## 2019-01-31 NOTE — Telephone Encounter (Signed)
Voicemail has not yet been set up.

## 2019-02-03 DIAGNOSIS — J449 Chronic obstructive pulmonary disease, unspecified: Secondary | ICD-10-CM | POA: Diagnosis not present

## 2019-02-03 DIAGNOSIS — C3492 Malignant neoplasm of unspecified part of left bronchus or lung: Secondary | ICD-10-CM | POA: Diagnosis not present

## 2019-02-03 DIAGNOSIS — J9611 Chronic respiratory failure with hypoxia: Secondary | ICD-10-CM | POA: Diagnosis not present

## 2019-02-04 ENCOUNTER — Telehealth: Payer: Self-pay | Admitting: Internal Medicine

## 2019-02-04 NOTE — Telephone Encounter (Signed)
NO ANSWER

## 2019-02-04 NOTE — Telephone Encounter (Signed)
Scheduled appt - confirmed appt for 6/9 - pt aware of appt date and time

## 2019-02-05 ENCOUNTER — Inpatient Hospital Stay: Payer: Medicare Other

## 2019-02-05 ENCOUNTER — Other Ambulatory Visit: Payer: Self-pay

## 2019-02-05 ENCOUNTER — Ambulatory Visit (HOSPITAL_COMMUNITY): Payer: Medicare Other

## 2019-02-05 VITALS — BP 151/71 | HR 60 | Temp 99.1°F | Resp 18

## 2019-02-05 DIAGNOSIS — C3412 Malignant neoplasm of upper lobe, left bronchus or lung: Secondary | ICD-10-CM | POA: Diagnosis not present

## 2019-02-05 DIAGNOSIS — C3492 Malignant neoplasm of unspecified part of left bronchus or lung: Secondary | ICD-10-CM

## 2019-02-05 DIAGNOSIS — Z7901 Long term (current) use of anticoagulants: Secondary | ICD-10-CM | POA: Diagnosis not present

## 2019-02-05 DIAGNOSIS — J449 Chronic obstructive pulmonary disease, unspecified: Secondary | ICD-10-CM | POA: Diagnosis not present

## 2019-02-05 DIAGNOSIS — E875 Hyperkalemia: Secondary | ICD-10-CM | POA: Diagnosis not present

## 2019-02-05 DIAGNOSIS — I700 Atherosclerosis of aorta: Secondary | ICD-10-CM | POA: Diagnosis not present

## 2019-02-05 DIAGNOSIS — Z7982 Long term (current) use of aspirin: Secondary | ICD-10-CM | POA: Diagnosis not present

## 2019-02-05 DIAGNOSIS — I110 Hypertensive heart disease with heart failure: Secondary | ICD-10-CM | POA: Diagnosis not present

## 2019-02-05 DIAGNOSIS — U071 COVID-19: Secondary | ICD-10-CM | POA: Diagnosis not present

## 2019-02-05 DIAGNOSIS — T451X5A Adverse effect of antineoplastic and immunosuppressive drugs, initial encounter: Secondary | ICD-10-CM | POA: Diagnosis not present

## 2019-02-05 DIAGNOSIS — D6481 Anemia due to antineoplastic chemotherapy: Secondary | ICD-10-CM | POA: Diagnosis not present

## 2019-02-05 DIAGNOSIS — C787 Secondary malignant neoplasm of liver and intrahepatic bile duct: Secondary | ICD-10-CM | POA: Diagnosis not present

## 2019-02-05 DIAGNOSIS — Z79899 Other long term (current) drug therapy: Secondary | ICD-10-CM | POA: Diagnosis not present

## 2019-02-05 DIAGNOSIS — E7800 Pure hypercholesterolemia, unspecified: Secondary | ICD-10-CM | POA: Diagnosis not present

## 2019-02-05 DIAGNOSIS — I4891 Unspecified atrial fibrillation: Secondary | ICD-10-CM | POA: Diagnosis not present

## 2019-02-05 DIAGNOSIS — Z5111 Encounter for antineoplastic chemotherapy: Secondary | ICD-10-CM | POA: Diagnosis not present

## 2019-02-05 DIAGNOSIS — M069 Rheumatoid arthritis, unspecified: Secondary | ICD-10-CM | POA: Diagnosis not present

## 2019-02-05 DIAGNOSIS — M810 Age-related osteoporosis without current pathological fracture: Secondary | ICD-10-CM | POA: Diagnosis not present

## 2019-02-05 DIAGNOSIS — Z95828 Presence of other vascular implants and grafts: Secondary | ICD-10-CM

## 2019-02-05 DIAGNOSIS — K5900 Constipation, unspecified: Secondary | ICD-10-CM | POA: Diagnosis not present

## 2019-02-05 DIAGNOSIS — K121 Other forms of stomatitis: Secondary | ICD-10-CM | POA: Diagnosis not present

## 2019-02-05 DIAGNOSIS — M329 Systemic lupus erythematosus, unspecified: Secondary | ICD-10-CM | POA: Diagnosis not present

## 2019-02-05 DIAGNOSIS — C782 Secondary malignant neoplasm of pleura: Secondary | ICD-10-CM | POA: Diagnosis not present

## 2019-02-05 DIAGNOSIS — R0602 Shortness of breath: Secondary | ICD-10-CM | POA: Diagnosis not present

## 2019-02-05 DIAGNOSIS — L989 Disorder of the skin and subcutaneous tissue, unspecified: Secondary | ICD-10-CM | POA: Diagnosis not present

## 2019-02-05 LAB — CMP (CANCER CENTER ONLY)
ALT: 19 U/L (ref 0–44)
AST: 21 U/L (ref 15–41)
Albumin: 2.3 g/dL — ABNORMAL LOW (ref 3.5–5.0)
Alkaline Phosphatase: 129 U/L — ABNORMAL HIGH (ref 38–126)
Anion gap: 11 (ref 5–15)
BUN: 13 mg/dL (ref 8–23)
CO2: 24 mmol/L (ref 22–32)
Calcium: 9.2 mg/dL (ref 8.9–10.3)
Chloride: 97 mmol/L — ABNORMAL LOW (ref 98–111)
Creatinine: 0.68 mg/dL (ref 0.44–1.00)
GFR, Est AFR Am: >60 mL/min (ref >60)
GFR, Estimated: >60 mL/min (ref >60)
Glucose, Bld: 83 mg/dL (ref 70–99)
Potassium: 4.4 mmol/L (ref 3.5–5.1)
Sodium: 132 mmol/L — ABNORMAL LOW (ref 135–145)
Total Bilirubin: 0.4 mg/dL (ref 0.3–1.2)
Total Protein: 6.9 g/dL (ref 6.5–8.1)

## 2019-02-05 LAB — CBC WITH DIFFERENTIAL (CANCER CENTER ONLY)
Abs Immature Granulocytes: 0.05 10*3/uL (ref 0.00–0.07)
Basophils Absolute: 0.1 10*3/uL (ref 0.0–0.1)
Basophils Relative: 2 %
Eosinophils Absolute: 0.1 10*3/uL (ref 0.0–0.5)
Eosinophils Relative: 1 %
HCT: 27.8 % — ABNORMAL LOW (ref 36.0–46.0)
Hemoglobin: 9 g/dL — ABNORMAL LOW (ref 12.0–15.0)
Immature Granulocytes: 1 %
Lymphocytes Relative: 26 %
Lymphs Abs: 2.5 10*3/uL (ref 0.7–4.0)
MCH: 31.8 pg (ref 26.0–34.0)
MCHC: 32.4 g/dL (ref 30.0–36.0)
MCV: 98.2 fL (ref 80.0–100.0)
Monocytes Absolute: 1.1 10*3/uL — ABNORMAL HIGH (ref 0.1–1.0)
Monocytes Relative: 11 %
Neutro Abs: 5.8 10*3/uL (ref 1.7–7.7)
Neutrophils Relative %: 59 %
Platelet Count: 452 10*3/uL — ABNORMAL HIGH (ref 150–400)
RBC: 2.83 MIL/uL — ABNORMAL LOW (ref 3.87–5.11)
RDW: 20.9 % — ABNORMAL HIGH (ref 11.5–15.5)
WBC Count: 9.6 10*3/uL (ref 4.0–10.5)
nRBC: 0 % (ref 0.0–0.2)

## 2019-02-05 MED ORDER — PROCHLORPERAZINE MALEATE 10 MG PO TABS
10.0000 mg | ORAL_TABLET | Freq: Once | ORAL | Status: AC
  Administered 2019-02-05: 10 mg via ORAL

## 2019-02-05 MED ORDER — SODIUM CHLORIDE 0.9% FLUSH
10.0000 mL | INTRAVENOUS | Status: DC | PRN
  Administered 2019-02-05: 10 mL
  Filled 2019-02-05: qty 10

## 2019-02-05 MED ORDER — HEPARIN SOD (PORK) LOCK FLUSH 100 UNIT/ML IV SOLN
500.0000 [IU] | Freq: Once | INTRAVENOUS | Status: AC | PRN
  Administered 2019-02-05: 500 [IU]
  Filled 2019-02-05: qty 5

## 2019-02-05 MED ORDER — SODIUM CHLORIDE 0.9 % IV SOLN
1000.0000 mg/m2 | Freq: Once | INTRAVENOUS | Status: AC
  Administered 2019-02-05: 15:00:00 1520 mg via INTRAVENOUS
  Filled 2019-02-05: qty 39.98

## 2019-02-05 MED ORDER — PROCHLORPERAZINE MALEATE 10 MG PO TABS
ORAL_TABLET | ORAL | Status: AC
  Filled 2019-02-05: qty 1

## 2019-02-05 MED ORDER — SODIUM CHLORIDE 0.9 % IV SOLN
Freq: Once | INTRAVENOUS | Status: AC
  Administered 2019-02-05: 15:00:00 via INTRAVENOUS
  Filled 2019-02-05: qty 250

## 2019-02-05 MED ORDER — SODIUM CHLORIDE 0.9% FLUSH
10.0000 mL | INTRAVENOUS | Status: DC | PRN
  Administered 2019-02-05: 16:00:00 10 mL
  Filled 2019-02-05: qty 10

## 2019-02-05 NOTE — Patient Instructions (Signed)
[facility] Discharge Instructions for Patients Receiving Chemotherapy  Today you received the following chemotherapy agents: Gemzar.  To help prevent nausea and vomiting after your treatment, we encourage you to take your nausea medication as prescribed.   If you develop nausea and vomiting that is not controlled by your nausea medication, call the clinic.   BELOW ARE SYMPTOMS THAT SHOULD BE REPORTED IMMEDIATELY:  *FEVER GREATER THAN 100.5 F  *CHILLS WITH OR WITHOUT FEVER  NAUSEA AND VOMITING THAT IS NOT CONTROLLED WITH YOUR NAUSEA MEDICATION  *UNUSUAL SHORTNESS OF BREATH  *UNUSUAL BRUISING OR BLEEDING  TENDERNESS IN MOUTH AND THROAT WITH OR WITHOUT PRESENCE OF ULCERS  *URINARY PROBLEMS  *BOWEL PROBLEMS  UNUSUAL RASH Items with * indicate a potential emergency and should be followed up as soon as possible.  Feel free to call the clinic should you have any questions or concerns. The clinic phone number is [phone].  Please show the [facility] at check-in to the Emergency Department and triage nurse.  Gemcitabine injection (Gemzar) What is this medicine? GEMCITABINE (jem SYE ta been) is a chemotherapy drug. This medicine is used to treat many types of cancer like breast cancer, lung cancer, pancreatic cancer, and ovarian cancer. This medicine may be used for other purposes; ask your health care provider or pharmacist if you have questions. COMMON BRAND NAME(S): Gemzar, Infugem What should I tell my health care provider before I take this medicine? They need to know if you have any of these conditions: -blood disorders -infection -kidney disease -liver disease -lung or breathing disease, like asthma -recent or ongoing radiation therapy -an unusual or allergic reaction to gemcitabine, other chemotherapy, other medicines, foods, dyes, or preservatives -pregnant or trying to get pregnant -breast-feeding How should I use this medicine? This drug  is given as an infusion into a vein. It is administered in a hospital or clinic by a specially trained health care professional. Talk to your pediatrician regarding the use of this medicine in children. Special care may be needed. Overdosage: If you think you have taken too much of this medicine contact a poison control center or emergency room at once. NOTE: This medicine is only for you. Do not share this medicine with others. What if I miss a dose? It is important not to miss your dose. Call your doctor or health care professional if you are unable to keep an appointment. What may interact with this medicine? -medicines to increase blood counts like filgrastim, pegfilgrastim, sargramostim -some other chemotherapy drugs like cisplatin -vaccines Talk to your doctor or health care professional before taking any of these medicines: -acetaminophen -aspirin -ibuprofen -ketoprofen -naproxen This list may not describe all possible interactions. Give your health care provider a list of all the medicines, herbs, non-prescription drugs, or dietary supplements you use. Also tell them if you smoke, drink alcohol, or use illegal drugs. Some items may interact with your medicine. What should I watch for while using this medicine? Visit your doctor for checks on your progress. This drug may make you feel generally unwell. This is not uncommon, as chemotherapy can affect healthy cells as well as cancer cells. Report any side effects. Continue your course of treatment even though you feel ill unless your doctor tells you to stop. In some cases, you may be given additional medicines to help with side effects. Follow all directions for their use. Call your doctor or health care professional for advice if you get a fever, chills or sore  throat, or other symptoms of a cold or flu. Do not treat yourself. This drug decreases your body's ability to fight infections. Try to avoid being around people who are sick. This  medicine may increase your risk to bruise or bleed. Call your doctor or health care professional if you notice any unusual bleeding. Be careful brushing and flossing your teeth or using a toothpick because you may get an infection or bleed more easily. If you have any dental work done, tell your dentist you are receiving this medicine. Avoid taking products that contain aspirin, acetaminophen, ibuprofen, naproxen, or ketoprofen unless instructed by your doctor. These medicines may hide a fever. Do not become pregnant while taking this medicine or for 6 months after stopping it. Women should inform their doctor if they wish to become pregnant or think they might be pregnant. Men should not father a child while taking this medicine and for 3 months after stopping it. There is a potential for serious side effects to an unborn child. Talk to your health care professional or pharmacist for more information. Do not breast-feed an infant while taking this medicine or for at least 1 week after stopping it. Men should inform their doctors if they wish to father a child. This medicine may lower sperm counts. Talk with your doctor or health care professional if you are concerned about your fertility. What side effects may I notice from receiving this medicine? Side effects that you should report to your doctor or health care professional as soon as possible: -allergic reactions like skin rash, itching or hives, swelling of the face, lips, or tongue -breathing problems -pain, redness, or irritation at site where injected -signs and symptoms of a dangerous change in heartbeat or heart rhythm like chest pain; dizziness; fast or irregular heartbeat; palpitations; feeling faint or lightheaded, falls; breathing problems -signs of decreased platelets or bleeding - bruising, pinpoint red spots on the skin, black, tarry stools, blood in the urine -signs of decreased red blood cells - unusually weak or tired, feeling faint  or lightheaded, falls -signs of infection - fever or chills, cough, sore throat, pain or difficulty passing urine -signs and symptoms of kidney injury like trouble passing urine or change in the amount of urine -signs and symptoms of liver injury like dark yellow or brown urine; general ill feeling or flu-like symptoms; light-colored stools; loss of appetite; nausea; right upper belly pain; unusually weak or tired; yellowing of the eyes or skin -swelling of ankles, feet, hands Side effects that usually do not require medical attention (report to your doctor or health care professional if they continue or are bothersome): -constipation -diarrhea -hair loss -loss of appetite -nausea -rash -vomiting This list may not describe all possible side effects. Call your doctor for medical advice about side effects. You may report side effects to FDA at 1-800-FDA-1088. Where should I keep my medicine? This drug is given in a hospital or clinic and will not be stored at home. NOTE: This sheet is a summary. It may not cover all possible information. If you have questions about this medicine, talk to your doctor, pharmacist, or health care provider.  2019 Elsevier/Gold Standard (2017-11-08 18:06:11)

## 2019-02-06 ENCOUNTER — Other Ambulatory Visit: Payer: Self-pay | Admitting: Medical Oncology

## 2019-02-06 NOTE — Telephone Encounter (Signed)
Patient's son advised as below.

## 2019-02-08 DIAGNOSIS — C3492 Malignant neoplasm of unspecified part of left bronchus or lung: Secondary | ICD-10-CM | POA: Diagnosis not present

## 2019-02-08 DIAGNOSIS — J9611 Chronic respiratory failure with hypoxia: Secondary | ICD-10-CM | POA: Diagnosis not present

## 2019-02-08 DIAGNOSIS — J449 Chronic obstructive pulmonary disease, unspecified: Secondary | ICD-10-CM | POA: Diagnosis not present

## 2019-02-12 ENCOUNTER — Other Ambulatory Visit: Payer: Self-pay

## 2019-02-12 ENCOUNTER — Other Ambulatory Visit: Payer: Medicare Other

## 2019-02-12 ENCOUNTER — Other Ambulatory Visit: Payer: Self-pay | Admitting: Internal Medicine

## 2019-02-12 ENCOUNTER — Inpatient Hospital Stay: Payer: Medicare Other

## 2019-02-12 ENCOUNTER — Telehealth: Payer: Self-pay | Admitting: *Deleted

## 2019-02-12 VITALS — BP 152/83 | HR 57 | Temp 98.9°F | Wt 121.5 lb

## 2019-02-12 DIAGNOSIS — Z7982 Long term (current) use of aspirin: Secondary | ICD-10-CM | POA: Diagnosis not present

## 2019-02-12 DIAGNOSIS — E7800 Pure hypercholesterolemia, unspecified: Secondary | ICD-10-CM | POA: Diagnosis not present

## 2019-02-12 DIAGNOSIS — I4891 Unspecified atrial fibrillation: Secondary | ICD-10-CM | POA: Diagnosis not present

## 2019-02-12 DIAGNOSIS — T451X5A Adverse effect of antineoplastic and immunosuppressive drugs, initial encounter: Secondary | ICD-10-CM | POA: Diagnosis not present

## 2019-02-12 DIAGNOSIS — I110 Hypertensive heart disease with heart failure: Secondary | ICD-10-CM | POA: Diagnosis not present

## 2019-02-12 DIAGNOSIS — Z79899 Other long term (current) drug therapy: Secondary | ICD-10-CM | POA: Diagnosis not present

## 2019-02-12 DIAGNOSIS — K121 Other forms of stomatitis: Secondary | ICD-10-CM | POA: Diagnosis not present

## 2019-02-12 DIAGNOSIS — C787 Secondary malignant neoplasm of liver and intrahepatic bile duct: Secondary | ICD-10-CM | POA: Diagnosis not present

## 2019-02-12 DIAGNOSIS — R0602 Shortness of breath: Secondary | ICD-10-CM | POA: Diagnosis not present

## 2019-02-12 DIAGNOSIS — C3492 Malignant neoplasm of unspecified part of left bronchus or lung: Secondary | ICD-10-CM

## 2019-02-12 DIAGNOSIS — D6481 Anemia due to antineoplastic chemotherapy: Secondary | ICD-10-CM | POA: Diagnosis not present

## 2019-02-12 DIAGNOSIS — J449 Chronic obstructive pulmonary disease, unspecified: Secondary | ICD-10-CM | POA: Diagnosis not present

## 2019-02-12 DIAGNOSIS — K5900 Constipation, unspecified: Secondary | ICD-10-CM | POA: Diagnosis not present

## 2019-02-12 DIAGNOSIS — L989 Disorder of the skin and subcutaneous tissue, unspecified: Secondary | ICD-10-CM | POA: Diagnosis not present

## 2019-02-12 DIAGNOSIS — C3412 Malignant neoplasm of upper lobe, left bronchus or lung: Secondary | ICD-10-CM | POA: Diagnosis not present

## 2019-02-12 DIAGNOSIS — M069 Rheumatoid arthritis, unspecified: Secondary | ICD-10-CM | POA: Diagnosis not present

## 2019-02-12 DIAGNOSIS — Z7901 Long term (current) use of anticoagulants: Secondary | ICD-10-CM | POA: Diagnosis not present

## 2019-02-12 DIAGNOSIS — M329 Systemic lupus erythematosus, unspecified: Secondary | ICD-10-CM | POA: Diagnosis not present

## 2019-02-12 DIAGNOSIS — Z95828 Presence of other vascular implants and grafts: Secondary | ICD-10-CM

## 2019-02-12 DIAGNOSIS — C782 Secondary malignant neoplasm of pleura: Secondary | ICD-10-CM | POA: Diagnosis not present

## 2019-02-12 DIAGNOSIS — E875 Hyperkalemia: Secondary | ICD-10-CM | POA: Diagnosis not present

## 2019-02-12 DIAGNOSIS — M810 Age-related osteoporosis without current pathological fracture: Secondary | ICD-10-CM | POA: Diagnosis not present

## 2019-02-12 DIAGNOSIS — I700 Atherosclerosis of aorta: Secondary | ICD-10-CM | POA: Diagnosis not present

## 2019-02-12 DIAGNOSIS — U071 COVID-19: Secondary | ICD-10-CM | POA: Diagnosis not present

## 2019-02-12 DIAGNOSIS — Z5111 Encounter for antineoplastic chemotherapy: Secondary | ICD-10-CM | POA: Diagnosis not present

## 2019-02-12 LAB — CBC WITH DIFFERENTIAL (CANCER CENTER ONLY)
Abs Immature Granulocytes: 0.01 10*3/uL (ref 0.00–0.07)
Basophils Absolute: 0.1 10*3/uL (ref 0.0–0.1)
Basophils Relative: 2 %
Eosinophils Absolute: 0 10*3/uL (ref 0.0–0.5)
Eosinophils Relative: 0 %
HCT: 25.5 % — ABNORMAL LOW (ref 36.0–46.0)
Hemoglobin: 8.6 g/dL — ABNORMAL LOW (ref 12.0–15.0)
Immature Granulocytes: 0 %
Lymphocytes Relative: 25 %
Lymphs Abs: 1.4 10*3/uL (ref 0.7–4.0)
MCH: 32.2 pg (ref 26.0–34.0)
MCHC: 33.7 g/dL (ref 30.0–36.0)
MCV: 95.5 fL (ref 80.0–100.0)
Monocytes Absolute: 0.7 10*3/uL (ref 0.1–1.0)
Monocytes Relative: 12 %
Neutro Abs: 3.4 10*3/uL (ref 1.7–7.7)
Neutrophils Relative %: 61 %
Platelet Count: 228 10*3/uL (ref 150–400)
RBC: 2.67 MIL/uL — ABNORMAL LOW (ref 3.87–5.11)
RDW: 19.2 % — ABNORMAL HIGH (ref 11.5–15.5)
WBC Count: 5.7 10*3/uL (ref 4.0–10.5)
nRBC: 0.4 % — ABNORMAL HIGH (ref 0.0–0.2)

## 2019-02-12 LAB — CMP (CANCER CENTER ONLY)
ALT: 44 U/L (ref 0–44)
AST: 70 U/L — ABNORMAL HIGH (ref 15–41)
Albumin: 2 g/dL — ABNORMAL LOW (ref 3.5–5.0)
Alkaline Phosphatase: 121 U/L (ref 38–126)
Anion gap: 13 (ref 5–15)
BUN: 12 mg/dL (ref 8–23)
CO2: 25 mmol/L (ref 22–32)
Calcium: 9.4 mg/dL (ref 8.9–10.3)
Chloride: 94 mmol/L — ABNORMAL LOW (ref 98–111)
Creatinine: 0.58 mg/dL (ref 0.44–1.00)
GFR, Est AFR Am: >60 mL/min (ref >60)
GFR, Estimated: >60 mL/min (ref >60)
Glucose, Bld: 91 mg/dL (ref 70–99)
Potassium: 2.9 mmol/L — CL (ref 3.5–5.1)
Sodium: 132 mmol/L — ABNORMAL LOW (ref 135–145)
Total Bilirubin: 0.3 mg/dL (ref 0.3–1.2)
Total Protein: 7 g/dL (ref 6.5–8.1)

## 2019-02-12 MED ORDER — PROCHLORPERAZINE MALEATE 10 MG PO TABS
10.0000 mg | ORAL_TABLET | Freq: Once | ORAL | Status: AC
  Administered 2019-02-12: 10 mg via ORAL

## 2019-02-12 MED ORDER — SODIUM CHLORIDE 0.9% FLUSH
10.0000 mL | INTRAVENOUS | Status: DC | PRN
  Administered 2019-02-12: 10 mL
  Filled 2019-02-12: qty 10

## 2019-02-12 MED ORDER — SODIUM CHLORIDE 0.9 % IV SOLN
Freq: Once | INTRAVENOUS | Status: AC
  Administered 2019-02-12: 15:00:00 via INTRAVENOUS
  Filled 2019-02-12: qty 250

## 2019-02-12 MED ORDER — SODIUM CHLORIDE 0.9 % IV SOLN
1000.0000 mg/m2 | Freq: Once | INTRAVENOUS | Status: AC
  Administered 2019-02-12: 1520 mg via INTRAVENOUS
  Filled 2019-02-12: qty 39.98

## 2019-02-12 MED ORDER — PROCHLORPERAZINE MALEATE 10 MG PO TABS
ORAL_TABLET | ORAL | Status: AC
  Filled 2019-02-12: qty 1

## 2019-02-12 MED ORDER — ONDANSETRON HCL 4 MG/2ML IJ SOLN
8.0000 mg | Freq: Once | INTRAMUSCULAR | Status: AC
  Administered 2019-02-12: 8 mg via INTRAVENOUS

## 2019-02-12 MED ORDER — ONDANSETRON HCL 8 MG PO TABS: 8.0000 mg | ORAL_TABLET | Freq: Three times a day (TID) | ORAL | 0 refills | Status: AC | PRN

## 2019-02-12 MED ORDER — HEPARIN SOD (PORK) LOCK FLUSH 100 UNIT/ML IV SOLN
500.0000 [IU] | Freq: Once | INTRAVENOUS | Status: AC | PRN
  Administered 2019-02-12: 500 [IU]
  Filled 2019-02-12: qty 5

## 2019-02-12 MED ORDER — SODIUM CHLORIDE 0.9% FLUSH
10.0000 mL | INTRAVENOUS | Status: DC | PRN
  Administered 2019-02-12: 13:00:00 10 mL
  Filled 2019-02-12: qty 10

## 2019-02-12 MED ORDER — POTASSIUM CHLORIDE CRYS ER 20 MEQ PO TBCR
40.0000 meq | EXTENDED_RELEASE_TABLET | Freq: Once | ORAL | Status: AC
  Administered 2019-02-12: 40 meq via ORAL

## 2019-02-12 MED ORDER — POTASSIUM CHLORIDE CRYS ER 20 MEQ PO TBCR
EXTENDED_RELEASE_TABLET | ORAL | Status: AC
  Filled 2019-02-12: qty 2

## 2019-02-12 MED ORDER — POTASSIUM CHLORIDE ER 20 MEQ PO TBCR: 40.0000 meq | EXTENDED_RELEASE_TABLET | Freq: Every day | ORAL | 0 refills | Status: AC

## 2019-02-12 MED ORDER — ONDANSETRON HCL 4 MG/2ML IJ SOLN
INTRAMUSCULAR | Status: AC
  Filled 2019-02-12: qty 4

## 2019-02-12 NOTE — Patient Instructions (Signed)
[facility] Discharge Instructions for Patients Receiving Chemotherapy  Today you received the following chemotherapy agents Gemzar To help prevent nausea and vomiting after your treatment, we encourage you to take your nausea medication as prescribed.  If you develop nausea and vomiting that is not controlled by your nausea medication, call the clinic.   BELOW ARE SYMPTOMS THAT SHOULD BE REPORTED IMMEDIATELY:  *FEVER GREATER THAN 100.5 F  *CHILLS WITH OR WITHOUT FEVER  NAUSEA AND VOMITING THAT IS NOT CONTROLLED WITH YOUR NAUSEA MEDICATION  *UNUSUAL SHORTNESS OF BREATH  *UNUSUAL BRUISING OR BLEEDING  TENDERNESS IN MOUTH AND THROAT WITH OR WITHOUT PRESENCE OF ULCERS  *URINARY PROBLEMS  *BOWEL PROBLEMS  UNUSUAL RASH Items with * indicate a potential emergency and should be followed up as soon as possible.  Feel free to call the clinic should you have any questions or concerns. The clinic phone number is [phone].  Please show the CHEMO ALERT CARD at check-in to the Emergency Department and triage nurse.   

## 2019-02-12 NOTE — Telephone Encounter (Signed)
Received call report from [facility]  "Today's K+ = 2.9."  Secure Chat sent with results.  Noted read confirmation of read.

## 2019-02-13 ENCOUNTER — Telehealth: Payer: Self-pay | Admitting: *Deleted

## 2019-02-13 NOTE — Telephone Encounter (Signed)
TCT patient as she had called the on call service lasy evening with c/o SOB after her treatment yesterday.  Spoke with patient.  She states she is better today. She used her albuterol nebs last evening and a Xanax. She states she slept well and feels ok today. She used another albuterol neb treatment today, states she feels a bit shaky d/t the medication but otherwise is feeling better ovrall. No other concerns or questions.  She is aware of her upcoming appts.

## 2019-02-19 ENCOUNTER — Ambulatory Visit: Payer: Medicare Other | Admitting: Internal Medicine

## 2019-02-19 ENCOUNTER — Ambulatory Visit: Payer: Medicare Other

## 2019-02-19 ENCOUNTER — Other Ambulatory Visit: Payer: Medicare Other

## 2019-02-26 ENCOUNTER — Inpatient Hospital Stay: Payer: Medicare Other

## 2019-02-26 ENCOUNTER — Telehealth: Payer: Self-pay | Admitting: *Deleted

## 2019-02-26 ENCOUNTER — Other Ambulatory Visit: Payer: Self-pay

## 2019-02-26 ENCOUNTER — Telehealth: Payer: Self-pay | Admitting: Physician Assistant

## 2019-02-26 ENCOUNTER — Encounter: Payer: Self-pay | Admitting: Physician Assistant

## 2019-02-26 ENCOUNTER — Other Ambulatory Visit: Payer: Self-pay | Admitting: Physician Assistant

## 2019-02-26 ENCOUNTER — Telehealth: Payer: Self-pay | Admitting: Cardiology

## 2019-02-26 ENCOUNTER — Inpatient Hospital Stay (HOSPITAL_BASED_OUTPATIENT_CLINIC_OR_DEPARTMENT_OTHER): Payer: Medicare Other | Admitting: Physician Assistant

## 2019-02-26 VITALS — BP 123/63 | HR 91 | Temp 98.7°F | Resp 18 | Ht 62.0 in | Wt 123.4 lb

## 2019-02-26 DIAGNOSIS — M069 Rheumatoid arthritis, unspecified: Secondary | ICD-10-CM

## 2019-02-26 DIAGNOSIS — Z7901 Long term (current) use of anticoagulants: Secondary | ICD-10-CM | POA: Diagnosis not present

## 2019-02-26 DIAGNOSIS — C3492 Malignant neoplasm of unspecified part of left bronchus or lung: Secondary | ICD-10-CM

## 2019-02-26 DIAGNOSIS — Z79899 Other long term (current) drug therapy: Secondary | ICD-10-CM

## 2019-02-26 DIAGNOSIS — C787 Secondary malignant neoplasm of liver and intrahepatic bile duct: Secondary | ICD-10-CM

## 2019-02-26 DIAGNOSIS — D6481 Anemia due to antineoplastic chemotherapy: Secondary | ICD-10-CM

## 2019-02-26 DIAGNOSIS — F17210 Nicotine dependence, cigarettes, uncomplicated: Secondary | ICD-10-CM

## 2019-02-26 DIAGNOSIS — J449 Chronic obstructive pulmonary disease, unspecified: Secondary | ICD-10-CM | POA: Diagnosis not present

## 2019-02-26 DIAGNOSIS — T451X5A Adverse effect of antineoplastic and immunosuppressive drugs, initial encounter: Secondary | ICD-10-CM

## 2019-02-26 DIAGNOSIS — K121 Other forms of stomatitis: Secondary | ICD-10-CM

## 2019-02-26 DIAGNOSIS — Z95828 Presence of other vascular implants and grafts: Secondary | ICD-10-CM

## 2019-02-26 DIAGNOSIS — C3412 Malignant neoplasm of upper lobe, left bronchus or lung: Secondary | ICD-10-CM

## 2019-02-26 DIAGNOSIS — L0390 Cellulitis, unspecified: Secondary | ICD-10-CM | POA: Insufficient documentation

## 2019-02-26 DIAGNOSIS — C782 Secondary malignant neoplasm of pleura: Secondary | ICD-10-CM

## 2019-02-26 DIAGNOSIS — I700 Atherosclerosis of aorta: Secondary | ICD-10-CM

## 2019-02-26 DIAGNOSIS — L03115 Cellulitis of right lower limb: Secondary | ICD-10-CM

## 2019-02-26 DIAGNOSIS — R0602 Shortness of breath: Secondary | ICD-10-CM | POA: Diagnosis not present

## 2019-02-26 DIAGNOSIS — Z5111 Encounter for antineoplastic chemotherapy: Secondary | ICD-10-CM | POA: Diagnosis not present

## 2019-02-26 DIAGNOSIS — E876 Hypokalemia: Secondary | ICD-10-CM

## 2019-02-26 DIAGNOSIS — M810 Age-related osteoporosis without current pathological fracture: Secondary | ICD-10-CM

## 2019-02-26 DIAGNOSIS — E7800 Pure hypercholesterolemia, unspecified: Secondary | ICD-10-CM | POA: Diagnosis not present

## 2019-02-26 DIAGNOSIS — E785 Hyperlipidemia, unspecified: Secondary | ICD-10-CM

## 2019-02-26 DIAGNOSIS — M329 Systemic lupus erythematosus, unspecified: Secondary | ICD-10-CM

## 2019-02-26 DIAGNOSIS — I110 Hypertensive heart disease with heart failure: Secondary | ICD-10-CM | POA: Diagnosis not present

## 2019-02-26 DIAGNOSIS — L989 Disorder of the skin and subcutaneous tissue, unspecified: Secondary | ICD-10-CM | POA: Diagnosis not present

## 2019-02-26 DIAGNOSIS — U071 COVID-19: Secondary | ICD-10-CM | POA: Diagnosis not present

## 2019-02-26 DIAGNOSIS — K5900 Constipation, unspecified: Secondary | ICD-10-CM | POA: Diagnosis not present

## 2019-02-26 DIAGNOSIS — Z7982 Long term (current) use of aspirin: Secondary | ICD-10-CM

## 2019-02-26 DIAGNOSIS — E875 Hyperkalemia: Secondary | ICD-10-CM | POA: Diagnosis not present

## 2019-02-26 DIAGNOSIS — I4891 Unspecified atrial fibrillation: Secondary | ICD-10-CM | POA: Diagnosis not present

## 2019-02-26 LAB — CBC WITH DIFFERENTIAL (CANCER CENTER ONLY)
Abs Immature Granulocytes: 0.02 10*3/uL (ref 0.00–0.07)
Basophils Absolute: 0 10*3/uL (ref 0.0–0.1)
Basophils Relative: 1 %
Eosinophils Absolute: 0 10*3/uL (ref 0.0–0.5)
Eosinophils Relative: 1 %
HCT: 25.5 % — ABNORMAL LOW (ref 36.0–46.0)
Hemoglobin: 8.2 g/dL — ABNORMAL LOW (ref 12.0–15.0)
Immature Granulocytes: 1 %
Lymphocytes Relative: 29 %
Lymphs Abs: 1.2 10*3/uL (ref 0.7–4.0)
MCH: 31.9 pg (ref 26.0–34.0)
MCHC: 32.2 g/dL (ref 30.0–36.0)
MCV: 99.2 fL (ref 80.0–100.0)
Monocytes Absolute: 0.8 10*3/uL (ref 0.1–1.0)
Monocytes Relative: 20 %
Neutro Abs: 2 10*3/uL (ref 1.7–7.7)
Neutrophils Relative %: 48 %
Platelet Count: 457 10*3/uL — ABNORMAL HIGH (ref 150–400)
RBC: 2.57 MIL/uL — ABNORMAL LOW (ref 3.87–5.11)
RDW: 21.2 % — ABNORMAL HIGH (ref 11.5–15.5)
WBC Count: 4.1 10*3/uL (ref 4.0–10.5)
nRBC: 0 % (ref 0.0–0.2)

## 2019-02-26 LAB — CMP (CANCER CENTER ONLY)
ALT: 27 U/L (ref 0–44)
AST: 36 U/L (ref 15–41)
Albumin: 2 g/dL — ABNORMAL LOW (ref 3.5–5.0)
Alkaline Phosphatase: 112 U/L (ref 38–126)
Anion gap: 10 (ref 5–15)
BUN: 10 mg/dL (ref 8–23)
CO2: 30 mmol/L (ref 22–32)
Calcium: 8.7 mg/dL — ABNORMAL LOW (ref 8.9–10.3)
Chloride: 92 mmol/L — ABNORMAL LOW (ref 98–111)
Creatinine: 0.6 mg/dL (ref 0.44–1.00)
GFR, Est AFR Am: >60 mL/min (ref >60)
GFR, Estimated: >60 mL/min (ref >60)
Glucose, Bld: 90 mg/dL (ref 70–99)
Potassium: 3 mmol/L — CL (ref 3.5–5.1)
Sodium: 132 mmol/L — ABNORMAL LOW (ref 135–145)
Total Bilirubin: 0.3 mg/dL (ref 0.3–1.2)
Total Protein: 6.4 g/dL — ABNORMAL LOW (ref 6.5–8.1)

## 2019-02-26 MED ORDER — SODIUM CHLORIDE 0.9 % IV SOLN
Freq: Once | INTRAVENOUS | Status: AC
  Administered 2019-02-26: 16:00:00 via INTRAVENOUS
  Filled 2019-02-26: qty 250

## 2019-02-26 MED ORDER — SODIUM CHLORIDE 0.9 % IV SOLN
1000.0000 mg/m2 | Freq: Once | INTRAVENOUS | Status: AC
  Administered 2019-02-26: 1520 mg via INTRAVENOUS
  Filled 2019-02-26: qty 39.98

## 2019-02-26 MED ORDER — HEPARIN SOD (PORK) LOCK FLUSH 100 UNIT/ML IV SOLN
500.0000 [IU] | Freq: Once | INTRAVENOUS | Status: AC | PRN
  Administered 2019-02-26: 17:00:00 500 [IU]
  Filled 2019-02-26: qty 5

## 2019-02-26 MED ORDER — PROCHLORPERAZINE MALEATE 10 MG PO TABS
10.0000 mg | ORAL_TABLET | Freq: Once | ORAL | Status: AC
  Administered 2019-02-26: 16:00:00 10 mg via ORAL

## 2019-02-26 MED ORDER — PROCHLORPERAZINE MALEATE 10 MG PO TABS
ORAL_TABLET | ORAL | Status: AC
  Filled 2019-02-26: qty 1

## 2019-02-26 MED ORDER — SODIUM CHLORIDE 0.9% FLUSH
10.0000 mL | INTRAVENOUS | Status: DC | PRN
  Administered 2019-02-26: 10 mL
  Filled 2019-02-26: qty 10

## 2019-02-26 MED ORDER — CEPHALEXIN 500 MG PO CAPS: 500.0000 mg | ORAL_CAPSULE | Freq: Four times a day (QID) | ORAL | 0 refills | Status: AC

## 2019-02-26 MED ORDER — POTASSIUM CHLORIDE CRYS ER 20 MEQ PO TBCR: 20.0000 meq | EXTENDED_RELEASE_TABLET | Freq: Two times a day (BID) | ORAL | 0 refills | Status: AC

## 2019-02-26 NOTE — Telephone Encounter (Addendum)
Left a message with the cancer center to discuss. Also reach out to patient and was unable to leave a message, no voicemail option.

## 2019-02-26 NOTE — Patient Instructions (Signed)
[facility] Discharge Instructions for Patients Receiving Chemotherapy  Today you received the following chemotherapy agents: gemzar   To help prevent nausea and vomiting after your treatment, we encourage you to take your nausea medication as directed.    If you develop nausea and vomiting that is not controlled by your nausea medication, call the clinic.   BELOW ARE SYMPTOMS THAT SHOULD BE REPORTED IMMEDIATELY:  *FEVER GREATER THAN 100.5 F  *CHILLS WITH OR WITHOUT FEVER  NAUSEA AND VOMITING THAT IS NOT CONTROLLED WITH YOUR NAUSEA MEDICATION  *UNUSUAL SHORTNESS OF BREATH  *UNUSUAL BRUISING OR BLEEDING  TENDERNESS IN MOUTH AND THROAT WITH OR WITHOUT PRESENCE OF ULCERS  *URINARY PROBLEMS  *BOWEL PROBLEMS  UNUSUAL RASH Items with * indicate a potential emergency and should be followed up as soon as possible.  Feel free to call the clinic should you have any questions or concerns. The clinic phone number is [phone].  Please show the [place] [facility] at check-in to the Emergency Department and triage nurse.  Coronavirus (COVID-19) Are you at risk?  Are you at risk for the Coronavirus (COVID-19)?  To be considered HIGH RISK for Coronavirus (COVID-19), you have to meet the following criteria:  . Traveled to [place], [place], [place], [place] or [place]; or in the [place] to [place], [place], [place], or [place]; and have fever, cough, and shortness of breath within the last 2 weeks of travel OR . Been in close contact with a person diagnosed with COVID-19 within the last 2 weeks and have fever, cough, and shortness of breath . IF YOU DO NOT MEET THESE CRITERIA, YOU ARE CONSIDERED LOW RISK FOR COVID-19.  What to do if you are HIGH RISK for COVID-19?  Marland Kitchen If you are having a medical emergency, call 911. . Seek medical care right away. Before you go to a doctor's office, urgent care or emergency department, call ahead and tell them about your  recent travel, contact with someone diagnosed with COVID-19, and your symptoms. You should receive instructions from your physician's office regarding next steps of care.  . When you arrive at healthcare provider, tell the healthcare staff immediately you have returned from visiting [place], [place], [place], [place] or [place]; or traveled in the [place] to [place], [place], [place], or [place]; in the last two weeks or you have been in close contact with a person diagnosed with COVID-19 in the last 2 weeks.   . Tell the health care staff about your symptoms: fever, cough and shortness of breath. . After you have been seen by a medical provider, you will be either: o Tested for (COVID-19) and discharged home on quarantine except to seek medical care if symptoms worsen, and asked to  - Stay home and avoid contact with others until you get your results (4-5 days)  - Avoid travel on public transportation if possible (such as bus, train, or airplane) or o Sent to the Emergency Department by EMS for evaluation, COVID-19 testing, and possible admission depending on your condition and test results.  What to do if you are LOW RISK for COVID-19?  Reduce your risk of any infection by using the same precautions used for avoiding the common cold or flu:  Marland Kitchen Wash your hands often with soap and warm water for at least 20 seconds.  If soap and water are not readily available, use an alcohol-based hand sanitizer with at least 60% alcohol.  . If coughing  or sneezing, cover your mouth and nose by coughing or sneezing into the elbow areas of your shirt or coat, into a tissue or into your sleeve (not your hands). . Avoid shaking hands with others and consider head nods or verbal greetings only. . Avoid touching your eyes, nose, or mouth with unwashed hands.  . Avoid close contact with people who are sick. . Avoid places or events with large numbers of people in one location, like concerts or sporting  events. . Carefully consider travel plans you have or are making. . If you are planning any travel outside or inside the [place], visit the CDC's Travelers' Health webpage for the latest health notices. . If you have some symptoms but not all symptoms, continue to monitor at home and seek medical attention if your symptoms worsen. . If you are having a medical emergency, call 911.   [place] / e-Visit: [email or web address]         [facility]: [facility]: [phone]                   [facility]: [phone]

## 2019-02-26 NOTE — Telephone Encounter (Signed)
Sent MyChart message to call the office.

## 2019-02-26 NOTE — Progress Notes (Signed)
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 She showed new liver lesions in June 2020.  The patient has a history of lupus as well as rheumatoid arthritis.  She is not a candidate for immunotherapy.  Biomarker Findings Tumor Mutational Burden - 18 Muts/Mb Microsatellite status - MS-Stable Genomic Findings For a complete list of the genes assayed, please refer to the Appendix. BRAF T241M NF1 W2075*, [id number]*3 BCORL1 [id number]*29STAG2 splice site 25621+3Y>QTERT promoter -124C>T 7 Disease relevant genes with no reportable alterations: ALK, EGFR, ERBB2, KRAS, MET, RET, ROS1   PDL1 expression 1%  PRIOR THERAPY: Systemic chemotherapy with carboplatin for AUC of 5, Alimta 500 mg/M2 and a Avastin 15 mg/KG every 3 weeks.  First dose October 09, 2018.  Status post 4 cycles.  Last dose was given on 01/09/2019 discontinued secondary to disease progression.  CURRENT THERAPY: Systemic chemotherapy with gemcitabine 1000 mg/M2 on days 1 and 8 every 3 weeks.  First dose 02/05/2019. Status post 1 cycle.   INTERVAL HISTORY: Donna [id number].[id number]. female returns to the clinic for a follow-up visit.  The patient expresses concern today with redness on her right foot as well as a single mouth ulcer on her lower gumline.  Regarding the ulceration in her mouth, the patient states this is been present for approximately 2 weeks. The ulcer is painless unless she is wearing her dentures.  To alleviate her symptoms, she has been using Listerine as well as Orajel.  The patient states that the ulceration makes it challenging to eat, however; she has not  lost any weight since her last visit.  She is currently in contact with our dietitian and drinks 1 Ensure as well as a instant breakfast daily. The patient denies any fevers or any other oral lesions or ulcerations.  Regarding the patient's erythema on her right foot, this is been present for about 1 week.  It is located on the dorsal aspect of her right foot.  The area is erythematous, swollen, and somewhat tender.  The patient denies any fever, trauma to this area, or drainage from the site. The patient has tried to use Neosporin; however, it has not helped and the area has been gradually increasing in size.  Regarding the patient's treatment for her lung cancer, the patient's most recent scan showed evidence of disease progression.  She was then subsequently switched to a new chemotherapy regimen with gemcitabine.  She is status post her first cycle of treatment and tolerated it fairly well except for fatigue and a diminished appetite. She denies any night sweats or weight loss.  She denies any chest pain or hemoptysis.  She endorses her baseline shortness of breath and cough.  She is on 2 L of oxygen at home.  She also takes albuterol and nebulizer treatments for her shortness of breath.  She one episode of shortness of breath following day 8 of cycle 1 but this was resolved with albuterol and Xanax.  She denies any nausea, vomiting, or diarrhea.  The patient has constipation at baseline for which she takes milk of magnesia.  She denies any headaches or visual changes.  She is here today for evaluation before starting  cycle #2.  MEDICAL HISTORY: Past Medical History:  Diagnosis Date  . COPD exacerbation ([facility] [address]) 09/01/2018  . COVID-19 virus infection 11/28/2018  . Diverticulitis   . Hypercholesteremia   . Hypertension   . Lupus ([place])   . Osteoporosis   . Tobacco use     ALLERGIES:  is allergic to codeine; cyclosporine; other; pregabalin; and zolpidem.  MEDICATIONS:  Current Outpatient Medications   Medication Sig Dispense Refill  . acetaminophen (TYLENOL) 500 MG tablet Take 1,000 mg by mouth every 6 (six) hours as needed for mild pain.     Marland Kitchen albuterol (PROVENTIL) (2.5 MG/3ML) 0.083% nebulizer solution Take 3 mLs (2.5 mg total) by nebulization every 4 (four) hours as needed for wheezing or shortness of breath. 180 mL 2  . ALPRAZolam (XANAX) 1 MG tablet Take 1 tablet (1 mg total) by mouth 2 (two) times daily as needed for anxiety. 20 tablet 0  . amLODipine (NORVASC) 10 MG tablet Take 10 mg by mouth daily.    Marland Kitchen aspirin EC 81 MG tablet Take 81 mg by mouth daily.    Marland Kitchen atorvastatin (LIPITOR) 40 MG tablet TAKE 1 TABLET BY MOUTH ONCE DAILY (Patient taking differently: Take 40 mg by mouth every evening. ) 90 tablet 3  . b complex vitamins tablet Take 2 tablets by mouth 2 (two) times daily.    . bisacodyl (DULCOLAX) 10 MG suppository Place 10 mg rectally as needed for moderate constipation.    . bismuth subsalicylate (PEPTO BISMOL) 262 MG/15ML suspension Take 30 mLs by mouth every 6 (six) hours as needed.    . Calcium Carb-Cholecalciferol (CALCIUM 600+D) 600-800 MG-UNIT TABS Take 1 tablet by mouth 2 (two) times a day.    . carboxymethylcellulose (REFRESH TEARS) 0.5 % SOLN Place 1 drop into both eyes daily as needed (dry eyes).     . cetirizine (ZYRTEC) 10 MG tablet Take 10 mg by mouth daily as needed for allergies.    . Coenzyme Q10 (COQ10) 100 MG CAPS Take 100 mg by mouth daily.    Marland Kitchen dextromethorphan-guaiFENesin (MUCINEX DM) 30-600 MG 12hr tablet Take 1 tablet by mouth 2 (two) times daily.    Marland Kitchen docusate sodium (COLACE) 100 MG capsule Take 100 mg by mouth 2 (two) times daily.    . famotidine (PEPCID) 10 MG tablet Take 10 mg by mouth 2 (two) times daily.    . feeding supplement, ENSURE ENLIVE, (ENSURE ENLIVE) LIQD Take 237 mLs by mouth 2 (two) times daily between meals. 237 mL 12  . Ferrous Sulfate (SLOW FE) 142 (45 Fe) MG TBCR Take 1 tablet by mouth daily.    . flecainide (TAMBOCOR) 50 MG tablet  Take 1 tablet (50 mg total) by mouth every 12 (twelve) hours. 60 tablet 0  . fluticasone (FLONASE) 50 MCG/ACT nasal spray Place 2 sprays into both nostrils daily. 16 g 2  . folic acid (FOLVITE) 1 MG tablet Take 1 tablet (1 mg total) by mouth daily. 30 tablet 4  . gabapentin (NEURONTIN) 100 MG capsule Take 100 mg by mouth 3 (three) times daily.     . hydrochlorothiazide (HYDRODIURIL) 50 MG tablet Take 50 mg by mouth daily.    Marland Kitchen HYDROcodone-Acetaminophen (VICODIN) 5-300 MG TABS Take 1 tablet by mouth every 6 (six) hours as needed (severe pain).    . hydroxychloroquine (PLAQUENIL) 200 MG tablet Take 1 tablet by mouth daily.    Marland Kitchen ipratropium (ATROVENT HFA) 17 MCG/ACT inhaler Inhale 2 puffs into the lungs 3 (three) times daily. 1  Inhaler 12  . Krill Oil 500 MG CAPS Take 500 mg by mouth daily.    . Melatonin 3 MG TABS Take 3 mg by mouth at bedtime.    . methotrexate (RHEUMATREX) 2.5 MG tablet Take 6 tablets (15 mg total) by mouth every Monday. Hold it for one week and talk to your PCP before resuming it. 4 tablet 0  . metoprolol tartrate (LOPRESSOR) 100 MG tablet Take 1 tablet (100 mg total) by mouth 2 (two) times daily. 60 tablet 0  . Multiple Vitamins-Minerals (MULTIVITAMIN ADULT PO) Take 1 tablet by mouth daily.     . Omega-3 Fatty Acids (FISH OIL) 1200 MG CAPS Take 1 capsule by mouth 2 (two) times a day.    . ondansetron (ZOFRAN) 8 MG tablet Take 1 tablet (8 mg total) by mouth every 8 (eight) hours as needed for nausea or vomiting. 20 tablet 0  . potassium chloride 20 MEQ TBCR Take 40 mEq by mouth daily. 14 tablet 0  . PROAIR HFA 108 (90 Base) MCG/ACT inhaler Inhale 2 puffs into the lungs every 6 (six) hours as needed for wheezing or shortness of breath. 3 Inhaler 2  . Probiotic Product (PROBIOTIC DAILY) CAPS Take 1 capsule by mouth 2 (two) times daily.    . prochlorperazine (COMPAZINE) 10 MG tablet Take 1 tablet (10 mg total) by mouth every 6 (six) hours as needed for nausea or vomiting. 30 tablet 1   . rivaroxaban (XARELTO) 10 MG TABS tablet Take 10 mg by mouth daily. For A-fib    . Simethicone (GAS RELIEF 80 PO) Take by mouth.    . umeclidinium-vilanterol (ANORO ELLIPTA) 62.5-25 MCG/INH AEPB Inhale 1 puff into the lungs daily. 60 each 0  . Vitamin D, Cholecalciferol, 25 MCG (1000 UT) CAPS Take 1 capsule by mouth daily.    Marland Kitchen apixaban (ELIQUIS) 2.5 MG TABS tablet Take 1 tablet (2.5 mg total) by mouth 2 (two) times daily. (Patient not taking: Reported on 01/15/2019) 60 tablet 0  . cephALEXin (KEFLEX) 500 MG capsule Take 1 capsule (500 mg total) by mouth 4 (four) times daily. 26 capsule 0  . furosemide (LASIX) 20 MG tablet Take 1 tablet (20 mg total) by mouth 2 (two) times daily. (Patient not taking: Reported on 01/15/2019) 60 tablet 0  . loperamide (LOPERAMIDE A-D) 2 MG tablet Take 2 mg by mouth daily as needed for diarrhea or loose stools.    . pantoprazole (PROTONIX) 40 MG tablet Take 1 tablet (40 mg total) by mouth daily at 6 (six) AM. (Patient not taking: Reported on 01/15/2019) 30 tablet 0  . polyethylene glycol (MIRALAX / GLYCOLAX) packet Take 17 g by mouth daily as needed for mild constipation. (Patient not taking: Reported on 01/15/2019) 14 each 0  . potassium chloride SA (K-DUR) 20 MEQ tablet Take 1 tablet (20 mEq total) by mouth 2 (two) times daily. 20 tablet 0   No current facility-administered medications for this visit.    Facility-Administered Medications Ordered in Other Visits  Medication Dose Route Frequency Provider Last Rate Last Dose  . gemcitabine (GEMZAR) 1,520 mg in sodium chloride 0.9 % 250 mL chemo infusion  1,000 mg/m2 (Treatment Plan Recorded) Intravenous Once Curt Bears, MD 580 mL/hr at 02/26/19 1625 1,520 mg at 02/26/19 1625  . heparin lock flush 100 unit/mL  500 Units Intracatheter Once PRN Curt Bears, MD      . sodium chloride flush (NS) 0.9 % injection 10 mL  10 mL Intracatheter PRN Curt Bears, MD  SURGICAL HISTORY:  Past Surgical History:   Procedure Laterality Date  . ABDOMINAL HYSTERECTOMY  12/1969  . APPENDECTOMY  1957  . CATARACT EXTRACTION    . CHOLECYSTECTOMY  1997  . IR IMAGING GUIDED PORT INSERTION  10/16/2018  . LAPAROSCOPIC SIGMOID COLECTOMY  2013   secondary to diverticulitis  . spit seed removal    . TONSILLECTOMY  1945    REVIEW OF SYSTEMS:   Review of Systems  Constitutional: Positive for fatigue and diminished appetite. Negative for chills, fever and unexpected weight change.  HENT: Positive for a single ulceration on her right lower gumline. Negative for nosebleeds, sore throat and trouble swallowing.   Eyes: Negative for eye problems and icterus.  Respiratory: Positive for baseline shortness of breath. Negative for cough, hemoptysis, and wheezing.   Cardiovascular: Positive for lower extremity swelling. Negative for chest pain Gastrointestinal: Positive for constipation. Negative for abdominal pain, diarrhea, nausea and vomiting.  Genitourinary: Negative for bladder incontinence, difficulty urinating, dysuria, frequency and hematuria.   Musculoskeletal: Negative for back pain, gait problem, neck pain and neck stiffness.  Skin: Positive for erythema on the dorsal aspect of her right foot. Negative for itching Neurological: Negative for dizziness, extremity weakness, gait problem, headaches, light-headedness and seizures.  Hematological: Negative for adenopathy. Does not bruise/bleed easily.  Psychiatric/Behavioral: Negative for confusion, depression and sleep disturbance. The patient is not nervous/anxious.     PHYSICAL EXAMINATION:  Blood pressure 123/63, pulse 91, temperature 98.7 F (37.1 C), temperature source Oral, resp. rate 18, height '5\' 2"'  (1.575 m), weight 123 lb 6.4 oz (56 kg), SpO2 100 %.  ECOG PERFORMANCE STATUS: 2 - Symptomatic, <50% confined to bed  Physical Exam  Constitutional: Oriented to person, place, and time and thin appearing female and in no distress.  HENT:  Head: Normocephalic  and atraumatic.  Mouth/Throat: Single ulceration on her right lower gumline. Oropharynx is clear and moist. No oropharyngeal exudate.  Eyes: Conjunctivae are normal. Right eye exhibits no discharge. Left eye exhibits no discharge. No scleral icterus.  Neck: Normal range of motion. Neck supple.  Cardiovascular: Irregular rhythm. Normal rate, normal heart sounds and intact distal pulses.   Pulmonary/Chest: Decreased breath sounds in left middle/lower lung field. Effort normal and breath sounds normal in all other lung fields. No respiratory distress. No wheezes. No rales.  Abdominal: Soft. Bowel sounds are normal. Exhibits no distension and no mass. There is no tenderness.  Musculoskeletal: Bilateral lower extremity edema and dry skin. Normal range of motion.  Lymphadenopathy:    No cervical adenopathy.  Neurological: Alert and oriented to person, place, and time. Exhibits normal muscle tone. Gait normal. Coordination normal.  Skin: erythema and blistering on the dorsal aspect of her right foot. Mildly swollen and tender. Skin is warm and dry. Not diaphoretic. No pallor.  Psychiatric: Mood, memory and judgment normal.  Vitals reviewed.  LABORATORY DATA: Lab Results  Component Value Date   WBC 4.1 02/26/2019   HGB 8.2 (L) 02/26/2019   HCT 25.5 (L) 02/26/2019   MCV 99.2 02/26/2019   PLT 457 (H) 02/26/2019      Chemistry      Component Value Date/Time   NA 132 (L) 02/26/2019 1354   NA 136 05/14/2018 1225   NA 134 (L) 04/23/2012 0640   K 3.0 (LL) 02/26/2019 1354   K 2.8 (L) 04/23/2012 0640   CL 92 (L) 02/26/2019 1354   CL 91 (L) 04/23/2012 0640   CO2 30 02/26/2019 1354   CO2 32 04/23/2012  0640   BUN 10 02/26/2019 1354   BUN 8 05/14/2018 1225   BUN 13 04/23/2012 0640   CREATININE 0.60 02/26/2019 1354   CREATININE 0.72 07/04/2017 1045      Component Value Date/Time   CALCIUM 8.7 (L) 02/26/2019 1354   CALCIUM 8.3 (L) 04/23/2012 0640   ALKPHOS 112 02/26/2019 1354   ALKPHOS 54  04/05/2012 0957   AST 36 02/26/2019 1354   ALT 27 02/26/2019 1354   ALT 23 04/05/2012 0957   BILITOT 0.3 02/26/2019 1354       RADIOGRAPHIC STUDIES:  No results found.   ASSESSMENT/PLAN:  This is a very pleasant 78 year old Caucasian female diagnosed with stage IV (T1b, 2, and 1A) non-small cell lung cancer, highly suspicious for adenocarcinoma.  She presented with a left upper lobe lung nodule in addition to left hilar and mediastinal lymphadenopathy and a malignant left pleural effusion and pleural-based metastasis.  She was diagnosed in January 2020.  She went chemotherapy with carboplatin, Alimta, and Avastin.  She is status post 4 cycles.  This was discontinued secondary to evidence of disease progression.   She is currently undergoing treatment with single agent gemcitabine 1000 mg/m on days 1 and 8 every 3 weeks.  She is status post 1 cycle and tolerated fairly well with the exception of fatigue and a decreased appetite.  The patient is not a good candidate for second line treatment with docetaxel and Cyramza due to her multiple comorbidities.  The patient was seen with Dr. Julien Nordmann today.  We recommend that she proceed with cycle #2 today as scheduled.  Labs were reviewed with the patient.  Her labs showed hyperkalemia with a potassium of 3.0.  Her hemoglobin is also 8.2 today.  I sent prescription for supplemental potassium to the patient's pharmacy.  Additionally I will add an order for a sample to blood bank on the patient's routine labs next week.  We will continue to monitor the patient's hemoglobin closely and arrange for a blood transfusion if indicated.  I will see the patient back for follow-up visit in 3 weeks for evaluation before starting cycle #3.  I sent a prescription for Keflex 500 mg QID for 7 days to the patient's pharmacy.  The patient was advised to call us immediately if she develops new or worsening symptoms such as fever, chills, increased erythema,  tenderness, or pain.   For the patient's bilateral lower extremity swelling, patient states this worsens throughout the day but is improved in the morning. I advised to elevate her extremities as well as use compression stockings.   For the single oral lesion in the patient's mouth, she was advised that she may continue using oragel.  Additionally she was encouraged to use salt water rinses.   The patient is followed closely by rheumatology for her history of rheumatoid arthritis.  She is currently taking methotrexate.  The patient states that her rheumatologist requires routine labs in order to refill her methotrexate.  I encouraged the patient to reach out to her rheumatologist to determine which labs are required.  The patient will need to bring in an order for the lab in order to have that blood work performed.  The patient was hospitalized in April 2020 for new onset atrial fibrillation.  On exam today the patient's pulse was within normal limits at 91; however, the rhythm is irregular.  Per chart review from her hospital discharge summary, the patient was supposed to follow up with cardiology for management of CHF  and atrial fibrillation. The patient is unsure if she has followed up. Additionally, she is unsure if she is still taking the recommended medications for her afib and CHF since being discharged. We will reach out to their office to see if the patient needs to follow up with them.   The patient was strongly encouraged to evaluate her medication list at home and to please bring an updated list at her next visit.   The patient was advised to call immediately if she has any concerning symptoms in the interval. The patient voices understanding of current disease status and treatment options and is in agreement with the current care plan. All questions were answered. The patient knows to call the clinic with any problems, questions or concerns. We can certainly see the patient much sooner if  necessary  Orders Placed This Encounter  Procedures  . Sample to Blood Bank    Standing Status:   Future    Standing Expiration Date:   02/26/2020     Tobe Sos Donna Upson, PA-C 02/26/19  ADDENDUM: Hematology/Oncology Attending: I had a face-to-face encounter with the patient today.  I recommended her care plan.  This is a very pleasant 78 years old white female with metastatic non-small cell lung cancer, adenocarcinoma with no actionable mutations.  Status post initial treatment with carboplatin, Alimta and Avastin status post 4 cycles discontinued secondary to disease progression.  The patient is not a candidate for treatment with immunotherapy secondary to history of lupus erythematosus.  She was a started on second line treatment with single agent gemcitabine status post 1 cycle.  She tolerated the first cycle of her treatment well.  She complained of sore mouth as well as inflammation of her left leg. I recommended for the patient to use salt water and mouthwash for the mouth soreness secondary to her denture. For the swelling and inflammation of the left foot, will start the patient on Keflex 500 mg p.o. every 6 hours for 7 days for suspicious cellulitis. For the history of atrial fibrillation as well as congestive heart failure, we will arrange for the patient to see her cardiologist for further evaluation of this condition. For the hypokalemia, we will give her replacement of the potassium chloride. She will come back for follow-up visit in 3 weeks for evaluation before the next cycle of her treatment. The patient was advised to call immediately if she has any concerning symptoms in the interval.  Disclaimer: This note was dictated with voice recognition software. Similar sounding words can inadvertently be transcribed and may be missed upon review. Eilleen Kempf, MD 02/26/19

## 2019-02-26 NOTE — Telephone Encounter (Signed)
New Message       Tammy from the [facility] says the Pt was seen and is in AFIB, and they are wondering if the pt has been seen in our office for this as a hospital f/u from March She shows an appt 04/30 but doesn't see any office notes    Please call

## 2019-02-26 NOTE — Telephone Encounter (Signed)
Received call report from [facility].  "Today's K+ = 3.0."  Secure Chat sent with results.  Received "read" confirmation.

## 2019-02-26 NOTE — Telephone Encounter (Signed)
Spoke with [facility] from the Cancer center, she stated the patient needs to be seen soon since she is most likely in afib. Called both home and cell numbers, no answer and no voicemail option. Will try again at another time.

## 2019-02-26 NOTE — Progress Notes (Signed)
Cassie Hellingotter, PA informed of Potassium level of 3.0. Cassie to send new Rx to patient's pharmacy. Received OK to treat.

## 2019-02-26 NOTE — Telephone Encounter (Signed)
No 6/30 los.

## 2019-02-26 NOTE — Telephone Encounter (Signed)
reroute

## 2019-02-27 ENCOUNTER — Telehealth: Payer: Self-pay | Admitting: Pulmonary Disease

## 2019-02-27 ENCOUNTER — Telehealth: Payer: Self-pay | Admitting: *Deleted

## 2019-02-27 NOTE — Telephone Encounter (Signed)
Called and spoke with Herbie Baltimore (D[facility]).  Herbie Baltimore stated Patient was scheduled to come in to office for OV today with a NP.  Herbie Baltimore stated they cancelled OV, because Patient is currently having chemo treatments. Herbie Baltimore stated she is staying at home as much as possible,because of her immune system.   Explained office is offering my chart and telephone visits with providers.  Herbie Baltimore stated a telephone visit would be good.  Offered to schedule telephone visit with NP.  Herbie Baltimore stated he would call back to schedule telephone visit. Nothing further at this time.

## 2019-02-27 NOTE — Telephone Encounter (Signed)
Oncology Nurse Navigator Documentation  Oncology Nurse Navigator Flowsheets 02/27/2019  Navigator Location [facility]  Referral Date to RadOnc/MedOnc -  Navigator Encounter Type Telephone/I received a call from Mr. Esch. He called to complain that he did not understand what was going on with cardiology calling.  I was unsure of what the issue was and spoke with both Cassie, Dr. Worthy Flank PA and his nurse Stanton Kidney.  Apparently, patient was seen in the hospital by cardiology due to her CHF and A-fib but has not had a follow up.  Cardiology office with Dr. Johnsie Cancel has been trying to call him.  I explained this to Mr. Giuffre but he hung up on me.  I called back and spoke with Ms Schwimmer.  I explained that she needs to follow up with cardiology today and be seen.  I explained she needs to be on medications to help her heart be stronger and prevent clots and strokes.  I looked up the phone number for Dr. Johnsie Cancel but she states Mr. Lovan has it and she will call.  I explained we are here to help and want her to feel the best she can.    Telephone Incoming Call;Outgoing Call  Abnormal Finding Date -  Confirmed Diagnosis Date -  Treatment Initiated Date -  Treatment Phase Treatment  Barriers/Navigation Needs Education  Education Other  Interventions Education  Coordination of Care -  Education Method Verbal  Acuity Level 2  Time Spent with Patient 55

## 2019-02-27 NOTE — Telephone Encounter (Signed)
Spoke with the patient, she gave the phone to her caregiver. He stated he did not know anything about follow up with cardiology. I informed him that the Cancer center requested I call and schedule an appointment. He said he was going to call her PCP and oncologist before he scheduled any appointments. I gave him the office number and he will call the office once he is ready to schedule.

## 2019-03-05 ENCOUNTER — Other Ambulatory Visit: Payer: Self-pay | Admitting: Medical Oncology

## 2019-03-05 ENCOUNTER — Inpatient Hospital Stay: Payer: Medicare Other | Attending: Internal Medicine

## 2019-03-05 ENCOUNTER — Inpatient Hospital Stay: Payer: Medicare Other

## 2019-03-05 ENCOUNTER — Other Ambulatory Visit: Payer: Self-pay

## 2019-03-05 VITALS — BP 145/79 | HR 97 | Temp 98.9°F | Resp 18

## 2019-03-05 DIAGNOSIS — Z5111 Encounter for antineoplastic chemotherapy: Secondary | ICD-10-CM | POA: Insufficient documentation

## 2019-03-05 DIAGNOSIS — Z95828 Presence of other vascular implants and grafts: Secondary | ICD-10-CM

## 2019-03-05 DIAGNOSIS — C3492 Malignant neoplasm of unspecified part of left bronchus or lung: Secondary | ICD-10-CM

## 2019-03-05 DIAGNOSIS — Z79899 Other long term (current) drug therapy: Secondary | ICD-10-CM | POA: Diagnosis not present

## 2019-03-05 DIAGNOSIS — M0579 Rheumatoid arthritis with rheumatoid factor of multiple sites without organ or systems involvement: Secondary | ICD-10-CM | POA: Diagnosis not present

## 2019-03-05 DIAGNOSIS — D649 Anemia, unspecified: Secondary | ICD-10-CM

## 2019-03-05 DIAGNOSIS — J449 Chronic obstructive pulmonary disease, unspecified: Secondary | ICD-10-CM | POA: Diagnosis not present

## 2019-03-05 DIAGNOSIS — T451X5A Adverse effect of antineoplastic and immunosuppressive drugs, initial encounter: Secondary | ICD-10-CM

## 2019-03-05 DIAGNOSIS — D6481 Anemia due to antineoplastic chemotherapy: Secondary | ICD-10-CM

## 2019-03-05 DIAGNOSIS — C3412 Malignant neoplasm of upper lobe, left bronchus or lung: Secondary | ICD-10-CM | POA: Diagnosis not present

## 2019-03-05 DIAGNOSIS — J9611 Chronic respiratory failure with hypoxia: Secondary | ICD-10-CM | POA: Diagnosis not present

## 2019-03-05 LAB — CBC WITH DIFFERENTIAL (CANCER CENTER ONLY)
Abs Immature Granulocytes: 0.07 10*3/uL (ref 0.00–0.07)
Basophils Absolute: 0.1 10*3/uL (ref 0.0–0.1)
Basophils Relative: 1 %
Eosinophils Absolute: 0 10*3/uL (ref 0.0–0.5)
Eosinophils Relative: 0 %
HCT: 23.8 % — ABNORMAL LOW (ref 36.0–46.0)
Hemoglobin: 7.6 g/dL — ABNORMAL LOW (ref 12.0–15.0)
Immature Granulocytes: 1 %
Lymphocytes Relative: 28 %
Lymphs Abs: 1.5 10*3/uL (ref 0.7–4.0)
MCH: 31.5 pg (ref 26.0–34.0)
MCHC: 31.9 g/dL (ref 30.0–36.0)
MCV: 98.8 fL (ref 80.0–100.0)
Monocytes Absolute: 0.8 10*3/uL (ref 0.1–1.0)
Monocytes Relative: 16 %
Neutro Abs: 2.8 10*3/uL (ref 1.7–7.7)
Neutrophils Relative %: 54 %
Platelet Count: 327 10*3/uL (ref 150–400)
RBC: 2.41 MIL/uL — ABNORMAL LOW (ref 3.87–5.11)
RDW: 19.9 % — ABNORMAL HIGH (ref 11.5–15.5)
WBC Count: 5.3 10*3/uL (ref 4.0–10.5)
nRBC: 0.9 % — ABNORMAL HIGH (ref 0.0–0.2)

## 2019-03-05 LAB — CMP (CANCER CENTER ONLY)
ALT: 64 U/L — ABNORMAL HIGH (ref 0–44)
AST: 53 U/L — ABNORMAL HIGH (ref 15–41)
Albumin: 1.8 g/dL — ABNORMAL LOW (ref 3.5–5.0)
Alkaline Phosphatase: 132 U/L — ABNORMAL HIGH (ref 38–126)
Anion gap: 10 (ref 5–15)
BUN: 8 mg/dL (ref 8–23)
CO2: 25 mmol/L (ref 22–32)
Calcium: 8.4 mg/dL — ABNORMAL LOW (ref 8.9–10.3)
Chloride: 98 mmol/L (ref 98–111)
Creatinine: 0.56 mg/dL (ref 0.44–1.00)
GFR, Est AFR Am: >60 mL/min (ref >60)
GFR, Estimated: >60 mL/min (ref >60)
Glucose, Bld: 89 mg/dL (ref 70–99)
Potassium: 3.8 mmol/L (ref 3.5–5.1)
Sodium: 133 mmol/L — ABNORMAL LOW (ref 135–145)
Total Bilirubin: 0.3 mg/dL (ref 0.3–1.2)
Total Protein: 6.4 g/dL — ABNORMAL LOW (ref 6.5–8.1)

## 2019-03-05 LAB — PREPARE RBC (CROSSMATCH)

## 2019-03-05 LAB — SAMPLE TO BLOOD BANK

## 2019-03-05 MED ORDER — HEPARIN SOD (PORK) LOCK FLUSH 100 UNIT/ML IV SOLN
500.0000 [IU] | Freq: Once | INTRAVENOUS | Status: AC | PRN
  Administered 2019-03-05: 500 [IU]
  Filled 2019-03-05: qty 5

## 2019-03-05 MED ORDER — PROCHLORPERAZINE MALEATE 10 MG PO TABS
10.0000 mg | ORAL_TABLET | Freq: Once | ORAL | Status: AC
  Administered 2019-03-05: 10 mg via ORAL

## 2019-03-05 MED ORDER — SODIUM CHLORIDE 0.9% FLUSH
10.0000 mL | INTRAVENOUS | Status: DC | PRN
  Administered 2019-03-05: 10 mL
  Filled 2019-03-05: qty 10

## 2019-03-05 MED ORDER — SODIUM CHLORIDE 0.9 % IV SOLN
1000.0000 mg/m2 | Freq: Once | INTRAVENOUS | Status: AC
  Administered 2019-03-05: 16:00:00 1520 mg via INTRAVENOUS
  Filled 2019-03-05: qty 39.98

## 2019-03-05 MED ORDER — PROCHLORPERAZINE MALEATE 10 MG PO TABS
ORAL_TABLET | ORAL | Status: AC
  Filled 2019-03-05: qty 1

## 2019-03-05 MED ORDER — SODIUM CHLORIDE 0.9 % IV SOLN
Freq: Once | INTRAVENOUS | Status: AC
  Administered 2019-03-05: 15:00:00 via INTRAVENOUS
  Filled 2019-03-05: qty 250

## 2019-03-05 NOTE — Progress Notes (Signed)
Patient aware of 9am blood transfusion appointment at [facility] tomorrow and not to remove blue blood bank bracelet.

## 2019-03-05 NOTE — Progress Notes (Signed)
Per Dr. Julien Nordmann it is ok to treat with Gemzar today with hemoglobin of 7.6

## 2019-03-05 NOTE — Patient Instructions (Signed)
[facility] °Discharge Instructions for Patients Receiving Chemotherapy ° °Today you received the following chemotherapy agents Gemzar ° °To help prevent nausea and vomiting after your treatment, we encourage you to take your nausea medication as directed. °  °If you develop nausea and vomiting that is not controlled by your nausea medication, call the clinic.  ° °BELOW ARE SYMPTOMS THAT SHOULD BE REPORTED IMMEDIATELY: °· *FEVER GREATER THAN 100.5 F °· *CHILLS WITH OR WITHOUT FEVER °· NAUSEA AND VOMITING THAT IS NOT CONTROLLED WITH YOUR NAUSEA MEDICATION °· *UNUSUAL SHORTNESS OF BREATH °· *UNUSUAL BRUISING OR BLEEDING °· TENDERNESS IN MOUTH AND THROAT WITH OR WITHOUT PRESENCE OF ULCERS °· *URINARY PROBLEMS °· *BOWEL PROBLEMS °· UNUSUAL RASH °Items with * indicate a potential emergency and should be followed up as soon as possible. ° °Feel free to call the clinic should you have any questions or concerns. The clinic phone number is [phone]. ° °Please show the CHEMO ALERT CARD at check-in to the Emergency Department and triage nurse. ° ° °

## 2019-03-06 ENCOUNTER — Inpatient Hospital Stay (HOSPITAL_BASED_OUTPATIENT_CLINIC_OR_DEPARTMENT_OTHER): Payer: Medicare Other | Admitting: Medical

## 2019-03-06 ENCOUNTER — Other Ambulatory Visit: Payer: Self-pay

## 2019-03-06 ENCOUNTER — Telehealth: Payer: Self-pay | Admitting: Medical

## 2019-03-06 ENCOUNTER — Inpatient Hospital Stay: Payer: Medicare Other

## 2019-03-06 DIAGNOSIS — Z5111 Encounter for antineoplastic chemotherapy: Secondary | ICD-10-CM | POA: Diagnosis not present

## 2019-03-06 DIAGNOSIS — D649 Anemia, unspecified: Secondary | ICD-10-CM

## 2019-03-06 DIAGNOSIS — R600 Localized edema: Secondary | ICD-10-CM

## 2019-03-06 DIAGNOSIS — C3412 Malignant neoplasm of upper lobe, left bronchus or lung: Secondary | ICD-10-CM | POA: Diagnosis not present

## 2019-03-06 DIAGNOSIS — C3492 Malignant neoplasm of unspecified part of left bronchus or lung: Secondary | ICD-10-CM

## 2019-03-06 LAB — ABO/RH: ABO/RH(D): O POS

## 2019-03-06 MED ORDER — HEPARIN SOD (PORK) LOCK FLUSH 100 UNIT/ML IV SOLN
500.0000 [IU] | Freq: Every day | INTRAVENOUS | Status: AC | PRN
  Administered 2019-03-06: 500 [IU]
  Filled 2019-03-06: qty 5

## 2019-03-06 MED ORDER — SODIUM CHLORIDE 0.9% FLUSH
10.0000 mL | INTRAVENOUS | Status: AC | PRN
  Administered 2019-03-06: 10 mL
  Filled 2019-03-06: qty 10

## 2019-03-06 MED ORDER — DIPHENHYDRAMINE HCL 25 MG PO CAPS
25.0000 mg | ORAL_CAPSULE | Freq: Once | ORAL | Status: AC
  Administered 2019-03-06: 09:00:00 25 mg via ORAL

## 2019-03-06 MED ORDER — ACETAMINOPHEN 325 MG PO TABS
650.0000 mg | ORAL_TABLET | Freq: Once | ORAL | Status: AC
  Administered 2019-03-06: 650 mg via ORAL

## 2019-03-06 MED ORDER — ACETAMINOPHEN 325 MG PO TABS
ORAL_TABLET | ORAL | Status: AC
  Filled 2019-03-06: qty 2

## 2019-03-06 MED ORDER — SODIUM CHLORIDE 0.9% IV SOLUTION
250.0000 mL | Freq: Once | INTRAVENOUS | Status: AC
  Administered 2019-03-06: 250 mL via INTRAVENOUS
  Filled 2019-03-06: qty 250

## 2019-03-06 MED ORDER — DIPHENHYDRAMINE HCL 25 MG PO CAPS
ORAL_CAPSULE | ORAL | Status: AC
  Filled 2019-03-06: qty 1

## 2019-03-06 NOTE — Telephone Encounter (Signed)
No los per 7/8. °

## 2019-03-06 NOTE — Progress Notes (Signed)
Pt seen during blood transfusion in [facility] by PA Lucianne Lei to assess existing recently opened blister on top of R foot.  Hx cellulitis and bilat lower edema.  No bleeding, drainage, rash, recent injury, or fever.  Pt reports tenderness at site.  Wound redressed by PA Lucianne Lei.  Upon d/c pt provided with written instructions for wound care and supplies for redressing.  Pt verbalized understanding of instructions and to f/u as needed with any questions or concerns.

## 2019-03-06 NOTE — Patient Instructions (Signed)
Blood Transfusion, Adult, Care After This sheet gives you information about how to care for yourself after your procedure. Your doctor may also give you more specific instructions. If you have problems or questions, contact your doctor. Follow these instructions at home:   Take over-the-counter and prescription medicines only as told by your doctor.  Go back to your normal activities as told by your doctor.  Follow instructions from your doctor about how to take care of the area where an IV tube was put into your vein (insertion site). Make sure you: ? Wash your hands with soap and water before you change your bandage (dressing). If there is no soap and water, use hand sanitizer. ? Change your bandage as told by your doctor.  Check your IV insertion site every day for signs of infection. Check for: ? More redness, swelling, or pain. ? More fluid or blood. ? Warmth. ? Pus or a bad smell. Contact a doctor if:  You have more redness, swelling, or pain around the IV insertion site.  You have more fluid or blood coming from the IV insertion site.  Your IV insertion site feels warm to the touch.  You have pus or a bad smell coming from the IV insertion site.  Your pee (urine) turns pink, red, or brown.  You feel weak after doing your normal activities. Get help right away if:  You have signs of a serious allergic or body defense (immune) system reaction, including: ? Itchiness. ? Hives. ? Trouble breathing. ? Anxiety. ? Pain in your chest or lower back. ? Fever, flushing, and chills. ? Fast pulse. ? Rash. ? Watery poop (diarrhea). ? Throwing up (vomiting). ? Dark pee. ? Serious headache. ? Dizziness. ? Stiff neck. ? Yellow color in your face or the white parts of your eyes (jaundice). Summary  After a blood transfusion, return to your normal activities as told by your doctor.  Every day, check for signs of infection where the IV tube was put into your vein.  Some  signs of infection are warm skin, more redness and pain, more fluid or blood, and pus or a bad smell where the needle went in.  Contact your doctor if you feel weak or have any unusual symptoms. This information is not intended to replace advice given to you by your health care provider. Make sure you discuss any questions you have with your health care provider. Document Released: 09/05/2014 Document Revised: 12/20/2017 Document Reviewed: 04/08/2016 Elsevier Patient Education  2020 [employer]  

## 2019-03-06 NOTE — Patient Instructions (Signed)

## 2019-03-06 NOTE — Progress Notes (Signed)
Pt received 2 units PRBCs today, tolerated well.  Ate and drank during transfusion.  VSS.  Reports feeling much better at end of tx.  Pt verbalized understanding of d/c instructions including what to f/u for in terms of reactions and side effects.  WC to exit with belongings and oxygen tank/tubing.  Placed in friend/caregiver's car and switched to her home oxygen.

## 2019-03-07 LAB — TYPE AND SCREEN
ABO/RH(D): O POS
Antibody Screen: NEGATIVE
Unit division: 0
Unit division: 0

## 2019-03-07 LAB — BPAM RBC
Blood Product Expiration Date: 202008052359
Blood Product Expiration Date: 202008052359
ISSUE DATE / TIME: 202007080931
ISSUE DATE / TIME: 202007080931
Unit Type and Rh: 5100
Unit Type and Rh: 5100

## 2019-03-10 DIAGNOSIS — C3492 Malignant neoplasm of unspecified part of left bronchus or lung: Secondary | ICD-10-CM | POA: Diagnosis not present

## 2019-03-10 DIAGNOSIS — J449 Chronic obstructive pulmonary disease, unspecified: Secondary | ICD-10-CM | POA: Diagnosis not present

## 2019-03-10 DIAGNOSIS — J9611 Chronic respiratory failure with hypoxia: Secondary | ICD-10-CM | POA: Diagnosis not present

## 2019-03-10 NOTE — Progress Notes (Signed)
The patient was seen while she was transfused with 2 units of PRBC's today. She has bilateral lower extremity edema. She had a large fluid filled blister on the dorsal right lower extremity which ruptured. No erythema, increased warmth, or exudate was noted. The area was dressed with a Telfa dressing. Signs and symptoms of infection were discussed with the patient. She was told to elevate her lower extremities as much as possible and return as needed. This was discussed with Dr. Julien Nordmann.   Sandi Mealy, MHS, PA-C Physician Assistant

## 2019-03-13 ENCOUNTER — Telehealth: Payer: Self-pay

## 2019-03-13 NOTE — Telephone Encounter (Signed)
Pt needs refill on Xanax 1mg .  She states she take 1 tablet twice a day.  Pt uses [facility].    Please advise.   Thanks,   -Mickel Baas

## 2019-03-14 ENCOUNTER — Other Ambulatory Visit: Payer: Self-pay | Admitting: Family Medicine

## 2019-03-14 DIAGNOSIS — F409 Phobic anxiety disorder, unspecified: Secondary | ICD-10-CM

## 2019-03-14 MED ORDER — ALPRAZOLAM 1 MG PO TABS: 1.0000 mg | ORAL_TABLET | Freq: Two times a day (BID) | ORAL | 0 refills | Status: AC | PRN

## 2019-03-14 MED ORDER — ALPRAZOLAM 1 MG PO TABS: 1.0000 mg | ORAL_TABLET | Freq: Two times a day (BID) | ORAL | 0 refills | Status: DC | PRN

## 2019-03-16 ENCOUNTER — Emergency Department (HOSPITAL_COMMUNITY): Payer: Medicare Other

## 2019-03-16 ENCOUNTER — Other Ambulatory Visit: Payer: Self-pay

## 2019-03-16 ENCOUNTER — Emergency Department (HOSPITAL_COMMUNITY)
Admission: EM | Admit: 2019-03-16 | Discharge: 2019-03-30 | Disposition: E | Payer: Medicare Other | Attending: Emergency Medicine | Admitting: Emergency Medicine

## 2019-03-16 DIAGNOSIS — D649 Anemia, unspecified: Secondary | ICD-10-CM | POA: Diagnosis not present

## 2019-03-16 DIAGNOSIS — D591 Other autoimmune hemolytic anemias: Secondary | ICD-10-CM | POA: Insufficient documentation

## 2019-03-16 DIAGNOSIS — J449 Chronic obstructive pulmonary disease, unspecified: Secondary | ICD-10-CM | POA: Insufficient documentation

## 2019-03-16 DIAGNOSIS — Z79899 Other long term (current) drug therapy: Secondary | ICD-10-CM | POA: Insufficient documentation

## 2019-03-16 DIAGNOSIS — Z66 Do not resuscitate: Secondary | ICD-10-CM

## 2019-03-16 DIAGNOSIS — I4891 Unspecified atrial fibrillation: Secondary | ICD-10-CM | POA: Diagnosis not present

## 2019-03-16 DIAGNOSIS — R0603 Acute respiratory distress: Secondary | ICD-10-CM

## 2019-03-16 DIAGNOSIS — Z7982 Long term (current) use of aspirin: Secondary | ICD-10-CM | POA: Insufficient documentation

## 2019-03-16 DIAGNOSIS — Z20828 Contact with and (suspected) exposure to other viral communicable diseases: Secondary | ICD-10-CM | POA: Insufficient documentation

## 2019-03-16 DIAGNOSIS — D381 Neoplasm of uncertain behavior of trachea, bronchus and lung: Secondary | ICD-10-CM | POA: Diagnosis not present

## 2019-03-16 DIAGNOSIS — I10 Essential (primary) hypertension: Secondary | ICD-10-CM | POA: Insufficient documentation

## 2019-03-16 DIAGNOSIS — C3490 Malignant neoplasm of unspecified part of unspecified bronchus or lung: Secondary | ICD-10-CM

## 2019-03-16 DIAGNOSIS — C787 Secondary malignant neoplasm of liver and intrahepatic bile duct: Secondary | ICD-10-CM | POA: Insufficient documentation

## 2019-03-16 DIAGNOSIS — R0689 Other abnormalities of breathing: Secondary | ICD-10-CM | POA: Diagnosis not present

## 2019-03-16 DIAGNOSIS — I491 Atrial premature depolarization: Secondary | ICD-10-CM | POA: Diagnosis not present

## 2019-03-16 DIAGNOSIS — J189 Pneumonia, unspecified organism: Secondary | ICD-10-CM

## 2019-03-16 DIAGNOSIS — Z87891 Personal history of nicotine dependence: Secondary | ICD-10-CM | POA: Diagnosis not present

## 2019-03-16 DIAGNOSIS — I499 Cardiac arrhythmia, unspecified: Secondary | ICD-10-CM | POA: Diagnosis not present

## 2019-03-16 DIAGNOSIS — R0602 Shortness of breath: Secondary | ICD-10-CM | POA: Diagnosis not present

## 2019-03-16 LAB — CBC WITH DIFFERENTIAL/PLATELET
Abs Immature Granulocytes: 0.86 10*3/uL — ABNORMAL HIGH (ref 0.00–0.07)
Basophils Absolute: 0 10*3/uL (ref 0.0–0.1)
Basophils Relative: 0 %
Eosinophils Absolute: 0 10*3/uL (ref 0.0–0.5)
Eosinophils Relative: 0 %
HCT: 16.3 % — ABNORMAL LOW (ref 36.0–46.0)
Hemoglobin: 5.1 g/dL — CL (ref 12.0–15.0)
Immature Granulocytes: 5 %
Lymphocytes Relative: 16 %
Lymphs Abs: 2.5 10*3/uL (ref 0.7–4.0)
MCH: 32.9 pg (ref 26.0–34.0)
MCHC: 31.3 g/dL (ref 30.0–36.0)
MCV: 105.2 fL — ABNORMAL HIGH (ref 80.0–100.0)
Monocytes Absolute: 1.4 10*3/uL — ABNORMAL HIGH (ref 0.1–1.0)
Monocytes Relative: 8 %
Neutro Abs: 11.5 10*3/uL — ABNORMAL HIGH (ref 1.7–7.7)
Neutrophils Relative %: 71 %
Platelets: 84 10*3/uL — ABNORMAL LOW (ref 150–400)
RBC: 1.55 MIL/uL — ABNORMAL LOW (ref 3.87–5.11)
RDW: 20.1 % — ABNORMAL HIGH (ref 11.5–15.5)
WBC: 16.3 10*3/uL — ABNORMAL HIGH (ref 4.0–10.5)
nRBC: 1.5 % — ABNORMAL HIGH (ref 0.0–0.2)

## 2019-03-16 LAB — BASIC METABOLIC PANEL
Anion gap: 19 — ABNORMAL HIGH (ref 5–15)
BUN: 24 mg/dL — ABNORMAL HIGH (ref 8–23)
CO2: 18 mmol/L — ABNORMAL LOW (ref 22–32)
Calcium: 8.1 mg/dL — ABNORMAL LOW (ref 8.9–10.3)
Chloride: 93 mmol/L — ABNORMAL LOW (ref 98–111)
Creatinine, Ser: 0.93 mg/dL (ref 0.44–1.00)
GFR calc Af Amer: >60 mL/min (ref >60)
GFR calc non Af Amer: 59 mL/min — ABNORMAL LOW (ref >60)
Glucose, Bld: 199 mg/dL — ABNORMAL HIGH (ref 70–99)
Potassium: 4.2 mmol/L (ref 3.5–5.1)
Sodium: 130 mmol/L — ABNORMAL LOW (ref 135–145)

## 2019-03-16 LAB — SARS CORONAVIRUS 2 BY RT PCR (HOSPITAL ORDER, PERFORMED IN ~~LOC~~ HOSPITAL LAB): SARS Coronavirus 2: NEGATIVE

## 2019-03-16 LAB — BRAIN NATRIURETIC PEPTIDE: B Natriuretic Peptide: 268.9 pg/mL — ABNORMAL HIGH (ref 0.0–100.0)

## 2019-03-16 LAB — TYPE AND SCREEN
ABO/RH(D): O POS
Antibody Screen: NEGATIVE

## 2019-03-16 MED ORDER — SODIUM CHLORIDE 0.9 % IV SOLN: 500.0000 mg | Freq: Once | INTRAVENOUS | Status: DC

## 2019-03-16 MED ORDER — SODIUM CHLORIDE 0.9 % IV SOLN: 1.0000 g | Freq: Once | INTRAVENOUS | Status: DC

## 2019-03-16 MED ORDER — MORPHINE SULFATE (PF) 4 MG/ML IV SOLN
4.0000 mg | Freq: Once | INTRAVENOUS | Status: AC
  Administered 2019-03-16: 4 mg via INTRAVENOUS
  Filled 2019-03-16: qty 1

## 2019-03-16 MED ORDER — ALBUTEROL SULFATE HFA 108 (90 BASE) MCG/ACT IN AERS
4.0000 | INHALATION_SPRAY | Freq: Once | RESPIRATORY_TRACT | Status: AC
  Administered 2019-03-16: 18:00:00 4 via RESPIRATORY_TRACT
  Filled 2019-03-16: qty 6.7

## 2019-03-16 MED ORDER — SODIUM CHLORIDE 0.9 % IV BOLUS
500.0000 mL | Freq: Once | INTRAVENOUS | Status: AC
  Administered 2019-03-16: 19:00:00 500 mL via INTRAVENOUS

## 2019-03-16 MED ORDER — LORAZEPAM 2 MG/ML IJ SOLN
1.0000 mg | Freq: Once | INTRAMUSCULAR | Status: AC
  Administered 2019-03-16: 1 mg via INTRAVENOUS
  Filled 2019-03-16: qty 1

## 2019-03-16 MED ORDER — SODIUM CHLORIDE 0.9% FLUSH: 10.0000 mL | INTRAVENOUS | Status: DC | PRN

## 2019-03-16 MED ORDER — METHYLPREDNISOLONE SODIUM SUCC 125 MG IJ SOLR
125.0000 mg | Freq: Once | INTRAMUSCULAR | Status: AC
  Administered 2019-03-16: 125 mg via INTRAVENOUS
  Filled 2019-03-16: qty 2

## 2019-03-17 LAB — ABO/RH: ABO/RH(D): O POS

## 2019-03-18 ENCOUNTER — Ambulatory Visit: Payer: Medicare Other | Admitting: Nurse Practitioner

## 2019-03-19 ENCOUNTER — Inpatient Hospital Stay: Payer: Medicare Other

## 2019-03-19 ENCOUNTER — Inpatient Hospital Stay: Payer: Medicare Other | Admitting: Internal Medicine

## 2019-03-20 ENCOUNTER — Telehealth: Payer: Self-pay

## 2019-03-20 NOTE — Telephone Encounter (Signed)
error 

## 2019-03-26 ENCOUNTER — Other Ambulatory Visit: Payer: Medicare Other

## 2019-03-26 ENCOUNTER — Ambulatory Visit: Payer: Medicare Other

## 2019-03-30 NOTE — ED Triage Notes (Signed)
Change in pt condition ,BP lower ,resp less rapid .

## 2019-03-30 NOTE — ED Triage Notes (Signed)
PT is not responding to voice

## 2019-03-30 NOTE — ED Triage Notes (Signed)
PT from home with increased SHOB  Pt arrived on 6 liters Nasal o2. Pt mouth breathing and does not tol Face mask. Pt has DNR  And Port .

## 2019-03-30 NOTE — ED Provider Notes (Signed)
[facility] EMERGENCY DEPARTMENT Provider Note   CSN: [id number] Arrival date & time: 04-07-2019  1703    History   Chief Complaint Chief Complaint  Patient presents with   Shortness of Breath    HPI Donna Mcneil is a 78 y.o. 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 fib, hypertension, hyperlipidemia, GERD presents for evaluation of progressively worsening shortness of breath.  She is followed by Dr. Earlie Server with oncology currently undergoing systemic chemotherapy with gemcitabine 1000mg /M2 on days 1 and 8 every 3 weeks.  Had a recent blood transfusion on 03/06/2019.   Reports today feeling more short of breath than usual.  Denies chest pain, fever, cough, abdominal pain, nausea, or vomiting.  Has chronic peripheral edema reports this is not worse than usual, also has a wound to the right lower extremity that has been present for a few weeks for which she was started on a course of Keflex for 7 days on 02/26/2019 because at the time it appeared suspicious for cellulitis.  She tells me she has had decreased appetite over the last few days.  On EMS arrival she was increased from her usual home oxygen requirement of 4 L via nasal cannula up to 6 L via nasal cannula with some improvement.  On their monitor they noted runs of bigeminy and trigeminy and a few isolated runs of V. tach lasting for 3 beats only.  She is DNR.     The history is provided by the patient.    Past Medical History:  Diagnosis Date   COPD exacerbation ([place]) 09/01/2018   COVID-19 virus infection 11/28/2018   Diverticulitis    Hypercholesteremia    Hypertension    Lupus ([place])    Osteoporosis    Tobacco use     Patient Active Problem List   Diagnosis Date Noted   Antineoplastic chemotherapy induced anemia 02/26/2019   Cellulitis 02/26/2019   Hypokalemia 02/26/2019   Leukocytosis    Pressure  injury of skin 11/20/2018   Tachycardia    Acute on chronic diastolic CHF (congestive heart failure) ([place])    Atrial fibrillation with RVR ([place])    Acute respiratory failure with hypoxia ([place]) 11/19/2018   Port-A-Cath in place 10/24/2018   Adenocarcinoma of left lung, stage 4 ([place]) 09/26/2018   Goals of care, counseling/discussion 09/26/2018   Encounter for antineoplastic chemotherapy 09/26/2018   Community acquired pneumonia of left lower lobe of lung ([place])    Mass of upper lobe of left lung 09/02/2018   COPD (chronic obstructive pulmonary disease) ([place]) 09/01/2018   Chronic respiratory failure with hypoxia ([place]) 09/01/2018   Hyponatremia 09/01/2018   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   Rheumatoid arthritis involving multiple sites with positive rheumatoid factor ([place]) 12/12/2017   Tobacco use 04/14/2017   Carotid stenosis 04/14/2017   Peripheral neuropathy 06/24/2016   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   Chronic bronchitis ([place]) 11/13/2015   Osteoarthritis 10/05/2015   Hyperlipidemia 10/05/2015   Hypertension 10/05/2015   Osteoporosis 10/05/2015   Acid reflux 10/05/2015   Peptic ulcer 10/05/2015   Cataract 10/05/2015   Tendinitis 10/05/2015   Osteopenia 02/11/2014   Seropositive rheumatoid arthritis ([place]) 02/11/2014    Past Surgical History:  Procedure Laterality Date   ABDOMINAL HYSTERECTOMY  12/1969   APPENDECTOMY  1957   CATARACT EXTRACTION     CHOLECYSTECTOMY  1997   IR IMAGING GUIDED PORT INSERTION  10/16/2018   LAPAROSCOPIC SIGMOID COLECTOMY  2013   secondary to diverticulitis   spit seed removal     TONSILLECTOMY  1945     OB History   No obstetric history on file.      Home Medications    Prior to Admission medications   Medication Sig Start Date End Date Taking? Authorizing Provider  acetaminophen (TYLENOL) 500 MG tablet Take 1,000 mg by mouth every 6 (six) hours as  needed for mild pain.     [provider]  albuterol (PROVENTIL) (2.5 MG/3ML) 0.083% nebulizer solution Take 3 mLs (2.5 mg total) by nebulization every 4 (four) hours as needed for wheezing or shortness of breath. 10/11/18   [name], [name], PA  ALPRAZolam (XANAX) 1 MG tablet Take 1 tablet (1 mg total) by mouth 2 (two) times daily as needed for anxiety. 03/14/19   [name], [name], PA  amLODipine (NORVASC) 10 MG tablet Take 10 mg by mouth daily.    [provider]  apixaban (ELIQUIS) 2.5 MG TABS tablet Take 1 tablet (2.5 mg total) by mouth 2 (two) times daily. Patient not taking: Reported on 01/15/2019 12/01/18   Eugenie Filler, MD  aspirin EC 81 MG tablet Take 81 mg by mouth daily.    [provider]  atorvastatin (LIPITOR) 40 MG tablet TAKE 1 TABLET BY MOUTH ONCE DAILY Patient taking differently: Take 40 mg by mouth every evening.  05/14/18   [name], [name], PA  b complex vitamins tablet Take 2 tablets by mouth 2 (two) times daily.    [provider]  bisacodyl (DULCOLAX) 10 MG suppository Place 10 mg rectally as needed for moderate constipation.    [provider]  bismuth subsalicylate (PEPTO BISMOL) 262 MG/15ML suspension Take 30 mLs by mouth every 6 (six) hours as needed.    [provider]  Calcium Carb-Cholecalciferol (CALCIUM 600+D) 600-800 MG-UNIT TABS Take 1 tablet by mouth 2 (two) times a day.    [provider]  carboxymethylcellulose (REFRESH TEARS) 0.5 % SOLN Place 1 drop into both eyes daily as needed (dry eyes).     [provider]  cephALEXin (KEFLEX) 500 MG capsule Take 1 capsule (500 mg total) by mouth 4 (four) times daily. 02/26/19   Heilingoetter, Cassandra L, PA-C  cetirizine (ZYRTEC) 10 MG tablet Take 10 mg by mouth daily as needed for allergies.    [provider]  Coenzyme Q10 (COQ10) 100 MG CAPS Take 100 mg by mouth daily.    [provider]  dextromethorphan-guaiFENesin  (MUCINEX DM) 30-600 MG 12hr tablet Take 1 tablet by mouth 2 (two) times daily.    [provider]  docusate sodium (COLACE) 100 MG capsule Take 100 mg by mouth 2 (two) times daily.    [provider]  famotidine (PEPCID) 10 MG tablet Take 10 mg by mouth 2 (two) times daily.    [provider]  feeding supplement, ENSURE ENLIVE, (ENSURE ENLIVE) LIQD Take 237 mLs by mouth 2 (two) times daily between meals. 11/26/18   Hosie Poisson, MD  Ferrous Sulfate (SLOW FE) 142 (45 Fe) MG TBCR Take 1 tablet by mouth daily.    [provider]  flecainide (TAMBOCOR) 50 MG tablet Take 1 tablet (50 mg total) by mouth every 12 (twelve) hours. 12/01/18   Eugenie Filler, MD  fluticasone (FLONASE) 50 MCG/ACT nasal spray Place 2 sprays into both nostrils daily. 11/27/18   Hosie Poisson, MD  folic acid (FOLVITE) 1 MG  tablet Take 1 tablet (1 mg total) by mouth daily. 09/26/18   Curt Bears, MD  furosemide (LASIX) 20 MG tablet Take 1 tablet (20 mg total) by mouth 2 (two) times daily. Patient not taking: Reported on 01/15/2019 12/01/18   Eugenie Filler, MD  gabapentin (NEURONTIN) 100 MG capsule Take 100 mg by mouth 3 (three) times daily.  09/08/15   [provider]  hydrochlorothiazide (HYDRODIURIL) 50 MG tablet Take 50 mg by mouth daily.    [provider]  HYDROcodone-Acetaminophen (VICODIN) 5-300 MG TABS Take 1 tablet by mouth every 6 (six) hours as needed (severe pain).    [provider]  hydroxychloroquine (PLAQUENIL) 200 MG tablet Take 1 tablet by mouth daily. 11/13/18   [provider]  ipratropium (ATROVENT HFA) 17 MCG/ACT inhaler Inhale 2 puffs into the lungs 3 (three) times daily. 11/26/18   Hosie Poisson, MD  Krill Oil 500 MG CAPS Take 500 mg by mouth daily.    [provider]  loperamide (LOPERAMIDE A-D) 2 MG tablet Take 2 mg by mouth daily as needed for diarrhea or loose stools.    [provider]  Melatonin 3 MG TABS Take  3 mg by mouth at bedtime.    [provider]  methotrexate (RHEUMATREX) 2.5 MG tablet Take 6 tablets (15 mg total) by mouth every Monday. Hold it for one week and talk to your PCP before resuming it. 11/26/18   Hosie Poisson, MD  metoprolol tartrate (LOPRESSOR) 100 MG tablet Take 1 tablet (100 mg total) by mouth 2 (two) times daily. 12/01/18   Eugenie Filler, MD  Multiple Vitamins-Minerals (MULTIVITAMIN ADULT PO) Take 1 tablet by mouth daily.     [provider]  Omega-3 Fatty Acids (FISH OIL) 1200 MG CAPS Take 1 capsule by mouth 2 (two) times a day.    [provider]  ondansetron (ZOFRAN) 8 MG tablet Take 1 tablet (8 mg total) by mouth every 8 (eight) hours as needed for nausea or vomiting. 02/12/19   Curt Bears, MD  pantoprazole (PROTONIX) 40 MG tablet Take 1 tablet (40 mg total) by mouth daily at 6 (six) AM. Patient not taking: Reported on 01/15/2019 12/01/18   Eugenie Filler, MD  polyethylene glycol [place] / Floria Raveling) packet Take 17 g by mouth daily as needed for mild constipation. Patient not taking: Reported on 01/15/2019 11/26/18   Hosie Poisson, MD  potassium chloride 20 MEQ TBCR Take 40 mEq by mouth daily. 02/12/19   Curt Bears, MD  potassium chloride SA (K-DUR) 20 MEQ tablet Take 1 tablet (20 mEq total) by mouth 2 (two) times daily. 02/26/19   Heilingoetter, Cassandra L, PA-C  PROAIR HFA 108 (90 Base) MCG/ACT inhaler Inhale 2 puffs into the lungs every 6 (six) hours as needed for wheezing or shortness of breath. 09/27/18   [name], [name], PA  Probiotic Product (PROBIOTIC DAILY) CAPS Take 1 capsule by mouth 2 (two) times daily.    [provider]  prochlorperazine (COMPAZINE) 10 MG tablet Take 1 tablet (10 mg total) by mouth every 6 (six) hours as needed for nausea or vomiting. 01/24/19   Curt Bears, MD  rivaroxaban (XARELTO) 10 MG TABS tablet Take 10 mg by mouth daily. For A-fib    [provider]  Simethicone (GAS RELIEF 80  PO) Take by mouth.    [provider]  umeclidinium-vilanterol (ANORO ELLIPTA) 62.5-25 MCG/INH AEPB Inhale 1 puff into the lungs daily. 01/02/19   Martyn Ehrich, NP  Vitamin D, Cholecalciferol, 25 MCG (1000 UT) CAPS Take 1 capsule by mouth daily.    [provider]    Family History Family History  Problem Relation Age of Onset   Alcohol abuse Mother    Cirrhosis Mother    Heart disease Father    Hypertension Father    Gout Father    Ulcers Father    Hyperlipidemia Father    Alcohol abuse Brother    Heart disease Brother        MI at age 57   OCD Son    Prostatitis Paternal Grandfather    Pneumonia Paternal Grandfather    Cancer Son     Social History Social History   Tobacco Use   Smoking status: Former Smoker    Packs/day: 0.50    Types: Cigarettes    Quit date: 09/01/2018    Years since quitting: 0.5   Smokeless tobacco: Never Used   Tobacco comment: started smoking at age 32  Substance Use Topics   Alcohol use: No    Alcohol/week: 0.0 standard drinks   Drug use: No     Allergies   Codeine, Cyclosporine, Other, Pregabalin, and Zolpidem   Review of Systems Review of Systems  Constitutional: Negative for chills and fever.  Respiratory: Positive for shortness of breath. Negative for cough.   Cardiovascular: Positive for leg swelling (chronic, unchanged). Negative for chest pain.  Gastrointestinal: Negative for abdominal pain, nausea and vomiting.  Skin: Positive for wound.  All other systems reviewed and are negative.    Physical Exam Updated Vital Signs BP (!) 52/37    Pulse (!) 47    Temp 97.6 F (36.4 C) (Axillary)    Resp (!) 0    Ht 5\' 2"  (1.575 m)    Wt 57.6 kg    SpO2 100%    BMI 23.24 kg/m   Physical Exam Vitals signs and nursing note reviewed.  Constitutional:      Appearance: She is well-developed. She is ill-appearing.     Comments: Thin, Chronically ill in appearance  HENT:     Head: Normocephalic  and atraumatic.  Eyes:     General:        Right eye: No discharge.        Left eye: No discharge.     Conjunctiva/sclera: Conjunctivae normal.  Neck:     Vascular: No JVD.     Trachea: No tracheal deviation.  Cardiovascular:     Rate and Rhythm: Tachycardia present. Rhythm irregular.     Comments: 2+ radial pulses bilaterally.  1+ DP/PT pulses bilaterally.  There is 2+ pitting edema of the bilateral lower extremities which patient reports is chronic and unchanged. Pulmonary:     Effort: Tachypnea and accessory muscle usage present.     Breath sounds: Decreased breath sounds present.     Comments: Port-A-Cath of the left side of the chest with no erythema or induration.  Globally diminished breath sounds, scattered rhonchi Abdominal:     General: Bowel sounds are normal. There is no distension.     Palpations: Abdomen is soft.     Tenderness: There is abdominal tenderness in the periumbilical area. There is no guarding or rebound.  Musculoskeletal:     Right lower leg: She exhibits no tenderness. Edema present.     Left lower leg: She exhibits no tenderness. Edema present.  Skin:    General: Skin is warm and dry.     Findings: No erythema.  Comments: Ulcerations to the dorsum of the right foot which patient reports has been present for several weeks.  No abnormal drainage or surrounding erythema or induration.  Neurological:     Mental Status: She is alert.  Psychiatric:        Behavior: Behavior normal.      ED Treatments / Results  Labs (all labs ordered are listed, but only abnormal results are displayed) Labs Reviewed  BASIC METABOLIC PANEL - Abnormal; Notable for the following components:      Result Value   Sodium 130 (*)    Chloride 93 (*)    CO2 18 (*)    Glucose, Bld 199 (*)    BUN 24 (*)    Calcium 8.1 (*)    GFR calc non Af Amer 59 (*)    Anion gap 19 (*)    All other components within normal limits  CBC WITH DIFFERENTIAL/PLATELET - Abnormal; Notable  for the following components:   WBC 16.3 (*)    RBC 1.55 (*)    Hemoglobin 5.1 (*)    HCT 16.3 (*)    MCV 105.2 (*)    RDW 20.1 (*)    Platelets 84 (*)    nRBC 1.5 (*)    Neutro Abs 11.5 (*)    Monocytes Absolute 1.4 (*)    Abs Immature Granulocytes 0.86 (*)    All other components within normal limits  BRAIN NATRIURETIC PEPTIDE - Abnormal; Notable for the following components:   B Natriuretic Peptide 268.9 (*)    All other components within normal limits  SARS CORONAVIRUS 2 (HOSPITAL ORDER, [place] [facility])  I-STAT CHEM 8, ED  POC OCCULT BLOOD, ED  TYPE AND SCREEN  ABO/RH    EKG None  Radiology Dg Chest Port 1 View  Result Date: April 07, 2019 CLINICAL DATA:  Increase shortness-of-breath today. History of stage IV lung cancer. EXAM: PORTABLE CHEST 1 VIEW COMPARISON:  11/29/2018 and CT 01/22/2019 FINDINGS: Patient rotated to the right. Right IJ Port-A-Cath unchanged. Lungs are adequately inflated demonstrate chronic bibasilar opacification with slight improvement in left base. Likely small effusions with associated bibasilar atelectasis. Stable left apical pleural thickening. Slight increased interstitial prominence of the left apex. Mild stable cardiomegaly. Remainder of the exam is unchanged. IMPRESSION: Slight increased interstitial prominence over the left apex which could be seen with an inflammatory or infectious process and less likely lymphangitic spread of patient's known lung cancer. Chronic bibasilar opacification with improvement in the left base likely small effusions with associated atelectasis. Mild stable cardiomegaly.  Right sided Port-A-Cath unchanged. Electronically Signed   By: Marin Olp M.D.   On: 2019-04-07 17:46    Procedures .Critical Care Performed by: Renita Papa, PA-C Authorized by: Renita Papa, PA-C   Critical care provider statement:    Critical care time (minutes):  50   Critical care was necessary to treat or prevent  imminent or life-threatening deterioration of the following conditions:  Respiratory failure and circulatory failure   Critical care was time spent personally by me on the following activities:  Discussions with consultants, evaluation of patient's response to treatment, examination of patient, ordering and performing treatments and interventions, ordering and review of laboratory studies, ordering and review of radiographic studies, pulse oximetry, re-evaluation of patient's condition, obtaining history from patient or surrogate and review of old charts   (including critical care time)  Medications Ordered in ED Medications  sodium chloride flush (NS) 0.9 % injection 10-40 mL (has  no administration in time range)  albuterol (VENTOLIN HFA) 108 (90 Base) MCG/ACT inhaler 4 puff (4 puffs Inhalation Given 04/06/19 1740)  methylPREDNISolone sodium succinate (SOLU-MEDROL) 125 mg/2 mL injection 125 mg (125 mg Intravenous Given 04-06-2019 1803)  sodium chloride 0.9 % bolus 500 mL (0 mLs Intravenous Stopped 04-06-19 2044)  morphine 4 MG/ML injection 4 mg (4 mg Intravenous Given 04/06/19 1917)  LORazepam (ATIVAN) injection 1 mg (1 mg Intravenous Given 2019-04-06 1918)     Initial Impression / Assessment and Plan / ED Course  I have reviewed the triage vital signs and the nursing notes.  Pertinent labs & imaging results that were available during my care of the patient were reviewed by me and considered in my medical decision making (see chart for details).        Patient presenting for evaluation of worsening shortness of breath today.  She has terminal lung cancer and is DNR.  She is afebrile, hypotensive with an irregular heart rhythm.  She is chronically ill in appearance, requiring increased supplemental oxygen.  She had some improvement with albuterol and Solu-Medrol.  Unable to nebulize in the midst of the COVID-19 pandemic.  Will obtain chest x-ray blood work, EKG, and reassess.  Lab work shows  leukocytosis of 16.3, decreased bicarb of 18 and elevated anion gap of 19.  Concerning for possible lactic acidosis and hyperventilation due to respiratory distress.  Her chest x-ray shows increased interstitial prominence over the left apex which could be seen with an inflammatory or infectious process.  Less likely lymphangitic spread of the patient's known lung cancer.  Given leukocytosis and symptoms, will give IV antibiotics for community-acquired pneumonia.  Also of note, patient's hemoglobin is decreased at 5.1, down from 7.6 on 03-26-2019.  She did undergo dead blood transfusion on 03-27-2019.  7:00PM Went in to reassess patient and obtain hemoccult to assess for possible GI bleed.  Patient is pale, tachycardic, agonal respirations noted.  She is no longer conversational, does not respond to painful stimuli.  Peripheral pulses are weak and thready.  Given her severe anemia in the setting of terminal illness and known DNR status, will make patient comfort care. Will give morphine and ativan IV.  Dr. Gilford Raid has contacted patient's caregiver who will contact family. Family has confirmed DNR status and patient's wishes.   8:10PM Patient pulseless, apneic, monitor reads asystole. Time of Death called by Dr. Gilford Raid.   8:14 PM I spoke with Rojelio Brenner, patient coordinator at [facility] (patient's PCP).   8:29 PM Spoke with Dr. Caryn Section at [facility] who agrees to sign death certificate.    Final Clinical Impressions(s) / ED Diagnoses   Final diagnoses:  Non-small cell carcinoma of lung, stage 4, unspecified laterality ([place])  Symptomatic anemia  Acute respiratory distress  Cardiac arrhythmia, unspecified cardiac arrhythmia type  Community acquired pneumonia of left upper lobe of lung [facility])    ED Discharge Orders    None       Debroah Baller 03/17/19 0011    Isla Pence, MD 03/17/19 2252

## 2019-03-30 NOTE — ED Triage Notes (Addendum)
PT has DNR\ at bed side

## 2019-03-30 NOTE — ED Notes (Signed)
ED Provider at bedside. 

## 2019-03-30 NOTE — ED Triage Notes (Signed)
Pt using non-re breather alternating  With nasal O2.

## 2019-03-30 NOTE — ED Notes (Signed)
Pt. Has port-a-cath

## 2019-03-30 NOTE — ED Notes (Signed)
Pt's belongings place on the body bag with the pt, pt's pants, glasses and cell phone.

## 2019-03-30 DEATH — deceased

## 2019-08-10 IMAGING — CT NM PET TUM IMG INITIAL (PI) SKULL BASE T - THIGH
8 series · 25 of 25 positions shown · non-contrast
Comparison: None.

CLINICAL DATA: Initial treatment strategy for LEFT lung cancer.
Malignant effusion..

EXAM:
NUCLEAR MEDICINE PET SKULL BASE TO THIGH
TECHNIQUE: 7.24 mCi F-18 FDG was injected intravenously. Full-ring PET imaging
was performed from the skull base to thigh after the radiotracer. CT
data was obtained and used for attenuation correction and anatomic
localization.
Fasting blood glucose: 102 mg/dl

[Series 3: pet sk_thigh ac · axial · 5.0mm · 4.07mm/px · z∈[-771,+53]mm · 4 of 207 slices shown]
[im 1/207]
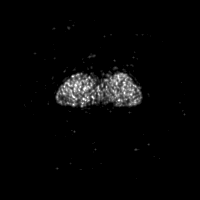
[im 69/207]
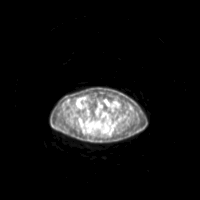
[im 138/207]
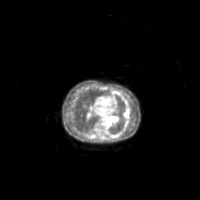
[im 207/207]
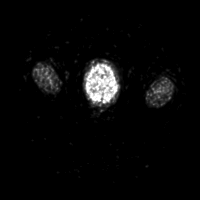

[Series 4: ct sk_thigh 5.0 b31f · axial · 5.0mm · 0.98mm/px · z∈[-771,+53]mm · 5 of 207 slices shown]
[im 1/207]
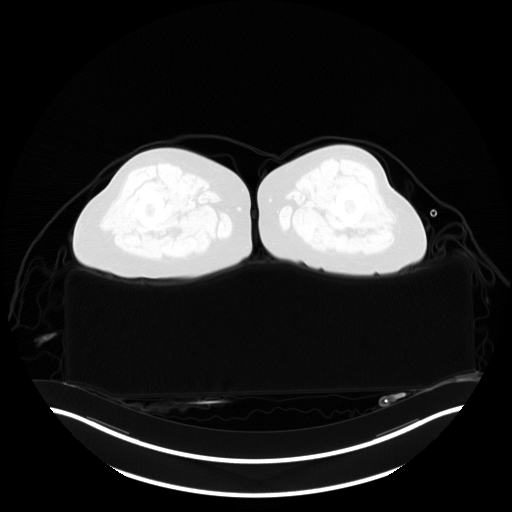
[im 52/207]
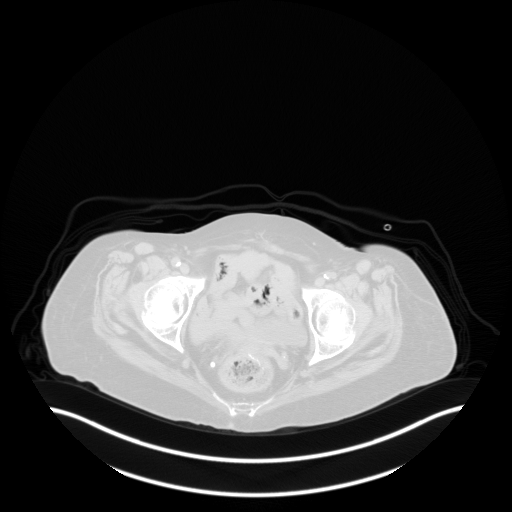
[im 104/207]
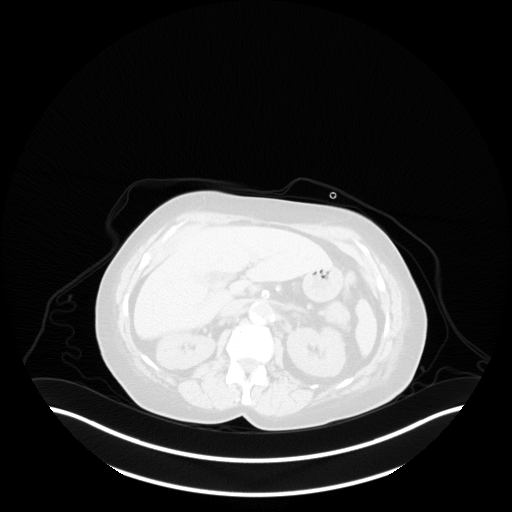
[im 155/207]
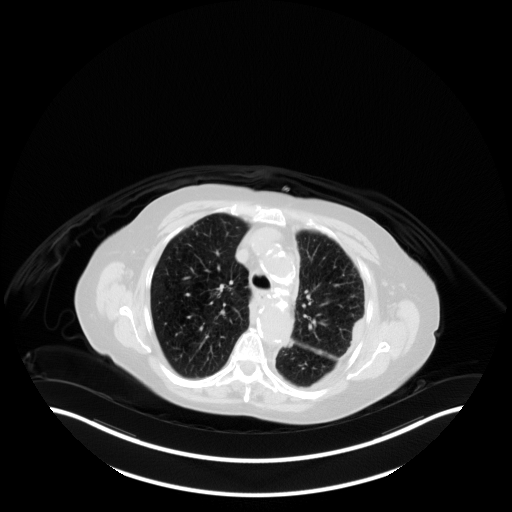
[im 207/207  brain]
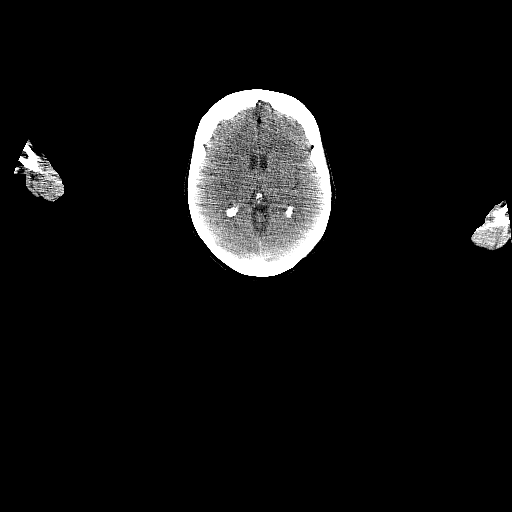

[Series 5: pet sk_thigh nac · axial · 5.0mm · 4.07mm/px · z∈[-771,+53]mm · 5 of 207 slices shown]
[im 1/207]
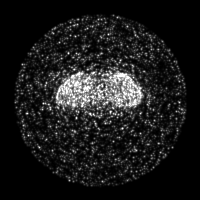
[im 52/207]
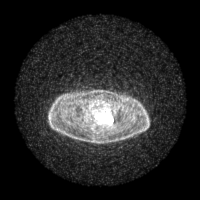
[im 104/207]
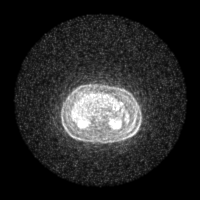
[im 155/207]
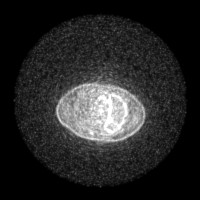
[im 207/207]
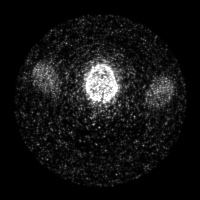

[Series 8: ct sk_thigh 5.0 b70f lung_bone// · axial · 5.0mm · 0.58mm/px · z∈[-333,-85]mm · 2 of 63 slices shown]
[im 1/63  bone]
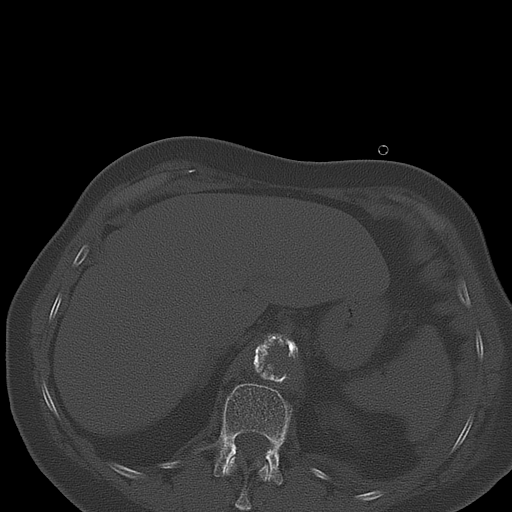
[im 63/63  bone]
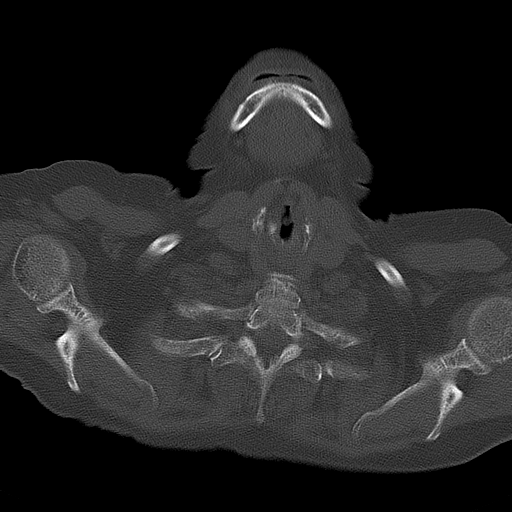

[Series 603: mip range · coronal · 1.71mm/px · 1 of 32 slices shown]
[im 1/32]
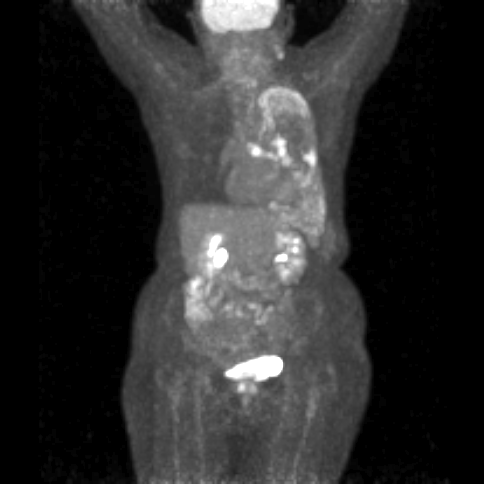

[Series 604: range-ct sk_thigh 5.0 (id)<alpha range> · 2 of 76 slices shown (1 of 2)]
[im 1/76]
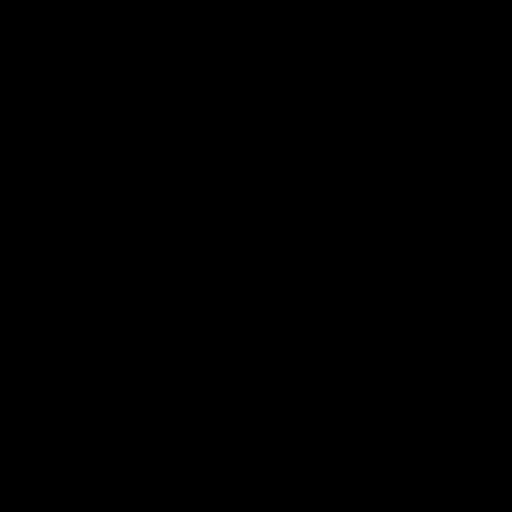
[im 76/76]
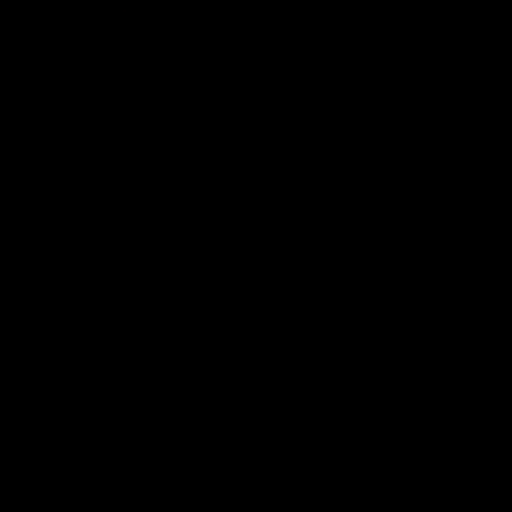

[Series 605: range-ct sk_thigh 5.0 (id)<alpha range> · 5 of 202 slices shown (2 of 2)]
[im 1/202]
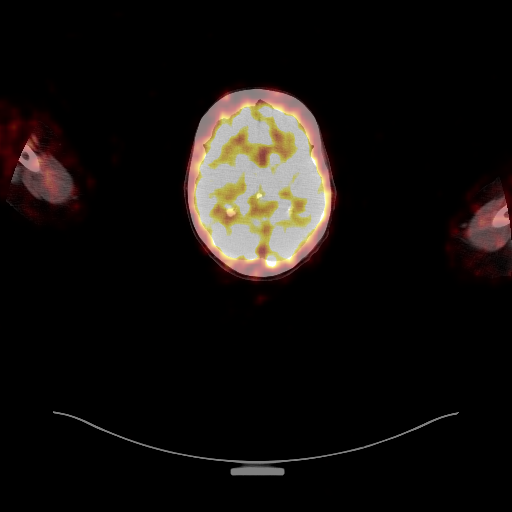
[im 51/202]
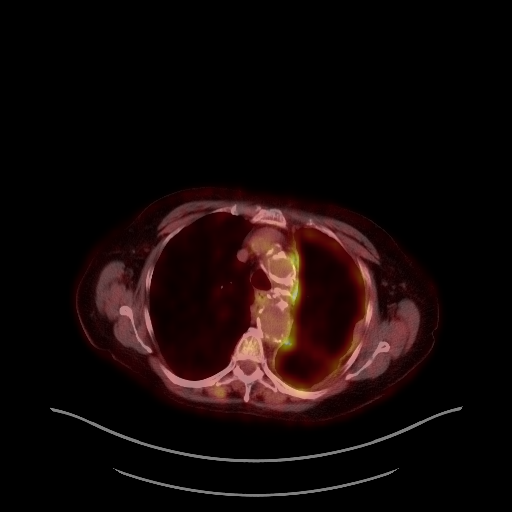
[im 101/202]
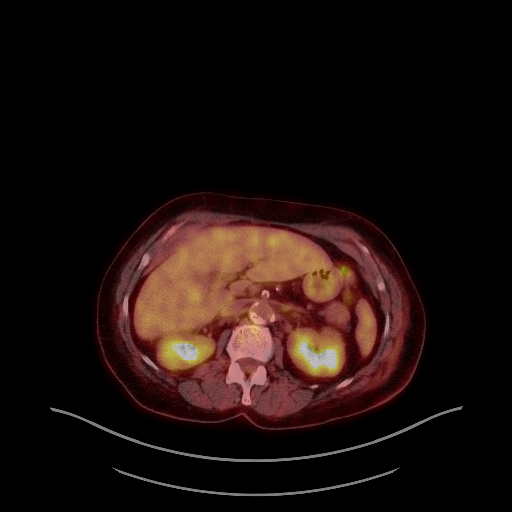
[im 151/202]
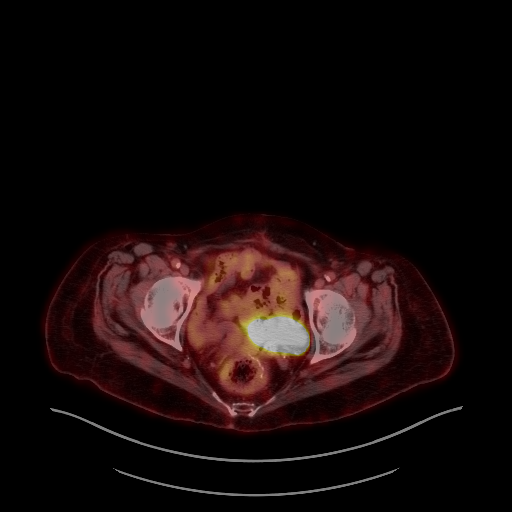
[im 202/202]
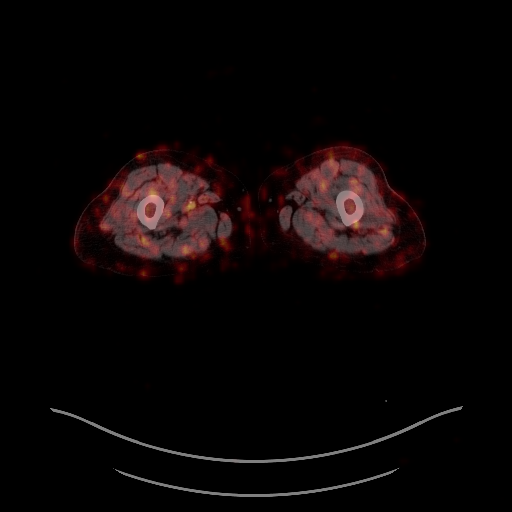

[Series 1070: results mm oncology reading · 1.0mm · 1.00mm/px · 1 of 6 slices shown]
[im 1/6]
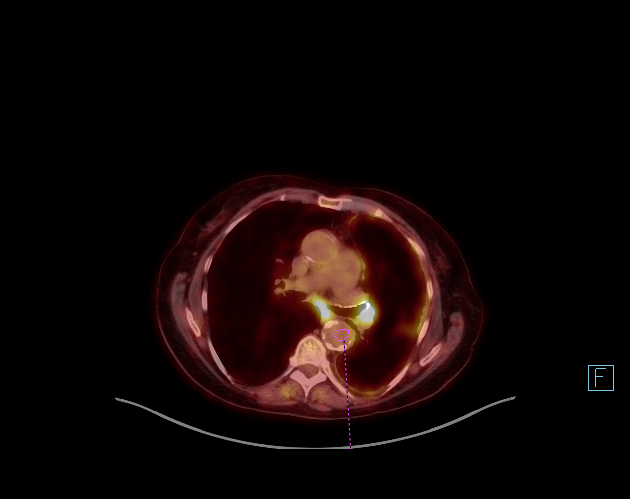

[25 of 25 positions shown; findings below may reference images not displayed]

FINDINGS: Mediastinal blood pool activity: SUV max

NECK: Small nodule in the posterior aspect of LEFT parotid gland SUV
max equal 5.0. This nodule measures 9 mm on image [DATE].

Incidental CT findings: none

CHEST: There is a rind of hypermetabolic activity involving the near
entirety of the LEFT pleural space. This activity is most intense at
the base and apex. For example hypermetabolic activity of the LEFT
apical pleura with SUV max equal 8.2.

Along the lateral margin the LEFT upper lobe, anterior to the
fissure, there is a discrete hypermetabolic nodule measuring 1.7 cm
with intense metabolic activity (SUV max equal 11.1).

There is a hypermetabolic LEFT hilar lymph node with SUV max equal
12.3. Additional hypermetabolic subcarinal node which is
hypermetabolic with SUV max equal

No hypermetabolic supraclavicular nodes. No hypermetabolic pulmonary
nodules on the RIGHT.

Incidental CT findings: Coronary artery calcification and aortic
atherosclerotic calcification.

ABDOMEN/PELVIS: No abnormal hypermetabolic activity within the
liver, pancreas, adrenal glands, or spleen. No hypermetabolic lymph
nodes in the abdomen or pelvis.

Low-density collection along the sigmoid colon with peripheral
calcifications (image 148/4 ) does not have metabolic activity.

Incidental CT findings: none

SKELETON: No focal hypermetabolic activity to suggest skeletal
metastasis.

Incidental CT findings: none
IMPRESSION: 1. Hypermetabolic nodule in the lateral aspect of the LEFT upper
lobe consists with primary bronchogenic carcinoma.
2. Ipsilateral hilar hypermetabolic nodal metastasis and subcarinal
mediastinal nodal metastasis.
3. Diffuse metabolic activity associated with the LEFT pleural
surface is most concerning for diffuse pleural metastasis in the
LEFT hemithorax.
4. No evidence of metastatic disease outside of the LEFT hemithorax
or mediastinum.
5. Small hypermetabolic nodule in the posterior aspect of the LEFT
parotid gland is most consistent with a primary parotid neoplasm
(benign or malignant). Consider ENT consultation at some point
following lung cancer evaluation / treatment.
